# Patient Record
Sex: Male | Born: 1940 | Race: Black or African American | Hispanic: No | Marital: Married | Smoking: Former smoker
Health system: Southern US, Community
[De-identification: ages and names within clinical notes are randomized; demographics above are authoritative.]

## PROBLEM LIST (undated history)

## (undated) DIAGNOSIS — I480 Paroxysmal atrial fibrillation: Secondary | ICD-10-CM

## (undated) DIAGNOSIS — I714 Abdominal aortic aneurysm, without rupture, unspecified: Secondary | ICD-10-CM

## (undated) DIAGNOSIS — I10 Essential (primary) hypertension: Secondary | ICD-10-CM

## (undated) DIAGNOSIS — Z720 Tobacco use: Secondary | ICD-10-CM

## (undated) DIAGNOSIS — I499 Cardiac arrhythmia, unspecified: Secondary | ICD-10-CM

## (undated) DIAGNOSIS — G4733 Obstructive sleep apnea (adult) (pediatric): Secondary | ICD-10-CM

## (undated) DIAGNOSIS — I2510 Atherosclerotic heart disease of native coronary artery without angina pectoris: Secondary | ICD-10-CM

## (undated) DIAGNOSIS — E785 Hyperlipidemia, unspecified: Secondary | ICD-10-CM

## (undated) DIAGNOSIS — M1990 Unspecified osteoarthritis, unspecified site: Secondary | ICD-10-CM

## (undated) HISTORY — DX: Abdominal aortic aneurysm, without rupture: I71.4

## (undated) HISTORY — DX: Hyperlipidemia, unspecified: E78.5

## (undated) HISTORY — DX: Atherosclerotic heart disease of native coronary artery without angina pectoris: I25.10

## (undated) HISTORY — DX: Essential (primary) hypertension: I10

## (undated) HISTORY — DX: Abdominal aortic aneurysm, without rupture, unspecified: I71.40

## (undated) HISTORY — PX: COLONOSCOPY: SHX174

## (undated) HISTORY — DX: Obstructive sleep apnea (adult) (pediatric): G47.33

---

## 2000-03-17 ENCOUNTER — Encounter: Payer: Self-pay | Admitting: Emergency Medicine

## 2000-03-17 ENCOUNTER — Emergency Department (HOSPITAL_COMMUNITY): Admission: EM | Admit: 2000-03-17 | Discharge: 2000-03-17 | Payer: Self-pay | Admitting: Emergency Medicine

## 2001-09-22 ENCOUNTER — Encounter: Payer: Self-pay | Admitting: Emergency Medicine

## 2001-09-22 ENCOUNTER — Emergency Department (HOSPITAL_COMMUNITY): Admission: EM | Admit: 2001-09-22 | Discharge: 2001-09-22 | Payer: Self-pay | Admitting: Emergency Medicine

## 2001-12-12 HISTORY — PX: KNEE ARTHROSCOPY: SUR90

## 2002-11-25 ENCOUNTER — Encounter: Payer: Self-pay | Admitting: Internal Medicine

## 2002-11-25 ENCOUNTER — Encounter: Admission: RE | Admit: 2002-11-25 | Discharge: 2002-11-25 | Payer: Self-pay | Admitting: Internal Medicine

## 2003-06-18 ENCOUNTER — Ambulatory Visit (HOSPITAL_COMMUNITY): Admission: RE | Admit: 2003-06-18 | Discharge: 2003-06-18 | Payer: Self-pay | Admitting: *Deleted

## 2003-06-18 ENCOUNTER — Encounter (INDEPENDENT_AMBULATORY_CARE_PROVIDER_SITE_OTHER): Payer: Self-pay | Admitting: Specialist

## 2007-11-24 ENCOUNTER — Emergency Department (HOSPITAL_COMMUNITY): Admission: EM | Admit: 2007-11-24 | Discharge: 2007-11-24 | Payer: Self-pay | Admitting: Emergency Medicine

## 2007-11-30 ENCOUNTER — Encounter: Admission: RE | Admit: 2007-11-30 | Discharge: 2007-11-30 | Payer: Self-pay | Admitting: Neurology

## 2008-04-09 ENCOUNTER — Encounter: Admission: RE | Admit: 2008-04-09 | Discharge: 2008-04-09 | Payer: Self-pay | Admitting: Internal Medicine

## 2009-01-27 ENCOUNTER — Ambulatory Visit: Payer: Self-pay | Admitting: Vascular Surgery

## 2009-02-03 ENCOUNTER — Encounter: Payer: Self-pay | Admitting: Cardiovascular Disease

## 2009-02-09 ENCOUNTER — Inpatient Hospital Stay (HOSPITAL_BASED_OUTPATIENT_CLINIC_OR_DEPARTMENT_OTHER): Admission: RE | Admit: 2009-02-09 | Discharge: 2009-02-09 | Payer: Self-pay | Admitting: Cardiovascular Disease

## 2009-02-10 ENCOUNTER — Ambulatory Visit: Payer: Self-pay | Admitting: Vascular Surgery

## 2009-02-19 ENCOUNTER — Inpatient Hospital Stay (HOSPITAL_COMMUNITY): Admission: RE | Admit: 2009-02-19 | Discharge: 2009-02-20 | Payer: Self-pay | Admitting: Vascular Surgery

## 2009-02-19 HISTORY — PX: ABDOMINAL AORTIC ANEURYSM REPAIR: SUR1152

## 2009-02-20 ENCOUNTER — Ambulatory Visit: Payer: Self-pay | Admitting: Vascular Surgery

## 2009-02-20 ENCOUNTER — Encounter: Payer: Self-pay | Admitting: Vascular Surgery

## 2009-02-26 ENCOUNTER — Ambulatory Visit: Payer: Self-pay | Admitting: Vascular Surgery

## 2009-03-17 ENCOUNTER — Encounter: Admission: RE | Admit: 2009-03-17 | Discharge: 2009-03-17 | Payer: Self-pay | Admitting: Vascular Surgery

## 2009-03-23 ENCOUNTER — Ambulatory Visit: Payer: Self-pay | Admitting: Vascular Surgery

## 2009-05-27 ENCOUNTER — Encounter: Admission: RE | Admit: 2009-05-27 | Discharge: 2009-05-27 | Payer: Self-pay | Admitting: Internal Medicine

## 2009-09-22 ENCOUNTER — Ambulatory Visit: Payer: Self-pay | Admitting: Vascular Surgery

## 2010-04-06 ENCOUNTER — Ambulatory Visit: Payer: Self-pay | Admitting: Vascular Surgery

## 2010-04-06 ENCOUNTER — Encounter: Admission: RE | Admit: 2010-04-06 | Discharge: 2010-04-06 | Payer: Self-pay | Admitting: Vascular Surgery

## 2011-01-02 ENCOUNTER — Encounter: Payer: Self-pay | Admitting: Vascular Surgery

## 2011-03-15 ENCOUNTER — Encounter (INDEPENDENT_AMBULATORY_CARE_PROVIDER_SITE_OTHER): Payer: 59

## 2011-03-15 ENCOUNTER — Ambulatory Visit (INDEPENDENT_AMBULATORY_CARE_PROVIDER_SITE_OTHER): Payer: 59 | Admitting: Vascular Surgery

## 2011-03-15 DIAGNOSIS — I714 Abdominal aortic aneurysm, without rupture, unspecified: Secondary | ICD-10-CM

## 2011-03-15 DIAGNOSIS — Z48812 Encounter for surgical aftercare following surgery on the circulatory system: Secondary | ICD-10-CM

## 2011-03-15 NOTE — Assessment & Plan Note (Signed)
OFFICE VISIT  Rodney [name], Rodney [name] DOB:  [birth year]                                       03/15/2011 IEPPI#:[id number]  Patient returns for his annual checkup regarding his previous aortic stent graft repair from an abdominal aortic aneurysm performed by me in 2010 with a Gore endograft.  He has done very well with no complications or specific problems.  He denies any abdominal or back symptoms.  He also denies any active cardiac symptoms such as chest pain, dyspnea on exertion, PND, or orthopnea.  CHRONIC MEDICAL PROBLEMS: 1. Hypertension. 2. Hyperlipidemia. 3. Coronary artery disease with minor blockage previously followed by     Dr. Elease Hashimoto following a cardiac cath but no intervention has been     necessary. 4. Negative for diabetes, COPD, or stroke.  SOCIAL HISTORY:  Married, has 1 child.  Continues to work at [employer].  Smokes a 1/2 pack to 1 pack of cigarettes per day for 50 years.  Does not use alcohol.  REVIEW OF SYSTEMS:  Positive for occasional anorexia.  He denies chest pain, dyspnea on exertion, PND, orthopnea.  No claudication.  All other systems in the complete review of systems are negative.  PHYSICAL EXAMINATION:  Blood pressure 151/93, heart rate 60, respirations 20.  General:  He is a well-developed and well-nourished male in no apparent distress, alert and oriented x3.  HEENT:  Normal for age.  EOMs intact.  Lungs:  Clear to auscultation.  No rhonchi or wheezing.  Cardiovascular:  Regular rhythm.  No murmurs.  Carotid pulses 3+.  No audible bruits.  Abdomen:  Soft, nontender.  No pulsatile mass noted.  Lower extremity exam reveals 3+ femoral and 2+ dorsalis pedis pulses bilaterally.  Musculoskeletal exam is free of major deformities.  Today I reviewed a duplex scan of his endograft, which I ordered and interpreted.  The size of the aortic sac is unchanged from 1 year ago. There is no evidence of any endoleak or  migration.  I have reassured him regarding these findings.  Will see him back in 1 year.  He does have a questionable contrast allergy, so we will continue to follow this with duplex scanning in the office on an annual basis.    [name] Hart Rochester, M.D. Electronically Signed  JDL/MEDQ  D:  03/15/2011  T:  03/15/2011  Job:  [id number]

## 2011-03-22 NOTE — Procedures (Unsigned)
VASCULAR LAB EXAM  INDICATION:  Follow up AAA endograft, placed 02/19/2009  HISTORY: Diabetes:  No. Cardiac:  No. Hypertension:  Yes.  EXAM: 1. AAA sac size 3.97 cm AP, 4.38 cm transverse. 2. Previous sac size 09/22/2009 4.1 cm AP, 5.02 cm transverse.  IMPRESSION: 1. The aorta and endograft appear patent. 2. No significant change in size of the aneurysmal sac surrounding the     endograft. 3. No evidence of endoleak was detected.  ___________________________________________ [name]. Hart Rochester, M.D.  EM/MEDQ  D:  03/15/2011  T:  03/15/2011  Job:  [id number]

## 2011-03-24 LAB — BASIC METABOLIC PANEL
GFR calc non Af Amer: >60 mL/min (ref >60)
Potassium: 3.8 mEq/L (ref 3.5–5.1)
Sodium: 136 mEq/L (ref 135–145)

## 2011-03-24 LAB — CROSSMATCH: Antibody Screen: NEGATIVE

## 2011-03-24 LAB — COMPREHENSIVE METABOLIC PANEL
Albumin: 3.9 g/dL (ref 3.5–5.2)
Alkaline Phosphatase: 72 U/L (ref 39–117)
BUN: 13 mg/dL (ref 6–23)
CO2: 23 mEq/L (ref 19–32)
Chloride: 110 mEq/L (ref 96–112)
GFR calc non Af Amer: >60 mL/min (ref >60)
Glucose, Bld: 106 mg/dL — ABNORMAL HIGH (ref 70–99)
Potassium: 4.3 mEq/L (ref 3.5–5.1)
Total Bilirubin: 0.6 mg/dL (ref 0.3–1.2)

## 2011-03-24 LAB — CBC
HCT: 38.9 % — ABNORMAL LOW (ref 39.0–52.0)
HCT: 46.3 % (ref 39.0–52.0)
Hemoglobin: 13.9 g/dL (ref 13.0–17.0)
Hemoglobin: 16.4 g/dL (ref 13.0–17.0)
MCV: 94.9 fL (ref 78.0–100.0)
RBC: 4.88 MIL/uL (ref 4.22–5.81)
WBC: 4.5 10*3/uL (ref 4.0–10.5)
WBC: 6.9 10*3/uL (ref 4.0–10.5)

## 2011-03-24 LAB — URINALYSIS, ROUTINE W REFLEX MICROSCOPIC
Bilirubin Urine: NEGATIVE
Hgb urine dipstick: NEGATIVE
Ketones, ur: NEGATIVE mg/dL
Specific Gravity, Urine: 1.019 (ref 1.005–1.030)
Urobilinogen, UA: 1 mg/dL (ref 0.0–1.0)

## 2011-03-24 LAB — BLOOD GAS, ARTERIAL
Acid-base deficit: 1.3 mmol/L (ref 0.0–2.0)
FIO2: 0.21 %
TCO2: 23.7 mmol/L (ref 0–100)
pO2, Arterial: 81.3 mmHg (ref 80.0–100.0)

## 2011-03-24 LAB — PROTIME-INR
INR: 1 (ref 0.00–1.49)
Prothrombin Time: 13.1 seconds (ref 11.6–15.2)

## 2011-03-24 LAB — ABO/RH: ABO/RH(D): A NEG

## 2011-04-26 NOTE — Assessment & Plan Note (Signed)
OFFICE VISIT   Rodney [name], Rodney [name]  DOB:  1941/06/12                                       09/22/2009  ZOXWR#:[id number]   The patient returns for continued followup regarding his aortic stent  graft placed in infrarenal abdominal aneurysm March of 2010.  This was  placed in both common iliac arteries.  He did not have a CT scan today  because he does have itching when he obtains contrast material although  he has never had a true allergic reaction with a rash.  His last CT scan  done 1 month post insertion of the graft was also a noncontrast CT.   Today we performed a duplex scan in the office.  There was no evidence  of any endo leak on the duplex scan and the stent graft is in excellent  position with a maximum diameter being 5 cm x 4.1 cm.  This is similar  to a study done 6 months ago.   On examination today his blood pressure is 138/75, heart rate 57,  respirations 14.  Carotid pulses are 3+, no audible bruits.  Neurologic  exam is normal.  Abdomen soft, nontender with no pulsatile mass noted.  He has 3+ femoral and popliteal pulses bilaterally.  Well-perfused lower  extremities.  ABIs 0.92 on the left and 0.74 on the right.   He was reassured regarding these findings and we will see him back in 6  months with a noncontrast CT scan.  Following that we will alternate  years performing duplex scan and CT scans without contrast.   [name]. Hart Rochester, M.D.  Electronically Signed   JDL/MEDQ  D:  09/22/2009  T:  09/23/2009  Job:  [id number]

## 2011-04-26 NOTE — H&P (Signed)
NAME:  DANNER, PAULDING NO.:  [id number]   MEDICAL RECORD NO.:  [id number]            PATIENT TYPE:   LOCATION:                                 FACILITY:   PHYSICIAN:  Vesta Mixer, M.D. DATE OF BIRTH:  July 11, 1941   DATE OF ADMISSION:  DATE OF DISCHARGE:                              HISTORY & PHYSICAL   Rodney [name] is a 70 year old gentleman with a history of hypertension,  hypercholesterolemia, and abdominal aortic aneurysm.  He is now referred  for heart catheterization after having an abnormal Cardiolite study.   Rodney [name] has a history of hypertension and hyperlipidemia.  He has been  having some chest discomfort, but typically this is after he eats.  He  has also been having some pain that radiates up to the left side of his  neck.  He has intermittent episodes of chest discomfort without any  specific precipitating factors.  These pains last 3-4 minutes.  They are  not associated with shortness breath, nausea, vomiting, or diaphoresis.  He denies any syncope or presyncope.  He denies any PND or orthopnea.   He was recently seen by Dr. Hart Rochester and was found to have an abdominal  aortic aneurysm and we are asked to see him for preoperative evaluation.  He had a stress Cardiolite study which was abnormal.   His current medications are:  1. Procardia XL 30 mg a day.  2. Crestor 10 mg a day.  3. Aspirin 81 mg a day.  4. Multivitamin once a day.  5. Omega-3 fatty acids once a day.   ALLERGIES:  None.   PAST MEDICAL HISTORY:  1. Hypercholesterolemia.  2. Hypertension.  3. Abdominal aortic aneurysm.   SOCIAL HISTORY:  The patient works in [place].  He smokes 1 pack a day  for 40 years.  He plays golf and walks occasionally.  He drinks a beer  every day.   FAMILY HISTORY:  His father died at age 40 due to prostate cancer.  His  mother is 53 years old and has Alzheimer disease.   His review of systems is reviewed in the history of present illness.  All other systems were reviewed and are negative.   PHYSICAL EXAMINATION:  GENERAL:  He is a middle-aged gentleman in no  acute distress.  He is alert and oriented x3 and his mood and affect are  normal.  VITAL SIGNS:  His weight is 204.  Blood pressure is 128/74 with a heart  rate of 68.  HEENT:  2+ carotids.  He has no bruits, no JVD, no thyromegaly.  NECK:  Supple.  BACK:  Nontender.  LUNGS:  Clear.  HEART:  Regular rate.  S1 and S2.  His PMI is nondisplaced.  His chest  wall is nontender.  ABDOMEN:  Good bowel sounds.  He does have a pulsatile mass.  I did not  hear any bruits.  There is no hepatosplenomegaly.  He has no guarding or  rebound.  EXTREMITIES:  He has no clubbing, cyanosis, or edema.  His pulses are  2+.  SKIN:  Warm and dry without rashes.  NEURO:  Cranial nerves II-XII are intact, and his motor and sensory  function are intact.  His gait is normal.   His EKG reveals normal sinus rhythm.  He has voltage for left  ventricular hypertrophy.  He has nonspecific ST and T wave changes.   A stress Cardiolite study reveals inferior defect consistent with  possible inferior scar.  He also has mild to moderate left ventricular  dysfunction.   Rodney [name] presents with a history of peripheral vascular disease.  He has  moderately reduced left ventricular systolic function.  We have  discussed the heart catheterization.  We have discussed the risks,  benefits, and options of heart catheterization.  He understands and  agrees to proceed.      Vesta Mixer, M.D.  Electronically Signed     PJN/MEDQ  D:  02/05/2009  T:  02/06/2009  Job:  [id number]   cc:   Antony Madura, M.D.  [name] Hart Rochester, M.D.

## 2011-04-26 NOTE — Op Note (Signed)
NAME:  FRANCK, VINAL NO.:  [id number]   MEDICAL RECORD NO.:  [id number]          PATIENT TYPE:  INP   LOCATION:  3308                         FACILITY:  [facility]   PHYSICIAN:  Juleen China IV, MDDATE OF BIRTH:  1941-06-29   DATE OF PROCEDURE:  02/19/2009  DATE OF DISCHARGE:                               OPERATIVE REPORT   PREOPERATIVE DIAGNOSIS:  Abdominal aortic aneurysm.   POSTOPERATIVE DIAGNOSIS:  Abdominal aortic aneurysm.   PROCEDURE PERFORMED:  Endovascular aneurysm repair, right femoral artery  exposure.   ANESTHESIA:  General.   BLOOD LOSS:  Minimal.   FINDINGS:  Complete exclusion.   INDICATIONS:  This is a 70 year old gentleman with infrarenal abdominal  aortic aneurysm who comes in today for stent graft placement.  Please  see Dr. Candie Chroman note for full details of the procedure.  This will be  operative note for the right femoral artery dissection.   PROCEDURE:  The patient was identified in the holding and taken to room  9.  He was placed supine on the table.  The patient was prepped and  draped in standard sterile fashion.  General endotracheal anesthesia was  administered.  A time-out was called.  Antibiotics were given.  Again,  this is a dictation for the right femoral artery exposure.  The inguinal  ligament was identified by bony landmarks.  An oblique incision was made  over the palpable pulse of the common femoral artery below the inguinal  ligament.  Cautery was used to dissect the subcutaneous tissue.  Femoral  sheath was identified and opened sharply.  The right femoral artery was  mobilized up to the level of inguinal ligament and down to the  bifurcation.  Vessel loops were placed proximally and distally.   Please see Dr. Candie Chroman note for details of the aneurysm repair.   Once the aneurysm repair had been completed, the sheaths and wires were  removed.  A Henley clamp was placed proximally on the right common  femoral artery  and a baby Earl Lites was placed distally.  The artery was  irrigated with heparinized saline.  There was a large plaque which I  made sure that I packed out with closure of the arteriotomy which was  done with a 5-0 Prolene in running fashion.  The artery was flushed in  antegrade and retrograde fashion prior to completion of the arteriotomy  closure.  The patient's heparin was reversed with protamine.   The groin was irrigated.  Hemostasis was achieved.  The femoral sheath  was reapproximated with 2-0 Vicryl, subcutaneous tissue was closed with  2-0 and 3-0 Vicryl, and skin was closed with a 4-0 Vicryl.  The patient  tolerated the procedure well, and there were no complications.           ______________________________  V. Charlena Cross, MD  Electronically Signed     VWB/MEDQ  D:  02/19/2009  T:  02/20/2009  Job:  [id number]

## 2011-04-26 NOTE — Assessment & Plan Note (Signed)
OFFICE VISIT   MARSH, HECKLER  DOB:  08/27/1941                                       02/10/2009  EAVWU#:[id number]   The patient returns today for further discussion regarding his abdominal  aortic aneurysm.  He had a cardiac cath performed by Dr. Kristeen Miss  yesterday and I discussed this with Dr. Elease Hashimoto.  He has some occlusive  disease in his ramus intermedius branch which may eventually require  PTCA and stenting.  It is Dr. Harvie Bridge opinion that this should not  place him in jeopardy during his aortic stent graft procedure and he  feels and I agree that we should proceed with the aortic stent graft.  Dr. Elease Hashimoto will then see the patient back in 2 months for further  followup.  The CT scan has been reviewed and we feel that he is a good  candidate for aortic stent grafting and will proceed with that on  Thursday, March 11 at [facility].  Blood pressure today is 150/80,  heart rate 62.  Abdomen soft, nontender with a prominent pulsatile mass.  He has 3+ femoral, popliteal and dorsalis pedis pulses bilaterally.  His  questions were answered and he would like to proceed.  Risks were  discussed.   [name] Hart Rochester, M.D.  Electronically Signed   JDL/MEDQ  D:  02/10/2009  T:  02/11/2009  Job:  [id number]

## 2011-04-26 NOTE — Procedures (Signed)
ENDOVASCULAR STENT GRAFT EXAM   INDICATION:  Followup endo stent.   HISTORY:  Abdominal aortic aneurysm.                           DUPLEX EVALUATION   AAA Sac Size:                 4.1 CM AP             5.02 CM TRV  Previous Sac Size:            5.0 CM AP             4.7 CM TRV  Evidence of an endoleak?      No                    No   Velocity Criteria:  Proximal Aorta                60 cm/sec  Proximal Stent Graft          39 cm/sec  Main Body Stent Graft-Mid     65 cm/sec  Right Limb-Proximal           76 cm/sec  Right Limb-Distal             71 cm/sec  Left Limb-Proximal            81 cm/sec  Left Limb-Distal              90 cm/sec  Patent Renal Arteries?        Yes                   Yes   IMPRESSION:  1. No evidence of endo leak.  2. Patent endo stent with no evidence of focal stenosis.   ___________________________________________  [name] Hart Rochester, M.D.   MG/MEDQ  D:  09/23/2009  T:  09/23/2009  Job:  [id number]

## 2011-04-26 NOTE — Op Note (Signed)
NAME:  ABDULMALIK, Rodney [name] NO.:  [id number]   MEDICAL RECORD NO.:  [id number]          PATIENT TYPE:  INP   LOCATION:  3308                         FACILITY:  [facility]   PHYSICIAN:  [name]. Hart Rochester, M.D.  DATE OF BIRTH:  September 09, 1941   DATE OF PROCEDURE:  02/19/2009  DATE OF DISCHARGE:                               OPERATIVE REPORT   PREOPERATIVE DIAGNOSIS:  Infrarenal abdominal aortic aneurysm.   POSTOPERATIVE DIAGNOSIS:  Infrarenal abdominal aortic aneurysm.   OPERATION:  Insertion of aorto-bi-common iliac Gore - Excluder stent  graft for abdominal aortic aneurysm via bilateral common femoral artery  approach.  1. Left femoral artery exposure by Dr. Hart Rochester, right femoral artery      exposure by Dr. Myra Gianotti will be dictated by him with insertion of;      a.     28.5 x 14 x 16 cm main body via left.      b.     20 mm x 11.5 cm contralateral limb via the right.      c.     Abdominal aortogram and pelvic angiogram with completion       arteriogram.   SURGEONS:  [name]. Hart Rochester, MD and V. Durene Cal IV, MD   ANESTHESIA:  General endotracheal.   PROCEDURE:  The patient was taken to the operating room and placed in  supine position at which time satisfactory general endotracheal  anesthesia was administered.  Abdomen and groins were prepped with  Betadine scrub and solution and draped in routine sterile manner.  Suprainguinal oblique incisions were made bilaterally in the common  superficial and profunda femoris arteries, dissected free.  Both had  excellent pulses.  The left common femoral artery was entered initially  with a short 8-French sheath passed over a Bentson wire and a pigtail  catheter inserted proximal to the renal arteries where abdominal  aortogram and pelvic angiogram was obtained to localize the level of the  renal arteries and to determine the length necessary for the graft.  Right femoral artery had also been entered with a long 8 sheath being  inserted.  After completing the angiogram, a 28.5 x 14 mm x 16 cm graft  was selected and inserted via the left femoral site.  It was then  deployed after localizing the level of the renal arteries.  Following  deployment of this, the contralateral gate was cannulated via the right  common femoral approach using a Motarjeme catheter and a Glidewire.  When this had been completed, retrograde angiogram performed through the  right iliac artery to determine the length on the right and a 20 mm x  11.5 cm (bell bottom) graft was deployed to the distal right common  iliac artery without difficulty.  All of the junctions were then dilated  with a CODA balloon catheter and a completion angiogram was performed  through the pigtail catheter, revealing no evidence of the Endo leak and  good position of the graft.  Following this, sheaths and catheters were  all removed over guidewires.  Both femoral arteries were repaired with 6-  0 Prolene, Dr. Myra Gianotti on the right and Dr. Hart Rochester on the left,  following  adequate hemostasis and administration of protamine to reverse the  heparin.  The wounds were closed in layers with Vicryl in subcuticular  fashion.  Sterile dressing was applied.  The patient was taken to  recovery room in satisfactory condition.      [name] Hart Rochester, M.D.  Electronically Signed     JDL/MEDQ  D:  02/19/2009  T:  02/19/2009  Job:  [id number]

## 2011-04-26 NOTE — Assessment & Plan Note (Signed)
OFFICE VISIT   TYLEEK, SMICK  DOB:  1941-04-25                                       03/23/2009  [id number]#:[id number]   Mr. Rodney [name] is 1 month status post endovascular repair of an infrarenal  abdominal aortic aneurysm using a Gore excluder graft.  He has done very  well with no complications and no specific complaints.  He has not  required any prescription pain medication and is resuming his normal  activities.  He is scheduled to return to work next week.  He has had no  abdominal or back symptoms.  Appetite has returned to normal.   PHYSICAL EXAM:  Vital signs:  Today blood pressure is 130/76, heart rate  68, respirations 14.  His inguinal incisions have healed nicely.  He has excellent femoral and  popliteal pulses bilaterally with well-perfused lower extremities.  An  ABI of 0.8 on the right and 1.0 on the left.  No pulsatile masses noted in his abdomen.   CT angiogram performed today was reviewed by me and there is no evidence  of any endoleak in the graft.  It has not migrated or changed positions.  Aneurysm is approximately 5 cm in diameter and not pulsatile.   I have reassured him regarding these findings and he will continue to  take one aspirin per day and return to see me in 6 months with a follow-  up CT angiogram at that time unless he has any problems in the interim.   [name] Hart Rochester, M.D.  Electronically Signed   JDL/MEDQ  D:  03/23/2009  T:  03/24/2009  Job:  [id number]   cc:   Antony Madura, M.D.  Maretta Bees. Vonita Moss, M.D.  Vesta Mixer, M.D.

## 2011-04-26 NOTE — Assessment & Plan Note (Signed)
OFFICE VISIT   Rodney [name], Rodney [name]  DOB:  12-29-1940                                       04/06/2010  [id number]#:[id number]   The patient returns for continued followup regarding his aortic stent  graft repair which I performed in March of 2010 for an infrarenal  abdominal aortic aneurysm.  He has an aortobicommon iliac Gore excluder  graft.  He does have a questionable allergy to contrast therefore his CT  scans are done without contrast because of itching which occurs.  He has  had no abdominal or back symptoms since I last saw him 6 months ago.  He  denies any chest pain, dyspnea on exertion, PND, orthopnea, claudication  symptoms or other specific problems.   SOCIAL HISTORY:  He is married, has one child, works for [employer] and smokes half pack of cigarettes per day.   PHYSICAL EXAMINATION:  Vital signs:  Blood pressure 171/99, heart rate  63, respirations 18.  General:  He is a well-developed, well-nourished  male who is alert and oriented x3.  No apparent distress.  Chest:  Clear  to auscultation.  Abdomen:  Soft, nontender with no pulsatile mass  noted.  He has 3+ femoral and dorsalis pedis pulses bilaterally with no  evidence of ischemia.   Today I ordered a CT scan noncontrast which I reviewed and it does  reveal that the aneurysm sac continues to contract down and now measures  4 cm in maximum diameter.  No evidence of migration or endo leak or  fracture.  This is smaller than the size last noted on the duplex scan 6  months ago.  He will return in 1 year with duplex scan in our office and  we will alternate annual CT scans with duplex scans to continue to  monitor this aneurysm sac.     [name] Hart Rochester, M.D.  Electronically Signed   JDL/MEDQ  D:  04/06/2010  T:  04/07/2010  Job:  [id number]

## 2011-04-26 NOTE — Discharge Summary (Signed)
NAME:  Rodney [name], Rodney [name] NO.:  [id number]   MEDICAL RECORD NO.:  [id number]          PATIENT TYPE:  INP   LOCATION:  3308                         FACILITY:  [facility]   PHYSICIAN:  [name]. Hart Rochester, M.D.  DATE OF BIRTH:  1941/12/07   DATE OF ADMISSION:  02/19/2009  DATE OF DISCHARGE:  02/20/2009                               DISCHARGE SUMMARY   FINAL DISCHARGE DIAGNOSES:  1. Abdominal aortic aneurysm.  2. Hypertension.  3. Hyperlipidemia.  4. History of gout.   PROCEDURES PERFORMED:  Endovascular repair of a aortic abdominal  aneurysm using a Gore excluder stent graft, main body 28.5 x 14 x 16  with a contralateral leg of 20 mm x 11.5 cm.   COMPLICATIONS:  None.   DISCHARGE MEDICATIONS:  He is instructed to resume all preoperative  medications consisting of:  1. Crestor 10 mg p.o. q.a.m.  2. Multivitamin with iron p.o. daily.  3. Aspirin 81 mg p.o. daily.  4. Procardia XL 30 mg p.o. daily.  5. He is given a prescription for Percocet 5/325 one p.o. q.4 h.      p.r.n. pain, total number of 20 were given.   DISPOSITION:  He is being discharged home in stable condition with his  wounds healing well.  He is given careful instructions regarding the  care of his wounds and his activity level.  He is given an appointment  to see Dr.  Hart Rochester in 4 weeks with CTA of his abdomen and pelvis with  stent graft protocol and ABIs.  The office will arrange visits.   BRIEF IDENTIFYING STATEMENT:  For complete details, please refer to the  typed history and physical.  Briefly, this is a very pleasant 70-year-  old man who was referred to Dr. Hart Rochester with an infrarenal abdominal  aortic aneurysm measuring 5 cm in maximum diameter.  Dr. Hart Rochester  evaluated him and found him to be a suitable candidate for endovascular  repair.  He was informed of the risks and benefits of the procedure and  after careful consideration, he elected to proceed with surgery.   HOSPITAL COURSE:   Preoperative workup was completed as an outpatient.  He was brought in through same day of surgery and underwent the  aforementioned repair of his abdominal aortic aneurysm.  For complete  details, please refer the typed operative report.  Procedure was without  complications.  He was returned to the postanesthesia care unit  extubated.  Following stabilization, he was transferred to a surgical  step-down unit.  He was observed overnight.  On the following morning, he was found to be stable.  His wounds are  healing well.  At this time, his A-lines and cordis were removed.  His  Foley was removed.  Following ambulation and voiding trial, he was  discharged home in stable condition.      Wilmon Arms, PA      [name] Hart Rochester, M.D.  Electronically Signed    KEL/MEDQ  D:  02/20/2009  T:  02/20/2009  Job:  [id number]   cc:   [name]. Hart Rochester,  M.D. 

## 2011-04-26 NOTE — H&P (Signed)
HISTORY AND PHYSICAL EXAMINATION   January 27, 2009   Re:  Rodney [name], Rodney [name]                DOB:  02/07/1941   CHIEF COMPLAINT:  Infrarenal abdominal aortic aneurysm.   HISTORY OF PRESENT ILLNESS:  This healthy 70 year old active male  patient was found to have microscopic hematuria and was evaluated by Dr.  Vonita Moss with a CT of the abdomen and pelvis.  The hematuria resolved  and he was found to have an infrarenal abdominal aortic aneurysm  measuring 5 cm in maximum diameter and referred for further evaluation.  After discussion he was scheduled for repair of the aneurysm after  cardiac evaluation.   PAST MEDICAL HISTORY:  1. Hypertension.  2. Hyperlipidemia.  3. Gout.  4. Negative for diabetes, coronary artery disease, stroke.   PAST SURGICAL HISTORY:  Left knee arthroscopy.   FAMILY HISTORY:  Positive for prostate cancer in his father at an  elderly age, diabetes in his sister.  Negative for coronary artery  disease and stroke.   SOCIAL HISTORY:  He is married and continues to work at [employer].  He smokes a pack of cigarettes per day and has done so for 50+  years.  He drinks an occasional beer.   REVIEW OF SYSTEMS:  Positive for weight loss.  He has never had history  of chest pain, dyspnea on exertion, PND, orthopnea, claudication.  No GI  or GU symptoms at the present time.  Occasional headaches.   ALLERGIES:  None known.   MEDICATIONS:  1. Crestor 10 mg daily.  2. Procardia XL 30 mg daily.  3. Aspirin 81 mg daily.  4. Men's 50 Plus Advantage one daily.  5. Omega-3 1000 mg one daily.   PHYSICAL EXAMINATION:  Vital signs:  Blood pressure 146/84, heart rate  67, respirations 14.  General:  He is alert and oriented x3.  Neck:  Supple.  3+ carotid pulses palpable.  No bruits are audible.  Neurologic:  Normal.  No palpable adenopathy in the neck.  Chest:  Clear  to auscultation.  Cardiovascular:  Regular rhythm.  No murmurs.  Abdomen:   Soft, nontender with a prominent pulsatile mass.  He has 3+  femoral, popliteal and dorsalis pedis pulses bilaterally.   IMPRESSION:  Infrarenal abdominal aortic aneurysm.  The plan is to admit  the patient on March 4 for stent graft repair of this infrarenal aortic  aneurysm.  He will undergo a Cardiolite exam prior to admission for  cardiac clearance.  Risks and benefits of the procedure have been  thoroughly discussed with the patient and he would like to proceed.   [name] Hart Rochester, M.D.  Electronically Signed   JDL/MEDQ  D:  01/27/2009  T:  01/28/2009  Job:  [id number]   cc:   Maretta Bees. Vonita Moss, M.D.  Antony Madura, M.D.

## 2011-04-26 NOTE — Cardiovascular Report (Signed)
NAME:  Rodney, [name] NO.:  [id number]   MEDICAL RECORD NO.:  [id number]           PATIENT TYPE:   LOCATION:                                 FACILITY:   PHYSICIAN:  Rodney [name], M.D. DATE OF BIRTH:  [birth year]   DATE OF PROCEDURE:  DATE OF DISCHARGE:                            CARDIAC CATHETERIZATION   Rodney [name] is a 70 year old gentleman with a history of hypertension,  hypercholesterolemia, and an abdominal aortic aneurysm.  He was recently  referred to the office for stress Cardiolite study, which was found to  be abnormal.  He is referred for heart catheterization for further  evaluation of that.   The procedure was left heart catheterization with coronary angiography  and left ventriculogram.  He also had an ascending aorta angiogram  because of the dilated aortic root.   The right femoral artery was easily cannulated using a modified  Seldinger technique.   HEMODYNAMICS:  LV pressure is 142/16 with an aortic pressure of 139/76.   ANGIOGRAPHY:  Left main:  The left main is fairly normal.   The left anterior descending artery has some proximal luminal  irregularities between 20 and 30%.  The mid vessel stenosis around 30-  40%.  It gives off several small diagonal arteries.  The first diagonal  artery is moderate in size and has mild irregularities.  The second  diagonal artery is very tiny and has an 80% proximal stenosis.  This  vessel is quite small and is not a candidate for PTCA.   The remaining LAD has only minor luminal irregularities.   The left circumflex artery is a large vessel.  It gives off a very high  obtuse marginal artery.  The remaining circumflex artery continues  around the A-V groove and supplies a posterolateral branch.   The first obtuse marginal artery is subtotally occluded at 2 different  sites.  There was sluggish TIMI grade 1 flow through this vessel.  This  vessel reaches around the lateral wall and supplies a  small amount of  the inferior left ventricle.  This was quite likely where the site of  the abnormal Cardiolite.   The right coronary artery is large and dominant.  There are minor  luminal irregularities.  The posterior descending artery and the  posterolateral segment artery, they taper fairly quickly at the takeoff.  This taper improved slightly with intracoronary nitroglycerin.  There  are only minor luminal irregularities between 20 and 30%.   The left ventriculogram was performed in a 30-RAO position.  It reveals  a moderately dilated and hypocontractile left ventricle.  The ejection  fraction is probably 40%.   During this left ventriculogram, we noticed that he had a very dilated  aortic root.  We performed an aortic root angiography, which revealed a  dilated aortic root.  There was no evidence of aortic insufficiency.   COMPLICATIONS:  None.   CONCLUSIONS:  1. Single-vessel coronary artery disease, primarily involving a tight      obtuse marginal artery #1.  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  He is basically asymptomatic, and I do not think that      this will present any problems for his aortic aneurysm repair.  I      have discussed the case with Dr. Hart [name] and felt that Rodney [name] is      probably a very good candidate to have an aortic stent graft.  He      may have a slight rise in his cardiac enzymes if he does have to go      under general anesthetic, but I do not think that it will cause      significant worsening of his heart failure.  Once he has had his      aneurysm successfully treated, we can consider going in and trying      to stent this if he has any other symptoms.  We will continue with      risk factor modification.      Rodney [name], M.D.  Electronically Signed     PJN/MEDQ  D:  02/09/2009  T:  02/09/2009  Job:  [id number]   cc:   Rodney [name]. Rodney [name], M.D.  Rodney [name], M.D.

## 2011-04-29 NOTE — Consult Note (Signed)
[place]  Patient:    Rodney [name], Rodney [name] Visit Number: [id number] MRN: [id number]          Service Type: EMS Location: [place] Attending Physician:  Doug Sou Dictated by:   Aundra Dubin, M.D. Proc. Date: 09/22/01 Admit Date:  09/22/2001   CC:         Antony Madura, M.D.   Consultation Report  CHIEF COMPLAINT:  Shortness of breath.  HISTORY OF PRESENT ILLNESS:  Mr. Rodney [name] is a 70 year old black male who is a patient of Dr. Antony Madura who is a long-term smoker.  This afternoon, with slight exertion, he became short of breath.  He had some "discomfort" to his chest but he said there was no pain.  There may have been a slight degree of nausea.  This cleared in a couple of minutes.  His family members wanted him seen in the emergency room.  So, he came to the emergency room.  He generally has been doing well.  His wife says his energy level has been the same.  He has had some of these episodes of discomfort with slight burning to the chest over the last six months.  He generally smokes a pack of cigarettes a day and does not drink alcohol.  On further review of systems, there has been no edema but his feet have felt slightly tight occasionally.  Todays episodes, he had no radiation to the neck or arms.  There was no diaphoresis.  He is not diabetic.  Other review of systems finds there was no joint swelling, rashes, fever, or cough.  PAST MEDICAL/SURGICAL HISTORY:  Hypertension.  Left knee surgery.  No recent hospitalization.  MEDICATIONS: 1. Procardia, questionable mg q.d. 2. Aspirin 325 mg q.d.  FAMILY HISTORY:  Noncontributory.  SOCIAL HISTORY:  He is here with his wife.  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  He received a business degree at [employer].  He was in the [employer] for 10 years.  He began smoking at about age 12.  He does not drink alcohol.  PHYSICAL  EXAMINATION:  VITAL SIGNS:  Blood pressure 166/87, pulse 62, respirations 20.  Blood pressure later was 154/84 and 143/78.  GENERAL:  He is in no distress.  SKIN:  Dry.  HEENT:  PERRL.  EOMI.  Mouth clear.  NECK:  Negative JVD.  LUNGS:  Clear.  HEART:  Regular.  No murmur.  ABDOMEN:  Negative HSM nontender.  MUSCULOSKELETAL:  The hands, wrists, elbows, shoulders, knees, ankles, and feet and had no swelling.  There is no lower extremity edema.  NEUROLOGICAL:  Nonfocal.  LABORATORY DATA:  His CPK is 195 (24-195).  The troponin was 0.  The EKG showed sinus bradycardia and some slight nonspecific T-wave changes. Review of his chest x-ray, which was clear, and there was no change from previous x-rays.  Other labs showed a WBC 4.9, hemoglobin 15.3.  Glucose 101, creatinine 1.1, albumin 4.0, AST 29.  ASSESSMENT/PLAN:  Possible angina:  I believe he needs a cardiac workup.  I do believe that he is stable to go home.  I will have him take an aspirin tonight and give him a prescription for sublingual nitroglycerin.  I am also placing him on metoprolol 25 mg q.d. in addition to his Procardia.  He will contact Dr. Antony Madura on September 24, 2001.  He will likely need a stress test. If he  has further difficulties, he will return to the emergency room. Dictated by:   Aundra Dubin, M.D. Attending Physician:  Doug Sou DD:  09/22/01 TD:  09/23/01 Job: [id number]

## 2011-08-02 ENCOUNTER — Other Ambulatory Visit: Payer: Self-pay | Admitting: Internal Medicine

## 2011-08-02 ENCOUNTER — Ambulatory Visit
Admission: RE | Admit: 2011-08-02 | Discharge: 2011-08-02 | Disposition: A | Discharge status: Home / Self Care | Payer: 59 | Source: Ambulatory Visit | Attending: Internal Medicine | Admitting: Internal Medicine

## 2011-08-02 DIAGNOSIS — M25519 Pain in unspecified shoulder: Secondary | ICD-10-CM

## 2011-09-19 LAB — CBC
HCT: 44.3
Hemoglobin: 15.5
MCHC: 35
MCV: 94.3
RDW: 12.9

## 2011-09-19 LAB — DIFFERENTIAL
Basophils Absolute: 0
Basophils Relative: 1
Eosinophils Relative: 2
Monocytes Absolute: 0.3

## 2011-09-19 LAB — I-STAT 8, (EC8 V) (CONVERTED LAB)
BUN: 13
Bicarbonate: 27.1 — ABNORMAL HIGH
Glucose, Bld: 83
pCO2, Ven: 49.2
pH, Ven: 7.349 — ABNORMAL HIGH

## 2012-03-14 ENCOUNTER — Encounter: Payer: Self-pay | Admitting: Vascular Surgery

## 2012-03-19 ENCOUNTER — Encounter: Payer: Self-pay | Admitting: Neurosurgery

## 2012-03-20 ENCOUNTER — Ambulatory Visit: Payer: 59 | Admitting: Neurosurgery

## 2012-07-02 ENCOUNTER — Encounter: Payer: Self-pay | Admitting: Vascular Surgery

## 2012-07-03 ENCOUNTER — Ambulatory Visit (INDEPENDENT_AMBULATORY_CARE_PROVIDER_SITE_OTHER): Payer: Medicare Other | Admitting: Vascular Surgery

## 2012-07-03 ENCOUNTER — Encounter: Payer: Self-pay | Admitting: Vascular Surgery

## 2012-07-03 VITALS — BP 150/88 | HR 64 | Temp 98.1°F | Ht 74.0 in | Wt 206.0 lb

## 2012-07-03 DIAGNOSIS — I714 Abdominal aortic aneurysm, without rupture, unspecified: Secondary | ICD-10-CM

## 2012-07-03 DIAGNOSIS — Z48812 Encounter for surgical aftercare following surgery on the circulatory system: Secondary | ICD-10-CM

## 2012-07-03 DIAGNOSIS — Z8679 Personal history of other diseases of the circulatory system: Secondary | ICD-10-CM | POA: Insufficient documentation

## 2012-07-03 NOTE — Addendum Note (Signed)
Addended by: Sharee Pimple on: 07/03/2012 11:06 AM   Modules accepted: Orders

## 2012-07-03 NOTE — Progress Notes (Signed)
Subjective:     Patient ID: Rodney [name], male   DOB: 25-Jan-1941, 71 y.o.   MRN: [id number]  HPI this 71 year old male returns for continued followup regarding his aorto by common iliac stent graft performed in 2010 by me. He has done well with no abdominal or back symptoms. He continues to be quite active. He has now retired. He continues to smoke about one pack of cigarettes per day.  Past Medical History  Diagnosis Date  . Hypertension   . Hyperlipidemia   . Gout   . AAA (abdominal aortic aneurysm)     History  Substance Use Topics  . Smoking status: Current Everyday Smoker -- 1.0 packs/day for 50 years    Types: Cigarettes  . Smokeless tobacco: Never Used  . Alcohol Use: 3.6 oz/week    6 Cans of beer per week    Family History  Problem Relation Age of Onset  . Diabetes Sister   . Cancer Father     prostate    Allergies  Allergen Reactions  . Iohexol      Code: RASH, Desc: PAITENT STATED THAT HE BEGAN ITCHING TOWARDS END OF INJECTIN OF IV CONTRAST-- 13 HR PREP RECOMMENDED/MMS     Current outpatient prescriptions:aspirin 81 MG tablet, Take 81 mg by mouth daily., Disp: , Rfl: ;  chlorthalidone (HYGROTON) 25 MG tablet, Take 25 mg by mouth daily. Take 1/2 tablet daily, Disp: , Rfl: ;  lisinopril (PRINIVIL,ZESTRIL) 10 MG tablet, Take 10 mg by mouth daily., Disp: , Rfl: ;  Multiple Vitamin (MULTIVITAMIN) capsule, Take 1 capsule by mouth daily., Disp: , Rfl:  rosuvastatin (CRESTOR) 10 MG tablet, Take 40 mg by mouth daily. , Disp: , Rfl: ;  fish oil-omega-3 fatty acids 1000 MG capsule, Take 2 g by mouth daily., Disp: , Rfl: ;  NIFEdipine (PROCARDIA XL/ADALAT-CC) 30 MG 24 hr tablet, Take 30 mg by mouth daily., Disp: , Rfl:   BP 150/88  Pulse 64  Temp 98.1 F (36.7 C) (Oral)  Ht 6\' 2"  (1.88 m)  Wt 206 lb (93.441 kg)  BMI 26.45 kg/m2  SpO2 100%  Body mass index is 26.45 kg/(m^2).          Review of Systems denies chest pain, dyspnea on exertion, PND, orthopnea, does  complain of mild claudication in legs after about one to 2 blocks.     Objective:   Physical Exam blood pressure 150/88 heart rate 64 respirations 16 Gen.-alert and oriented x3 in no apparent distress HEENT normal for age Lungs no rhonchi or wheezing Cardiovascular regular rhythm no murmurs carotid pulses 3+ palpable no bruits audible Abdomen soft nontender no palpable masses Musculoskeletal free of  major deformities Skin clear -no rashes Neurologic normal Lower extremities 3+ femoral and dorsalis pedis pulses palpable bilaterally with no edema  Today I ordered a duplex scan of his abdominal aortic stent graft which I reviewed and interpreted. The aneurysm sac continues to shrink now being 3.9 x 3.4 cm in diameter. There is no evidence of endoleak.    Assessment:     Doing well 3 years post aortic stent graft for abdominal aortic aneurysm with decrease in sac size return in one year for duplex scan. Patient has questionable contrast allergy so we'll continue to follow with duplex scanning in office.    Plan:

## 2012-07-09 NOTE — Procedures (Unsigned)
VASCULAR LAB EXAM  INDICATION:  Followup AAA endograft placed 02/19/2009  HISTORY: Diabetes:  No Cardiac:  No Hypertension:  Yes Smoking:  Currently  EXAM: AAA sac size:                 3.46 CM AP        3.94 CM PRV. Previous sac size 03/15/2011:       3.97 CM AP        4.38 CM PRV.  IMPRESSION: 1. The aorta and endograft appear patent. 2. No significant change in size of the aneurysmal sac surrounding the     endograft. 3. Slight dilatation of distal right limb of EVAR (endovascular     aneurysm repair) however stent appears attached to wall and no leak     is detected, measures 2.40 cm x 2.30 cm.     ___________________________________________ [name]. Hart Rochester, M.D.  SH/MEDQ  D:  07/03/2012  T:  07/03/2012  Job:  [id number]

## 2013-01-14 ENCOUNTER — Ambulatory Visit
Admission: RE | Admit: 2013-01-14 | Discharge: 2013-01-14 | Disposition: A | Discharge status: Home / Self Care | Payer: Medicare Other | Source: Ambulatory Visit | Attending: Internal Medicine | Admitting: Internal Medicine

## 2013-01-14 ENCOUNTER — Other Ambulatory Visit: Payer: Self-pay | Admitting: Internal Medicine

## 2013-01-14 DIAGNOSIS — R05 Cough: Secondary | ICD-10-CM

## 2013-07-01 ENCOUNTER — Encounter: Payer: Self-pay | Admitting: Vascular Surgery

## 2013-07-02 ENCOUNTER — Encounter (INDEPENDENT_AMBULATORY_CARE_PROVIDER_SITE_OTHER): Payer: Medicare Other | Admitting: Vascular Surgery

## 2013-07-02 ENCOUNTER — Ambulatory Visit (INDEPENDENT_AMBULATORY_CARE_PROVIDER_SITE_OTHER): Payer: Medicare Other | Admitting: Vascular Surgery

## 2013-07-02 ENCOUNTER — Encounter: Payer: Self-pay | Admitting: Vascular Surgery

## 2013-07-02 VITALS — BP 145/65 | HR 58 | Ht 74.0 in | Wt 204.2 lb

## 2013-07-02 DIAGNOSIS — Z48812 Encounter for surgical aftercare following surgery on the circulatory system: Secondary | ICD-10-CM

## 2013-07-02 DIAGNOSIS — I714 Abdominal aortic aneurysm, without rupture: Secondary | ICD-10-CM

## 2013-07-02 NOTE — Addendum Note (Signed)
Addended by: Adria Dill L on: 07/02/2013 11:12 AM   Modules accepted: Orders

## 2013-07-02 NOTE — Progress Notes (Signed)
Subjective:     Patient ID: Rodney [name], male   DOB: 18-Aug-1941, 72 y.o.   MRN: [id number]  HPI this 72 year old male returns for continued followup regarding his aortic stent graft in abdominal aortic aneurysm which is inserted and 2010. He denies any new abdominal or back symptoms. He has had no health issues since his last visit one year ago.  Past Medical History  Diagnosis Date  . Hypertension   . Hyperlipidemia   . Gout   . AAA (abdominal aortic aneurysm)   . CAD (coronary artery disease)     History  Substance Use Topics  . Smoking status: Current Every Day Smoker -- 1.00 packs/day for 50 years    Types: Cigarettes  . Smokeless tobacco: Never Used  . Alcohol Use: 4.2 - 4.8 oz/week    6 Cans of beer, 1-2 Shots of liquor per week    Family History  Problem Relation Age of Onset  . Diabetes Sister   . Hypertension Sister   . Cancer Father     prostate  . Hypertension Mother     Allergies  Allergen Reactions  . Iohexol      Code: RASH, Desc: PAITENT STATED THAT HE BEGAN ITCHING TOWARDS END OF INJECTIN OF IV CONTRAST-- 13 HR PREP RECOMMENDED/MMS     Current outpatient prescriptions:aspirin 81 MG tablet, Take 81 mg by mouth daily., Disp: , Rfl: ;  chlorthalidone (HYGROTON) 25 MG tablet, Take 25 mg by mouth daily. Take 1/2 tablet daily, Disp: , Rfl: ;  fish oil-omega-3 fatty acids 1000 MG capsule, Take 2 g by mouth daily., Disp: , Rfl: ;  lisinopril (PRINIVIL,ZESTRIL) 10 MG tablet, Take 10 mg by mouth daily., Disp: , Rfl:  Multiple Vitamin (MULTIVITAMIN) capsule, Take 1 capsule by mouth daily., Disp: , Rfl: ;  rosuvastatin (CRESTOR) 10 MG tablet, Take 40 mg by mouth daily. , Disp: , Rfl: ;  NIFEdipine (PROCARDIA XL/ADALAT-CC) 30 MG 24 hr tablet, Take 30 mg by mouth daily., Disp: , Rfl:   BP 145/65  Pulse 58  Ht 6\' 2"  (1.88 m)  Wt 204 lb 3.2 oz (92.625 kg)  BMI 26.21 kg/m2  SpO2 100%  Body mass index is 26.21 kg/(m^2).           Review of Systems denies  chest pain, dyspnea on exertion, PND, orthopnea, hemoptysis. Does have some discomfort in legs with ambulation. Other systems negative and complete review of systems    Objective:   Physical Exam blood pressure 145/65 heart rate 58 respirations 18 Gen.-alert and oriented x3 in no apparent distress HEENT normal for age Lungs no rhonchi or wheezing Cardiovascular regular rhythm no murmurs carotid pulses 3+ palpable no bruits audible Abdomen soft nontender no palpable masses Musculoskeletal free of  major deformities Skin clear -no rashes Neurologic normal Lower extremities 3+ femoral and dorsalis pedis pulses palpable bilaterally with no edema  Data ordered a duplex scan of his abdominal aorta which are reviewed and interpreted. Next aneurysm sac continues to contract around the stent graft with no evidence of endoleak. There is some dilatation of the proximal aorta and the 3.5-4 cm range.      Assessment:     4 years post aortic endograft for aortic aneurysm with some dilatation of proximal aorta by duplex scan    Plan:     Return in 12 months with CT angiogram of abdomen and pelvis for further followup regarding his aortic endograft

## 2013-10-02 ENCOUNTER — Other Ambulatory Visit: Payer: Self-pay | Admitting: *Deleted

## 2013-11-14 ENCOUNTER — Encounter (HOSPITAL_BASED_OUTPATIENT_CLINIC_OR_DEPARTMENT_OTHER): Payer: Self-pay | Admitting: *Deleted

## 2013-11-14 NOTE — Progress Notes (Signed)
Pt saw dr Melburn Popper for stress and clearance for an AAA repair-had a cath with minimal cad-cleared-2010 Has not returned-sees dr Hart Rochester regularly post AAA graft-stent-no cp or sob To come in for bmet-called dr rogerts for ekg Will review with dr crews

## 2013-11-14 NOTE — Progress Notes (Signed)
Did review last cxr,dr lawson note and cath report 2010 with dr crews-he was ok-did not need cardiac fu .

## 2013-11-15 ENCOUNTER — Encounter (HOSPITAL_BASED_OUTPATIENT_CLINIC_OR_DEPARTMENT_OTHER)
Admission: RE | Admit: 2013-11-15 | Discharge: 2013-11-15 | Disposition: A | Discharge status: Home / Self Care | Payer: Medicare Other | Source: Ambulatory Visit | Attending: Orthopedic Surgery | Admitting: Orthopedic Surgery

## 2013-11-15 DIAGNOSIS — Z01818 Encounter for other preprocedural examination: Secondary | ICD-10-CM | POA: Insufficient documentation

## 2013-11-15 DIAGNOSIS — Z01812 Encounter for preprocedural laboratory examination: Secondary | ICD-10-CM | POA: Insufficient documentation

## 2013-11-15 LAB — BASIC METABOLIC PANEL
BUN: 16 mg/dL (ref 6–23)
Chloride: 105 mEq/L (ref 96–112)
GFR calc Af Amer: 59 mL/min — ABNORMAL LOW (ref >90)
Glucose, Bld: 106 mg/dL — ABNORMAL HIGH (ref 70–99)
Potassium: 4.5 mEq/L (ref 3.5–5.1)
Sodium: 140 mEq/L (ref 135–145)

## 2013-11-18 NOTE — H&P (Signed)
  [facility] [address]   [address] [place], [place] [address] [phone] A Division of [facility]  Loreta Ave, M.D.   Robert A. Thurston Hole, M.D.   Burnell Blanks, M.D.   Eulas Post, M.D.   Lunette Stands, M.D Jewel Baize. Eulah Pont, M.D.  Buford Dresser, M.D.  Estell Harpin, M.D.    Melina Fiddler, M.D. Mary L. Isidoro Donning, PA-C  Kirstin A. Shepperson, PA-C  Josh [place]ville, PA-C [place], [place]   RE: [place], [place]   [id number]      DOB: 03/19/1963 PROGRESS NOTE: 11-04-13 Reason for visit:  Follow-up MRI of the left shoulder, referral from Dr. Charlett Blake. History of present illness: He continues to have left shoulder pain  with activities of daily living and pain with any overhead activity. He has tried NSAID's and exercise without relief.   Please see associated documentation for this clinic visit for further past medical, family, surgical and social history, review of systems, and exam findings as this was reviewed by me.  EXAMINATION: Well appearing male in no apparent distress. The left upper extremity is neurovascularly intact. he has positive Hawkins 4/5 supraspinatus strength. He has biceps tendonitis.  IMAGING: Review of his MRI demonstrates supraspinatus tear with mild retraction of SLAP tear and subacromial impingement.   ASSESSMENT: Left shoulder rotator cuff tear subacromial impingement SLAP tear and distal clavicle arthrosis.  PLAN: 1. We will schedule arthroscopic subacromial decompression distal clavicle excision biceps tenotomy and rotator cuff repair. Marcial Pacas D.  Eulah Pont, M.D.  Electronically verified by Jewel Baize. Eulah Pont, M.D. TDM:kah D 11-11-13 T 11-11-13

## 2013-11-19 ENCOUNTER — Ambulatory Visit (HOSPITAL_BASED_OUTPATIENT_CLINIC_OR_DEPARTMENT_OTHER)
Admission: RE | Admit: 2013-11-19 | Discharge: 2013-11-19 | Disposition: A | Discharge status: Home / Self Care | Payer: Medicare Other | Source: Ambulatory Visit | Attending: Orthopedic Surgery | Admitting: Orthopedic Surgery

## 2013-11-19 ENCOUNTER — Encounter (HOSPITAL_BASED_OUTPATIENT_CLINIC_OR_DEPARTMENT_OTHER): Payer: Self-pay | Admitting: *Deleted

## 2013-11-19 ENCOUNTER — Ambulatory Visit (HOSPITAL_BASED_OUTPATIENT_CLINIC_OR_DEPARTMENT_OTHER): Payer: Medicare Other | Admitting: Anesthesiology

## 2013-11-19 ENCOUNTER — Encounter (HOSPITAL_BASED_OUTPATIENT_CLINIC_OR_DEPARTMENT_OTHER): Payer: Medicare Other | Admitting: Anesthesiology

## 2013-11-19 ENCOUNTER — Encounter (HOSPITAL_BASED_OUTPATIENT_CLINIC_OR_DEPARTMENT_OTHER)
Admission: RE | Disposition: A | Discharge status: Home / Self Care | Payer: Self-pay | Source: Ambulatory Visit | Attending: Orthopedic Surgery

## 2013-11-19 DIAGNOSIS — Z01810 Encounter for preprocedural cardiovascular examination: Secondary | ICD-10-CM | POA: Insufficient documentation

## 2013-11-19 DIAGNOSIS — S43439A Superior glenoid labrum lesion of unspecified shoulder, initial encounter: Secondary | ICD-10-CM | POA: Insufficient documentation

## 2013-11-19 DIAGNOSIS — I2510 Atherosclerotic heart disease of native coronary artery without angina pectoris: Secondary | ICD-10-CM | POA: Insufficient documentation

## 2013-11-19 DIAGNOSIS — M1990 Unspecified osteoarthritis, unspecified site: Secondary | ICD-10-CM | POA: Insufficient documentation

## 2013-11-19 DIAGNOSIS — M19019 Primary osteoarthritis, unspecified shoulder: Secondary | ICD-10-CM | POA: Insufficient documentation

## 2013-11-19 DIAGNOSIS — S43429A Sprain of unspecified rotator cuff capsule, initial encounter: Secondary | ICD-10-CM | POA: Insufficient documentation

## 2013-11-19 DIAGNOSIS — F17200 Nicotine dependence, unspecified, uncomplicated: Secondary | ICD-10-CM | POA: Insufficient documentation

## 2013-11-19 DIAGNOSIS — I10 Essential (primary) hypertension: Secondary | ICD-10-CM | POA: Insufficient documentation

## 2013-11-19 DIAGNOSIS — Z01812 Encounter for preprocedural laboratory examination: Secondary | ICD-10-CM | POA: Insufficient documentation

## 2013-11-19 DIAGNOSIS — X58XXXA Exposure to other specified factors, initial encounter: Secondary | ICD-10-CM | POA: Insufficient documentation

## 2013-11-19 DIAGNOSIS — I739 Peripheral vascular disease, unspecified: Secondary | ICD-10-CM | POA: Insufficient documentation

## 2013-11-19 HISTORY — PX: SHOULDER ARTHROSCOPY WITH ROTATOR CUFF REPAIR AND SUBACROMIAL DECOMPRESSION: SHX5686

## 2013-11-19 HISTORY — DX: Unspecified osteoarthritis, unspecified site: M19.90

## 2013-11-19 LAB — POCT HEMOGLOBIN-HEMACUE: Hemoglobin: 15.6 g/dL (ref 13.0–17.0)

## 2013-11-19 SURGERY — SHOULDER ARTHROSCOPY WITH ROTATOR CUFF REPAIR AND SUBACROMIAL DECOMPRESSION
Anesthesia: Regional | Site: Shoulder | Laterality: Left

## 2013-11-19 MED ORDER — FENTANYL CITRATE 0.05 MG/ML IJ SOLN
INTRAMUSCULAR | Status: AC
  Filled 2013-11-19: qty 2

## 2013-11-19 MED ORDER — FENTANYL CITRATE 0.05 MG/ML IJ SOLN: 50.0000 ug | INTRAMUSCULAR | Status: DC | PRN

## 2013-11-19 MED ORDER — CEFAZOLIN SODIUM 1-5 GM-% IV SOLN
INTRAVENOUS | Status: AC
  Filled 2013-11-19: qty 50

## 2013-11-19 MED ORDER — MIDAZOLAM HCL 2 MG/2ML IJ SOLN
1.0000 mg | INTRAMUSCULAR | Status: DC | PRN
  Administered 2013-11-19: 2 mg via INTRAVENOUS

## 2013-11-19 MED ORDER — OXYCODONE HCL 5 MG PO TABS: 5.0000 mg | ORAL_TABLET | Freq: Once | ORAL | Status: DC | PRN

## 2013-11-19 MED ORDER — DEXAMETHASONE SODIUM PHOSPHATE 4 MG/ML IJ SOLN
INTRAMUSCULAR | Status: DC | PRN
  Administered 2013-11-19: 8 mg via INTRAVENOUS

## 2013-11-19 MED ORDER — OXYCODONE-ACETAMINOPHEN 5-325 MG PO TABS: 2.0000 | ORAL_TABLET | ORAL | Status: DC | PRN

## 2013-11-19 MED ORDER — BUPIVACAINE-EPINEPHRINE PF 0.5-1:200000 % IJ SOLN
INTRAMUSCULAR | Status: DC | PRN
  Administered 2013-11-19: 30 mL

## 2013-11-19 MED ORDER — PROPOFOL 10 MG/ML IV BOLUS
INTRAVENOUS | Status: DC | PRN
  Administered 2013-11-19: 200 mg via INTRAVENOUS

## 2013-11-19 MED ORDER — DOCUSATE SODIUM 100 MG PO CAPS: 100.0000 mg | ORAL_CAPSULE | Freq: Two times a day (BID) | ORAL | Status: DC

## 2013-11-19 MED ORDER — CEFAZOLIN SODIUM-DEXTROSE 2-3 GM-% IV SOLR
INTRAVENOUS | Status: AC
  Filled 2013-11-19: qty 50

## 2013-11-19 MED ORDER — SUCCINYLCHOLINE CHLORIDE 20 MG/ML IJ SOLN
INTRAMUSCULAR | Status: AC
  Filled 2013-11-19: qty 1

## 2013-11-19 MED ORDER — DEXTROSE 5 % IV SOLN
3.0000 g | INTRAVENOUS | Status: AC
  Administered 2013-11-19: 3 g via INTRAVENOUS

## 2013-11-19 MED ORDER — MIDAZOLAM HCL 2 MG/2ML IJ SOLN
INTRAMUSCULAR | Status: AC
  Filled 2013-11-19: qty 4

## 2013-11-19 MED ORDER — ONDANSETRON HCL 4 MG/2ML IJ SOLN
INTRAMUSCULAR | Status: DC | PRN
  Administered 2013-11-19: 4 mg via INTRAVENOUS

## 2013-11-19 MED ORDER — BUPIVACAINE HCL (PF) 0.25 % IJ SOLN
INTRAMUSCULAR | Status: AC
  Filled 2013-11-19: qty 30

## 2013-11-19 MED ORDER — DEXTROSE-NACL 5-0.45 % IV SOLN: 100.0000 mL/h | INTRAVENOUS | Status: DC

## 2013-11-19 MED ORDER — MIDAZOLAM HCL 2 MG/2ML IJ SOLN: 1.0000 mg | INTRAMUSCULAR | Status: DC | PRN

## 2013-11-19 MED ORDER — LACTATED RINGERS IV SOLN
INTRAVENOUS | Status: DC
  Administered 2013-11-19 (×2): via INTRAVENOUS

## 2013-11-19 MED ORDER — FENTANYL CITRATE 0.05 MG/ML IJ SOLN: 25.0000 ug | INTRAMUSCULAR | Status: DC | PRN

## 2013-11-19 MED ORDER — OXYCODONE HCL 5 MG/5ML PO SOLN: 5.0000 mg | Freq: Once | ORAL | Status: DC | PRN

## 2013-11-19 MED ORDER — ONDANSETRON HCL 4 MG/2ML IJ SOLN: 4.0000 mg | Freq: Four times a day (QID) | INTRAMUSCULAR | Status: DC | PRN

## 2013-11-19 MED ORDER — SUCCINYLCHOLINE CHLORIDE 20 MG/ML IJ SOLN
INTRAMUSCULAR | Status: DC | PRN
  Administered 2013-11-19: 100 mg via INTRAVENOUS

## 2013-11-19 MED ORDER — MIDAZOLAM HCL 2 MG/2ML IJ SOLN
INTRAMUSCULAR | Status: AC
  Filled 2013-11-19: qty 2

## 2013-11-19 MED ORDER — CHLORHEXIDINE GLUCONATE 4 % EX LIQD: 60.0000 mL | Freq: Once | CUTANEOUS | Status: DC

## 2013-11-19 MED ORDER — ONDANSETRON HCL 4 MG PO TABS: 4.0000 mg | ORAL_TABLET | Freq: Three times a day (TID) | ORAL | Status: DC | PRN

## 2013-11-19 MED ORDER — EPHEDRINE SULFATE 50 MG/ML IJ SOLN
INTRAMUSCULAR | Status: DC | PRN
  Administered 2013-11-19: 15 mg via INTRAVENOUS
  Administered 2013-11-19 (×2): 10 mg via INTRAVENOUS

## 2013-11-19 MED ORDER — SODIUM CHLORIDE 0.9 % IR SOLN
Status: DC | PRN
  Administered 2013-11-19: 21000 mL

## 2013-11-19 MED ORDER — FENTANYL CITRATE 0.05 MG/ML IJ SOLN
50.0000 ug | INTRAMUSCULAR | Status: DC | PRN
  Administered 2013-11-19: 100 ug via INTRAVENOUS

## 2013-11-19 MED ORDER — ACETAMINOPHEN 500 MG PO TABS: 1000.0000 mg | ORAL_TABLET | Freq: Once | ORAL | Status: DC

## 2013-11-19 SURGICAL SUPPLY — 75 items
ANCHOR PEEK SWIVEL LOCK 5.5 (Anchor) ×2 IMPLANT
ANCHOR SUT BIO SW 4.75X19.1 (Anchor) ×4 IMPLANT
BENZOIN TINCTURE PRP APPL 2/3 (GAUZE/BANDAGES/DRESSINGS) ×2 IMPLANT
BLADE AVERAGE 25X9 (BLADE) IMPLANT
BLADE CUTTER GATOR 3.5 (BLADE) ×2 IMPLANT
BLADE CUTTER MENIS 5.5 (BLADE) IMPLANT
BLADE GREAT WHITE 4.2 (BLADE) IMPLANT
BLADE SURG 15 STRL LF DISP TIS (BLADE) IMPLANT
BLADE SURG 15 STRL SS (BLADE)
BUR OVAL 4.0 (BURR) ×2 IMPLANT
BUR OVAL 6.0 (BURR) ×2 IMPLANT
CANISTER SUCT 3000ML (MISCELLANEOUS) IMPLANT
CANISTER SUCT LVC 12 LTR MEDI- (MISCELLANEOUS) ×4 IMPLANT
CANNULA 5.75X71 LONG (CANNULA) ×2 IMPLANT
CANNULA DRY DOC 8X75 (CANNULA) ×2 IMPLANT
CANNULA TWIST IN 8.25X7CM (CANNULA) ×2 IMPLANT
DECANTER SPIKE VIAL GLASS SM (MISCELLANEOUS) IMPLANT
DRAPE INCISE IOBAN 66X45 STRL (DRAPES) ×2 IMPLANT
DRAPE STERI 35X30 U-POUCH (DRAPES) ×2 IMPLANT
DRAPE U 20/CS (DRAPES) ×2 IMPLANT
DRAPE U-SHAPE 47X51 STRL (DRAPES) ×2 IMPLANT
DRAPE U-SHAPE 76X120 STRL (DRAPES) ×4 IMPLANT
DURAPREP 26ML APPLICATOR (WOUND CARE) ×2 IMPLANT
ELECT MENISCUS 165MM 90D (ELECTRODE) ×2 IMPLANT
ELECT NEEDLE TIP 2.8 STRL (NEEDLE) IMPLANT
ELECT REM PT RETURN 9FT ADLT (ELECTROSURGICAL) ×2
ELECTRODE REM PT RTRN 9FT ADLT (ELECTROSURGICAL) ×1 IMPLANT
GAUZE XEROFORM 1X8 LF (GAUZE/BANDAGES/DRESSINGS) ×2 IMPLANT
GLOVE BIO SURGEON STRL SZ7.5 (GLOVE) ×2 IMPLANT
GLOVE BIO SURGEON STRL SZ8 (GLOVE) IMPLANT
GLOVE BIOGEL PI IND STRL 7.0 (GLOVE) ×1 IMPLANT
GLOVE BIOGEL PI IND STRL 8 (GLOVE) ×2 IMPLANT
GLOVE BIOGEL PI IND STRL 8.5 (GLOVE) IMPLANT
GLOVE BIOGEL PI INDICATOR 7.0 (GLOVE) ×1
GLOVE BIOGEL PI INDICATOR 8 (GLOVE) ×2
GLOVE BIOGEL PI INDICATOR 8.5 (GLOVE)
GLOVE ECLIPSE 6.5 STRL STRAW (GLOVE) ×2 IMPLANT
GOWN PREVENTION PLUS XLARGE (GOWN DISPOSABLE) ×6 IMPLANT
NDL SUT 6 .5 CRC .975X.05 MAYO (NEEDLE) IMPLANT
NEEDLE MAYO TAPER (NEEDLE)
NEEDLE SCORPION MULTI FIRE (NEEDLE) ×2 IMPLANT
NS IRRIG 1000ML POUR BTL (IV SOLUTION) IMPLANT
PACK ARTHROSCOPY DSU (CUSTOM PROCEDURE TRAY) ×2 IMPLANT
PACK BASIN DAY SURGERY FS (CUSTOM PROCEDURE TRAY) ×2 IMPLANT
PAD ABD 8X10 STRL (GAUZE/BANDAGES/DRESSINGS) ×2 IMPLANT
PENCIL BUTTON HOLSTER BLD 10FT (ELECTRODE) ×2 IMPLANT
SET ARTHROSCOPY TUBING (MISCELLANEOUS) ×1
SET ARTHROSCOPY TUBING LN (MISCELLANEOUS) ×1 IMPLANT
SLEEVE SCD COMPRESS KNEE MED (MISCELLANEOUS) ×2 IMPLANT
SLING ARM FOAM STRAP LRG (SOFTGOODS) IMPLANT
SLING ARM FOAM STRAP MED (SOFTGOODS) IMPLANT
SLING ARM FOAM STRAP XLG (SOFTGOODS) IMPLANT
SLING ARM IMMOBILIZER LRG (SOFTGOODS) ×2 IMPLANT
SLING ARM IMMOBILIZER MED (SOFTGOODS) IMPLANT
SPONGE GAUZE 4X4 12PLY (GAUZE/BANDAGES/DRESSINGS) ×2 IMPLANT
SPONGE LAP 4X18 X RAY DECT (DISPOSABLE) IMPLANT
STRIP CLOSURE SKIN 1/2X4 (GAUZE/BANDAGES/DRESSINGS) ×2 IMPLANT
SUCTION FRAZIER TIP 10 FR DISP (SUCTIONS) IMPLANT
SUT ETHIBOND 2 OS 4 DA (SUTURE) IMPLANT
SUT ETHILON 2 0 FS 18 (SUTURE) IMPLANT
SUT ETHILON 3 0 PS 1 (SUTURE) IMPLANT
SUT FIBERWIRE #2 38 T-5 BLUE (SUTURE)
SUT MNCRL AB 4-0 PS2 18 (SUTURE) ×2 IMPLANT
SUT TIGER TAPE 7 IN WHITE (SUTURE) ×2 IMPLANT
SUT VIC AB 0 CT1 27 (SUTURE)
SUT VIC AB 0 CT1 27XBRD ANBCTR (SUTURE) IMPLANT
SUT VIC AB 2-0 SH 27 (SUTURE)
SUT VIC AB 2-0 SH 27XBRD (SUTURE) IMPLANT
SUT VIC AB 3-0 FS2 27 (SUTURE) IMPLANT
SUTURE FIBERWR #2 38 T-5 BLUE (SUTURE) IMPLANT
TAPE FIBER 2MM 7IN #2 BLUE (SUTURE) ×2 IMPLANT
TOWEL OR 17X24 6PK STRL BLUE (TOWEL DISPOSABLE) ×2 IMPLANT
WAND STAR VAC 90 (SURGICAL WAND) ×4 IMPLANT
WATER STERILE IRR 1000ML POUR (IV SOLUTION) ×2 IMPLANT
YANKAUER SUCT BULB TIP NO VENT (SUCTIONS) IMPLANT

## 2013-11-19 NOTE — Op Note (Signed)
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    The risks benefits and alternatives were discussed with the patient preoperatively including but not limited to the risks of infection, bleeding, nerve injury, cardiopulmonary complications, the need for revision surgery, among others, and the patient was willing to proceed.  OPERATIVE IMPLANTS: pushlocks x 2  OPERATIVE FINDINGS: SS tear, SLAP tea, SAI, DC OA  BLOOD LOSS: minimal  COMPLICATIONS: none  OPERATIVE PROCEDURE:  Patient was identified in the preoperative holding area and site was marked by me He was transported to the operating theater and placed on the table in beach chair position taking care to pad all bony prominences. After a preincinduction time out anesthesia was induced. The Left upper extremity was prepped and draped in normal sterile fashion and a pre-incision timeout was performed. Rodney Artis Fincher received Ancef for preoperative antibiotics.   Initially made a posterior arthroscopic portal and inserted the arthroscope into the glenohumeral joint. tour of the joint demonstrated the above operative findings  I created an anterior portal just lateral to the coracoid under direct visualization  using a spinal needle.   I used a combination of biter and shaver to release the biceps tendon from the superior labrum and then used the shaver to debride the superior labrum to a smooth rim.  I then introduced the arthroscope into the subacromial space and brought the shaver into the anterior portal. I debrided the bursa for appropriate visualization.   I debrided the rotator cuff tear and examined its mobility. There was a roughly 7 millimeters tear and I debrided the tear here. I then debrided the footprint to good bone for placement of the tendon. Next I marked the spot for the push locks. I used the scorpion to pass two fiber tapes in a horizontal fasion. I took the anterior fibertapes and placed them into the pushlock posteriorly, I had to convert to a 5.5 pushlock and had a good bite on this as the first push lock pulled out. Next a placed the anterior push lock and was very happy with the opposition of the cuff on the footprint.   I then performed a subacromial decompression using combination of the shaver ArthroCare and burr using a cutting block technique. As happy with the final elevation of the subacromial space on multiple portal views.  Next I turned my attention to the distal clavicle and through the anterior portal using the bur and shaver I was able to perform a distal clavicle excision. I then switched portals and inserted the arthroscope into the anterior portal and was happy with an appropriate resection of the distal clavicle.  Next I removed all arthroscopic equipment expressed all fluid and closed the portals with a nylon stitch. A sterile dressing was applied the patient was taken the PACU in stable condition.  POST OPERATIVE PLAN: The  patient will be in a sling full-time and keep the dressings clean dry and intact. DVT prophylaxis will consist of early ambulation

## 2013-11-19 NOTE — Transfer of Care (Signed)
Immediate Anesthesia Transfer of Care Note  Patient: Rodney [name]  Procedure(s) Performed: Procedure(s): LEFT SHOULDER ARTHROSCOPY DEBRIDEMENT EXTENTSIVE DISTAL CLAVICULECTOMY DECOMPRESSION PARTIAL ACROMIOPLASTY WITH CORACOACROMIAL WITH ROTATOR CUFF REPAIR  (Left)  Patient Location: PACU  Anesthesia Type:GA combined with regional for post-op pain  Level of Consciousness: sedated  Airway & Oxygen Therapy: Patient Spontanous Breathing and Patient connected to face mask oxygen  Post-op Assessment: Report given to PACU RN and Post -op Vital signs reviewed and stable  Post vital signs: Reviewed and stable  Complications: No apparent anesthesia complications

## 2013-11-19 NOTE — Anesthesia Postprocedure Evaluation (Signed)
  Anesthesia Post-op Note  Patient: Rodney [name]  Procedure(s) Performed: Procedure(s): LEFT SHOULDER ARTHROSCOPY DEBRIDEMENT EXTENTSIVE DISTAL CLAVICULECTOMY DECOMPRESSION PARTIAL ACROMIOPLASTY WITH CORACOACROMIAL WITH ROTATOR CUFF REPAIR  (Left)  Patient Location: PACU  Anesthesia Type:GA combined with regional for post-op pain  Level of Consciousness: awake  Airway and Oxygen Therapy: Patient Spontanous Breathing  Post-op Pain: none  Post-op Assessment: Post-op Vital signs reviewed, Patient's Cardiovascular Status Stable, Respiratory Function Stable, Patent Airway, No signs of Nausea or vomiting and Pain level controlled  Post-op Vital Signs: Reviewed and stable  Complications: No apparent anesthesia complications

## 2013-11-19 NOTE — Progress Notes (Signed)
Assisted Dr. Hodierne with left, ultrasound guided, interscalene  block. Side rails up, monitors on throughout procedure. See vital signs in flow sheet. Tolerated Procedure well. 

## 2013-11-19 NOTE — Interval H&P Note (Signed)
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 After consideration of risks, benefits and other options for treatment, the patient has consented to  Procedure(s): LEFT SHOULDER ARTHROSCOPY DEBRIDEMENT EXTENTSIVE DISTAL CLAVICULECTOMY DECOMPRESSION PARTIAL ACROMIOPLASTY WITH CORACOACROMIAL WITH ROTATOR CUFF REPAIR  (Left) as a surgical intervention .  The patient's history has been reviewed, patient examined, no change in status, stable for surgery.  I have reviewed the patient's chart and labs.  Questions were answered to the patient's satisfaction.     Cabria Micalizzi, D

## 2013-11-19 NOTE — Anesthesia Preprocedure Evaluation (Signed)
Anesthesia Evaluation  Patient identified by MRN, date of birth, ID band Patient awake    Reviewed: Allergy & Precautions, H&P , NPO status , Patient's Chart, lab work & pertinent test results  Airway Mallampati: II  Neck ROM: full    Dental   Pulmonary Current Smoker,          Cardiovascular hypertension, + CAD and + Peripheral Vascular Disease     Neuro/Psych    GI/Hepatic   Endo/Other    Renal/GU      Musculoskeletal  (+) Arthritis -,   Abdominal   Peds  Hematology   Anesthesia Other Findings   Reproductive/Obstetrics                           Anesthesia Physical Anesthesia Plan  ASA: III  Anesthesia Plan: General and Regional   Post-op Pain Management: MAC Combined w/ Regional for Post-op pain   Induction: Intravenous  Airway Management Planned: Oral ETT  Additional Equipment:   Intra-op Plan:   Post-operative Plan: Extubation in OR  Informed Consent: I have reviewed the patients History and Physical, chart, labs and discussed the procedure including the risks, benefits and alternatives for the proposed anesthesia with the patient or authorized representative who has indicated his/her understanding and acceptance.     Plan Discussed with: CRNA, Anesthesiologist and Surgeon  Anesthesia Plan Comments:         Anesthesia Quick Evaluation

## 2013-11-19 NOTE — Anesthesia Procedure Notes (Addendum)
Anesthesia Regional Block:  Interscalene brachial plexus block  Pre-Anesthetic Checklist: ,, timeout performed, Correct Patient, Correct Site, Correct Laterality, Correct Procedure, Correct Position, site marked, Risks and benefits discussed,  Surgical consent,  Pre-op evaluation,  At surgeon's request and post-op pain management  Laterality: Left  Prep: chloraprep       Needles:  Injection technique: Single-shot  Needle Type: Echogenic Stimulator Needle     Needle Length: 5cm 5 cm Needle Gauge: 22 and 22 G    Additional Needles:  Procedures: ultrasound guided (picture in chart) and nerve stimulator Interscalene brachial plexus block  Nerve Stimulator or Paresthesia:  Response: biceps flexion, 0.45 mA,   Additional Responses:   Narrative:  Start time: 11/19/2013 9:51 AM End time: 11/19/2013 10:00 AM Injection made incrementally with aspirations every 5 mL.  Performed by: Personally  Anesthesiologist: Dr Chaney Malling  Additional Notes: Functioning IV was confirmed and monitors were applied.  A 50mm 22ga Arrow echogenic stimulator needle was used. Sterile prep and drape,hand hygiene and sterile gloves were used.  Negative aspiration and negative test dose prior to incremental administration of local anesthetic. The patient tolerated the procedure well.  Ultrasound guidance: relevent anatomy identified, needle position confirmed, local anesthetic spread visualized around nerve(s), vascular puncture avoided.  Image printed for medical record.    Procedure Name: Intubation Performed by: Lance Coon Pre-anesthesia Checklist: Patient identified, Emergency Drugs available, Suction available and Patient being monitored Patient Re-evaluated:Patient Re-evaluated prior to inductionOxygen Delivery Method: Circle System Utilized Preoxygenation: Pre-oxygenation with 100% oxygen Intubation Type: IV induction Ventilation: Mask ventilation without difficulty Laryngoscope Size: Mac and  3 Grade View: Grade I Tube type: Oral Tube size: 7.5 mm Number of attempts: 1 Airway Equipment and Method: stylet and oral airway Placement Confirmation: ETT inserted through vocal cords under direct vision,  positive ETCO2 and breath sounds checked- equal and bilateral Tube secured with: Tape Dental Injury: Teeth and Oropharynx as per pre-operative assessment

## 2013-11-20 ENCOUNTER — Encounter (HOSPITAL_BASED_OUTPATIENT_CLINIC_OR_DEPARTMENT_OTHER): Payer: Self-pay | Admitting: Orthopedic Surgery

## 2014-06-13 ENCOUNTER — Emergency Department (INDEPENDENT_AMBULATORY_CARE_PROVIDER_SITE_OTHER): Payer: Medicare Other

## 2014-06-13 ENCOUNTER — Emergency Department (HOSPITAL_COMMUNITY)
Admission: EM | Admit: 2014-06-13 | Discharge: 2014-06-13 | Disposition: A | Discharge status: Home / Self Care | Payer: Medicare Other | Source: Home / Self Care | Attending: Family Medicine | Admitting: Family Medicine

## 2014-06-13 ENCOUNTER — Encounter (HOSPITAL_COMMUNITY): Payer: Self-pay | Admitting: Emergency Medicine

## 2014-06-13 DIAGNOSIS — M19071 Primary osteoarthritis, right ankle and foot: Secondary | ICD-10-CM

## 2014-06-13 DIAGNOSIS — M19079 Primary osteoarthritis, unspecified ankle and foot: Secondary | ICD-10-CM

## 2014-06-13 MED ORDER — MELOXICAM 7.5 MG PO TABS: 7.5000 mg | ORAL_TABLET | Freq: Two times a day (BID) | ORAL | Status: DC

## 2014-06-13 NOTE — ED Provider Notes (Signed)
CSN: [id number]     Arrival date & time 06/13/14  1030 History   First MD Initiated Contact with Patient 06/13/14 1056     Chief Complaint  Patient presents with  . Foot Pain   (Consider location/radiation/quality/duration/timing/severity/associated sxs/prior Treatment) Patient is a 73 y.o. male presenting with lower extremity pain. The history is provided by the patient.  Foot Pain This is a new problem. The current episode started 6 to 12 hours ago (played golf yest, NKI, does have family hx of gout.). The problem has been gradually worsening. Pertinent negatives include no abdominal pain, no headaches and no shortness of breath. Associated symptoms comments: swelling.    Past Medical History  Diagnosis Date  . Hypertension   . Hyperlipidemia   . Gout   . CAD (coronary artery disease)     cath 2010  . AAA (abdominal aortic aneurysm)     stent graft put in 02/2009  . Arthritis    Past Surgical History  Procedure Laterality Date  . Abdominal aortic aneurysm repair  02/19/2009    performed by [facility]  . Colonoscopy    . Knee arthroscopy  2003    left  . Shoulder arthroscopy with rotator cuff repair and subacromial decompression Left 11/19/2013    Procedure: LEFT SHOULDER ARTHROSCOPY DEBRIDEMENT EXTENTSIVE DISTAL CLAVICULECTOMY DECOMPRESSION PARTIAL ACROMIOPLASTY WITH CORACOACROMIAL WITH ROTATOR CUFF REPAIR ;  Surgeon: Renette Butters, MD;  Location: [place];  Service: Orthopedics;  Laterality: Left;   Family History  Problem Relation Age of Onset  . Diabetes Sister   . Hypertension Sister   . Cancer Father     prostate  . Hypertension Mother    History  Substance Use Topics  . Smoking status: Current Every Day Smoker -- 1.00 packs/day for 50 years    Types: Cigarettes  . Smokeless tobacco: Never Used  . Alcohol Use: 4.2 - 4.8 oz/week    6 Cans of beer, 1-2 Shots of liquor per week     Comment: occ    Review of Systems  Constitutional: Negative.     Respiratory: Negative for shortness of breath.   Gastrointestinal: Negative for abdominal pain.  Musculoskeletal: Positive for gait problem and joint swelling. Negative for myalgias.  Skin: Negative.   Neurological: Negative for headaches.    Allergies  Iohexol  Home Medications   Prior to Admission medications   Medication Sig Start Date End Date Taking? Authorizing Provider  aspirin 81 MG tablet Take 81 mg by mouth daily.    Historical Provider, MD  chlorthalidone (HYGROTON) 25 MG tablet Take 25 mg by mouth daily. Take 1/2 tablet daily    Historical Provider, MD  docusate sodium (COLACE) 100 MG capsule Take 1 capsule (100 mg total) by mouth 2 (two) times daily. Continue this while taking narcotics to help with bowel movements 11/19/13   Renette Butters, MD  fish oil-omega-3 fatty acids 1000 MG capsule Take 2 g by mouth daily.    Historical Provider, MD  lisinopril (PRINIVIL,ZESTRIL) 10 MG tablet Take 10 mg by mouth daily.    Historical Provider, MD  meloxicam (MOBIC) 7.5 MG tablet Take 1 tablet (7.5 mg total) by mouth 2 (two) times daily after a meal. 06/13/14   Billy Fischer, MD  Multiple Vitamin (MULTIVITAMIN) capsule Take 1 capsule by mouth daily.    Historical Provider, MD  ondansetron (ZOFRAN) 4 MG tablet Take 1 tablet (4 mg total) by mouth every 8 (eight) hours as needed for nausea.  11/19/13   Renette Butters, MD  oxyCODONE-acetaminophen (ROXICET) 5-325 MG per tablet Take 2 tablets by mouth every 4 (four) hours as needed. 11/19/13   Renette Butters, MD  rosuvastatin (CRESTOR) 10 MG tablet Take 40 mg by mouth daily.     Historical Provider, MD   BP 143/74  Pulse 71  Temp(Src) 97.1 F (36.2 C) (Oral)  Resp 14  SpO2 99% Physical Exam  Nursing note and vitals reviewed. Constitutional: He is oriented to person, place, and time. He appears well-developed and well-nourished.  Musculoskeletal: He exhibits tenderness.       Feet:  Neurological: He is alert and oriented to person,  place, and time.  Skin: Skin is warm and dry. No erythema.    ED Course  Procedures (including critical care time) Labs Review Labs Reviewed - No data to display  Imaging Review Dg Foot Complete Right  06/13/2014   CLINICAL DATA:  Right foot pain and swelling  EXAM: RIGHT FOOT COMPLETE - 3+ VIEW  COMPARISON:  None.  FINDINGS: The right foot demonstrates no fracture or dislocation. Mild osteoarthritis of the first MTP joint. Mild osteoarthritis of the talonavicular joint. There is no soft tissue abnormality. There is no subcutaneous emphysema or radiopaque foreign bodies.  IMPRESSION: No acute osseous injury of the right foot.   Electronically Signed   By: Kathreen Devoid   On: 06/13/2014 11:32   X-rays reviewed and report per radiologist.   MDM   1. Osteoarthritis of ankle and foot, right        Billy Fischer, MD 06/13/14 1146

## 2014-06-13 NOTE — ED Notes (Signed)
PT  HAS  PAIN     R  FOOT  WITH  SOME  SWELLING  AS WELL  NOTICED  YEST       NO  KNOWN     SPECEFIC  INJURY

## 2014-06-13 NOTE — Discharge Instructions (Signed)
Use shoe, ice and medicine as needed, see orthopedist if further problems.

## 2014-07-04 ENCOUNTER — Other Ambulatory Visit: Payer: Self-pay | Admitting: Vascular Surgery

## 2014-07-04 LAB — BUN: BUN: 14 mg/dL (ref 6–23)

## 2014-07-04 LAB — CREATININE, SERUM: Creat: 1.21 mg/dL (ref 0.50–1.35)

## 2014-07-07 ENCOUNTER — Encounter: Payer: Self-pay | Admitting: Vascular Surgery

## 2014-07-08 ENCOUNTER — Other Ambulatory Visit: Payer: Self-pay | Admitting: *Deleted

## 2014-07-08 ENCOUNTER — Ambulatory Visit
Admission: RE | Admit: 2014-07-08 | Discharge: 2014-07-08 | Disposition: A | Discharge status: Home / Self Care | Payer: Medicare Other | Source: Ambulatory Visit | Attending: Vascular Surgery | Admitting: Vascular Surgery

## 2014-07-08 ENCOUNTER — Ambulatory Visit: Payer: Medicare Other | Admitting: Vascular Surgery

## 2014-07-08 DIAGNOSIS — Z48812 Encounter for surgical aftercare following surgery on the circulatory system: Secondary | ICD-10-CM

## 2014-07-08 DIAGNOSIS — I714 Abdominal aortic aneurysm, without rupture, unspecified: Secondary | ICD-10-CM

## 2014-07-21 ENCOUNTER — Encounter: Payer: Self-pay | Admitting: Vascular Surgery

## 2014-07-22 ENCOUNTER — Ambulatory Visit (INDEPENDENT_AMBULATORY_CARE_PROVIDER_SITE_OTHER): Payer: Medicare Other | Admitting: Vascular Surgery

## 2014-07-22 ENCOUNTER — Ambulatory Visit
Admission: RE | Admit: 2014-07-22 | Discharge: 2014-07-22 | Disposition: A | Discharge status: Home / Self Care | Payer: Medicare Other | Source: Ambulatory Visit | Attending: Vascular Surgery | Admitting: Vascular Surgery

## 2014-07-22 ENCOUNTER — Encounter: Payer: Self-pay | Admitting: Vascular Surgery

## 2014-07-22 VITALS — BP 184/94 | HR 60 | Resp 16 | Ht 74.0 in | Wt 212.0 lb

## 2014-07-22 DIAGNOSIS — I714 Abdominal aortic aneurysm, without rupture, unspecified: Secondary | ICD-10-CM

## 2014-07-22 DIAGNOSIS — Z48812 Encounter for surgical aftercare following surgery on the circulatory system: Secondary | ICD-10-CM

## 2014-07-22 MED ORDER — IOHEXOL 350 MG/ML SOLN
80.0000 mL | Freq: Once | INTRAVENOUS | Status: AC | PRN
  Administered 2014-07-22: 80 mL via INTRAVENOUS

## 2014-07-22 NOTE — Addendum Note (Signed)
Addended by: Mena Goes on: 07/22/2014 04:58 PM   Modules accepted: Orders

## 2014-07-22 NOTE — Progress Notes (Signed)
Subjective:     Patient ID: Rodney [name], male   DOB: December 14, 1940, 73 y.o.   MRN: [id number]  HPI this 73 year old male returns for continued followup regarding his aortic stent graft for abdominal aortic aneurysm. He denies any abdominal or back symptoms. He is ambulating well playing golf frequently. He recently had left shoulder rotator cuff surgery. He denies claudication symptoms.  Past Medical History  Diagnosis Date  . Hypertension   . Hyperlipidemia   . Gout   . CAD (coronary artery disease)     cath 2010  . AAA (abdominal aortic aneurysm)     stent graft put in 02/2009  . Arthritis     History  Substance Use Topics  . Smoking status: Current Every Day Smoker -- 1.00 packs/day for 50 years    Types: Cigarettes  . Smokeless tobacco: Never Used  . Alcohol Use: 4.2 - 4.8 oz/week    6 Cans of beer, 1-2 Shots of liquor per week     Comment: occ    Family History  Problem Relation Age of Onset  . Diabetes Sister   . Hypertension Sister   . Cancer Father     prostate  . Hypertension Mother     Allergies  Allergen Reactions  . Iohexol      Code: RASH, Desc: PAITENT STATED THAT HE BEGAN ITCHING TOWARDS END OF INJECTIN OF IV CONTRAST-- 13 HR PREP RECOMMENDED/MMS     Current outpatient prescriptions:aspirin 81 MG tablet, Take 81 mg by mouth daily., Disp: , Rfl: ;  docusate sodium (COLACE) 100 MG capsule, Take 1 capsule (100 mg total) by mouth 2 (two) times daily. Continue this while taking narcotics to help with bowel movements, Disp: 30 capsule, Rfl: 1;  fish oil-omega-3 fatty acids 1000 MG capsule, Take 2 g by mouth daily., Disp: , Rfl:  lisinopril (PRINIVIL,ZESTRIL) 10 MG tablet, Take 20 mg by mouth daily. , Disp: , Rfl: ;  Multiple Vitamin (MULTIVITAMIN) capsule, Take 1 capsule by mouth daily., Disp: , Rfl: ;  rosuvastatin (CRESTOR) 10 MG tablet, Take 40 mg by mouth daily. , Disp: , Rfl: ;  chlorthalidone (HYGROTON) 25 MG tablet, Take 25 mg by mouth daily. Take 1/2 tablet  daily, Disp: , Rfl:  meloxicam (MOBIC) 7.5 MG tablet, Take 1 tablet (7.5 mg total) by mouth 2 (two) times daily after a meal., Disp: 30 tablet, Rfl: 1;  ondansetron (ZOFRAN) 4 MG tablet, Take 1 tablet (4 mg total) by mouth every 8 (eight) hours as needed for nausea., Disp: 60 tablet, Rfl: 0;  oxyCODONE-acetaminophen (ROXICET) 5-325 MG per tablet, Take 2 tablets by mouth every 4 (four) hours as needed., Disp: 90 tablet, Rfl: 0  BP 184/94  Pulse 60  Resp 16  Ht 6\' 2"  (1.88 m)  Wt 212 lb (96.163 kg)  BMI 27.21 kg/m2  Body mass index is 27.21 kg/(m^2).          Review of Systems denies chest pain, dyspnea on exertion, PND, orthopnea, claudication. Patient did recently have left rotator cuff surgery and is doing well from that standpoint. All other systems negative and a complete review of systems    Objective:   Physical Exam BP 184/94  Pulse 60  Resp 16  Ht 6\' 2"  (1.88 m)  Wt 212 lb (96.163 kg)  BMI 27.21 kg/m2  Gen.-alert and oriented x3 in no apparent distress HEENT normal for age Lungs no rhonchi or wheezing Cardiovascular regular rhythm no murmurs carotid pulses 3+ palpable no bruits  audible Abdomen soft nontender no palpable masses Musculoskeletal free of  major deformities Skin clear -no rashes Neurologic normal Lower extremities 3+ femoral and dorsalis pedis pulses palpable bilaterally with no edema  Today I ordered a CT angiogram which are reviewed by computer. The aneurysm sac is stable at 3.6 cm maximum diameter with no endoleak. There is a small amount of mural thrombus posteriorly just proximal to the stent graf the renal arteries. There is some concentric mural thrombus in the right common iliac artery within the stent graft but there is excellent flow throughout.        Assessment:     Doing well post aortic stent graft for abdominal aortic aneurysm with mild mural thrombus posterior aspect of aorta proximal to the stent graft-no endoleak    Plan:      Return in one year with duplex scan of the abdominal aortic aneurysm stent graft be performed in our office

## 2015-01-03 ENCOUNTER — Inpatient Hospital Stay (HOSPITAL_COMMUNITY)
Admission: EM | Admit: 2015-01-03 | Discharge: 2015-01-05 | DRG: 292 | Disposition: A | Discharge status: Home / Self Care | Payer: Medicare Other | Attending: Internal Medicine | Admitting: Internal Medicine

## 2015-01-03 ENCOUNTER — Encounter (HOSPITAL_COMMUNITY): Payer: Self-pay | Admitting: Emergency Medicine

## 2015-01-03 ENCOUNTER — Emergency Department (HOSPITAL_COMMUNITY): Payer: Medicare Other

## 2015-01-03 DIAGNOSIS — Z833 Family history of diabetes mellitus: Secondary | ICD-10-CM

## 2015-01-03 DIAGNOSIS — I248 Other forms of acute ischemic heart disease: Secondary | ICD-10-CM | POA: Diagnosis present

## 2015-01-03 DIAGNOSIS — Z7982 Long term (current) use of aspirin: Secondary | ICD-10-CM

## 2015-01-03 DIAGNOSIS — I340 Nonrheumatic mitral (valve) insufficiency: Secondary | ICD-10-CM | POA: Diagnosis present

## 2015-01-03 DIAGNOSIS — F17210 Nicotine dependence, cigarettes, uncomplicated: Secondary | ICD-10-CM | POA: Diagnosis present

## 2015-01-03 DIAGNOSIS — R778 Other specified abnormalities of plasma proteins: Secondary | ICD-10-CM | POA: Insufficient documentation

## 2015-01-03 DIAGNOSIS — R0602 Shortness of breath: Secondary | ICD-10-CM | POA: Diagnosis present

## 2015-01-03 DIAGNOSIS — M1990 Unspecified osteoarthritis, unspecified site: Secondary | ICD-10-CM | POA: Diagnosis present

## 2015-01-03 DIAGNOSIS — Z91041 Radiographic dye allergy status: Secondary | ICD-10-CM

## 2015-01-03 DIAGNOSIS — Z8679 Personal history of other diseases of the circulatory system: Secondary | ICD-10-CM | POA: Diagnosis not present

## 2015-01-03 DIAGNOSIS — Z8249 Family history of ischemic heart disease and other diseases of the circulatory system: Secondary | ICD-10-CM

## 2015-01-03 DIAGNOSIS — E785 Hyperlipidemia, unspecified: Secondary | ICD-10-CM | POA: Diagnosis present

## 2015-01-03 DIAGNOSIS — I10 Essential (primary) hypertension: Secondary | ICD-10-CM | POA: Diagnosis present

## 2015-01-03 DIAGNOSIS — I2489 Other forms of acute ischemic heart disease: Secondary | ICD-10-CM | POA: Diagnosis present

## 2015-01-03 DIAGNOSIS — Z8042 Family history of malignant neoplasm of prostate: Secondary | ICD-10-CM

## 2015-01-03 DIAGNOSIS — I2510 Atherosclerotic heart disease of native coronary artery without angina pectoris: Secondary | ICD-10-CM | POA: Diagnosis present

## 2015-01-03 DIAGNOSIS — I471 Supraventricular tachycardia: Secondary | ICD-10-CM | POA: Diagnosis not present

## 2015-01-03 DIAGNOSIS — I5031 Acute diastolic (congestive) heart failure: Secondary | ICD-10-CM | POA: Diagnosis present

## 2015-01-03 DIAGNOSIS — I509 Heart failure, unspecified: Secondary | ICD-10-CM

## 2015-01-03 DIAGNOSIS — Z9889 Other specified postprocedural states: Secondary | ICD-10-CM

## 2015-01-03 DIAGNOSIS — R0600 Dyspnea, unspecified: Secondary | ICD-10-CM

## 2015-01-03 DIAGNOSIS — D696 Thrombocytopenia, unspecified: Secondary | ICD-10-CM | POA: Insufficient documentation

## 2015-01-03 DIAGNOSIS — Z79891 Long term (current) use of opiate analgesic: Secondary | ICD-10-CM

## 2015-01-03 DIAGNOSIS — I5041 Acute combined systolic (congestive) and diastolic (congestive) heart failure: Principal | ICD-10-CM | POA: Diagnosis present

## 2015-01-03 DIAGNOSIS — Z79899 Other long term (current) drug therapy: Secondary | ICD-10-CM

## 2015-01-03 DIAGNOSIS — R7989 Other specified abnormal findings of blood chemistry: Secondary | ICD-10-CM

## 2015-01-03 LAB — CBC WITH DIFFERENTIAL/PLATELET
Basophils Absolute: 0 10*3/uL (ref 0.0–0.1)
Basophils Relative: 1 % (ref 0–1)
EOS ABS: 0.1 10*3/uL (ref 0.0–0.7)
Eosinophils Relative: 2 % (ref 0–5)
HEMATOCRIT: 46.9 % (ref 39.0–52.0)
HEMOGLOBIN: 16 g/dL (ref 13.0–17.0)
Lymphocytes Relative: 32 % (ref 12–46)
Lymphs Abs: 1.5 10*3/uL (ref 0.7–4.0)
MCH: 31.6 pg (ref 26.0–34.0)
MCHC: 34.1 g/dL (ref 30.0–36.0)
MCV: 92.7 fL (ref 78.0–100.0)
Monocytes Absolute: 0.3 10*3/uL (ref 0.1–1.0)
Monocytes Relative: 7 % (ref 3–12)
NEUTROS ABS: 2.7 10*3/uL (ref 1.7–7.7)
Neutrophils Relative %: 58 % (ref 43–77)
Platelets: 105 10*3/uL — ABNORMAL LOW (ref 150–400)
RBC: 5.06 MIL/uL (ref 4.22–5.81)
RDW: 13.6 % (ref 11.5–15.5)
WBC: 4.7 10*3/uL (ref 4.0–10.5)

## 2015-01-03 LAB — BASIC METABOLIC PANEL
Anion gap: 11 (ref 5–15)
BUN: 13 mg/dL (ref 6–23)
CALCIUM: 8.8 mg/dL (ref 8.4–10.5)
CO2: 19 mmol/L (ref 19–32)
Chloride: 108 mmol/L (ref 96–112)
Creatinine, Ser: 1.22 mg/dL (ref 0.50–1.35)
GFR calc non Af Amer: 57 mL/min — ABNORMAL LOW (ref >90)
GFR, EST AFRICAN AMERICAN: 67 mL/min — AB (ref >90)
GLUCOSE: 106 mg/dL — AB (ref 70–99)
Potassium: 4.2 mmol/L (ref 3.5–5.1)
Sodium: 138 mmol/L (ref 135–145)

## 2015-01-03 LAB — BRAIN NATRIURETIC PEPTIDE
B Natriuretic Peptide: 331.1 pg/mL — ABNORMAL HIGH (ref 0.0–100.0)
B Natriuretic Peptide: 539.5 pg/mL — ABNORMAL HIGH (ref 0.0–100.0)

## 2015-01-03 LAB — TROPONIN I
TROPONIN I: 0.07 ng/mL — AB (ref <0.031)
Troponin I: 0.05 ng/mL — ABNORMAL HIGH (ref <0.031)
Troponin I: 0.07 ng/mL — ABNORMAL HIGH (ref <0.031)
Troponin I: 0.08 ng/mL — ABNORMAL HIGH (ref <0.031)

## 2015-01-03 MED ORDER — ACETAMINOPHEN 325 MG PO TABS: 650.0000 mg | ORAL_TABLET | Freq: Four times a day (QID) | ORAL | Status: DC | PRN

## 2015-01-03 MED ORDER — ADULT MULTIVITAMIN W/MINERALS CH
1.0000 | ORAL_TABLET | Freq: Every day | ORAL | Status: DC
Start: 2015-01-03 — End: 2015-01-05
  Administered 2015-01-03 – 2015-01-05 (×3): 1 via ORAL
  Filled 2015-01-03 (×3): qty 1

## 2015-01-03 MED ORDER — ONDANSETRON HCL 4 MG/2ML IJ SOLN: 4.0000 mg | Freq: Four times a day (QID) | INTRAMUSCULAR | Status: DC | PRN

## 2015-01-03 MED ORDER — ONDANSETRON HCL 4 MG PO TABS: 4.0000 mg | ORAL_TABLET | Freq: Four times a day (QID) | ORAL | Status: DC | PRN

## 2015-01-03 MED ORDER — ENALAPRILAT 1.25 MG/ML IV SOLN
1.2500 mg | Freq: Once | INTRAVENOUS | Status: AC
  Administered 2015-01-03: 1.25 mg via INTRAVENOUS
  Filled 2015-01-03: qty 1

## 2015-01-03 MED ORDER — OMEGA-3-ACID ETHYL ESTERS 1 G PO CAPS
1.0000 g | ORAL_CAPSULE | Freq: Every day | ORAL | Status: DC
  Administered 2015-01-03 – 2015-01-05 (×3): 1 g via ORAL
  Filled 2015-01-03 (×3): qty 1

## 2015-01-03 MED ORDER — LISINOPRIL 20 MG PO TABS
20.0000 mg | ORAL_TABLET | Freq: Every day | ORAL | Status: DC
  Administered 2015-01-03 – 2015-01-05 (×3): 20 mg via ORAL
  Filled 2015-01-03 (×3): qty 1

## 2015-01-03 MED ORDER — ACETAMINOPHEN 650 MG RE SUPP: 650.0000 mg | Freq: Four times a day (QID) | RECTAL | Status: DC | PRN

## 2015-01-03 MED ORDER — SODIUM CHLORIDE 0.9 % IV SOLN: INTRAVENOUS | Status: DC

## 2015-01-03 MED ORDER — CHLORTHALIDONE 25 MG PO TABS
25.0000 mg | ORAL_TABLET | Freq: Every day | ORAL | Status: DC
  Filled 2015-01-03: qty 1

## 2015-01-03 MED ORDER — ROSUVASTATIN CALCIUM 40 MG PO TABS
40.0000 mg | ORAL_TABLET | Freq: Every day | ORAL | Status: DC
  Administered 2015-01-03 – 2015-01-05 (×3): 40 mg via ORAL
  Filled 2015-01-03 (×3): qty 1

## 2015-01-03 MED ORDER — MULTIVITAMINS PO CAPS: 1.0000 | ORAL_CAPSULE | Freq: Every day | ORAL | Status: DC

## 2015-01-03 MED ORDER — ASPIRIN EC 81 MG PO TBEC
81.0000 mg | DELAYED_RELEASE_TABLET | Freq: Every day | ORAL | Status: DC
  Administered 2015-01-03 – 2015-01-05 (×3): 81 mg via ORAL
  Filled 2015-01-03 (×3): qty 1

## 2015-01-03 MED ORDER — SODIUM CHLORIDE 0.9 % IJ SOLN
3.0000 mL | Freq: Two times a day (BID) | INTRAMUSCULAR | Status: DC
  Administered 2015-01-03 – 2015-01-05 (×4): 3 mL via INTRAVENOUS

## 2015-01-03 MED ORDER — FUROSEMIDE 10 MG/ML IJ SOLN
20.0000 mg | Freq: Once | INTRAMUSCULAR | Status: AC
  Administered 2015-01-03: 20 mg via INTRAVENOUS
  Filled 2015-01-03: qty 4

## 2015-01-03 MED ORDER — MORPHINE SULFATE 2 MG/ML IJ SOLN: 1.0000 mg | INTRAMUSCULAR | Status: DC | PRN

## 2015-01-03 MED ORDER — OMEGA-3 FATTY ACIDS 1000 MG PO CAPS: 2.0000 g | ORAL_CAPSULE | Freq: Every day | ORAL | Status: DC

## 2015-01-03 MED ORDER — FUROSEMIDE 10 MG/ML IJ SOLN
20.0000 mg | Freq: Every day | INTRAMUSCULAR | Status: DC
  Administered 2015-01-04 – 2015-01-05 (×2): 20 mg via INTRAVENOUS
  Filled 2015-01-03 (×2): qty 2

## 2015-01-03 NOTE — H&P (Signed)
[facility] History and Physical  Rodney [name] RCV:[id number] DOB: [birth year] DOA: 01/03/2015  Referring physician: ER physician PCP: Rodney Jacobson, MD   Chief Complaint: shortness of breath   HPI:  74 y.o. male with past medical history of hypertension, CAD, smoker who presented to [facility] ED with complaints of worsening shortness of breath since the morning prior to the admission associated with some wheezing, cough productive of clear sputum, congestion, nasal discharge but no fevers or chills. No chest pain or palpitations. Pt reported using Vicks nasal rub with no significant symptomatic relief. Pt cant recall similar symptoms in past. No aggravating factors or alleviating factors. No reports of abdominal pain, nausea or vomiting. No blood in stool or urine. No falls. No lightheadedness or loss of consciousness. In ED, BP was 183/100, HR 74, RR 18, T max 97.8 F and oxygen saturation 94% on room air. Blood work showed platelet count of 105, troponin level 0.05, no other lab abnormalities. The 12 lead EKG showed sinus rhythm. CXR showed bibasilar airspace disease representing pulmonary edema versus bibasilar pneumonia. Cardiology has seen the pt in consultation. Pt received 1 dose of lasix 20 mg IV and vasotec 1.25 mg once.   Assessment & Plan    Principal Problem:   Accelerated hypertension - pt is compliant with home BP meds; all home BP meds were resumed on admission  - will follow up on cardiology recommendations - monitor on telemetry unit  Active Problems:  Shortness of breath / Acute diastolic CHF (congestive heart failure), NYHA class 2 - shortness of breath likely form CHF rather than pneumonia. Pt with no fever, no elevated WBC so CHF likely contributing to shortness of breath. - BNP on this admission 539 - will follow up on 2 D ECHO results - since he has gotten lasix 20 mg IV once in ED< will resume this dose daily - appreciate cardio input - daily weight and  replete electrolytes as needed    Troponin elevation / Demand ischemia / CAD (coronary artery disease) - pt has history of subtotal OM occlusion with mild to moderate CAD of remaining vessels and EF around 40% in 2012 by cardiac cath.  - troponin elevation likely due to combination of accelerated hypertension, CHF and CAD - given aspirin in ED; will follow up with cardio if pt needs to be heparinized  - cardio consulted - morphine given for pain control - oxygen ordered if needed to keep O2 sats above 90% - no complaints of chest pain     Mild thrombocytopenia - unclear etiology - using SCD's for DVT prophylaxis   DVT prophylaxis:  - SCD's bilaterally due to mild thrombocytopenia   Radiological Exams on Admission: Dg Chest 2 View 01/03/2015   Bibasilar airspace disease representing pulmonary edema versus bibasilar pneumonia.   Electronically Signed   By: Suzy Bouchard M.D.   On: 01/03/2015 07:36    EKG: sinus rhythm  Code Status: Full Family Communication: Plan of care discussed with the patient  Disposition Plan: Admit for further evaluation  Rodney Lenz, MD  [facility] Pager [phone]  Review of Systems:  Constitutional: Negative for fever, chills and malaise/fatigue. Negative for diaphoresis.  HENT: Negative for hearing loss, ear pain, nosebleeds.   Eyes: Negative for blurred vision, double vision, photophobia, pain, discharge and redness.  Respiratory: per HPI.   Cardiovascular: Negative for chest pain, palpitations, orthopnea, claudication and leg swelling.  Gastrointestinal: Negative for nausea, vomiting and abdominal pain. Negative for heartburn, constipation, blood  in stool and melena.  Genitourinary: Negative for dysuria, urgency, frequency, hematuria and flank pain.  Musculoskeletal: Negative for myalgias, back pain, joint pain and falls.  Skin: Negative for itching and rash.  Neurological: Negative for dizziness and weakness. Negative for tingling,  tremors, sensory change, speech change, focal weakness, loss of consciousness and headaches.  Endo/Heme/Allergies: Negative for environmental allergies and polydipsia. Does not bruise/bleed easily.  Psychiatric/Behavioral: Negative for suicidal ideas. The patient is not nervous/anxious.      Past Medical History  Diagnosis Date  . Hypertension   . Hyperlipidemia   . Gout   . CAD (coronary artery disease)     cath 2010  . AAA (abdominal aortic aneurysm)     stent graft put in 02/2009  . Arthritis    Past Surgical History  Procedure Laterality Date  . Abdominal aortic aneurysm repair  02/19/2009    performed by [facility]  . Colonoscopy    . Knee arthroscopy  2003    left  . Shoulder arthroscopy with rotator cuff repair and subacromial decompression Left 11/19/2013    Procedure: LEFT SHOULDER ARTHROSCOPY DEBRIDEMENT EXTENTSIVE DISTAL CLAVICULECTOMY DECOMPRESSION PARTIAL ACROMIOPLASTY WITH CORACOACROMIAL WITH ROTATOR CUFF REPAIR ;  Surgeon: Renette Butters, MD;  Location: [place];  Service: Orthopedics;  Laterality: Left;   Social History:  reports that he has been smoking Cigarettes.  He has a 50 pack-year smoking history. He has never used smokeless tobacco. He reports that he drinks about 4.2 - 4.8 oz of alcohol per week. He reports that he does not use illicit drugs.  Allergies  Allergen Reactions  . Iohexol      Code: RASH, Desc: PAITENT STATED THAT HE BEGAN ITCHING TOWARDS END OF INJECTIN OF IV CONTRAST-- 13 HR PREP RECOMMENDED/MMS     Family History:  Family History  Problem Relation Age of Onset  . Diabetes Sister   . Hypertension Sister   . Cancer Father     prostate  . Hypertension Mother      Prior to Admission medications   Medication Sig Start Date End Date Taking? Authorizing Provider  aspirin 81 MG tablet Take 81 mg by mouth daily.   Yes Historical Provider, MD  chlorthalidone (HYGROTON) 25 MG tablet Take 12.5 mg by mouth daily.    Yes Historical  Provider, MD  fish oil-omega-3 fatty acids 1000 MG capsule Take 2 g by mouth daily.   Yes Historical Provider, MD  lisinopril (PRINIVIL,ZESTRIL) 20 MG tablet Take 20 mg by mouth daily.   Yes Historical Provider, MD  Multiple Vitamin (MULTIVITAMIN) capsule Take 1 capsule by mouth daily.   Yes Historical Provider, MD  rosuvastatin (CRESTOR) 10 MG tablet Take 40 mg by mouth daily.    Yes Historical Provider, MD  docusate sodium (COLACE) 100 MG capsule Take 1 capsule (100 mg total) by mouth 2 (two) times daily. Continue this while taking narcotics to help with bowel movements Patient not taking: Reported on 01/03/2015 11/19/13   Renette Butters, MD  lisinopril (PRINIVIL,ZESTRIL) 10 MG tablet Take 10 mg by mouth daily.     Historical Provider, MD  meloxicam (MOBIC) 7.5 MG tablet Take 1 tablet (7.5 mg total) by mouth 2 (two) times daily after a meal. Patient not taking: Reported on 01/03/2015 06/13/14   Billy Fischer, MD  ondansetron (ZOFRAN) 4 MG tablet Take 1 tablet (4 mg total) by mouth every 8 (eight) hours as needed for nausea. Patient not taking: Reported on 01/03/2015 11/19/13  Renette Butters, MD  oxyCODONE-acetaminophen (ROXICET) 5-325 MG per tablet Take 2 tablets by mouth every 4 (four) hours as needed. Patient not taking: Reported on 01/03/2015 11/19/13   Renette Butters, MD   Physical Exam: Filed Vitals:   01/03/15 0930 01/03/15 1000 01/03/15 1030 01/03/15 1050  BP: 158/93 184/92 174/116   Pulse: 58 65    Temp:    97.8 F (36.6 C)  TempSrc:    Oral  Resp: 13 20 16    Height:      Weight:      SpO2: 100% 100%      Physical Exam  Constitutional: Appears well-developed and well-nourished. No distress.  HENT: Normocephalic. No tonsillar erythema or exudates Eyes: Conjunctivae and EOM are normal. PERRLA, no scleral icterus.  Neck: Normal ROM. Neck supple. No JVD. No tracheal deviation. No thyromegaly.  CVS: RRR, S1/S2 +, no murmurs, no gallops, no carotid bruit.  Pulmonary: Effort and  breath sounds normal, no stridor, rhonchi, wheezes, rales.  Abdominal: Soft. BS +,  no distension, tenderness, rebound or guarding.  Musculoskeletal: Normal range of motion. No edema and no tenderness.  Lymphadenopathy: No lymphadenopathy noted, cervical, inguinal. Neuro: Alert. Normal reflexes, muscle tone coordination. No focal neurologic deficits. Skin: Skin is warm and dry. No rash noted.  No erythema. No pallor.  Psychiatric: Normal mood and affect. Behavior, judgment, thought content normal.   Labs on Admission:  Basic Metabolic Panel:  Recent Labs Lab 01/03/15 0714  NA 138  K 4.2  CL 108  CO2 19  GLUCOSE 106*  BUN 13  CREATININE 1.22  CALCIUM 8.8   Liver Function Tests: No results for input(s): AST, ALT, ALKPHOS, BILITOT, PROT, ALBUMIN in the last 168 hours. No results for input(s): LIPASE, AMYLASE in the last 168 hours. No results for input(s): AMMONIA in the last 168 hours. CBC:  Recent Labs Lab 01/03/15 0714  WBC 4.7  NEUTROABS 2.7  HGB 16.0  HCT 46.9  MCV 92.7  PLT 105*   Cardiac Enzymes:  Recent Labs Lab 01/03/15 0714 01/03/15 1011  TROPONINI 0.05* 0.07*   BNP: Invalid input(s): POCBNP CBG: No results for input(s): GLUCAP in the last 168 hours.  If 7PM-7AM, please contact night-coverage [email or web address] Password [facility] 01/03/2015, 11:22 AM

## 2015-01-03 NOTE — ED Notes (Signed)
Attending at bedside for update.

## 2015-01-03 NOTE — Consult Note (Addendum)
CONSULTATION NOTE  Reason for Consult: Malignant hypertension, elevated troponin  Requesting Physician: Dr. Charlies Silvers  Cardiologist: Dr. Acie Fredrickson  CC: Short of breath  HPI: This is a 74 y.o. male with a past medical history significant for CAD, hypertension, dyslipidemia, and AAA s/p stent-grafting in 2012 by Dr. Kellie Simmering. Preoperatively, he underwent a stress test which was abnormal, showing an inferior defect consistent with possible scar and mild to moderate LV dysfunction. He had a LHC, which revealed the following:  ANGIOGRAPHY: Left main: The left main is fairly normal.  The left anterior descending artery has some proximal luminal irregularities between 20 and 30%. The mid vessel stenosis around 30- 40%. It gives off several small diagonal arteries. The first diagonal artery is moderate in size and has mild irregularities. The second diagonal artery is very tiny and has an 80% proximal stenosis. This vessel is quite small and is not a candidate for PTCA.  The remaining LAD has only minor luminal irregularities.  The left circumflex artery is a large vessel. It gives off a very high obtuse marginal artery. The remaining circumflex artery continues around the A-V groove and supplies a posterolateral branch.  The first obtuse marginal artery is subtotally occluded at 2 different sites. There was sluggish TIMI grade 1 flow through this vessel. This vessel reaches around the lateral wall and supplies a small amount of the inferior left ventricle. This was quite likely where the site of the abnormal Cardiolite.  The right coronary artery is large and dominant. There are minor luminal irregularities. The posterior descending artery and the posterolateral segment artery, they taper fairly quickly at the takeoff. This taper improved slightly with intracoronary nitroglycerin. There are only minor luminal irregularities between 20 and  30%.  The left ventriculogram was performed in a 30-RAO position. It reveals a moderately dilated and hypocontractile left ventricle. The ejection fraction is probably 40%.  During this left ventriculogram, we noticed that he had a very dilated aortic root. We performed an aortic root angiography, which revealed a dilated aortic root. There was no evidence of aortic insufficiency.  He now presents with accelerated hypertension and dyspnea, but no chest pain  -troponin is borderline elevated at 0.05 and 0.07, BNP is elevated at 331. CXR demonstrates pulmonary edema. EKG shows sinus rhythm with LVH and lateral repolarization changes.  Cardiology is asked to consult regarding management.  PMHx:  Past Medical History  Diagnosis Date  . Hypertension   . Hyperlipidemia   . Gout   . CAD (coronary artery disease)     cath 2010  . AAA (abdominal aortic aneurysm)     stent graft put in 02/2009  . Arthritis    Past Surgical History  Procedure Laterality Date  . Abdominal aortic aneurysm repair  02/19/2009    performed by [facility]  . Colonoscopy    . Knee arthroscopy  2003    left  . Shoulder arthroscopy with rotator cuff repair and subacromial decompression Left 11/19/2013    Procedure: LEFT SHOULDER ARTHROSCOPY DEBRIDEMENT EXTENTSIVE DISTAL CLAVICULECTOMY DECOMPRESSION PARTIAL ACROMIOPLASTY WITH CORACOACROMIAL WITH ROTATOR CUFF REPAIR ;  Surgeon: Renette Butters, MD;  Location: [facility];  Service: Orthopedics;  Laterality: Left;    FAMHx: Family History  Problem Relation Age of Onset  . Diabetes Sister   . Hypertension Sister   . Cancer Father     prostate  . Hypertension Mother     SOCHx:  reports that he has been smoking Cigarettes.  He  has a 50 pack-year smoking history. He has never used smokeless tobacco. He reports that he drinks about 4.2 - 4.8 oz of alcohol per week. He reports that he does not use illicit drugs.  ALLERGIES: Allergies  Allergen  Reactions  . Iohexol      Code: RASH, Desc: PAITENT STATED THAT HE BEGAN ITCHING TOWARDS END OF INJECTIN OF IV CONTRAST-- 13 HR PREP RECOMMENDED/MMS     ROS: A comprehensive review of systems was negative except for: Cardiovascular: positive for dyspnea  HOME MEDICATIONS:   Medication List    ASK your doctor about these medications        aspirin 81 MG tablet  Take 81 mg by mouth daily.     chlorthalidone 25 MG tablet  Commonly known as:  HYGROTON  Take 12.5 mg by mouth daily.     docusate sodium 100 MG capsule  Commonly known as:  COLACE  Take 1 capsule (100 mg total) by mouth 2 (two) times daily. Continue this while taking narcotics to help with bowel movements     fish oil-omega-3 fatty acids 1000 MG capsule  Take 2 g by mouth daily.     lisinopril 10 MG tablet  Commonly known as:  PRINIVIL,ZESTRIL  Take 10 mg by mouth daily.     lisinopril 20 MG tablet  Commonly known as:  PRINIVIL,ZESTRIL  Take 20 mg by mouth daily.     meloxicam 7.5 MG tablet  Commonly known as:  MOBIC  Take 1 tablet (7.5 mg total) by mouth 2 (two) times daily after a meal.     multivitamin capsule  Take 1 capsule by mouth daily.     ondansetron 4 MG tablet  Commonly known as:  ZOFRAN  Take 1 tablet (4 mg total) by mouth every 8 (eight) hours as needed for nausea.     oxyCODONE-acetaminophen 5-325 MG per tablet  Commonly known as:  ROXICET  Take 2 tablets by mouth every 4 (four) hours as needed.     rosuvastatin 10 MG tablet  Commonly known as:  CRESTOR  Take 40 mg by mouth daily.       HOSPITAL MEDICATIONS: I have reviewed the patient's current medications.  VITALS: Blood pressure 163/99, pulse 69, temperature 97.8 F (36.6 C), temperature source Oral, resp. rate 18, height '6\' 2"'  (1.88 m), weight 212 lb (96.163 kg), SpO2 98 %.  PHYSICAL EXAM: General appearance: alert, no distress and lying flat Neck: JVD - 3 cm above sternal notch and no carotid bruit Lungs: rales  bibasilar Heart: regular rate and rhythm Abdomen: soft, non-tender; bowel sounds normal; no masses,  no organomegaly Extremities: extremities normal, atraumatic, no cyanosis or edema Pulses: 2+ and symmetric Skin: Skin color, texture, turgor normal. No rashes or lesions Neurologic: Grossly normal Psych: Pleasant  LABS: Results for orders placed or performed during the hospital encounter of 01/03/15 (from the past 48 hour(s))  Basic metabolic panel     Status: Abnormal   Collection Time: 01/03/15  7:14 AM  Result Value Ref Range   Sodium 138 135 - 145 mmol/L   Potassium 4.2 3.5 - 5.1 mmol/L   Chloride 108 96 - 112 mmol/L   CO2 19 19 - 32 mmol/L   Glucose, Bld 106 (H) 70 - 99 mg/dL   BUN 13 6 - 23 mg/dL   Creatinine, Ser 1.22 0.50 - 1.35 mg/dL   Calcium 8.8 8.4 - 10.5 mg/dL   GFR calc non Af Amer 57 (L) >90 mL/min  GFR calc Af Amer 67 (L) >90 mL/min    Comment: (NOTE) The eGFR has been calculated using the CKD EPI equation. This calculation has not been validated in all clinical situations. eGFR's persistently <90 mL/min signify possible Chronic Kidney Disease.    Anion gap 11 5 - 15  CBC with Differential     Status: Abnormal   Collection Time: 01/03/15  7:14 AM  Result Value Ref Range   WBC 4.7 4.0 - 10.5 K/uL   RBC 5.06 4.22 - 5.81 MIL/uL   Hemoglobin 16.0 13.0 - 17.0 g/dL   HCT 46.9 39.0 - 52.0 %   MCV 92.7 78.0 - 100.0 fL   MCH 31.6 26.0 - 34.0 pg   MCHC 34.1 30.0 - 36.0 g/dL   RDW 13.6 11.5 - 15.5 %   Platelets 105 (L) 150 - 400 K/uL    Comment: SPECIMEN CHECKED FOR CLOTS REPEATED TO VERIFY PLATELET COUNT CONFIRMED BY SMEAR    Neutrophils Relative % 58 43 - 77 %   Neutro Abs 2.7 1.7 - 7.7 K/uL   Lymphocytes Relative 32 12 - 46 %   Lymphs Abs 1.5 0.7 - 4.0 K/uL   Monocytes Relative 7 3 - 12 %   Monocytes Absolute 0.3 0.1 - 1.0 K/uL   Eosinophils Relative 2 0 - 5 %   Eosinophils Absolute 0.1 0.0 - 0.7 K/uL   Basophils Relative 1 0 - 1 %   Basophils Absolute  0.0 0.0 - 0.1 K/uL  Troponin I     Status: Abnormal   Collection Time: 01/03/15  7:14 AM  Result Value Ref Range   Troponin I 0.05 (H) <0.031 ng/mL    Comment:        PERSISTENTLY INCREASED TROPONIN VALUES IN THE RANGE OF 0.04-0.49 ng/mL CAN BE SEEN IN:       -UNSTABLE ANGINA       -CONGESTIVE HEART FAILURE       -MYOCARDITIS       -CHEST TRAUMA       -ARRYHTHMIAS       -LATE PRESENTING MYOCARDIAL INFARCTION       -COPD   CLINICAL FOLLOW-UP RECOMMENDED.   Brain natriuretic peptide     Status: Abnormal   Collection Time: 01/03/15  7:14 AM  Result Value Ref Range   B Natriuretic Peptide 331.1 (H) 0.0 - 100.0 pg/mL  Troponin I     Status: Abnormal   Collection Time: 01/03/15 10:11 AM  Result Value Ref Range   Troponin I 0.07 (H) <0.031 ng/mL    Comment:        PERSISTENTLY INCREASED TROPONIN VALUES IN THE RANGE OF 0.04-0.49 ng/mL CAN BE SEEN IN:       -UNSTABLE ANGINA       -CONGESTIVE HEART FAILURE       -MYOCARDITIS       -CHEST TRAUMA       -ARRYHTHMIAS       -LATE PRESENTING MYOCARDIAL INFARCTION       -COPD   CLINICAL FOLLOW-UP RECOMMENDED.     IMAGING: Dg Chest 2 View  01/03/2015   CLINICAL DATA:  Short of breath. History of abdominal aortic aneurysm repair  EXAM: CHEST  2 VIEW  COMPARISON:  Radiograph 02/03/2013 CT abdomen and 11,015  FINDINGS: Mild enlarged cardiac silhouette compared to prior. Bibasilar airspace disease which has a fine diffuse pattern. Trace pleural effusions noted on lateral projection. No pneumothorax. Degenerate spurring of the spine.  IMPRESSION: Bibasilar airspace disease representing  pulmonary edema versus bibasilar pneumonia.   Electronically Signed   By: Suzy Bouchard M.D.   On: 01/03/2015 07:36    HOSPITAL DIAGNOSES: Principal Problem:   Accelerated hypertension Active Problems:   S/P abdominal aortic aneurysm repair   Acute diastolic CHF (congestive heart failure), NYHA class 2   Demand  ischemia   IMPRESSION/RECOMMENDATION: 1. Accelerated hypertension - acute onset without precipitating factors- no chest pain, did not miss meds, had a small piece of ham the other day.  Was on lisinopril 20 mg only - previously took nifedipine. Will review echo - EKG shows LVH with repolarization changes, therefore has diastolic dysfunction - would benefit from B-blocker as well as diuretic. 2. Demand ischemia - known subtotal OM occlusion with mild to moderate CAD of remaining vessels and EF around 40% in 2012 by cath. Suspect troponin elevation due to a combination of accelerated hypertension, known CAD and demand ishcemia + diastolic CHF. No chest pain - reasonable to consider lexiscan stress test while he is an inpatient. 3. Acute diastolic CHF- almost certainly the cause of his dyspnea - BNP elevated, CXR consistent with CHF - likely from "flash" pulmonary edema in the setting of diastolic dysfunction. Brief review of the echo at bedside shows EF around 40%, which is consistent with prior studies. Agree with diuretics - he was receiving lasix in the ER when I was examining him.  Thanks for consulting [place]. Cardiology will follow along with you.  Time Spent Directly with Patient: 30 minutes  [name], MD, [facility] Attending Cardiologist [facility]  Song Myre C 01/03/2015, 12:43 PM

## 2015-01-03 NOTE — ED Provider Notes (Signed)
CSN: [id number]     Arrival date & time 01/03/15  0631 History   First MD Initiated Contact with Patient 01/03/15 [phone]     Chief Complaint  Patient presents with  . Shortness of Breath   Rodney [name] is a 74 y.o. male with a history of HTN, AAA, CAD and a smoker who presents to the ED complaining of shortness of breath since 3 am this morning. Patient also reports a cough for 1.5 years that has been worse in last week associated with nasal congestion, post nasal drip, and some wheezing. He reports productive cough with yellow and clear sputum. He reports increased shortness of breath when walking to the bathroom earlier. He reports using vicks nasal rub today with no relief. He denies fevers, chills, chest pain, palpitations, sore throat, leg swelling, ear pain, eye pain. He denies personal or family history of DVTs or PEs. He denies personal or family history of blood clotting disorders. He denies recent surgeries or long travel.   (Consider location/radiation/quality/duration/timing/severity/associated sxs/prior Treatment) HPI  Past Medical History  Diagnosis Date  . Hypertension   . Hyperlipidemia   . Gout   . CAD (coronary artery disease)     cath 2010  . AAA (abdominal aortic aneurysm)     stent graft put in 02/2009  . Arthritis    Past Surgical History  Procedure Laterality Date  . Abdominal aortic aneurysm repair  02/19/2009    performed by [facility]  . Colonoscopy    . Knee arthroscopy  2003    left  . Shoulder arthroscopy with rotator cuff repair and subacromial decompression Left 11/19/2013    Procedure: LEFT SHOULDER ARTHROSCOPY DEBRIDEMENT EXTENTSIVE DISTAL CLAVICULECTOMY DECOMPRESSION PARTIAL ACROMIOPLASTY WITH CORACOACROMIAL WITH ROTATOR CUFF REPAIR ;  Surgeon: Renette Butters, MD;  Location: [facility];  Service: Orthopedics;  Laterality: Left;   Family History  Problem Relation Age of Onset  . Diabetes Sister   . Hypertension Sister   . Cancer Father    prostate  . Hypertension Mother    History  Substance Use Topics  . Smoking status: Current Every Day Smoker -- 1.00 packs/day for 50 years    Types: Cigarettes  . Smokeless tobacco: Never Used  . Alcohol Use: 4.2 - 4.8 oz/week    6 Cans of beer, 1-2 Shots of liquor per week     Comment: occ    Review of Systems  Constitutional: Negative for fever and chills.  HENT: Positive for congestion, postnasal drip and rhinorrhea. Negative for ear pain, sore throat and trouble swallowing.   Eyes: Negative for pain and visual disturbance.  Respiratory: Positive for cough, shortness of breath and wheezing. Negative for chest tightness.   Cardiovascular: Negative for chest pain, palpitations and leg swelling.  Gastrointestinal: Negative for nausea, vomiting, abdominal pain and diarrhea.  Genitourinary: Negative for dysuria.  Musculoskeletal: Negative for back pain and neck pain.  Skin: Negative for rash.  Neurological: Negative for light-headedness and headaches.      Allergies  Iohexol  Home Medications   Prior to Admission medications   Medication Sig Start Date End Date Taking? Authorizing Provider  aspirin 81 MG tablet Take 81 mg by mouth daily.   Yes Historical Provider, MD  chlorthalidone (HYGROTON) 25 MG tablet Take 12.5 mg by mouth daily.    Yes Historical Provider, MD  fish oil-omega-3 fatty acids 1000 MG capsule Take 2 g by mouth daily.   Yes Historical Provider, MD  lisinopril (PRINIVIL,ZESTRIL) 20  MG tablet Take 20 mg by mouth daily.   Yes Historical Provider, MD  Multiple Vitamin (MULTIVITAMIN) capsule Take 1 capsule by mouth daily.   Yes Historical Provider, MD  rosuvastatin (CRESTOR) 10 MG tablet Take 40 mg by mouth daily.    Yes Historical Provider, MD  docusate sodium (COLACE) 100 MG capsule Take 1 capsule (100 mg total) by mouth 2 (two) times daily. Continue this while taking narcotics to help with bowel movements Patient not taking: Reported on 01/03/2015 11/19/13    Renette Butters, MD  lisinopril (PRINIVIL,ZESTRIL) 10 MG tablet Take 10 mg by mouth daily.     Historical Provider, MD  meloxicam (MOBIC) 7.5 MG tablet Take 1 tablet (7.5 mg total) by mouth 2 (two) times daily after a meal. Patient not taking: Reported on 01/03/2015 06/13/14   Billy Fischer, MD  ondansetron (ZOFRAN) 4 MG tablet Take 1 tablet (4 mg total) by mouth every 8 (eight) hours as needed for nausea. Patient not taking: Reported on 01/03/2015 11/19/13   Renette Butters, MD  oxyCODONE-acetaminophen (ROXICET) 5-325 MG per tablet Take 2 tablets by mouth every 4 (four) hours as needed. Patient not taking: Reported on 01/03/2015 11/19/13   Renette Butters, MD   BP 174/116 mmHg  Pulse 65  Temp(Src) 97.8 F (36.6 C) (Oral)  Resp 16  Ht 6\' 2"  (1.88 m)  Wt 212 lb (96.163 kg)  BMI 27.21 kg/m2  SpO2 100% Physical Exam  Constitutional: He is oriented to person, place, and time. He appears well-developed and well-nourished. No distress.  Non-toxic appearing.   HENT:  Head: Normocephalic and atraumatic.  Right Ear: External ear normal.  Left Ear: External ear normal.  Mouth/Throat: Oropharynx is clear and moist. No oropharyngeal exudate.  Nasal turbinates are boggy and swollen. Bilateral tympanic membranes are pearly-gray without erythema or loss of landmarks.  Eyes: Conjunctivae are normal. Pupils are equal, round, and reactive to light. Right eye exhibits no discharge. Left eye exhibits no discharge.  Neck: Neck supple. No JVD present.  Cardiovascular: Normal rate, regular rhythm, normal heart sounds and intact distal pulses.  Exam reveals no gallop and no friction rub.   No murmur heard. Pulmonary/Chest: Effort normal and breath sounds normal. No respiratory distress. He has no wheezes. He has no rales.  Oxygen saturation is 98%.   Abdominal: Soft. He exhibits no distension. There is no tenderness.  Musculoskeletal: He exhibits no edema.  No lower extremity edema. No tenderness in bilateral  calves.   Lymphadenopathy:    He has no cervical adenopathy.  Neurological: He is alert and oriented to person, place, and time. Coordination normal.  Skin: Skin is warm and dry. No rash noted. He is not diaphoretic. No erythema. No pallor.  Psychiatric: He has a normal mood and affect. His behavior is normal.  Nursing note and vitals reviewed.   ED Course  Procedures (including critical care time) Labs Review Labs Reviewed  BASIC METABOLIC PANEL - Abnormal; Notable for the following:    Glucose, Bld 106 (*)    GFR calc non Af Amer 57 (*)    GFR calc Af Amer 67 (*)    All other components within normal limits  CBC WITH DIFFERENTIAL/PLATELET - Abnormal; Notable for the following:    Platelets 105 (*)    All other components within normal limits  TROPONIN I - Abnormal; Notable for the following:    Troponin I 0.05 (*)    All other components within normal limits  BRAIN NATRIURETIC PEPTIDE - Abnormal; Notable for the following:    B Natriuretic Peptide 331.1 (*)    All other components within normal limits  TROPONIN I - Abnormal; Notable for the following:    Troponin I 0.07 (*)    All other components within normal limits    Imaging Review Dg Chest 2 View  01/03/2015   CLINICAL DATA:  Short of breath. History of abdominal aortic aneurysm repair  EXAM: CHEST  2 VIEW  COMPARISON:  Radiograph 02/03/2013 CT abdomen and 11,015  FINDINGS: Mild enlarged cardiac silhouette compared to prior. Bibasilar airspace disease which has a fine diffuse pattern. Trace pleural effusions noted on lateral projection. No pneumothorax. Degenerate spurring of the spine.  IMPRESSION: Bibasilar airspace disease representing pulmonary edema versus bibasilar pneumonia.   Electronically Signed   By: Suzy Bouchard M.D.   On: 01/03/2015 07:36     EKG Interpretation   Date/Time:  Saturday January 03 2015 07:11:23 EST Ventricular Rate:  67 PR Interval:  183 QRS Duration: 97 QT Interval:  457 QTC  Calculation: 482 R Axis:   32 Text Interpretation:  Sinus rhythm Probable left atrial enlargement LVH  with secondary repolarization abnormality Borderline prolonged QT interval  Confirmed by Jeneen Rinks  MD, [place] ([id number]) on 01/03/2015 7:25:56 AM      Filed Vitals:   01/03/15 0930 01/03/15 1000 01/03/15 1030 01/03/15 1050  BP: 158/93 184/92 174/116   Pulse: 58 65    Temp:    97.8 F (36.6 C)  TempSrc:    Oral  Resp: 13 20 16    Height:      Weight:      SpO2: 100% 100%       MDM   Meds given in ED:  Medications  furosemide (LASIX) injection 20 mg (not administered)  enalaprilat (VASOTEC) injection 1.25 mg (1.25 mg Intravenous Given 01/03/15 1111)    New Prescriptions   No medications on file    Final diagnoses:  Shortness of breath  Congestive heart failure, unspecified congestive heart failure chronicity, unspecified congestive heart failure type   This is a 74 year-old with a history of HTN, AAA, and CAD who presents to the ED complaining of shortness of breath since 3 am this morning. Patient also reports a cough for 1.5 years that has been worse in last week associated with nasal congestion, post nasal drip, and some wheezing. Patient is afebrile and non-toxic appearing. Lungs are clear to ascultation bilaterally. No apparent respiratory distress. The patient is not tachypneic, tachycardic or hypoxic. No lower extremity edema. Chest x-ray shows bibasilar airspace disease representing pulmonary edema vs bibasilar pneumonia. EKG is normal sinus rhythm with probably left atrial enlargement with secondary repolarization abnormality. Borderline prolonged QT interval. No good prior EKG to compare. Initial troponin is 0.05. BNP is elevated slightly at 331. CBC and BMP are unremarkable. Patient's second troponin is 0.07. Would like to admit this patient to rule out ACS. Patient is accepted for admission by Dr. Charlies Silvers. She would like me to speak to cardiology. Spoke with cardiologist Dr.  Johnsie Cancel who would like an IV diuretic and said he would be by [place] for consult as soon as he was able.   This patient was discussed with and evaluated by Dr. Jeneen Rinks who agrees with assessment and plan.      Hanley Hays, PA-C 01/03/15 [place], MD 01/04/15 [phone]

## 2015-01-03 NOTE — ED Notes (Signed)
Cardiologist at the bedside reviewing 2D echo with technician.

## 2015-01-03 NOTE — ED Notes (Addendum)
Patient will not be able to be upstairs on the unit within 20 minutes of bed assignment due to procedure in progress at the bedside. Charge nurse Sonia Baller, RN notified of delay. 2 D echo in progress.

## 2015-01-03 NOTE — ED Notes (Signed)
Patient woke up and was not able to breath. Patient stated he put Rolly ointment up his nose to help him breath. Patient is not complaining of pain.

## 2015-01-03 NOTE — Progress Notes (Signed)
  Echocardiogram 2D Echocardiogram has been performed.  Lysle Rubens 01/03/2015, 12:40 PM

## 2015-01-04 DIAGNOSIS — I340 Nonrheumatic mitral (valve) insufficiency: Secondary | ICD-10-CM

## 2015-01-04 DIAGNOSIS — I248 Other forms of acute ischemic heart disease: Secondary | ICD-10-CM

## 2015-01-04 DIAGNOSIS — I5031 Acute diastolic (congestive) heart failure: Secondary | ICD-10-CM

## 2015-01-04 LAB — COMPREHENSIVE METABOLIC PANEL
ALT: 26 U/L (ref 0–53)
AST: 22 U/L (ref 0–37)
Albumin: 3.9 g/dL (ref 3.5–5.2)
Alkaline Phosphatase: 63 U/L (ref 39–117)
Anion gap: 7 (ref 5–15)
BILIRUBIN TOTAL: 0.6 mg/dL (ref 0.3–1.2)
BUN: 15 mg/dL (ref 6–23)
CHLORIDE: 108 mmol/L (ref 96–112)
CO2: 25 mmol/L (ref 19–32)
CREATININE: 1.27 mg/dL (ref 0.50–1.35)
Calcium: 8.8 mg/dL (ref 8.4–10.5)
GFR calc Af Amer: 63 mL/min — ABNORMAL LOW (ref >90)
GFR calc non Af Amer: 55 mL/min — ABNORMAL LOW (ref >90)
Glucose, Bld: 115 mg/dL — ABNORMAL HIGH (ref 70–99)
POTASSIUM: 3.5 mmol/L (ref 3.5–5.1)
Sodium: 140 mmol/L (ref 135–145)
TOTAL PROTEIN: 6.4 g/dL (ref 6.0–8.3)

## 2015-01-04 LAB — TROPONIN I: TROPONIN I: 0.07 ng/mL — AB (ref <0.031)

## 2015-01-04 LAB — GLUCOSE, CAPILLARY: GLUCOSE-CAPILLARY: 125 mg/dL — AB (ref 70–99)

## 2015-01-04 LAB — CBC
HCT: 42.3 % (ref 39.0–52.0)
Hemoglobin: 14.7 g/dL (ref 13.0–17.0)
MCH: 31.7 pg (ref 26.0–34.0)
MCHC: 34.8 g/dL (ref 30.0–36.0)
MCV: 91.2 fL (ref 78.0–100.0)
PLATELETS: 107 10*3/uL — AB (ref 150–400)
RBC: 4.64 MIL/uL (ref 4.22–5.81)
RDW: 13.5 % (ref 11.5–15.5)
WBC: 4.3 10*3/uL (ref 4.0–10.5)

## 2015-01-04 MED ORDER — ISOSORBIDE MONONITRATE ER 30 MG PO TB24
30.0000 mg | ORAL_TABLET | Freq: Every day | ORAL | Status: DC
  Administered 2015-01-04 – 2015-01-05 (×2): 30 mg via ORAL
  Filled 2015-01-04 (×2): qty 1

## 2015-01-04 NOTE — Progress Notes (Signed)
Patient ID: Rodney [name], male   DOB: 08-23-1941, 74 y.o.   MRN: [id number] [facility] PROGRESS NOTE  Rodney [name] IRS:[id number] DOB: [birth year] DOA: 01/03/2015 PCP: [name], MD  Brief narrative:    74 y.o. male with past medical history of hypertension, CAD, smoker who presented to [facility] ED with complaints of worsening shortness of breath since the morning prior to the admission associated with wheezing, cough, congestion, nasal discharge but no fevers or chills. No chest pain or palpitations. In ED, BP was 183/100. Blood work showed platelet count of 105, troponin level 0.05, no other lab abnormalities. The 12 lead EKG showed sinus rhythm. CXR showed bibasilar airspace disease representing pulmonary edema versus bibasilar pneumonia. Cardiology has seen the pt in consultation. Pt received 1 dose of lasix 20 mg IV and vasotec 1.25 mg once.  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 Pt with no fever, no elevated WBC so CHF likely contributing to shortness of breath. - BNP on this admission 539 - on this admission 2 D ECHO results - EF 40-45%  continue lasix 20 mg IV daily; pt had about 2.3 L negative fluid balance - daily weight and replete electrolytes as needed - cardiology following   Troponin elevation / Demand ischemia / CAD (coronary artery disease) - pt has history of subtotal OM occlusion with mild to moderate CAD of remaining vessels and EF around 40% in 2012 by cardiac cath.  - troponin elevation likely due to combination of accelerated hypertension, CHF and CAD - troponin level reached plateau to 0.08 - normal  sinus rhythm on 12 lead EKG - continue daily aspirin - cant have BB due to borderline low HR - continue Crestor  - appreciate cardio following   Mild thrombocytopenia - unclear etiology - no evidence of bleeding - platelets stable at 107 - using SCD's for DVT prophylaxis   Code Status: Full.  Family Communication:  plan of care discussed with the patient Disposition Plan: Home when stable.   IV access:  Peripheral IV  Procedures and diagnostic studies:    Dg Chest 2 View 01/03/2015    Bibasilar airspace disease representing pulmonary edema versus bibasilar pneumonia.      Medical Consultants:  Cardiology   Other Consultants:  None   IAnti-Infectives:   None    [name], MD  [facility] Pager [phone]  If 7PM-7AM, please contact night-coverage [email or web address] Password [email or web address] 01/04/2015, 12:02 PM   LOS: 1 day    HPI/Subjective: No acute overnight events.  Objective: Filed Vitals:   01/03/15 1640 01/03/15 1935 01/04/15 0504 01/04/15 1009  BP: 143/76 155/79 161/95 146/89  Pulse:  65 63   Temp:  97.4 F (36.3 C) 97.9 F (36.6 C)   TempSrc:  Axillary Oral   Resp:  18 18   Height:   6\' 2"  (1.88 m)   Weight:   93.8 kg (206 lb 12.7 oz)   SpO2:  97% 99%     Intake/Output Summary (Last 24 hours) at 01/04/15 1202 Last data filed at 01/04/15 0700  Gross per 24 hour  Intake   1323 ml  Output  3300 ml  Net  -1977 ml    Exam:   General:  Pt is alert, follows commands appropriately, not in acute distress  Cardiovascular: Regular rate and rhythm, S1/S2, no murmurs  Respiratory: Clear to auscultation bilaterally, no wheezing, no crackles, no rhonchi  Abdomen: Soft, non tender, non distended, bowel sounds present  Extremities: No edema, pulses DP and PT palpable bilaterally  Neuro: Grossly nonfocal  Data Reviewed: Basic Metabolic Panel:  Recent Labs Lab 01/03/15 0714 01/04/15 0112  NA 138 140  K 4.2 3.5  CL 108 108  CO2 19 25   GLUCOSE 106* 115*  BUN 13 15  CREATININE 1.22 1.27  CALCIUM 8.8 8.8   Liver Function Tests:  Recent Labs Lab 01/04/15 0112  AST 22  ALT 26  ALKPHOS 63  BILITOT 0.6  PROT 6.4  ALBUMIN 3.9   No results for input(s): LIPASE, AMYLASE in the last 168 hours. No results for input(s): AMMONIA in the last 168 hours. CBC:  Recent Labs Lab 01/03/15 0714 01/04/15 0112  WBC 4.7 4.3  NEUTROABS 2.7  --   HGB 16.0 14.7  HCT 46.9 42.3  MCV 92.7 91.2  PLT 105* 107*   Cardiac Enzymes:  Recent Labs Lab 01/03/15 0714 01/03/15 1011 01/03/15 1354 01/03/15 1912 01/04/15 0112  TROPONINI 0.05* 0.07* 0.07* 0.08* 0.07*   BNP: Invalid input(s): POCBNP CBG:  Recent Labs Lab 01/04/15 0741  GLUCAP 125*    No results found for this or any previous visit (from the past 240 hour(s)).   Scheduled Meds: . aspirin EC  81 mg Oral Daily  . furosemide  20 mg Intravenous Daily  . isosorbide mononitrate  30 mg Oral Daily  . lisinopril  20 mg Oral Daily  . multivitamin   1 tablet Oral Daily  . omega-3 acid ethyl   1 g Oral Daily  . rosuvastatin  40 mg Oral Daily   Continuous Infusions: . sodium chloride

## 2015-01-04 NOTE — Progress Notes (Signed)
Primary cardiologist: Dr. Mertie Moores  Seen for followup: Accelerated hypertension, demand ischemia  Subjective:    No chest pain or shortness of breath at rest. No palpitations. Feels better.  Objective:   Temp:  [97.4 F (36.3 C)-97.9 F (36.6 C)] 97.9 F (36.6 C) (01/24 0504) Pulse Rate:  [57-69] 63 (01/24 0504) Resp:  [0-22] 18 (01/24 0504) BP: (143-184)/(76-116) 161/95 mmHg (01/24 0504) SpO2:  [97 %-100 %] 99 % (01/24 0504) Weight:  [206 lb 12.7 oz (93.8 kg)] 206 lb 12.7 oz (93.8 kg) (01/24 0504) Last BM Date: 01/03/15  Filed Weights   01/03/15 0639 01/04/15 0504  Weight: 212 lb (96.163 kg) 206 lb 12.7 oz (93.8 kg)    Intake/Output Summary (Last 24 hours) at 01/04/15 0748 Last data filed at 01/04/15 0700  Gross per 24 hour  Intake   1323 ml  Output   3300 ml  Net  -1977 ml    Telemetry: Normal sinus rhythm.  Exam:  General: Appears comfortable at rest.  Lungs: Clear, nonlabored.  Cardiac: RRR, soft systolic murmur, no gallop.  Abdomen: NABS.  Extremities: No pitting edema.   Lab Results:  Basic Metabolic Panel:  Recent Labs Lab 01/03/15 0714 01/04/15 0112  NA 138 140  K 4.2 3.5  CL 108 108  CO2 19 25  GLUCOSE 106* 115*  BUN 13 15  CREATININE 1.22 1.27  CALCIUM 8.8 8.8    Liver Function Tests:  Recent Labs Lab 01/04/15 0112  AST 22  ALT 26  ALKPHOS 63  BILITOT 0.6  PROT 6.4  ALBUMIN 3.9    CBC:  Recent Labs Lab 01/03/15 0714 01/04/15 0112  WBC 4.7 4.3  HGB 16.0 14.7  HCT 46.9 42.3  MCV 92.7 91.2  PLT 105* 107*    Cardiac Enzymes:  Recent Labs Lab 01/03/15 1354 01/03/15 1912 01/04/15 0112  TROPONINI 0.07* 0.08* 0.07*    Echocardiogram 01/03/2015: Study Conclusions  - Left ventricle: The cavity size was normal. Wall thickness was increased in a pattern of moderate LVH. Systolic function was mildly to moderately reduced. The estimated ejection fraction was in the range of 40% to 45%. There is  inferior and lateral hypokinesis to akinesis. Doppler parameters are consistent with abnormal left ventricular relaxation (grade 1 diastolic dysfunction). The E/e&' ratio is between 8-15, suggesting indeterminate (but likely elevated) LV filling pressure. - Mitral valve: Thickening and sclerosis of the mitral leaflets. There appears to be a flail subvalvular cord noted in the LV, which is associated with the anterior leaflet - it does not prolapse. There is mild to moderate anteriorly directed mitral regurgitation. - Left atrium: The atrium was at the upper limits of normal in size. - Tricuspid valve: There was mild regurgitation. - Pulmonary arteries: PA peak pressure: 45 mm Hg (S). - Inferior vena cava: The vessel was normal in size. The respirophasic diameter changes were in the normal range (= 50%), consistent with normal central venous pressure.  Impressions:  - LVEF 40-45%, inferior and lateral hypokinesis to akinesis, thickened and sclerotic mitral leaflets with a mobile anterior subvalvular structure which is likely a ruptured cord - there is associated mild to moderate anteriorly directed mitral regurgitation, mild TR, RVSP 45 mmHg, diastolic dysfunction and elevated LV filling pressure. If the partially flail mitral valve apparatus is new, this could explain his acute pulmonary edema. Consider TEE for better visualization of the mitral valve structure.   Medications:   Scheduled Medications: . aspirin EC  81 mg Oral Daily  .  furosemide  20 mg Intravenous Daily  . lisinopril  20 mg Oral Daily  . multivitamin with minerals  1 tablet Oral Daily  . omega-3 acid ethyl esters  1 g Oral Daily  . rosuvastatin  40 mg Oral Daily  . sodium chloride  3 mL Intravenous Q12H     Infusions: . sodium chloride       PRN Medications:  acetaminophen **OR** acetaminophen, morphine injection, ondansetron **OR** ondansetron (ZOFRAN)  IV   Assessment:   1. Minor increase in troponin I, relatively flat pattern, suggestive of demand ischemia rather than ACS. He denies any chest pain. ECG shows LVH with repolarization abnormalities.  2. Presentation with relatively acute shortness of breath, accelerated hypertension, and pulmonary edema. Much improved with IV Lasix. Echocardiogram shows stable LV dysfunction with LVEF 40-45%. There is a flail cord within the subvalvular mitral apparatus associated with mild to moderate mitral regurgitation by report, PASP 45 mmHg. Seems unlikely that this degree of mitral regurgitation would be specifically contributing to the symptoms. This will need to be followed to make sure that it is not changing quickly however.  3. Exploratory hypertension. Patient reports compliance with his medications.  4. History of CAD status post cardiac catheterization in 2012 demonstrating relatively mild multivessel CAD with the exception of a tiny second diagonal branch with 80% stenosis, and subtotally occluded first obtuse marginal branch. He has been managed medically.  5. Ongoing tobacco abuse. Smoking cessation discussed.   Plan/Discussion:    Continue aspirin, lisinopril, and Crestor. Agree with IV Lasix, it is relatively low dose, but he has had a very good diuresis so far and is feeling better. Not clear that we need to pursue a Myoview at this time. Would add Imdur, follow blood pressure trend, may need higher dose lisinopril as well. Not on beta blocker with relatively low resting heart rate. Have him ambulate to see how he does from a symptom perspective.  Satira Sark, M.D., F.A.C.C.

## 2015-01-04 NOTE — Progress Notes (Signed)
Patient had run of SVT this afternoon with heart rate in the 150s. BP 137/67, HR quickly returned to the 60s in NSR. Patient experienced no discomfort. Said he was up to the bathroom at the time. Dr. Charlies Silvers made aware. Will continue to monitor patient.

## 2015-01-05 DIAGNOSIS — I2510 Atherosclerotic heart disease of native coronary artery without angina pectoris: Secondary | ICD-10-CM

## 2015-01-05 LAB — GLUCOSE, CAPILLARY: Glucose-Capillary: 119 mg/dL — ABNORMAL HIGH (ref 70–99)

## 2015-01-05 MED ORDER — LISINOPRIL 20 MG PO TABS: 20.0000 mg | ORAL_TABLET | Freq: Every day | ORAL | Status: DC

## 2015-01-05 MED ORDER — FUROSEMIDE 20 MG PO TABS: 20.0000 mg | ORAL_TABLET | Freq: Every day | ORAL | Status: DC

## 2015-01-05 MED ORDER — OMEGA-3-ACID ETHYL ESTERS 1 G PO CAPS: 1.0000 g | ORAL_CAPSULE | Freq: Every day | ORAL | Status: DC

## 2015-01-05 MED ORDER — CARVEDILOL 3.125 MG PO TABS: 3.1250 mg | ORAL_TABLET | Freq: Two times a day (BID) | ORAL | Status: DC

## 2015-01-05 MED ORDER — POTASSIUM CHLORIDE ER 10 MEQ PO TBCR
10.0000 meq | EXTENDED_RELEASE_TABLET | Freq: Every day | ORAL | Status: DC
Start: 2015-01-05 — End: 2015-01-05

## 2015-01-05 MED ORDER — POTASSIUM CHLORIDE ER 10 MEQ PO TBCR: 10.0000 meq | EXTENDED_RELEASE_TABLET | Freq: Every day | ORAL | Status: DC

## 2015-01-05 MED ORDER — ISOSORBIDE MONONITRATE ER 30 MG PO TB24: 30.0000 mg | ORAL_TABLET | Freq: Every day | ORAL | Status: DC

## 2015-01-05 NOTE — Progress Notes (Signed)
Patient verbalized understanding of discharge instructions. Patient is stable at discharge. 

## 2015-01-05 NOTE — Progress Notes (Addendum)
Primary cardiologist: Dr. Mertie Moores  Seen for followup: Accelerated hypertension, demand ischemia  Subjective:    No chest pain or shortness of breath at rest. No palpitations. Feels better.  Objective:   Temp:  [97.5 F (36.4 C)-98 F (36.7 C)] 97.5 F (36.4 C) (01/25 0620) Pulse Rate:  [61-67] 61 (01/25 0620) Resp:  [16-18] 18 (01/25 0620) BP: (133-158)/(67-92) 158/92 mmHg (01/25 0620) SpO2:  [97 %-99 %] 98 % (01/25 0620) Weight:  [204 lb 12.9 oz (92.9 kg)] 204 lb 12.9 oz (92.9 kg) (01/25 0620) Last BM Date: 01/03/15  Filed Weights   01/03/15 0639 01/04/15 0504 01/05/15 0620  Weight: 212 lb (96.163 kg) 206 lb 12.7 oz (93.8 kg) 204 lb 12.9 oz (92.9 kg)    Intake/Output Summary (Last 24 hours) at 01/05/15 0911 Last data filed at 01/05/15 0500  Gross per 24 hour  Intake    243 ml  Output    250 ml  Net     -7 ml    Telemetry: Normal sinus rhythm. T wave inversion noted.   Exam:  General: Appears comfortable at rest.  Lungs: Clear, nonlabored.  Cardiac: RRR, soft systolic murmur, no gallop.  Abdomen: NABS.  Extremities: No pitting edema.   Lab Results:  Basic Metabolic Panel:  Recent Labs Lab 01/03/15 0714 01/04/15 0112  NA 138 140  K 4.2 3.5  CL 108 108  CO2 19 25  GLUCOSE 106* 115*  BUN 13 15  CREATININE 1.22 1.27  CALCIUM 8.8 8.8    Liver Function Tests:  Recent Labs Lab 01/04/15 0112  AST 22  ALT 26  ALKPHOS 63  BILITOT 0.6  PROT 6.4  ALBUMIN 3.9    CBC:  Recent Labs Lab 01/03/15 0714 01/04/15 0112  WBC 4.7 4.3  HGB 16.0 14.7  HCT 46.9 42.3  MCV 92.7 91.2  PLT 105* 107*    Cardiac Enzymes:  Recent Labs Lab 01/03/15 1354 01/03/15 1912 01/04/15 0112  TROPONINI 0.07* 0.08* 0.07*    Echocardiogram 01/03/2015: Study Conclusions  - Left ventricle: The cavity size was normal. Wall thickness was increased in a pattern of moderate LVH. Systolic function was mildly to moderately reduced. The estimated  ejection fraction was in the range of 40% to 45%. There is inferior and lateral hypokinesis to akinesis. Doppler parameters are consistent with abnormal left ventricular relaxation (grade 1 diastolic dysfunction). The E/e&' ratio is between 8-15, suggesting indeterminate (but likely elevated) LV filling pressure. - Mitral valve: Thickening and sclerosis of the mitral leaflets. There appears to be a flail subvalvular cord noted in the LV, which is associated with the anterior leaflet - it does not prolapse. There is mild to moderate anteriorly directed mitral regurgitation. - Left atrium: The atrium was at the upper limits of normal in size. - Tricuspid valve: There was mild regurgitation. - Pulmonary arteries: PA peak pressure: 45 mm Hg (S). - Inferior vena cava: The vessel was normal in size. The respirophasic diameter changes were in the normal range (= 50%), consistent with normal central venous pressure.  Impressions:  - LVEF 40-45%, inferior and lateral hypokinesis to akinesis, thickened and sclerotic mitral leaflets with a mobile anterior subvalvular structure which is likely a ruptured cord - there is associated mild to moderate anteriorly directed mitral regurgitation, mild TR, RVSP 45 mmHg, diastolic dysfunction and elevated LV filling pressure. If the partially flail mitral valve apparatus is new, this could explain his acute pulmonary edema. Consider TEE for better visualization of the  mitral valve structure.   Medications:   Scheduled Medications: . aspirin EC  81 mg Oral Daily  . furosemide  20 mg Intravenous Daily  . isosorbide mononitrate  30 mg Oral Daily  . lisinopril  20 mg Oral Daily  . multivitamin with minerals  1 tablet Oral Daily  . omega-3 acid ethyl esters  1 g Oral Daily  . rosuvastatin  40 mg Oral Daily  . sodium chloride  3 mL Intravenous Q12H    Infusions: . sodium chloride      PRN  Medications: acetaminophen **OR** acetaminophen, morphine injection, ondansetron **OR** ondansetron (ZOFRAN) IV   Assessment:   1. Minor increase in troponin I, relatively flat pattern, suggestive of demand ischemia rather than ACS. He denies any chest pain. ECG shows LVH with repolarization abnormalities.  2. Presentation with relatively acute shortness of breath, accelerated hypertension, and pulmonary edema. Much improved with IV Lasix. Echocardiogram shows stable LV dysfunction with LVEF 40-45%. There is a flail cord within the subvalvular mitral apparatus associated with mild to moderate mitral regurgitation by report, PASP 45 mmHg. Seems unlikely that this degree of mitral regurgitation would be specifically contributing to the symptoms. This will need to be followed to make sure that it is not changing quickly however.  3. Essential hypertension. Patient reports compliance with his medications.  4. History of CAD status post cardiac catheterization in 2012 demonstrating relatively mild multivessel CAD with the exception of a tiny second diagonal branch with 80% stenosis, and subtotally occluded first obtuse marginal branch. He has been managed medically.  5. Ongoing tobacco abuse. Smoking cessation discussed.  6. SVT - brief on tele. Adding low dose coreg.    Plan/Discussion:    Continue aspirin, lisinopril, and Crestor. Agree with IV Lasix, it is relatively low dose, but he has had a very good diuresis so far and is feeling better. Change to PO tomorrow. BP under better control.  Since recent cath as above, would not pursue a Myoview at this time unless angina develops. Added Imdur, follow blood pressure trend, may need higher dose lisinopril as well. Will give him low dose beta blocker coreg 3.125 BID but watch with relatively low resting heart rate. SVT noted on tele. Have him ambulate to see how he does from a symptom perspective.  Potential DC tomorrow.   Will sign off. Please  call if questions.   Candee Furbish, M.D., F.A.C.C.

## 2015-01-05 NOTE — Discharge Summary (Signed)
Physician Discharge Summary  Rodney [name] TIR:[id number] DOB: 1941/03/13 DOA: 01/03/2015  PCP: [name], MD  Admit date: 01/03/2015 Discharge date: 01/05/2015  Recommendations for Outpatient Follow-up:  1. Patient will continue blood pressure medications as prescribed. Patient will continue to follow with cardiology per scheduled appointment.  Discharge Diagnoses:  Principal Problem:   Accelerated hypertension Active Problems:   S/P abdominal aortic aneurysm repair   Acute diastolic CHF (congestive heart failure), NYHA class 2   Demand ischemia   CAD (coronary artery disease)    Discharge Condition: stable   Diet recommendation: as tolerated   History of present illness:  74 y.o. male with past medical history of hypertension, CAD, smoker who presented to [facility] ED with complaints of worsening shortness of breath since the morning prior to the admission associated with wheezing, cough, congestion, nasal discharge but no fevers or chills. No chest pain or palpitations. In ED, BP was 183/100. Blood work showed platelet count of 105, troponin level 0.05, no other lab abnormalities. The 12 lead EKG showed sinus rhythm. CXR showed bibasilar airspace disease representing pulmonary edema versus bibasilar pneumonia. Cardiology has seen the pt in consultation. Pt received 1 dose of lasix 20 mg IV and vasotec 1.25 mg once.  Assessment/Plan:    Principal Problem: Accelerated hypertension - Appreciate cardiology assistance and input on blood pressure medications. - Patient has received coreg 3.125 mg by mouth twice a day, imdur 30 mg daily, lisinopril 20 mg daily and low dose lasix IV - Blood pressure seems to be controlled. - Patient will continue the above medications and discharge with the exception Lasix is that of IV it will be by mouth regimen  Active Problems: Acute systolic and diastolic CHF (congestive heart failure), NYHA class 2 - shortness of breath likely form CHF  rather than pneumonia. Pt with no fever, no elevated WBC so CHF likely contributing to shortness of breath. - BNP on this admission 539. 2-D echo showed ejection fraction 40-45% - Patient has received Lasix 20 mg IV since the admission. He had good diuresis. We'll converted to 20 mg daily on discharge - daily weight and replete electrolytes as needed - cardiology following   Troponin elevation / Demand ischemia / CAD (coronary artery disease) - pt has history of subtotal OM occlusion with mild to moderate CAD of remaining vessels and EF around 40% in 2012 by cardiac cath.  - troponin elevation likely due to combination of accelerated hypertension, CHF and CAD. The 12-lead EKG on the admission showed sinus rhythm. - troponin level reached plateau to 0.08 - No complaints of chest pain this morning - continue daily aspirin - Continue Coreg 3.125 mg by mouth twice a day - continue Crestor   Mild thrombocytopenia - Unclear ideology off thrombocytopenia. No evidence of bleeding. Used SCDs for DVT prophylaxis throughout her hospital stay.   Code Status: Full.  Family Communication: plan of care discussed with the patient   IV access:  Peripheral IV  Procedures and diagnostic studies:   Dg Chest 2 View 01/03/2015 Bibasilar airspace disease representing pulmonary edema versus bibasilar pneumonia.    Medical Consultants:  Cardiology   Other Consultants:  None   IAnti-Infectives:   None    Signed:  [name], MD  [facility] 01/05/2015, 11:02 AM  Pager #: [phone]   Discharge Exam: Filed Vitals:   01/05/15 0620  BP: 158/92  Pulse: 61  Temp: 97.5 F (36.4 C)  Resp: 18   Filed Vitals:   01/04/15  1259 01/04/15 1650 01/04/15 2024 01/05/15 0620  BP: 133/83 137/67 155/76 158/92  Pulse: 67 62 66 61  Temp: 98 F (36.7 C)  98 F (36.7 C) 97.5 F (36.4 C)  TempSrc: Oral  Oral Oral  Resp: 16  18 18   Height:      Weight:    92.9 kg  (204 lb 12.9 oz)  SpO2: 99%  97% 98%    General: Pt is not in acute distress Cardiovascular: Regular rate and rhythm, S1/S2 appreciated Respiratory: Bilateral air entry, no crackles, no rhonchi, no wheezing Abdominal: Nontender, nondistended abdomen, appreciate bowel sounds Extremities: no edema, no cyanosis, pulses palpable bilaterally DP and PT Neuro: No focal deficits  Discharge Instructions  Discharge Instructions    Call MD for:  difficulty breathing, headache or visual disturbances    Complete by:  As directed      Call MD for:  persistant dizziness or light-headedness    Complete by:  As directed      Call MD for:  persistant nausea and vomiting    Complete by:  As directed      Call MD for:  severe uncontrolled pain    Complete by:  As directed      Diet - low sodium heart healthy    Complete by:  As directed      Discharge instructions    Complete by:  As directed   1. Please take blood pressure medications as prescribed. Follow-up with cardiology per scheduled appointment.     Increase activity slowly    Complete by:  As directed             Medication List    STOP taking these medications        chlorthalidone 25 MG tablet  Commonly known as:  HYGROTON     docusate sodium 100 MG capsule  Commonly known as:  COLACE     meloxicam 7.5 MG tablet  Commonly known as:  MOBIC     ondansetron 4 MG tablet  Commonly known as:  ZOFRAN     oxyCODONE-acetaminophen 5-325 MG per tablet  Commonly known as:  ROXICET      TAKE these medications        aspirin 81 MG tablet  Take 81 mg by mouth daily.     carvedilol 3.125 MG tablet  Commonly known as:  COREG  Take 1 tablet (3.125 mg total) by mouth 2 (two) times daily with a meal.     fish oil-omega-3 fatty acids 1000 MG capsule  Take 2 g by mouth daily.     furosemide 20 MG tablet  Commonly known as:  LASIX  Take 1 tablet (20 mg total) by mouth daily.     isosorbide mononitrate 30 MG 24 hr tablet  Commonly  known as:  IMDUR  Take 1 tablet (30 mg total) by mouth daily.     lisinopril 20 MG tablet  Commonly known as:  PRINIVIL,ZESTRIL  Take 1 tablet (20 mg total) by mouth daily.     multivitamin capsule  Take 1 capsule by mouth daily.     omega-3 acid ethyl esters 1 G capsule  Commonly known as:  LOVAZA  Take 1 capsule (1 g total) by mouth daily.     potassium chloride 10 MEQ tablet  Commonly known as:  K-DUR  Take 1 tablet (10 mEq total) by mouth daily.     rosuvastatin 10 MG tablet  Commonly known as:  CRESTOR  Take  40 mg by mouth daily.           Follow-up Information    Follow up with ROBERTS, Sharol Given, MD. Schedule an appointment as soon as possible for a visit in 1 month.   Specialty:  Internal Medicine   Why:  Follow up appt after recent hospitalization   Contact information:   [place], [place] [id number] [phone]        The results of significant diagnostics from this hospitalization (including imaging, microbiology, ancillary and laboratory) are listed below for reference.    Significant Diagnostic Studies: Dg Chest 2 View  01/03/2015   CLINICAL DATA:  Short of breath. History of abdominal aortic aneurysm repair  EXAM: CHEST  2 VIEW  COMPARISON:  Radiograph 02/03/2013 CT abdomen and 11,015  FINDINGS: Mild enlarged cardiac silhouette compared to prior. Bibasilar airspace disease which has a fine diffuse pattern. Trace pleural effusions noted on lateral projection. No pneumothorax. Degenerate spurring of the spine.  IMPRESSION: Bibasilar airspace disease representing pulmonary edema versus bibasilar pneumonia.   Electronically Signed   By: Suzy Bouchard M.D.   On: 01/03/2015 07:36    Microbiology: No results found for this or any previous visit (from the past 240 hour(s)).   Labs: Basic Metabolic Panel:  Recent Labs Lab 01/03/15 0714 01/04/15 0112  NA 138 140  K 4.2 3.5  CL 108 108  CO2 19 25  GLUCOSE 106* 115*  BUN 13 15   CREATININE 1.22 1.27  CALCIUM 8.8 8.8   Liver Function Tests:  Recent Labs Lab 01/04/15 0112  AST 22  ALT 26  ALKPHOS 63  BILITOT 0.6  PROT 6.4  ALBUMIN 3.9   No results for input(s): LIPASE, AMYLASE in the last 168 hours. No results for input(s): AMMONIA in the last 168 hours. CBC:  Recent Labs Lab 01/03/15 0714 01/04/15 0112  WBC 4.7 4.3  NEUTROABS 2.7  --   HGB 16.0 14.7  HCT 46.9 42.3  MCV 92.7 91.2  PLT 105* 107*   Cardiac Enzymes:  Recent Labs Lab 01/03/15 0714 01/03/15 1011 01/03/15 1354 01/03/15 1912 01/04/15 0112  TROPONINI 0.05* 0.07* 0.07* 0.08* 0.07*   BNP: BNP (last 3 results) No results for input(s): PROBNP in the last 8760 hours. CBG:  Recent Labs Lab 01/04/15 0741 01/05/15 0727  GLUCAP 125* 119*    Time coordinating discharge: Over 30 minutes

## 2015-01-05 NOTE — Discharge Instructions (Signed)

## 2015-01-14 ENCOUNTER — Telehealth: Payer: Self-pay | Admitting: Internal Medicine

## 2015-01-14 NOTE — Telephone Encounter (Signed)
Received records from Dr [name] for appointment with Dr Debara Pickett on 01/29/15.  Records given to [facility] (medical records) for Dr Lysbeth Penner schedule on 01/29/15. lp

## 2015-01-27 ENCOUNTER — Telehealth: Payer: Self-pay | Admitting: Internal Medicine

## 2015-01-27 NOTE — Telephone Encounter (Signed)
Close encounter 

## 2015-01-29 ENCOUNTER — Ambulatory Visit (INDEPENDENT_AMBULATORY_CARE_PROVIDER_SITE_OTHER): Payer: Medicare Other | Admitting: Internal Medicine

## 2015-01-29 ENCOUNTER — Encounter: Payer: Self-pay | Admitting: Internal Medicine

## 2015-01-29 VITALS — BP 160/92 | HR 63 | Ht 74.0 in | Wt 210.0 lb

## 2015-01-29 DIAGNOSIS — I2489 Other forms of acute ischemic heart disease: Secondary | ICD-10-CM

## 2015-01-29 DIAGNOSIS — Z8679 Personal history of other diseases of the circulatory system: Secondary | ICD-10-CM

## 2015-01-29 DIAGNOSIS — R778 Other specified abnormalities of plasma proteins: Secondary | ICD-10-CM

## 2015-01-29 DIAGNOSIS — I248 Other forms of acute ischemic heart disease: Secondary | ICD-10-CM

## 2015-01-29 DIAGNOSIS — Z9889 Other specified postprocedural states: Secondary | ICD-10-CM

## 2015-01-29 DIAGNOSIS — I480 Paroxysmal atrial fibrillation: Secondary | ICD-10-CM

## 2015-01-29 DIAGNOSIS — R7989 Other specified abnormal findings of blood chemistry: Secondary | ICD-10-CM

## 2015-01-29 DIAGNOSIS — I10 Essential (primary) hypertension: Secondary | ICD-10-CM

## 2015-01-29 DIAGNOSIS — I4891 Unspecified atrial fibrillation: Secondary | ICD-10-CM

## 2015-01-29 DIAGNOSIS — I2510 Atherosclerotic heart disease of native coronary artery without angina pectoris: Secondary | ICD-10-CM

## 2015-01-29 DIAGNOSIS — I5031 Acute diastolic (congestive) heart failure: Secondary | ICD-10-CM

## 2015-01-29 DIAGNOSIS — I2583 Coronary atherosclerosis due to lipid rich plaque: Secondary | ICD-10-CM

## 2015-01-29 MED ORDER — APIXABAN 5 MG PO TABS: 5.0000 mg | ORAL_TABLET | Freq: Two times a day (BID) | ORAL | Status: DC

## 2015-01-29 NOTE — Progress Notes (Signed)
OFFICE NOTE  Chief Complaint:  New onset atrial fibrillation  Primary Care Physician: [name], MD  HPI:  Rodney [name] is a 74 y.o. male with a past medical history significant for CAD, hypertension, dyslipidemia, and AAA s/p stent-grafting in 2012 by Dr. Kellie Simmering. Preoperatively, he underwent a stress test which was abnormal, showing an inferior defect consistent with possible scar and mild to moderate LV dysfunction. He had a LHC, which revealed the following:  ANGIOGRAPHY: Left main: The left main is fairly normal.  The left anterior descending artery has some proximal luminal irregularities between 20 and 30%. The mid vessel stenosis around 30- 40%. It gives off several small diagonal arteries. The first diagonal artery is moderate in size and has mild irregularities. The second diagonal artery is very tiny and has an 80% proximal stenosis. This vessel is quite small and is not a candidate for PTCA.  The remaining LAD has only minor luminal irregularities.  The left circumflex artery is a large vessel. It gives off a very high obtuse marginal artery. The remaining circumflex artery continues around the A-V groove and supplies a posterolateral branch.  The first obtuse marginal artery is subtotally occluded at 2 different sites. There was sluggish TIMI grade 1 flow through this vessel. This vessel reaches around the lateral wall and supplies a small amount of the inferior left ventricle. This was quite likely where the site of the abnormal Cardiolite.  The right coronary artery is large and dominant. There are minor luminal irregularities. The posterior descending artery and the posterolateral segment artery, they taper fairly quickly at the takeoff. This taper improved slightly with intracoronary nitroglycerin. There are only minor luminal irregularities between 20 and 30%.  The left ventriculogram was performed in  a 30-RAO position. It reveals a moderately dilated and hypocontractile left ventricle. The ejection fraction is probably 40%.  During this left ventriculogram, we noticed that he had a very dilated aortic root. We performed an aortic root angiography, which revealed a dilated aortic root. There was no evidence of aortic insufficiency.  He now presents with accelerated hypertension and dyspnea, but no chest pain -troponin is borderline elevated at 0.05 and 0.07, BNP is elevated at 331. CXR demonstrates pulmonary edema. EKG shows sinus rhythm with LVH and lateral repolarization changes. Cardiology is asked to consult regarding management.  [name] was recently hospitalized and treated for heart failure. He started on diuretic and we discontinued his nifedipine and switched him to carvedilol. He recently followed up with his primary care provider who noted he was in new onset atrial fibrillation. It's feasible that atrial fibrillation was the precipitating factor for his heart failure. He seems to be unaware of his atrial fibrillation. A repeat EKG in our office today shows that he is converted back to sinus rhythm but he does have deep anterolateral and inferior T-wave inversions. These could be due to repolarization secondary to LVH however given his history of known coronary artery disease, could represent ischemia.  PMHx:  Past Medical History  Diagnosis Date  . Hypertension   . Hyperlipidemia   . Gout   . CAD (coronary artery disease)     cath 2010  . AAA (abdominal aortic aneurysm)     stent graft put in 02/2009  . Arthritis     Past Surgical History  Procedure Laterality Date  . Abdominal aortic aneurysm repair  02/19/2009    performed by [facility]  . Colonoscopy    . Knee arthroscopy  2003    left  . Shoulder arthroscopy with rotator cuff repair and subacromial decompression Left 11/19/2013    Procedure: LEFT SHOULDER ARTHROSCOPY DEBRIDEMENT EXTENTSIVE DISTAL CLAVICULECTOMY  DECOMPRESSION PARTIAL ACROMIOPLASTY WITH CORACOACROMIAL WITH ROTATOR CUFF REPAIR ;  Surgeon: Renette Butters, MD;  Location: [facility];  Service: Orthopedics;  Laterality: Left;    FAMHx:  Family History  Problem Relation Age of Onset  . Diabetes Sister   . Hypertension Sister   . Cancer Father     prostate  . Hypertension Mother     SOCHx:   reports that he has been smoking Cigarettes.  He has a 12.5 pack-year smoking history. He has never used smokeless tobacco. He reports that he drinks about 4.2 - 4.8 oz of alcohol per week. He reports that he does not use illicit drugs.  ALLERGIES:  Allergies  Allergen Reactions  . Iohexol      Code: RASH, Desc: PAITENT STATED THAT HE BEGAN ITCHING TOWARDS END OF INJECTIN OF IV CONTRAST-- 13 HR PREP RECOMMENDED/MMS     ROS: A comprehensive review of systems was negative except for: Respiratory: positive for dyspnea on exertion  HOME MEDS: Current Outpatient Prescriptions  Medication Sig Dispense Refill  . aspirin 81 MG tablet Take 81 mg by mouth daily.    . carvedilol (COREG) 6.25 MG tablet Take 1 tablet by mouth 2 (two) times daily.    . CRESTOR 40 MG tablet Take 40 mg by mouth daily.  4  . fish oil-omega-3 fatty acids 1000 MG capsule Take 2 g by mouth daily.    . furosemide (LASIX) 20 MG tablet Take 1 tablet (20 mg total) by mouth daily. 30 tablet 0  . isosorbide mononitrate (IMDUR) 30 MG 24 hr tablet Take 1 tablet (30 mg total) by mouth daily. 30 tablet 0  . lisinopril (PRINIVIL,ZESTRIL) 20 MG tablet Take 1 tablet (20 mg total) by mouth daily. 30 tablet 0  . Multiple Vitamin (MULTIVITAMIN) capsule Take 1 capsule by mouth daily.    . potassium chloride (K-DUR) 10 MEQ tablet Take 1 tablet (10 mEq total) by mouth daily. 30 tablet 0  . apixaban (ELIQUIS) 5 MG TABS tablet Take 1 tablet (5 mg total) by mouth 2 (two) times daily. 60 tablet 6   No current facility-administered medications for this visit.     LABS/IMAGING: No results found for this or any previous visit (from the past 48 hour(s)). No results found.  VITALS: BP 160/92 mmHg  Pulse 63  Ht 6\' 2"  (1.88 m)  Wt 210 lb (95.255 kg)  BMI 26.95 kg/m2  EXAM: General appearance: alert and no distress Neck: no carotid bruit and no JVD Lungs: clear to auscultation bilaterally Heart: regular rate and rhythm, S1, S2 normal, no murmur, click, rub or gallop Abdomen: soft, non-tender; bowel sounds normal; no masses,  no organomegaly Extremities: extremities normal, atraumatic, no cyanosis or edema Pulses: 2+ and symmetric Skin: Skin color, texture, turgor normal. No rashes or lesions Neurologic: Grossly normal Psych: NOrma  EKG: Normal sinus rhythm at 89  ASSESSMENT: 1. Mild to moderate artery disease by cath in 2012 2. 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 Recent accelerated hypertension 4. History of abdominal aortic aneurysm repair 5. Recent elevated troponin, secondary to demand ischemia 6. Paroxysmal atrial fibrillation - CHADSVASC 4  PLAN: 1.   Mr. Longsworth has a history of coronary artery disease and recently had a small troponin elevation due to heart failure, thought  secondary to demand ischemia. He was also hypertensive at the time. Blood pressure is now improved however as an outpatient and was found to have new onset atrial fibrillation by his primary care provider. This could be ischemic in nature, and his EKG now shows spontaneous conversion back to sinus rhythm with marked anterolateral and inferior T-wave inversions. I suspect these are due to LVH however cannot rule out new ischemia, especially in the setting of an elevated troponin. I would recommend an exercise nuclear stress test. We discussed the risk of stroke which is elevated. His CHADSVASC score is 4.  Based on this his risk of stroke is about 5-6% per year. I'm recommending he start on Eliquis 5 mg twice daily and continue  on aspirin due to his history of coronary disease. This is expected to reduce his risk of stroke down to about 1.7%. We'll also schedule him with a short appointment to see Erasmo Downer our anticoagulation pharmacist to further discuss the use of Eliquis and the safety profile. He could have his blood pressure rechecked again at that time.  Plan to see him back to discuss results of his stress test in a few weeks.  [name], MD, [facility] Attending Cardiologist [facility]  Voyd Groft C 01/29/2015, 9:48 AM

## 2015-01-29 NOTE — Patient Instructions (Addendum)
Your physician has requested that you have an exercise stress myoview. For further information please visit [email or web address]. Please follow instruction sheet, as given.  Your physician recommends that you schedule a follow-up appointment after your stress test.   Your physician has recommended you make the following change in your medication: START Eliquis 5mg  twice daily (blood thinner) Drug name: Eliquis 5mg  Qty: 70  LOT: [id number]  Exp.Date: 09/2017    Please schedule an appointment with Krisitn (pharmacist) to talk about Eliquis & atrial fibrillation & to check BP (anticoagulation)

## 2015-01-30 ENCOUNTER — Telehealth: Payer: Self-pay

## 2015-01-30 NOTE — Telephone Encounter (Signed)
Prior authorization for Eliquis 5 mg faxed to patient's insurance company via [email or web address] Awaiting approval. 

## 2015-02-05 ENCOUNTER — Telehealth (HOSPITAL_COMMUNITY): Payer: Self-pay

## 2015-02-05 NOTE — Telephone Encounter (Signed)
Called patient's pharmacy. Eliquis approved for $45 per pharmacy staff

## 2015-02-05 NOTE — Telephone Encounter (Signed)
Encounter complete. 

## 2015-02-06 ENCOUNTER — Telehealth (HOSPITAL_COMMUNITY): Payer: Self-pay

## 2015-02-06 NOTE — Telephone Encounter (Signed)
Encounter complete. 

## 2015-02-10 ENCOUNTER — Ambulatory Visit (HOSPITAL_COMMUNITY)
Admission: RE | Admit: 2015-02-10 | Discharge: 2015-02-10 | Disposition: A | Discharge status: Home / Self Care | Payer: Medicare Other | Source: Ambulatory Visit | Attending: Cardiology | Admitting: Cardiology

## 2015-02-10 DIAGNOSIS — I4891 Unspecified atrial fibrillation: Secondary | ICD-10-CM | POA: Diagnosis present

## 2015-02-10 MED ORDER — TECHNETIUM TC 99M SESTAMIBI GENERIC - CARDIOLITE
10.5000 | Freq: Once | INTRAVENOUS | Status: AC | PRN
  Administered 2015-02-10: 11 via INTRAVENOUS

## 2015-02-10 MED ORDER — TECHNETIUM TC 99M SESTAMIBI GENERIC - CARDIOLITE
30.2000 | Freq: Once | INTRAVENOUS | Status: AC | PRN
  Administered 2015-02-10: 30.2 via INTRAVENOUS

## 2015-02-10 NOTE — Procedures (Addendum)
[facility] [address] [place] [address] [phone]  Cardiology Nuclear Med Study  Rodney [name] is a 74 y.o. male     MRN : [id number]     DOB: 1941-11-18  Procedure Date: 02/10/2015  Nuclear Med Background Indication for Stress Test:  Evaluation for Ischemia, [facility], Abnormal EKG and Follow up CAD History:  CAD;Last NUC MPI on 02/03/2009-normal;EF=44%;AFIB;Elevated troponins-01/03/2015;CHF 12/2014;PVD-AAA w/stent/graft Cardiac Risk Factors: Hypertension, Overweight and Smoker  Symptoms:  DOE, Fatigue, Light-Headedness and Palpitations   Nuclear Pre-Procedure Caffeine/Decaff Intake:  7:00pm NPO After: 3:00am   IV Site: R Forearm  IV 0.9% NS with Angio Cath:  22g  Chest Size (in):  48" IV Started by: Rolene Course, RN  Height: 6\' 2"  (1.88 m)  Cup Size: n/a  BMI:  Body mass index is 26.95 kg/(m^2). Weight:  210 lb (95.255 kg)   Tech Comments:  n/a    Nuclear Med Study 1 or 2 day study: 1 day  Stress Test Type:  Stress  Order Authorizing Provider:  Lyman Bishop, MD   Resting Radionuclide: Technetium 8m Sestamibi  Resting Radionuclide Dose: 10.5 mCi   Stress Radionuclide:  Technetium 16m Sestamibi  Stress Radionuclide Dose: 30.2 mCi           Stress Protocol Rest HR: 55 Stress HR: 129  Rest BP: 187/100 Stress BP: 193/89  Exercise Time (min): 6:30 METS: 7.   Predicted Max HR: 148 bpm % Max HR: 87.16 bpm Rate Pressure Product: 26574  Dose of Adenosine (mg):  n/a Dose of Lexiscan: n/a mg  Dose of Atropine (mg): n/a Dose of Dobutamine: n/a mcg/kg/min (at max HR)  Stress Test Technologist: Leane Para, CCT Nuclear Technologist: Imagene Riches, CNMT   Rest Procedure:  Myocardial perfusion imaging was performed at rest 45 minutes following the intravenous administration of Technetium 9m Sestamibi. Stress Procedure:  The patient performed treadmill exercise using a Bruce  Protocol for 6:30 minutes.  The patient stopped due to Marked SOB and denied any chest pain.  There were significant ST-T wave changes.  Technetium 7m Sestamibi was injected at peak exercise and myocardial perfusion imaging was performed after a brief delay.  Transient Ischemic Dilatation (Normal <1.22):  1.10  QGS EDV:  194 ml QGS ESV:  139 ml LV Ejection Fraction: 28%      Rest ECG: NSR-LVH with prominent repolarization abnormalities  Stress ECG: there is additional 2-3 mm ST depression from baseline  QPS Raw Data Images:  Cardiomegaly is seen. Stress Images:  There is decreased uptake in the inferior wall. The defect is moderate in extent and moderate in severity Rest Images:  Comparison with the stress images reveals no significant change. Subtraction (SDS):  There is a fixed defect that is most consistent with a previous infarction. LV Wall Motion:  severely decreased overall LV systolic function, EF 25%, global hypokinesis and inferior hypokinesis  Impression Exercise Capacity:  Fair exercise capacity. BP Response:  Hypertensive blood pressure response. Clinical Symptoms:  The exercise was limited by dyspnea. ECG Impression:  Baseline:  LVH.  EKG uninterpretable due to LVH related changes at rest and stress. Comparison with Prior Nuclear Study: Compared to 2010, there is no change in the perfusion pattern, but there is substantial further reduction in LVEF (was 44%).   Overall Impression:  High risk stress nuclear study due to severely decreased LV function. There is a moderate inferior wall scar. No reversible ischemia is seen..  Consider multivessel  CAD versus adverse post-infarction remodeling as cause of worsening cardiomyopathy.  [name], MD  02/10/2015 1:26 PM

## 2015-02-11 ENCOUNTER — Ambulatory Visit (INDEPENDENT_AMBULATORY_CARE_PROVIDER_SITE_OTHER): Payer: Medicare Other | Admitting: Pharmacist Clinician (PhC)/ Clinical Pharmacy Specialist

## 2015-02-11 VITALS — BP 128/80 | Ht 74.0 in | Wt 214.8 lb

## 2015-02-11 DIAGNOSIS — I10 Essential (primary) hypertension: Secondary | ICD-10-CM

## 2015-02-11 DIAGNOSIS — I4891 Unspecified atrial fibrillation: Secondary | ICD-10-CM

## 2015-02-11 MED ORDER — VALSARTAN 160 MG PO TABS: 160.0000 mg | ORAL_TABLET | Freq: Every day | ORAL | Status: DC

## 2015-02-11 NOTE — Patient Instructions (Signed)
Continue with the Eliquis 5 mg twice daily  Your blood pressure today is 128/80  Check your blood pressure at home several days each week and keep record of the readings.  Take your BP meds as follows - stop lisinopril.  Start valsartan 160 mg once daily.  Cough should go away in 2-4 weeks  Exercise as you're able, try to walk approximately 30 minutes per day.  Keep salt intake to a minimum, especially watch canned and prepared boxed foods.  Eat more fresh fruits and vegetables and fewer canned items.  Avoid eating in fast food restaurants.    HOW TO TAKE YOUR BLOOD PRESSURE: . Rest 5 minutes before taking your blood pressure. .  Don't smoke or drink caffeinated beverages for at least 30 minutes before. . Take your blood pressure before (not after) you eat. . Sit comfortably with your back supported and both feet on the floor (don't cross your legs). . Elevate your arm to heart level on a table or a desk. . Use the proper sized cuff. It should fit smoothly and snugly around your bare upper arm. There should be enough room to slip a fingertip under the cuff. The bottom edge of the cuff should be 1 inch above the crease of the elbow. . Ideally, take 3 measurements at one sitting and record the average.

## 2015-02-12 ENCOUNTER — Encounter: Payer: Self-pay | Admitting: Pharmacist Clinician (PhC)/ Clinical Pharmacy Specialist

## 2015-02-12 NOTE — Progress Notes (Signed)
Pt was started on Eliquis for atrial fibrillation on January 29, 2015.    Reviewed patients medication list.  Pt is not currently on any combined P-gp and strong CYP3A4 inhibitors/inducers (ketoconazole, traconazole, ritonavir, carbamazepine, phenytoin, rifampin, St. John's wort).  Reviewed labs.  SCr 1.27, Weight 214.8 lb, CrCl- 72.  Dose appropriate based on CrCl.   Hgb and HCT Within Normal Limits  A full discussion of the nature of anticoagulants has been carried out.  A benefit/risk analysis has been presented to the patient, so that they understand the justification for choosing anticoagulation with Eliquis at this time.  The need for compliance is stressed.  Pt is aware to take the medication twice daily.  Side effects of potential bleeding are discussed, including unusual colored urine or stools, coughing up blood or coffee ground emesis, nose bleeds or serious fall or head trauma.  Discussed signs and symptoms of stroke. The patient should avoid any OTC items containing aspirin or ibuprofen.  Avoid alcohol consumption.   Call if any signs of abnormal bleeding.  Discussed financial obligations and resolved any difficulty in obtaining medication.  Next lab test test in 6 months.   Pt also needing check of BP due to increase of lisinopril from 10 to 20 mg daily.  States has noticed his cough worsening in past weeks, since dose increased.   BP today stable at 128/80.  Will switch patient from lisinopril to valsartan 160 mg, explained differences in medications.  Also told patient that cough can take a couple of weeks to go away.  Has taken lisinopril already today, will stop and start the valsartan tomorrow.  Has a home BP cuff and asked that he monitor daily for the next week, until his appointment with Dr. Debara Pickett on Tuesday.  Pt voiced understanding

## 2015-02-17 ENCOUNTER — Ambulatory Visit (INDEPENDENT_AMBULATORY_CARE_PROVIDER_SITE_OTHER): Payer: Medicare Other | Admitting: Internal Medicine

## 2015-02-17 ENCOUNTER — Other Ambulatory Visit: Payer: Self-pay | Admitting: *Deleted

## 2015-02-17 ENCOUNTER — Encounter: Payer: Self-pay | Admitting: Internal Medicine

## 2015-02-17 VITALS — BP 120/80 | HR 60 | Ht 74.0 in | Wt 215.4 lb

## 2015-02-17 DIAGNOSIS — I2510 Atherosclerotic heart disease of native coronary artery without angina pectoris: Secondary | ICD-10-CM

## 2015-02-17 DIAGNOSIS — I480 Paroxysmal atrial fibrillation: Secondary | ICD-10-CM

## 2015-02-17 DIAGNOSIS — I2583 Coronary atherosclerosis due to lipid rich plaque: Secondary | ICD-10-CM

## 2015-02-17 DIAGNOSIS — I255 Ischemic cardiomyopathy: Secondary | ICD-10-CM

## 2015-02-17 DIAGNOSIS — D689 Coagulation defect, unspecified: Secondary | ICD-10-CM

## 2015-02-17 DIAGNOSIS — IMO0002 Reserved for concepts with insufficient information to code with codable children: Secondary | ICD-10-CM

## 2015-02-17 DIAGNOSIS — Z01818 Encounter for other preprocedural examination: Secondary | ICD-10-CM

## 2015-02-17 DIAGNOSIS — R7989 Other specified abnormal findings of blood chemistry: Secondary | ICD-10-CM

## 2015-02-17 DIAGNOSIS — R778 Other specified abnormalities of plasma proteins: Secondary | ICD-10-CM

## 2015-02-17 DIAGNOSIS — I5031 Acute diastolic (congestive) heart failure: Secondary | ICD-10-CM

## 2015-02-17 DIAGNOSIS — R5383 Other fatigue: Secondary | ICD-10-CM

## 2015-02-17 DIAGNOSIS — Z79899 Other long term (current) drug therapy: Secondary | ICD-10-CM

## 2015-02-17 NOTE — Progress Notes (Signed)
OFFICE NOTE  Chief Complaint:  Follow-up stress test  Primary Care Physician: [name], MD  HPI:  Rodney [name] is a 74 y.o. male with a past medical history significant for CAD, hypertension, dyslipidemia, and AAA s/p stent-grafting in 2012 by Dr. Kellie Simmering. Preoperatively, he underwent a stress test which was abnormal, showing an inferior defect consistent with possible scar and mild to moderate LV dysfunction. He had a LHC, which revealed the following:  ANGIOGRAPHY: Left main: The left main is fairly normal.  The left anterior descending artery has some proximal luminal irregularities between 20 and 30%. The mid vessel stenosis around 30- 40%. It gives off several small diagonal arteries. The first diagonal artery is moderate in size and has mild irregularities. The second diagonal artery is very tiny and has an 80% proximal stenosis. This vessel is quite small and is not a candidate for PTCA.  The remaining LAD has only minor luminal irregularities.  The left circumflex artery is a large vessel. It gives off a very high obtuse marginal artery. The remaining circumflex artery continues around the A-V groove and supplies a posterolateral branch.  The first obtuse marginal artery is subtotally occluded at 2 different sites. There was sluggish TIMI grade 1 flow through this vessel. This vessel reaches around the lateral wall and supplies a small amount of the inferior left ventricle. This was quite likely where the site of the abnormal Cardiolite.  The right coronary artery is large and dominant. There are minor luminal irregularities. The posterior descending artery and the posterolateral segment artery, they taper fairly quickly at the takeoff. This taper improved slightly with intracoronary nitroglycerin. There are only minor luminal irregularities between 20 and 30%.  The left ventriculogram was performed in a  30-RAO position. It reveals a moderately dilated and hypocontractile left ventricle. The ejection fraction is probably 40%.  During this left ventriculogram, we noticed that he had a very dilated aortic root. We performed an aortic root angiography, which revealed a dilated aortic root. There was no evidence of aortic insufficiency.  He now presents with accelerated hypertension and dyspnea, but no chest pain -troponin is borderline elevated at 0.05 and 0.07, BNP is elevated at 331. CXR demonstrates pulmonary edema. EKG shows sinus rhythm with LVH and lateral repolarization changes. Cardiology is asked to consult regarding management.  Rodney [name] was recently hospitalized and treated for heart failure. He started on diuretic and we discontinued his nifedipine and switched him to carvedilol. He recently followed up with his primary care provider who noted he was in new onset atrial fibrillation. It's feasible that atrial fibrillation was the precipitating factor for his heart failure. He seems to be unaware of his atrial fibrillation. A repeat EKG in our office today shows that he is converted back to sinus rhythm but he does have deep anterolateral and inferior T-wave inversions. These could be due to repolarization secondary to LVH however given his history of known coronary artery disease, could represent ischemia.  I saw Rodney [name] back in the office today. He underwent a nuclear stress test which was interpreted as follows:  Overall Impression: High risk stress nuclear study due to  severely decreased LV function (EF 28% - down from 445). There is a moderate inferior wall scar. No reversible ischemia is seen.. Consider multivessel CAD  versus adverse post-infarction remodeling as cause of worsening  Cardiomyopathy.  Based on these findings, I discussed further workup including right and left heart catheterization. We discussed the  risk and benefits of that procedure the office today  and he did provide informed consent for it. We'll work on trying to schedule within the next week or 2.  PMHx:  Past Medical History  Diagnosis Date  . Hypertension   . Hyperlipidemia   . Gout   . CAD (coronary artery disease)     cath 2010  . AAA (abdominal aortic aneurysm)     stent graft put in 02/2009  . Arthritis     Past Surgical History  Procedure Laterality Date  . Abdominal aortic aneurysm repair  [name]    performed by Rodney [name]  . Colonoscopy    . Knee arthroscopy  2003    left  . Shoulder arthroscopy with rotator cuff repair and subacromial decompression Left 11/19/2013    Procedure: LEFT SHOULDER ARTHROSCOPY DEBRIDEMENT EXTENTSIVE DISTAL CLAVICULECTOMY DECOMPRESSION PARTIAL ACROMIOPLASTY WITH CORACOACROMIAL WITH ROTATOR CUFF REPAIR ;  Surgeon: Renette Butters, MD;  Location: [facility];  Service: Orthopedics;  Laterality: Left;    FAMHx:  Family History  Problem Relation Age of Onset  . Diabetes Sister   . Hypertension Sister   . Cancer Father     prostate  . Hypertension Mother     SOCHx:   reports that he has been smoking Cigarettes.  He has a 12.5 pack-year smoking history. He has never used smokeless tobacco. He reports that he drinks about 4.2 - 4.8 oz of alcohol per week. He reports that he does not use illicit drugs.  ALLERGIES:  Allergies  Allergen Reactions  . Iohexol      Code: RASH, Desc: PAITENT STATED THAT HE BEGAN ITCHING TOWARDS END OF INJECTIN OF IV CONTRAST-- 13 HR PREP RECOMMENDED/MMS   . Lisinopril Cough    ROS: A comprehensive review of systems was negative except for: Respiratory: positive for dyspnea on exertion  HOME MEDS: Current Outpatient Prescriptions  Medication Sig Dispense Refill  . apixaban (ELIQUIS) 5 MG TABS tablet Take 1 tablet (5 mg total) by mouth 2 (two) times daily. 60 tablet 6  . aspirin 81 MG tablet Take 81 mg by mouth daily.    . carvedilol (COREG) 6.25 MG tablet Take 1 tablet by mouth 2 (two)  times daily.    . CRESTOR 40 MG tablet Take 40 mg by mouth daily.  4  . fish oil-omega-3 fatty acids 1000 MG capsule Take 2 g by mouth daily.    . furosemide (LASIX) 20 MG tablet Take 1 tablet (20 mg total) by mouth daily. 30 tablet 0  . isosorbide mononitrate (IMDUR) 30 MG 24 hr tablet Take 1 tablet (30 mg total) by mouth daily. 30 tablet 0  . Multiple Vitamin (MULTIVITAMIN) capsule Take 1 capsule by mouth daily.    . potassium chloride (K-DUR) 10 MEQ tablet Take 1 tablet (10 mEq total) by mouth daily. 30 tablet 0  . valsartan (DIOVAN) 160 MG tablet Take 1 tablet (160 mg total) by mouth daily. 30 tablet 5   No current facility-administered medications for this visit.    LABS/IMAGING: No results found for this or any previous visit (from the past 48 hour(s)). No results found.  VITALS: BP 120/80 mmHg  Pulse 60  Ht 6\' 2"  (1.88 m)  Wt 215 lb 6.4 oz (97.705 kg)  BMI 27.64 kg/m2  EXAM: Deferred  EKG: Deferred  ASSESSMENT: 1. Mild to moderate artery disease by cath in 2012 - high risk nuclear stress test with a low EF of 28% and no significant  reversible ischemia 2. 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 Recent accelerated hypertension 4. History of abdominal aortic aneurysm repair 5. Recent elevated troponin, secondary to demand ischemia 6. Paroxysmal atrial fibrillation - CHADSVASC 4  PLAN: 1.   Rodney [name] has a history of coronary artery disease and recently had a small troponin elevation due to heart failure, thought secondary to demand ischemia. As previously mentioned he had at least moderate coronary artery disease several years ago prior to his abdominal aortic aneurysm repair. He recently had heart failure and new onset atrial fibrillation. His stress test is now reading as severe LV dysfunction with a fixed inferior defect and no significant reversible ischemia. This represents a significant drop in LV function compared to his prior  study. Given these findings I'm concerned about progression of his coronary disease and possible multivessel coronary artery disease.  I'm recommending left and right heart catheterization. Again I discussed the risk and benefits of this procedure with him today and he is willing to proceed.  We'll try to schedule that within the next few weeks. He will have to hold his Eliquis for at least 3 days prior to the procedure.  Thanks again for allowing me to participate in his care.  [name], MD, [facility] Attending Cardiologist [facility]  Delorice Bannister C 02/17/2015, 6:36 PM

## 2015-02-17 NOTE — Patient Instructions (Addendum)
Your physician has requested that you have a RIGHT & LEFT cardiac catheterization. Cardiac catheterization is used to diagnose and/or treat various heart conditions. Doctors may recommend this procedure for a number of different reasons. The most common reason is to evaluate chest pain. Chest pain can be a symptom of coronary artery disease (CAD), and cardiac catheterization can show whether plaque is narrowing or blocking your heart's arteries. This procedure is also used to evaluate the valves, as well as measure the blood flow and oxygen levels in different parts of your heart. For further information please visit [email or web address]. Please follow instruction sheet, as given.  You will need to have blood work & a chest x-ray 3-5 days prior to this procedure.  Please go to [phone] [place] do not need an appointment  YOU WILL NEED TO HOLD ELIQUIS 3 DAYS PRIOR TO YOUR PROCEDURE.

## 2015-02-19 ENCOUNTER — Ambulatory Visit
Admission: RE | Admit: 2015-02-19 | Discharge: 2015-02-19 | Disposition: A | Discharge status: Home / Self Care | Payer: Medicare Other | Source: Ambulatory Visit | Attending: Internal Medicine | Admitting: Internal Medicine

## 2015-02-19 DIAGNOSIS — Z01818 Encounter for other preprocedural examination: Secondary | ICD-10-CM

## 2015-02-19 LAB — PROTIME-INR
INR: 1.16 (ref <1.50)
PROTHROMBIN TIME: 14.8 s (ref 11.6–15.2)

## 2015-02-19 LAB — CBC
HCT: 43.3 % (ref 39.0–52.0)
HEMOGLOBIN: 15.2 g/dL (ref 13.0–17.0)
MCH: 32.2 pg (ref 26.0–34.0)
MCHC: 35.1 g/dL (ref 30.0–36.0)
MCV: 91.7 fL (ref 78.0–100.0)
MPV: 9.9 fL (ref 8.6–12.4)
PLATELETS: 129 10*3/uL — AB (ref 150–400)
RBC: 4.72 MIL/uL (ref 4.22–5.81)
RDW: 13.7 % (ref 11.5–15.5)
WBC: 3.6 10*3/uL — AB (ref 4.0–10.5)

## 2015-02-19 LAB — BASIC METABOLIC PANEL
BUN: 14 mg/dL (ref 6–23)
CO2: 24 meq/L (ref 19–32)
Calcium: 9 mg/dL (ref 8.4–10.5)
Chloride: 108 mEq/L (ref 96–112)
Creat: 1.33 mg/dL (ref 0.50–1.35)
Glucose, Bld: 97 mg/dL (ref 70–99)
Potassium: 4.1 mEq/L (ref 3.5–5.3)
Sodium: 139 mEq/L (ref 135–145)

## 2015-02-19 LAB — TSH: TSH: 0.63 u[IU]/mL (ref 0.350–4.500)

## 2015-02-19 LAB — APTT: APTT: 29 s (ref 24–37)

## 2015-02-20 ENCOUNTER — Emergency Department (HOSPITAL_COMMUNITY): Payer: Medicare Other

## 2015-02-20 ENCOUNTER — Observation Stay (HOSPITAL_COMMUNITY)
Admission: EM | Admit: 2015-02-20 | Discharge: 2015-02-21 | Disposition: A | Discharge status: Home / Self Care | Payer: Medicare Other | Attending: Internal Medicine | Admitting: Internal Medicine

## 2015-02-20 ENCOUNTER — Encounter (HOSPITAL_COMMUNITY): Payer: Self-pay | Admitting: Emergency Medicine

## 2015-02-20 ENCOUNTER — Observation Stay (HOSPITAL_COMMUNITY): Payer: Medicare Other

## 2015-02-20 DIAGNOSIS — I2510 Atherosclerotic heart disease of native coronary artery without angina pectoris: Secondary | ICD-10-CM | POA: Diagnosis present

## 2015-02-20 DIAGNOSIS — D696 Thrombocytopenia, unspecified: Secondary | ICD-10-CM | POA: Diagnosis present

## 2015-02-20 DIAGNOSIS — Z888 Allergy status to other drugs, medicaments and biological substances status: Secondary | ICD-10-CM | POA: Insufficient documentation

## 2015-02-20 DIAGNOSIS — E785 Hyperlipidemia, unspecified: Secondary | ICD-10-CM | POA: Insufficient documentation

## 2015-02-20 DIAGNOSIS — F17210 Nicotine dependence, cigarettes, uncomplicated: Secondary | ICD-10-CM | POA: Diagnosis not present

## 2015-02-20 DIAGNOSIS — Z720 Tobacco use: Secondary | ICD-10-CM | POA: Insufficient documentation

## 2015-02-20 DIAGNOSIS — G459 Transient cerebral ischemic attack, unspecified: Principal | ICD-10-CM | POA: Diagnosis present

## 2015-02-20 DIAGNOSIS — Z79899 Other long term (current) drug therapy: Secondary | ICD-10-CM | POA: Insufficient documentation

## 2015-02-20 DIAGNOSIS — I480 Paroxysmal atrial fibrillation: Secondary | ICD-10-CM | POA: Diagnosis not present

## 2015-02-20 DIAGNOSIS — I25110 Atherosclerotic heart disease of native coronary artery with unstable angina pectoris: Secondary | ICD-10-CM

## 2015-02-20 DIAGNOSIS — D72819 Decreased white blood cell count, unspecified: Secondary | ICD-10-CM

## 2015-02-20 DIAGNOSIS — G451 Carotid artery syndrome (hemispheric): Secondary | ICD-10-CM

## 2015-02-20 DIAGNOSIS — Z7982 Long term (current) use of aspirin: Secondary | ICD-10-CM | POA: Insufficient documentation

## 2015-02-20 DIAGNOSIS — M109 Gout, unspecified: Secondary | ICD-10-CM | POA: Insufficient documentation

## 2015-02-20 DIAGNOSIS — I10 Essential (primary) hypertension: Secondary | ICD-10-CM | POA: Diagnosis present

## 2015-02-20 DIAGNOSIS — I5042 Chronic combined systolic (congestive) and diastolic (congestive) heart failure: Secondary | ICD-10-CM

## 2015-02-20 DIAGNOSIS — I129 Hypertensive chronic kidney disease with stage 1 through stage 4 chronic kidney disease, or unspecified chronic kidney disease: Secondary | ICD-10-CM | POA: Insufficient documentation

## 2015-02-20 DIAGNOSIS — N183 Chronic kidney disease, stage 3 (moderate): Secondary | ICD-10-CM | POA: Insufficient documentation

## 2015-02-20 HISTORY — DX: Tobacco use: Z72.0

## 2015-02-20 HISTORY — DX: Paroxysmal atrial fibrillation: I48.0

## 2015-02-20 LAB — CBC
HCT: 45 % (ref 39.0–52.0)
HEMOGLOBIN: 15.3 g/dL (ref 13.0–17.0)
MCH: 31.5 pg (ref 26.0–34.0)
MCHC: 34 g/dL (ref 30.0–36.0)
MCV: 92.6 fL (ref 78.0–100.0)
PLATELETS: 132 10*3/uL — AB (ref 150–400)
RBC: 4.86 MIL/uL (ref 4.22–5.81)
RDW: 13.3 % (ref 11.5–15.5)
WBC: 3.2 10*3/uL — AB (ref 4.0–10.5)

## 2015-02-20 LAB — COMPREHENSIVE METABOLIC PANEL
ALK PHOS: 63 U/L (ref 39–117)
ALT: 46 U/L (ref 0–53)
AST: 41 U/L — ABNORMAL HIGH (ref 0–37)
Albumin: 4 g/dL (ref 3.5–5.2)
Anion gap: 7 (ref 5–15)
BUN: 18 mg/dL (ref 6–23)
CO2: 21 mmol/L (ref 19–32)
Calcium: 8.7 mg/dL (ref 8.4–10.5)
Chloride: 111 mmol/L (ref 96–112)
Creatinine, Ser: 1.31 mg/dL (ref 0.50–1.35)
GFR, EST AFRICAN AMERICAN: 61 mL/min — AB (ref >90)
GFR, EST NON AFRICAN AMERICAN: 53 mL/min — AB (ref >90)
GLUCOSE: 118 mg/dL — AB (ref 70–99)
POTASSIUM: 3.8 mmol/L (ref 3.5–5.1)
Sodium: 139 mmol/L (ref 135–145)
TOTAL PROTEIN: 7.1 g/dL (ref 6.0–8.3)
Total Bilirubin: 0.6 mg/dL (ref 0.3–1.2)

## 2015-02-20 LAB — I-STAT TROPONIN, ED: Troponin i, poc: 0.01 ng/mL (ref 0.00–0.08)

## 2015-02-20 LAB — DIFFERENTIAL
BASOS ABS: 0 10*3/uL (ref 0.0–0.1)
Basophils Relative: 1 % (ref 0–1)
EOS PCT: 2 % (ref 0–5)
Eosinophils Absolute: 0.1 10*3/uL (ref 0.0–0.7)
LYMPHS PCT: 51 % — AB (ref 12–46)
Lymphs Abs: 1.6 10*3/uL (ref 0.7–4.0)
Monocytes Absolute: 0.2 10*3/uL (ref 0.1–1.0)
Monocytes Relative: 8 % (ref 3–12)
NEUTROS ABS: 1.2 10*3/uL — AB (ref 1.7–7.7)
Neutrophils Relative %: 38 % — ABNORMAL LOW (ref 43–77)

## 2015-02-20 LAB — URINALYSIS, ROUTINE W REFLEX MICROSCOPIC
BILIRUBIN URINE: NEGATIVE
Glucose, UA: NEGATIVE mg/dL
Ketones, ur: NEGATIVE mg/dL
Leukocytes, UA: NEGATIVE
NITRITE: NEGATIVE
Protein, ur: 30 mg/dL — AB
SPECIFIC GRAVITY, URINE: 1.013 (ref 1.005–1.030)
Urobilinogen, UA: 1 mg/dL (ref 0.0–1.0)
pH: 5.5 (ref 5.0–8.0)

## 2015-02-20 LAB — URINE MICROSCOPIC-ADD ON

## 2015-02-20 LAB — CBG MONITORING, ED: Glucose-Capillary: 111 mg/dL — ABNORMAL HIGH (ref 70–99)

## 2015-02-20 LAB — PROTIME-INR
INR: 1.16 (ref 0.00–1.49)
PROTHROMBIN TIME: 14.9 s (ref 11.6–15.2)

## 2015-02-20 LAB — TROPONIN I: Troponin I: <0.03 ng/mL (ref <0.031)

## 2015-02-20 LAB — APTT: APTT: 29 s (ref 24–37)

## 2015-02-20 MED ORDER — MULTIVITAMINS PO CAPS: 1.0000 | ORAL_CAPSULE | Freq: Every day | ORAL | Status: DC

## 2015-02-20 MED ORDER — NICOTINE 14 MG/24HR TD PT24
14.0000 mg | MEDICATED_PATCH | Freq: Every day | TRANSDERMAL | Status: DC
  Administered 2015-02-21: 14 mg via TRANSDERMAL
  Filled 2015-02-20: qty 1

## 2015-02-20 MED ORDER — NICOTINE 14 MG/24HR TD PT24: 14.0000 mg | MEDICATED_PATCH | Freq: Every day | TRANSDERMAL | Status: DC

## 2015-02-20 MED ORDER — IRBESARTAN 150 MG PO TABS
150.0000 mg | ORAL_TABLET | Freq: Every day | ORAL | Status: DC
  Administered 2015-02-20 – 2015-02-21 (×2): 150 mg via ORAL
  Filled 2015-02-20 (×2): qty 1

## 2015-02-20 MED ORDER — OMEGA-3 FATTY ACIDS 1000 MG PO CAPS: 2.0000 g | ORAL_CAPSULE | Freq: Every day | ORAL | Status: DC

## 2015-02-20 MED ORDER — POTASSIUM CHLORIDE ER 10 MEQ PO TBCR
10.0000 meq | EXTENDED_RELEASE_TABLET | Freq: Every day | ORAL | Status: DC
  Administered 2015-02-20 – 2015-02-21 (×2): 10 meq via ORAL
  Filled 2015-02-20 (×4): qty 1

## 2015-02-20 MED ORDER — APIXABAN 5 MG PO TABS
5.0000 mg | ORAL_TABLET | Freq: Two times a day (BID) | ORAL | Status: DC
  Administered 2015-02-20 – 2015-02-21 (×3): 5 mg via ORAL
  Filled 2015-02-20 (×3): qty 1

## 2015-02-20 MED ORDER — ASPIRIN EC 81 MG PO TBEC
81.0000 mg | DELAYED_RELEASE_TABLET | Freq: Every day | ORAL | Status: DC
  Administered 2015-02-21: 81 mg via ORAL
  Filled 2015-02-20 (×2): qty 1

## 2015-02-20 MED ORDER — SENNOSIDES-DOCUSATE SODIUM 8.6-50 MG PO TABS: 1.0000 | ORAL_TABLET | Freq: Every evening | ORAL | Status: DC | PRN

## 2015-02-20 MED ORDER — ADULT MULTIVITAMIN W/MINERALS CH
1.0000 | ORAL_TABLET | Freq: Every day | ORAL | Status: DC
  Administered 2015-02-21: 1 via ORAL
  Filled 2015-02-20 (×2): qty 1

## 2015-02-20 MED ORDER — ISOSORBIDE MONONITRATE ER 30 MG PO TB24
30.0000 mg | ORAL_TABLET | Freq: Every day | ORAL | Status: DC
  Administered 2015-02-20 – 2015-02-21 (×2): 30 mg via ORAL
  Filled 2015-02-20 (×2): qty 1

## 2015-02-20 MED ORDER — STROKE: EARLY STAGES OF RECOVERY BOOK
Freq: Once | Status: AC
  Administered 2015-02-20: 12:00:00

## 2015-02-20 MED ORDER — FUROSEMIDE 20 MG PO TABS
20.0000 mg | ORAL_TABLET | Freq: Every day | ORAL | Status: DC
  Administered 2015-02-20 – 2015-02-21 (×2): 20 mg via ORAL
  Filled 2015-02-20 (×2): qty 1

## 2015-02-20 MED ORDER — ROSUVASTATIN CALCIUM 40 MG PO TABS
40.0000 mg | ORAL_TABLET | Freq: Every day | ORAL | Status: DC
  Administered 2015-02-20: 40 mg via ORAL
  Filled 2015-02-20 (×2): qty 1

## 2015-02-20 MED ORDER — CARVEDILOL 6.25 MG PO TABS
6.2500 mg | ORAL_TABLET | Freq: Two times a day (BID) | ORAL | Status: DC
Start: 2015-02-20 — End: 2015-02-21
  Administered 2015-02-20 – 2015-02-21 (×3): 6.25 mg via ORAL
  Filled 2015-02-20 (×3): qty 1

## 2015-02-20 MED ORDER — OMEGA-3-ACID ETHYL ESTERS 1 G PO CAPS
2.0000 g | ORAL_CAPSULE | Freq: Every day | ORAL | Status: DC
  Administered 2015-02-20 – 2015-02-21 (×2): 2 g via ORAL
  Filled 2015-02-20 (×2): qty 2

## 2015-02-20 MED ORDER — NITROGLYCERIN 0.4 MG SL SUBL: 0.4000 mg | SUBLINGUAL_TABLET | SUBLINGUAL | Status: DC | PRN

## 2015-02-20 NOTE — Progress Notes (Signed)
Pt is admitted to room 4N25 from [facility] ED. Amission vital sign is stable

## 2015-02-20 NOTE — ED Notes (Signed)
Pt alert and oriented x4. Respirations even and unlabored, bilateral symmetrical rise and fall of chest. Skin warm and dry. In no acute distress. Denies needs.   

## 2015-02-20 NOTE — ED Notes (Signed)
Pt transported to CT ?

## 2015-02-20 NOTE — ED Notes (Signed)
md short made aware of BP, plan to given home BP meds when pt transfers to [facility].

## 2015-02-20 NOTE — H&P (Signed)
[facility] History and Physical  Rodney [name]:[id number] DOB: [birth year] DOA: 02/20/2015  Referring physician:  Pryor Curia PCP:  [name], MD   Chief Complaint:  Left sided heaviness  HPI:  The patient is a 74 y.o. year-old male with history of hypertension, hyperlipidemia, CAD s/p cath in 2010 who is scheduled for right and left heart cath next week, chronic systolic and diastolic heart failure, AAA s/p stent graft in 2010, gout who presents with left sided heaviness.  The patient has been followed closely by Cardiology secondary to CAD with abnormal stress test recently.  He was supposed to undergo right and left heart catheterization next week.  He takes eliquis and aspirin and has been compliant with his medications.  For the last few days, he has felt like his normal self.  He lives at home and is ambulatory/drives.  This morning, he awoke around 5am with some heaviness in his left arm and leg.  He denies changes to his face, slurred speech, blurry vision, confusion.  He had some tingling in his arm and leg but no weakness.  He was able to quickly get dressed and wake his wife to come to the hospital.  Wife states she did not notice any facial droop, confusion, slurred speech, or weakness.  Symptoms lasted about 30 minutes.  On the way to the hospital, he had a 30-second episode of SOB, but denies chest heaviness or tightness.      In ER, BP mildly elevated 163/94, WBC 3.2 and platelets 132, CXR and head CT unremarkable.  NIHSS 0 in ER, and unknown time of symptom onset, therefore not a candidate for tpa.  Troponin negative, ECG with a-fib (hx of PAF) and T-wave inversions in inferolateral (V5-6) leads similar to ECG from 01/29/15.    Review of Systems:  General:  Denies fevers, chills, weight loss or gain HEENT:  Denies changes to hearing and vision, rhinorrhea, sinus congestion, sore throat CV:  Denies chest pain and palpitations, lower extremity edema.  PULM:   Denies SOB, wheezing, cough.   GI:  Denies nausea, vomiting, constipation, diarrhea.   GU:  Denies dysuria, frequency, urgency ENDO:  Denies polyuria, polydipsia.   HEME:  Denies hematemesis, blood in stools, melena, abnormal bruising or bleeding.  LYMPH:  Denies lymphadenopathy.   MSK:  Denies arthralgias, myalgias.   DERM:  Denies skin rash or ulcer.   NEURO:  Per HPI PSYCH:  Denies anxiety and depression.    Past Medical History  Diagnosis Date  . Hypertension   . Hyperlipidemia   . Gout   . CAD (coronary artery disease)     cath 2010  . AAA (abdominal aortic aneurysm)     stent graft put in 02/2009  . Arthritis   . Paroxysmal atrial fibrillation   . Tobacco abuse    Past Surgical History  Procedure Laterality Date  . Abdominal aortic aneurysm repair  02/19/2009    performed by [facility]  . Colonoscopy    . Knee arthroscopy  2003    left  . Shoulder arthroscopy with rotator cuff repair and subacromial decompression Left 11/19/2013    Procedure: LEFT SHOULDER ARTHROSCOPY DEBRIDEMENT EXTENTSIVE DISTAL CLAVICULECTOMY DECOMPRESSION PARTIAL ACROMIOPLASTY WITH CORACOACROMIAL WITH ROTATOR CUFF REPAIR ;  Surgeon: Renette Butters, MD;  Location: [facility];  Service: Orthopedics;  Laterality: Left;   Social History:  reports that he has been smoking Cigarettes.  He has a 25 pack-year smoking history. He has never used  smokeless tobacco. He reports that he drinks about 4.2 - 4.8 oz of alcohol per week. He reports that he does not use illicit drugs. Lives with wife, does not use assist, drives.  No home health services.  Allergies  Allergen Reactions  . Iohexol      Code: RASH, Desc: PAITENT STATED THAT HE BEGAN ITCHING TOWARDS END OF INJECTIN OF IV CONTRAST-- 13 HR PREP RECOMMENDED/MMS   . Lisinopril Cough    Family History  Problem Relation Age of Onset  . Diabetes Sister   . Hypertension Sister   . Cancer Father     prostate  . Hypertension Mother   . Stroke Neg  Hx   . Heart attack Neg Hx      Prior to Admission medications   Medication Sig Start Date End Date Taking? Authorizing Provider  apixaban (ELIQUIS) 5 MG TABS tablet Take 1 tablet (5 mg total) by mouth 2 (two) times daily. 01/29/15  Yes [name], MD  aspirin 81 MG tablet Take 81 mg by mouth daily.   Yes Historical Provider, MD  carvedilol (COREG) 6.25 MG tablet Take 1 tablet by mouth 2 (two) times daily. 01/25/15  Yes Historical Provider, MD  CRESTOR 40 MG tablet Take 40 mg by mouth daily. 11/05/14  Yes Historical Provider, MD  fish oil-omega-3 fatty acids 1000 MG capsule Take 2 g by mouth daily.   Yes Historical Provider, MD  furosemide (LASIX) 20 MG tablet Take 1 tablet (20 mg total) by mouth daily. 01/05/15  Yes Robbie Lis, MD  isosorbide mononitrate (IMDUR) 30 MG 24 hr tablet Take 1 tablet (30 mg total) by mouth daily. 01/05/15  Yes Robbie Lis, MD  Multiple Vitamin (MULTIVITAMIN) capsule Take 1 capsule by mouth daily.   Yes Historical Provider, MD  potassium chloride (K-DUR) 10 MEQ tablet Take 1 tablet (10 mEq total) by mouth daily. 01/05/15  Yes Robbie Lis, MD  valsartan (DIOVAN) 160 MG tablet Take 1 tablet (160 mg total) by mouth daily. 02/11/15  Yes [name], MD   Physical Exam: Filed Vitals:   02/20/15 3762 02/20/15 0703 02/20/15 0725 02/20/15 0730  BP: 143/102   163/94  Pulse: 84  88 92  Temp:  98 F (36.7 C)    TempSrc:      Resp: 22  17 16   Height:      Weight:      SpO2: 95%   97%     General:  Adult male, NAD  Eyes:  PERRL, anicteric, non-injected.  ENT:  Nares clear.  OP clear, non-erythematous without plaques or exudates.  MMM.  Neck:  Supple without TM or JVD.    Lymph:  No cervical, supraclavicular, or submandibular LAD.  Cardiovascular:  IRRR, normal S1, S2, without m/r/g.  2+ pulses, warm extremities  Respiratory:  Faint rales that clear from left base, no rhonchi, wheeze, without increased WOB.  Abdomen:  NABS.  Soft, ND/NT.    Skin:   No rashes or focal lesions.  Musculoskeletal:  Normal bulk and tone.  No LE edema.  Psychiatric:  A & O x 4.  Appropriate affect.  Neurologic:  CN 3-12 intact.  5/5 strength.  Sensation intact.  2+ patellar reflexes.    Labs on Admission:  Basic Metabolic Panel:  Recent Labs Lab 02/19/15 0927 02/20/15 0618  NA 139 139  K 4.1 3.8  CL 108 111  CO2 24 21  GLUCOSE 97 118*  BUN 14 18  CREATININE  1.33 1.31  CALCIUM 9.0 8.7   Liver Function Tests:  Recent Labs Lab 02/20/15 0618  AST 41*  ALT 46  ALKPHOS 63  BILITOT 0.6  PROT 7.1  ALBUMIN 4.0   No results for input(s): LIPASE, AMYLASE in the last 168 hours. No results for input(s): AMMONIA in the last 168 hours. CBC:  Recent Labs Lab 02/19/15 0927 02/20/15 0618  WBC 3.6* 3.2*  NEUTROABS  --  1.2*  HGB 15.2 15.3  HCT 43.3 45.0  MCV 91.7 92.6  PLT 129* 132*   Cardiac Enzymes: No results for input(s): CKTOTAL, CKMB, CKMBINDEX, TROPONINI in the last 168 hours.  BNP (last 3 results)  Recent Labs  01/03/15 0714 01/03/15 1354  BNP 331.1* 539.5*    ProBNP (last 3 results) No results for input(s): PROBNP in the last 8760 hours.  CBG:  Recent Labs Lab 02/20/15 0623  GLUCAP 111*    Radiological Exams on Admission: Dg Chest 2 View  02/19/2015   CLINICAL DATA:  Preoperative evaluation for cardiac catheterization, known shortness of Breath  EXAM: CHEST  2 VIEW  COMPARISON:  01/03/2015  FINDINGS: Cardiac shadow is within normal limits. The lungs are clear bilaterally. The previously seen bibasilar infiltrates have resolved. No acute bony abnormality is seen. Degenerative changes of the thoracic spine are again noted.  IMPRESSION: No active cardiopulmonary disease.   Electronically Signed   By: Inez Catalina M.D.   On: 02/19/2015 09:21   Ct Head Wo Contrast  02/20/2015   CLINICAL DATA:  Left-sided numbness upon waking.  EXAM: CT HEAD WITHOUT CONTRAST  TECHNIQUE: Contiguous axial images were obtained from the  base of the skull through the vertex without intravenous contrast.  COMPARISON:  Head CT 11/24/2007, brain MRI 11/30/2007  FINDINGS: No intracranial hemorrhage, mass effect, or midline shift. No hydrocephalus. The basilar cisterns are patent. No evidence of territorial infarct. No intracranial fluid collection. Calvarium is intact. Included paranasal sinuses and mastoid air cells are well aerated.  IMPRESSION: No acute intracranial abnormality.   Electronically Signed   By: Jeb Levering M.D.   On: 02/20/2015 06:55   Dg Chest Port 1 View  02/20/2015   CLINICAL DATA:  Awoke this morning with numbness in left arm and left leg.  EXAM: PORTABLE CHEST - 1 VIEW  COMPARISON:  Frontal and lateral views 02/19/2015  FINDINGS: The cardiomediastinal contours are unchanged allowing for differences in technique. Pulmonary vasculature is normal. No consolidation, pleural effusion, or pneumothorax. No acute osseous abnormalities are seen. There is degenerative change throughout the spine.  IMPRESSION: No acute pulmonary process.   Electronically Signed   By: Jeb Levering M.D.   On: 02/20/2015 06:50    EKG: Independently reviewed. A-fib with stable inverted T-waves in inferolateral leads  Assessment/Plan Principal Problem:   TIA (transient ischemic attack) Active Problems:   Thrombocytopenia   Essential hypertension   CAD (coronary artery disease)   PAF (paroxysmal atrial fibrillation)   Leukopenia   Chronic combined systolic and diastolic heart failure, NYHA class 1  ---  TIA -  Neurology to follow -  MRI/MRA brain -  Carotid duplex -  ECHO done 12/2014 -  EF was 40-45% with inferior and lateral hypokinesis to akinesis, MV has possible flail subvalvular cord with moderate MR.  No need to repeat per neurology -  Telemetry for arrhythmia, however, already know he has PAF -  Continue eliquis and aspirin and high dose statin -  PT/OT, speech eval -  A1c, lipid panel  Left arm heaviness and SOB in  setting of known CAD -  Scheduled for right and left heart catheterization next week - will notify Dr. Debara Pickett of admission -  Telemetry -  Cycle troponins -  Already on ASA, high dose statin, BB, imdur -  Add prn nitroglycerin  HTN/HLD, blood pressures mildly elevated -  Continue ARB, BB  PAF -  CHADS2vasc 4 -  Rate controlled a-fib currently -  Continue BB  Chronic systolic and diastolic heart failure, appears euolemic -  Continue ARB, BB, lasix -  Consider addition of spironolactone  AAA s/p stent graft 2010, stable.  Gout/arthritism, stable.  Leukopenia, mild neutropenia  -  Check HIV -  Trend WBC  Chronic mild thrombocytopenia, near baseline -  Trend platelets  CKD stage III, creatinine near baseline  -  Minimize nephrotoxins, renally dose medications  Diet:  Healthy heart Access:  PIV IVF:  off Proph:  eliquis  Code Status: full Family Communication: patient and wife Disposition Plan: Admit to telemetry  Time spent: 60 min Janece Canterbury [facility] Pager [phone]  If 7PM-7AM, please contact night-coverage [email or web address] Password [facility] 02/20/2015, 9:17 AM

## 2015-02-20 NOTE — ED Notes (Signed)
Pt is scheduled to have a heart catheterization on Thursday of next week

## 2015-02-20 NOTE — ED Notes (Signed)
Pt complains of waking up and his left side feeling funny, was normal when he went to bed. Pt also states that he's scheduled for a cardiac cath next week and is nervous about that.

## 2015-02-20 NOTE — Consult Note (Signed)
Referring Physician: Short    Chief Complaint: Left arm and leg decreased sensation  HPI:                                                                                                                                         Rodney [name] is an 74 y.o. male with a past medical history significant for HTN, hyperlipidemia, CAD, PAF, s/p AAA repair, who went to bed at 2300 hours last night.  She awoke this am 0500 and noted he had tingling in his left arm and leg. This lasted for about 15-20 minutes and then resolved.  He had no weakness or facial involvement. He has Afib and is on Eliquis and ASA. He admits to missing one dose two days ago but took the second dose that day. Currently he is back to baseline.  He does smoke 10 cigarettes per day.  CT brain was personally reviewed and showed no acute abnormality.  Date last known well: Date: 02/19/2015 Time last known well: Time: 23:00 tPA Given: No: out of window and symptoms resolved Modified Rankin: Rankin Score=0    Past Medical History  Diagnosis Date  . Hypertension   . Hyperlipidemia   . Gout   . CAD (coronary artery disease)     cath 2010  . AAA (abdominal aortic aneurysm)     stent graft put in 02/2009  . Arthritis   . Paroxysmal atrial fibrillation   . Tobacco abuse     Past Surgical History  Procedure Laterality Date  . Abdominal aortic aneurysm repair  02/19/2009    performed by [facility]  . Colonoscopy    . Knee arthroscopy  2003    left  . Shoulder arthroscopy with rotator cuff repair and subacromial decompression Left 11/19/2013    Procedure: LEFT SHOULDER ARTHROSCOPY DEBRIDEMENT EXTENTSIVE DISTAL CLAVICULECTOMY DECOMPRESSION PARTIAL ACROMIOPLASTY WITH CORACOACROMIAL WITH ROTATOR CUFF REPAIR ;  Surgeon: Renette Butters, MD;  Location: [place];  Service: Orthopedics;  Laterality: Left;    Family History  Problem Relation Age of Onset  . Diabetes Sister   . Hypertension Sister   . Cancer Father      prostate  . Hypertension Mother   . Stroke Neg Hx   . Heart attack Neg Hx    Social History:  reports that he has been smoking Cigarettes.  He has a 25 pack-year smoking history. He has never used smokeless tobacco. He reports that he drinks about 4.2 - 4.8 oz of alcohol per week. He reports that he does not use illicit drugs.  Allergies:  Allergies  Allergen Reactions  . Iohexol      Code: RASH, Desc: PAITENT STATED THAT HE BEGAN ITCHING TOWARDS END OF INJECTIN OF IV CONTRAST-- 13 HR PREP RECOMMENDED/MMS   . Lisinopril Cough    Medications:  No current facility-administered medications for this encounter.   Current Outpatient Prescriptions  Medication Sig Dispense Refill  . apixaban (ELIQUIS) 5 MG TABS tablet Take 1 tablet (5 mg total) by mouth 2 (two) times daily. 60 tablet 6  . aspirin 81 MG tablet Take 81 mg by mouth daily.    . carvedilol (COREG) 6.25 MG tablet Take 1 tablet by mouth 2 (two) times daily.    . CRESTOR 40 MG tablet Take 40 mg by mouth daily.  4  . fish oil-omega-3 fatty acids 1000 MG capsule Take 2 g by mouth daily.    . furosemide (LASIX) 20 MG tablet Take 1 tablet (20 mg total) by mouth daily. 30 tablet 0  . isosorbide mononitrate (IMDUR) 30 MG 24 hr tablet Take 1 tablet (30 mg total) by mouth daily. 30 tablet 0  . Multiple Vitamin (MULTIVITAMIN) capsule Take 1 capsule by mouth daily.    . potassium chloride (K-DUR) 10 MEQ tablet Take 1 tablet (10 mEq total) by mouth daily. 30 tablet 0  . valsartan (DIOVAN) 160 MG tablet Take 1 tablet (160 mg total) by mouth daily. 30 tablet 5     ROS:                                                                                                                                       History obtained from the patient  General ROS: negative for - chills, fatigue, fever, night sweats, weight gain or weight  loss Psychological ROS: negative for - behavioral disorder, hallucinations, memory difficulties, mood swings or suicidal ideation Ophthalmic ROS: negative for - blurry vision, double vision, eye pain or loss of vision ENT ROS: negative for - epistaxis, nasal discharge, oral lesions, sore throat, tinnitus or vertigo Allergy and Immunology ROS: negative for - hives or itchy/watery eyes Hematological and Lymphatic ROS: negative for - bleeding problems, bruising or swollen lymph nodes Endocrine ROS: negative for - galactorrhea, hair pattern changes, polydipsia/polyuria or temperature intolerance Respiratory ROS: negative for - cough, hemoptysis, shortness of breath or wheezing Cardiovascular ROS: negative for - chest pain, dyspnea on exertion, edema or irregular heartbeat Gastrointestinal ROS: negative for - abdominal pain, diarrhea, hematemesis, nausea/vomiting or stool incontinence Genito-Urinary ROS: negative for - dysuria, hematuria, incontinence or urinary frequency/urgency Musculoskeletal ROS: negative for - joint swelling or muscular weakness Neurological ROS: as noted in HPI Dermatological ROS: negative for rash and skin lesion changes  Physical Examination:  Blood pressure 163/94, pulse 92, temperature 98 F (36.7 C), temperature source Oral, resp. rate 16, height 6\' 2"  (1.88 m), weight 142.883 kg (315 lb), SpO2 97 %.  HEENT-  Normocephalic, no lesions, without obvious abnormality.  Normal external eye and conjunctiva.  Normal TM's bilaterally.  Normal auditory canals and external ears. Normal external nose, mucus membranes and septum.  Normal pharynx. Cardiovascular- irregularly irregular rhythm, pulses palpable throughout   Lungs- chest clear, no wheezing, rales, normal symmetric air entry Abdomen- normal findings: bowel sounds normal Extremities- no edema Lymph-no adenopathy  palpable Musculoskeletal-no joint tenderness, deformity or swelling Skin-warm and dry, no hyperpigmentation, vitiligo, or suspicious lesions  Neurological Examination Mental Status: Alert, oriented, thought content appropriate.  Speech fluent without evidence of aphasia.  Able to follow 3 step commands without difficulty. Cranial Nerves: II: Discs flat bilaterally; Visual fields grossly normal, pupils equal, round, reactive to light and accommodation III,IV, VI: ptosis not present, extra-ocular motions intact bilaterally V,VII: smile symmetric, facial light touch sensation normal bilaterally VIII: hearing normal bilaterally IX,X: uvula rises symmetrically XI: bilateral shoulder shrug XII: midline tongue extension Motor: Right : Upper extremity   5/5    Left:     Upper extremity   5/5  Lower extremity   5/5     Lower extremity   5/5 Tone and bulk:normal tone throughout; no atrophy noted Sensory: Pinprick and light touch intact throughout, bilaterally Deep Tendon Reflexes: 2+ and symmetric throughout Plantars: Right: downgoing   Left: downgoing Cerebellar: normal finger-to-nose, normal rapid alternating movements and normal heel-to-shin test Gait: normal gait and station       Lab Results: Basic Metabolic Panel:  Recent Labs Lab 02/19/15 0927 02/20/15 0618  NA 139 139  K 4.1 3.8  CL 108 111  CO2 24 21  GLUCOSE 97 118*  BUN 14 18  CREATININE 1.33 1.31  CALCIUM 9.0 8.7    Liver Function Tests:  Recent Labs Lab 02/20/15 0618  AST 41*  ALT 46  ALKPHOS 63  BILITOT 0.6  PROT 7.1  ALBUMIN 4.0   No results for input(s): LIPASE, AMYLASE in the last 168 hours. No results for input(s): AMMONIA in the last 168 hours.  CBC:  Recent Labs Lab 02/19/15 0927 02/20/15 0618  WBC 3.6* 3.2*  NEUTROABS  --  1.2*  HGB 15.2 15.3  HCT 43.3 45.0  MCV 91.7 92.6  PLT 129* 132*    Cardiac Enzymes: No results for input(s): CKTOTAL, CKMB, CKMBINDEX, TROPONINI in the last  168 hours.  Lipid Panel: No results for input(s): CHOL, TRIG, HDL, CHOLHDL, VLDL, LDLCALC in the last 168 hours.  CBG:  Recent Labs Lab 02/20/15 0623  GLUCAP 111*    Microbiology: No results found for this or any previous visit.  Coagulation Studies:  Recent Labs  02/19/15 0927 02/20/15 0618  LABPROT 14.8 14.9  INR 1.16 1.16    Imaging: Dg Chest 2 View  02/19/2015   CLINICAL DATA:  Preoperative evaluation for cardiac catheterization, known shortness of Breath  EXAM: CHEST  2 VIEW  COMPARISON:  01/03/2015  FINDINGS: Cardiac shadow is within normal limits. The lungs are clear bilaterally. The previously seen bibasilar infiltrates have resolved. No acute bony abnormality is seen. Degenerative changes of the thoracic spine are again noted.  IMPRESSION: No active cardiopulmonary disease.   Electronically Signed   By: Inez Catalina M.D.   On: 02/19/2015 09:21   Ct Head Wo Contrast  02/20/2015   CLINICAL DATA:  Left-sided numbness upon waking.  EXAM: CT HEAD WITHOUT CONTRAST  TECHNIQUE: Contiguous axial images were obtained from the base of the skull through the vertex without intravenous contrast.  COMPARISON:  Head CT 11/24/2007, brain MRI 11/30/2007  FINDINGS: No intracranial hemorrhage, mass effect, or midline shift. No hydrocephalus. The basilar cisterns are patent. No evidence of territorial infarct. No intracranial fluid collection. Calvarium is intact. Included paranasal sinuses and mastoid air cells are well aerated.  IMPRESSION: No acute intracranial abnormality.   Electronically Signed   By: Jeb Levering M.D.   On: 02/20/2015 06:55   Dg Chest Port 1 View  02/20/2015   CLINICAL DATA:  Awoke this morning with numbness in left arm and left leg.  EXAM: PORTABLE CHEST - 1 VIEW  COMPARISON:  Frontal and lateral views 02/19/2015  FINDINGS: The cardiomediastinal contours are unchanged allowing for differences in technique. Pulmonary vasculature is normal. No consolidation, pleural  effusion, or pneumothorax. No acute osseous abnormalities are seen. There is degenerative change throughout the spine.  IMPRESSION: No acute pulmonary process.   Electronically Signed   By: Jeb Levering M.D.   On: 02/20/2015 06:50       Assessment and plan discussed with with attending physician and they are in agreement.    Etta Quill PA-C [facility] [phone]  02/20/2015, 9:15 AM   Assessment: 74 y.o. male with transient left arm and leg decreased sensation which has fully resolved. Given risk factors of hyperlipidemia, HTN and Afib cannot rule out CVA versus TIA.   Stroke Risk Factors - hyperlipidemia and hypertension  1. HgbA1c, fasting lipid panel 2. MRI, MRA  of the brain without contrast 3. PT consult, OT consult, Speech consult 4. Carotid dopplers 5. Prophylactic therapy-current home dose of Eliquis and ASA 6. Risk factor modification 7. Telemetry monitoring 8. Frequent neuro checks 9 NPO until passes stroke swallow screen  Patient seen and examined together with physician assistant and I concur with the assessment and plan.  Dorian Pod, MD

## 2015-02-20 NOTE — ED Notes (Signed)
Pt states he woke up this morning and his left side felt funny  Pt states he has numbness in his arm and leg on the left side  Pt states it feels better now than it did earlier but still does not feel normal  No neuro deficits noted upon exam

## 2015-02-20 NOTE — ED Notes (Signed)
[facility] on way

## 2015-02-20 NOTE — ED Provider Notes (Addendum)
TIME SEEN: 6:00 AM  CHIEF COMPLAINT: Left-sided numbness and weakness  HPI: Pt is a 74 y.o. male with history of hypertension, hyperlipidemia, CAD, AAA status post graft in 2010 who presents to the emergency department with left-sided numbness and weakness that was present when he woke up at 5 AM today. Reports he went to bed normally around 11 PM. States he refills his symptoms are improving. Denies facial droop, changes in his speech or vision. No headache. Patient is on Eliquis. Denies prior history of CVA or TIA. States he did have some very mild chest pain and shortness of breath with EMS that is now resolved. Is scheduled for cardiac catheterization next week.  ROS: See HPI Constitutional: no fever  Eyes: no drainage  ENT: no runny nose   Cardiovascular:   chest pain  Resp:  SOB  GI: no vomiting GU: no dysuria Integumentary: no rash  Allergy: no hives  Musculoskeletal: no leg swelling  Neurological: no slurred speech ROS otherwise negative  PAST MEDICAL HISTORY/PAST SURGICAL HISTORY:  Past Medical History  Diagnosis Date  . Hypertension   . Hyperlipidemia   . Gout   . CAD (coronary artery disease)     cath 2010  . AAA (abdominal aortic aneurysm)     stent graft put in 02/2009  . Arthritis     MEDICATIONS:  Prior to Admission medications   Medication Sig Start Date End Date Taking? Authorizing Provider  apixaban (ELIQUIS) 5 MG TABS tablet Take 1 tablet (5 mg total) by mouth 2 (two) times daily. 01/29/15  Yes [name], MD  aspirin 81 MG tablet Take 81 mg by mouth daily.   Yes Historical Provider, MD  carvedilol (COREG) 6.25 MG tablet Take 1 tablet by mouth 2 (two) times daily. 01/25/15  Yes Historical Provider, MD  CRESTOR 40 MG tablet Take 40 mg by mouth daily. 11/05/14  Yes Historical Provider, MD  fish oil-omega-3 fatty acids 1000 MG capsule Take 2 g by mouth daily.   Yes Historical Provider, MD  furosemide (LASIX) 20 MG tablet Take 1 tablet (20 mg total) by mouth  daily. 01/05/15  Yes Robbie Lis, MD  isosorbide mononitrate (IMDUR) 30 MG 24 hr tablet Take 1 tablet (30 mg total) by mouth daily. 01/05/15  Yes Robbie Lis, MD  Multiple Vitamin (MULTIVITAMIN) capsule Take 1 capsule by mouth daily.   Yes Historical Provider, MD  potassium chloride (K-DUR) 10 MEQ tablet Take 1 tablet (10 mEq total) by mouth daily. 01/05/15  Yes Robbie Lis, MD  valsartan (DIOVAN) 160 MG tablet Take 1 tablet (160 mg total) by mouth daily. 02/11/15  Yes [name], MD    ALLERGIES:  Allergies  Allergen Reactions  . Iohexol      Code: RASH, Desc: PAITENT STATED THAT HE BEGAN ITCHING TOWARDS END OF INJECTIN OF IV CONTRAST-- 13 HR PREP RECOMMENDED/MMS   . Lisinopril Cough    SOCIAL HISTORY:  History  Substance Use Topics  . Smoking status: Current Every Day Smoker -- 0.25 packs/day for 50 years    Types: Cigarettes  . Smokeless tobacco: Never Used  . Alcohol Use: 4.2 - 4.8 oz/week    6 Cans of beer, 1-2 Shots of liquor per week     Comment: occ    FAMILY HISTORY: Family History  Problem Relation Age of Onset  . Diabetes Sister   . Hypertension Sister   . Cancer Father     prostate  . Hypertension Mother  EXAM: BP 164/97 mmHg  Pulse 103  Temp(Src) 98.5 F (36.9 C) (Oral)  Resp 18  Ht 6\' 2"  (1.88 m)  Wt 315 lb (142.883 kg)  BMI 40.43 kg/m2  SpO2 100% CONSTITUTIONAL: Alert and oriented and responds appropriately to questions. Well-appearing; well-nourished HEAD: Normocephalic EYES: Conjunctivae clear, PERRL ENT: normal nose; no rhinorrhea; moist mucous membranes; pharynx without lesions noted NECK: Supple, no meningismus, no LAD  CARD: RRR; S1 and S2 appreciated; no murmurs, no clicks, no rubs, no gallops RESP: Normal chest excursion without splinting or tachypnea; breath sounds clear and equal bilaterally; no wheezes, no rhonchi, no rales,  ABD/GI: Normal bowel sounds; non-distended; soft, non-tender, no rebound, no guarding BACK:  The back  appears normal and is non-tender to palpation, there is no CVA tenderness EXT: Normal ROM in all joints; non-tender to palpation; no edema; normal capillary refill; no cyanosis    SKIN: Normal color for age and race; warm NEURO: Moves all extremities equally, strength 5/5 in all 4 extremity, sensation to light touch intact diffusely, cranial nerves II through XII intact, no dysmetria to finger-nose testing bilaterally, NIH stroke scale is 0 PSYCH: The patient's mood and manner are appropriate. Grooming and personal hygiene are appropriate.  MEDICAL DECISION MAKING: Patient here with possible TIA. Workup with left-sided numbness and weakness in his arm and leg have since resolved. Stroke scale is now 0. He is neurologically intact. Did have some chest pain shortness of breath that he attributes to anxiety with EMS but this is also gone. EKG appears just in atrial fibrillation which is a new rhythm for the patient. He is on Eliquis chronically. Will obtain labs, CT head, chest x-ray. Anticipate admission for TIA workup.  ED PROGRESS: Patient's labs are unremarkable. Head CT shows no acute abnormality. Discussed with Dr. Armida Sans with neurology who agrees to see the patient consult. Discussed with Dr. Berneice Heinrich with hospitalist service who agrees to telemetry, observation admission.     EKG Interpretation  Date/Time:  Friday February 20 2015 05:54:34 EST Ventricular Rate:  111 PR Interval:    QRS Duration: 102 QT Interval:  341 QTC Calculation: 463 R Axis:   10 Text Interpretation:  Atrial fibrillation Ventricular premature complex Repol abnrm suggests ischemia, diffuse leads; unchanged Atrial fibrillation in new rhythm Confirmed by Kamsiyochukwu Spickler,  DO, [place] ([id number]) on 02/20/2015 6:18:40 AM        [place], DO 02/20/15 [place], DO 02/20/15 0093

## 2015-02-20 NOTE — ED Notes (Signed)
Report given to [facility], Therapist, sports at [facility]

## 2015-02-20 NOTE — ED Notes (Signed)
md at bedside

## 2015-02-20 NOTE — ED Notes (Signed)
[facility] at bedside 

## 2015-02-21 DIAGNOSIS — E785 Hyperlipidemia, unspecified: Secondary | ICD-10-CM

## 2015-02-21 DIAGNOSIS — G459 Transient cerebral ischemic attack, unspecified: Secondary | ICD-10-CM

## 2015-02-21 DIAGNOSIS — I480 Paroxysmal atrial fibrillation: Secondary | ICD-10-CM

## 2015-02-21 DIAGNOSIS — D696 Thrombocytopenia, unspecified: Secondary | ICD-10-CM

## 2015-02-21 DIAGNOSIS — Z720 Tobacco use: Secondary | ICD-10-CM

## 2015-02-21 LAB — CBC WITH DIFFERENTIAL/PLATELET
BASOS PCT: 0 % (ref 0–1)
Basophils Absolute: 0 10*3/uL (ref 0.0–0.1)
Eosinophils Absolute: 0.1 10*3/uL (ref 0.0–0.7)
Eosinophils Relative: 2 % (ref 0–5)
HCT: 43.3 % (ref 39.0–52.0)
HEMOGLOBIN: 15 g/dL (ref 13.0–17.0)
Lymphocytes Relative: 44 % (ref 12–46)
Lymphs Abs: 1.8 10*3/uL (ref 0.7–4.0)
MCH: 31.6 pg (ref 26.0–34.0)
MCHC: 34.6 g/dL (ref 30.0–36.0)
MCV: 91.4 fL (ref 78.0–100.0)
MONO ABS: 0.3 10*3/uL (ref 0.1–1.0)
Monocytes Relative: 8 % (ref 3–12)
Neutro Abs: 1.8 10*3/uL (ref 1.7–7.7)
Neutrophils Relative %: 46 % (ref 43–77)
Platelets: 122 10*3/uL — ABNORMAL LOW (ref 150–400)
RBC: 4.74 MIL/uL (ref 4.22–5.81)
RDW: 13.4 % (ref 11.5–15.5)
WBC: 4 10*3/uL (ref 4.0–10.5)

## 2015-02-21 LAB — LIPID PANEL
CHOLESTEROL: 125 mg/dL (ref 0–200)
HDL: 43 mg/dL (ref >39)
LDL Cholesterol: 47 mg/dL (ref 0–99)
TRIGLYCERIDES: 173 mg/dL — AB (ref <150)
Total CHOL/HDL Ratio: 2.9 RATIO
VLDL: 35 mg/dL (ref 0–40)

## 2015-02-21 LAB — TROPONIN I: Troponin I: <0.03 ng/mL (ref <0.031)

## 2015-02-21 MED ORDER — NICOTINE 14 MG/24HR TD PT24: 14.0000 mg | MEDICATED_PATCH | Freq: Every day | TRANSDERMAL | Status: DC

## 2015-02-21 NOTE — Progress Notes (Signed)
OT Cancellation Note  Patient Details Name: Rodney [name] MRN: [id number] DOB: 1940/12/30   Cancelled Treatment:    Reason Eval/Treat Not Completed: OT screened, no needs identified, will sign off  Benito Mccreedy OTR/L [phone] 02/21/2015, 4:41 PM

## 2015-02-21 NOTE — Progress Notes (Signed)
Pt d/c to home by car with family. Assessment stable. Prescriptions given. Pt verbalizes understanding of d/c instructions.

## 2015-02-21 NOTE — Progress Notes (Signed)
STROKE TEAM PROGRESS NOTE   HISTORY Rodney [name] is a 74 y.o. male with a past medical history significant for HTN, hyperlipidemia, CAD, PAF, s/p AAA repair, who went to bed at 2300 hours last night. He awoke this am 0500 and noted he had tingling in his left arm and leg. This lasted for about 15-20 minutes and then resolved. He had no weakness or facial involvement. He has Afib and is on Eliquis and ASA. He admits to missing one dose two days ago but took the second dose that day. Currently he is back to baseline.  He does smoke 10 cigarettes per day.  CT brain was personally reviewed and showed no acute abnormality.  Date last known well: Date: 02/19/2015 Time last known well: Time: 23:00 tPA Given: No: out of window and symptoms resolved Modified Rankin: Rankin Score=0     SUBJECTIVE (INTERVAL HISTORY) The patient's wife is at the bedside. Again, his symptoms lasted for about 15 minutes and then completely resolved. The patient has been compliant with his anticoagulation and aspirin. The patient probably has had some shortness of breath and chest pain over a month ago. He currently is due to have cardiac catheterization because of persistent shortness of breath and wanted to know about having this done.   OBJECTIVE Temp:  [97.7 F (36.5 C)-98.8 F (37.1 C)] 98.1 F (36.7 C) (03/12 1315) Pulse Rate:  [52-113] 62 (03/12 1315) Cardiac Rhythm:  [-] Atrial fibrillation (03/11 2152) Resp:  [16-18] 18 (03/12 1315) BP: (90-162)/(55-98) 140/71 mmHg (03/12 1315) SpO2:  [95 %-100 %] 98 % (03/12 1315) Weight:  [97.523 kg (215 lb)] 97.523 kg (215 lb) (03/11 1800)   Recent Labs Lab 02/20/15 0623  GLUCAP 111*    Recent Labs Lab 02/19/15 0927 02/20/15 0618  NA 139 139  K 4.1 3.8  CL 108 111  CO2 24 21  GLUCOSE 97 118*  BUN 14 18  CREATININE 1.33 1.31  CALCIUM 9.0 8.7    Recent Labs Lab 02/20/15 0618  AST 41*  ALT 46  ALKPHOS 63  BILITOT 0.6  PROT 7.1  ALBUMIN 4.0     Recent Labs Lab 02/19/15 0927 02/20/15 0618 02/21/15 0555  WBC 3.6* 3.2* 4.0  NEUTROABS  --  1.2* 1.8  HGB 15.2 15.3 15.0  HCT 43.3 45.0 43.3  MCV 91.7 92.6 91.4  PLT 129* 132* 122*    Recent Labs Lab 02/20/15 1307 02/20/15 1821 02/20/15 2306  TROPONINI <0.03 <0.03 <0.03    Recent Labs  02/19/15 0927 02/20/15 0618  LABPROT 14.8 14.9  INR 1.16 1.16    Recent Labs  02/20/15 0653  COLORURINE YELLOW  LABSPEC 1.013  PHURINE 5.5  GLUCOSEU NEGATIVE  HGBUR MODERATE*  BILIRUBINUR NEGATIVE  KETONESUR NEGATIVE  PROTEINUR 30*  UROBILINOGEN 1.0  NITRITE NEGATIVE  LEUKOCYTESUR NEGATIVE       Component Value Date/Time   CHOL 125 02/21/2015 0555   TRIG 173* 02/21/2015 0555   HDL 43 02/21/2015 0555   CHOLHDL 2.9 02/21/2015 0555   VLDL 35 02/21/2015 0555   LDLCALC 47 02/21/2015 0555   No results found for: HGBA1C No results found for: LABOPIA, COCAINSCRNUR, LABBENZ, AMPHETMU, THCU, LABBARB  No results for input(s): ETH in the last 168 hours.  Dg Chest 2 View 02/19/2015    No active cardiopulmonary disease.    Ct Head Wo Contrast 02/20/2015    No acute intracranial abnormality.    MRI / MRA Brain Wo Contrast 02/20/2015    1.  No acute intracranial abnormality.  2. Mild periventricular white matter changes are slightly advanced for age. This likely reflects the sequela of chronic microvascular ischemia.  3. Moderate distal small vessel attenuation in both the anterior and posterior circulation likely reflects atherosclerotic and hypertensive.  4. No significant proximal stenosis, aneurysm, or branch vessel occlusion.      Dg Chest Port 1 View 02/20/2015    No acute pulmonary process.       PHYSICAL EXAM  GENERAL: Very pleasant man in no acute distress.  HEENT: unremarkable.  ABDOMEN: soft  EXTREMITIES: No edema   BACK:  Unremarkable.  SKIN: Normal by inspection.    MENTAL STATUS: Alert and oriented. Speech, language and cognition are generally  intact. Judgment and insight normal.   CRANIAL NERVES: Pupils are equal, round and reactive to light and accomodation; extra ocular movements are full, there is no significant nystagmus; visual fields are full; upper and lower facial muscles are normal in strength and symmetric, there is no flattening of the nasolabial folds; tongue is midline; uvula is midline; shoulder elevation is normal.  MOTOR: Normal tone, bulk and strength; no pronator drift.  COORDINATION: Left finger to nose is normal, right finger to nose is normal, No rest tremor; no intention tremor; no postural tremor; no bradykinesia.      ASSESSMENT/PLAN Rodney [name] is a 74 y.o. male with history of hypertension, hyperlipidemia, coronary artery disease, paroxysmal atrial fibrillation, previous AAA repair, with ongoing tobacco use presenting with transient tingling of his left arm and leg that resolved after 15-20 minutes. He did not receive IV t-PA due to resolution of deficits.   TIA:  Non-dominant secondary to atrial fibrillation with recently missed Eliquis dose.  Resultant resolution of deficits  MRI  no acute intracranial abnormality  MRA  small vessel disease  Carotid Doppler  Pending - preliminary Preliminary report: 1-39% ICA stenosis. Vertebral artery flow is antegrade.  2D Echo  pending  LDL 47  HgbA1c pending  Eliquis for VTE prophylaxis    Heart healthy diet with thin liquids  aspirin 81 mg orally every day and eliquis (apixaban) prior to admission, now on aspirin 81 mg orally every day and eliquis (apixaban)  Patient counseled to be compliant with his antithrombotic medications  Ongoing aggressive stroke risk factor management  Therapy recommendations: Pending  Disposition: Pending The patient was to have a cardiac catheterization done on Thursday. Given his acute ischemic event, consider delaying for 2 weeks since this appears to be an elective procedure.  Follow up in stroke clinic  2 months.  Hypertension  Home meds: Coreg 6.25 mg twice daily and Diovan 160 mg daily  Stable  Hyperlipidemia  Home meds:  Crestor and fish oil resumed in hospital  LDL 47, goal < 70  Continue statin at discharge    Other Stroke Risk Factors  Advanced age  Cigarette smoker, advised to stop smoking  ETOH use  Coronary artery disease  Atrial fibrillation  Other Active Problems      Other Pertinent History    Hospital day #   Mikey Bussing PA-C [facility] Pager [phone] 02/21/2015, 1:18 PM   The patient was seen and examined by me; notes, chart and tests reviewed and discussed with midlevel provider, other providers, patient, and family.     To contact Stroke Continuity provider, please refer to [email or web address] After hours, contact General Neurology

## 2015-02-21 NOTE — Discharge Instructions (Signed)
Transient Ischemic Attack  A transient ischemic attack (TIA) is a "warning stroke" that causes stroke-like symptoms. Unlike a stroke, a TIA does not cause permanent damage to the brain. The symptoms of a TIA can happen very fast and do not last long. It is important to know the symptoms of a TIA and what to do. This can help prevent a major stroke or death.  CAUSES   · A TIA is caused by a temporary blockage in an artery in the brain or neck (carotid artery). The blockage does not allow the brain to get the blood supply it needs and can cause different symptoms. The blockage can be caused by either:  ¨ A blood clot.  ¨ Fatty buildup (plaque) in a neck or brain artery.  RISK FACTORS  · High blood pressure (hypertension).  · High cholesterol.  · Diabetes mellitus.  · Heart disease.  · The build up of plaque in the blood vessels (peripheral artery disease or atherosclerosis).  · The build up of plaque in the blood vessels providing blood and oxygen to the brain (carotid artery stenosis).  · An abnormal heart rhythm (atrial fibrillation).  · Obesity.  · Smoking.  · Taking oral contraceptives (especially in combination with smoking).  · Physical inactivity.  · A diet high in fats, salt (sodium), and calories.  · Alcohol use.  · Use of illegal drugs (especially cocaine and methamphetamine).  · Being male.  · Being African American.  · Being over the age of 55.  · Family history of stroke.  · Previous history of blood clots, stroke, TIA, or heart attack.  · Sickle cell disease.  SYMPTOMS   TIA symptoms are the same as a stroke but are temporary. These symptoms usually develop suddenly, or may be newly present upon awakening from sleep:  · Sudden weakness or numbness of the face, arm, or leg, especially on one side of the body.  · Sudden trouble walking or difficulty moving arms or legs.  · Sudden confusion.  · Sudden personality changes.  · Trouble speaking (aphasia) or understanding.  · Difficulty swallowing.  · Sudden  trouble seeing in one or both eyes.  · Double vision.  · Dizziness.  · Loss of balance or coordination.  · Sudden severe headache with no known cause.  · Trouble reading or writing.  · Loss of bowel or bladder control.  · Loss of consciousness.  DIAGNOSIS   Your caregiver may be able to determine the presence or absence of a TIA based on your symptoms, history, and physical exam. Computed tomography (CT scan) of the brain is usually performed to help identify a TIA. Other tests may be done to diagnose a TIA. These tests may include:  · Electrocardiography.  · Continuous heart monitoring.  · Echocardiography.  · Carotid ultrasonography.  · Magnetic resonance imaging (MRI).  · A scan of the brain circulation.  · Blood tests.  PREVENTION   The risk of a TIA can be decreased by appropriately treating high blood pressure, high cholesterol, diabetes, heart disease, and obesity and by quitting smoking, limiting alcohol, and staying physically active.  TREATMENT   Time is of the essence. Since the symptoms of TIA are the same as a stroke, it is important to seek treatment as soon as possible because you may need a medicine to dissolve the clot (thrombolytic) that cannot be given if too much time has passed. Treatment options vary. Treatment options may include rest, oxygen, intravenous (IV) fluids,   and medicines to thin the blood (anticoagulants). Medicines and diet may be used to address diabetes, high blood pressure, and other risk factors. Measures will be taken to prevent short-term and long-term complications, including infection from breathing foreign material into the lungs (aspiration pneumonia), blood clots in the legs, and falls. Treatment options include procedures to either remove plaque in the carotid arteries or dilate carotid arteries that have narrowed due to plaque. Those procedures are:  · Carotid endarterectomy.  · Carotid angioplasty and stenting.  HOME CARE INSTRUCTIONS   · Take all medicines prescribed  by your caregiver. Follow the directions carefully. Medicines may be used to control risk factors for a stroke. Be sure you understand all your medicine instructions.  · You may be told to take aspirin or the anticoagulant warfarin. Warfarin needs to be taken exactly as instructed.  ¨ Taking too much or too little warfarin is dangerous. Too much warfarin increases the risk of bleeding. Too little warfarin continues to allow the risk for blood clots. While taking warfarin, you will need to have regular blood tests to measure your blood clotting time. A PT blood test measures how long it takes for blood to clot. Your PT is used to calculate another value called an INR. Your PT and INR help your caregiver to adjust your dose of warfarin. The dose can change for many reasons. It is critically important that you take warfarin exactly as prescribed.  ¨ Many foods, especially foods high in vitamin K can interfere with warfarin and affect the PT and INR. Foods high in vitamin K include spinach, kale, broccoli, cabbage, collard and turnip greens, brussels sprouts, peas, cauliflower, seaweed, and parsley as well as beef and pork liver, green tea, and soybean oil. You should eat a consistent amount of foods high in vitamin K. Avoid major changes in your diet, or notify your caregiver before changing your diet. Arrange a visit with a dietitian to answer your questions.  ¨ Many medicines can interfere with warfarin and affect the PT and INR. You must tell your caregiver about any and all medicines you take, this includes all vitamins and supplements. Be especially cautious with aspirin and anti-inflammatory medicines. Do not take or discontinue any prescribed or over-the-counter medicine except on the advice of your caregiver or pharmacist.  ¨ Warfarin can have side effects, such as excessive bruising or bleeding. You will need to hold pressure over cuts for longer than usual. Your caregiver or pharmacist will discuss other  potential side effects.  ¨ Avoid sports or activities that may cause injury or bleeding.  ¨ Be mindful when shaving, flossing your teeth, or handling sharp objects.  ¨ Alcohol can change the body's ability to handle warfarin. It is best to avoid alcoholic drinks or consume only very small amounts while taking warfarin. Notify your caregiver if you change your alcohol intake.  ¨ Notify your dentist or other caregivers before procedures.  · Eat a diet that includes 5 or more servings of fruits and vegetables each day. This may reduce the risk of stroke. Certain diets may be prescribed to address high blood pressure, high cholesterol, diabetes, or obesity.  ¨ A low-sodium, low-saturated fat, low-trans fat, low-cholesterol diet is recommended to manage high blood pressure.  ¨ A low-saturated fat, low-trans fat, low-cholesterol, and high-fiber diet may control cholesterol levels.  ¨ A controlled-carbohydrate, controlled-sugar diet is recommended to manage diabetes.  ¨ A reduced-calorie, low-sodium, low-saturated fat, low-trans fat, low-cholesterol diet is recommended to manage obesity.  ·   Maintain a healthy weight.  · Stay physically active. It is recommended that you get at least 30 minutes of activity on most or all days.  · Do not smoke.  · Limit alcohol use even if you are not taking warfarin. Moderate alcohol use is considered to be:  ¨ No more than 2 drinks each day for men.  ¨ No more than 1 drink each day for nonpregnant women.  · Stop drug abuse.  · Home safety. A safe home environment is important to reduce the risk of falls. Your caregiver may arrange for specialists to evaluate your home. Having grab bars in the bedroom and bathroom is often important. Your caregiver may arrange for equipment to be used at home, such as raised toilets and a seat for the shower.  · Follow all instructions for follow-up with your caregiver. This is very important. This includes any referrals and lab tests. Proper follow up can  prevent a stroke or another TIA from occurring.  SEEK MEDICAL CARE IF:  · You have personality changes.  · You have difficulty swallowing.  · You are seeing double.  · You have dizziness.  · You have a fever.  · You have skin breakdown.  SEEK IMMEDIATE MEDICAL CARE IF:   Any of these symptoms may represent a serious problem that is an emergency. Do not wait to see if the symptoms will go away. Get medical help right away. Call your local emergency services (911 in [place] Do not drive yourself to the hospital.  · You have sudden weakness or numbness of the face, arm, or leg, especially on one side of the body.  · You have sudden trouble walking or difficulty moving arms or legs.  · You have sudden confusion.  · You have trouble speaking (aphasia) or understanding.  · You have sudden trouble seeing in one or both eyes.  · You have a loss of balance or coordination.  · You have a sudden, severe headache with no known cause.  · You have new chest pain or an irregular heartbeat.  · You have a partial or total loss of consciousness.  MAKE SURE YOU:   · Understand these instructions.  · Will watch your condition.  · Will get help right away if you are not doing well or get worse.  Document Released: 09/07/2005 Document Revised: 12/03/2013 Document Reviewed: 03/05/2014  [employer]® Patient Information ©2015 [employer]. This information is not intended to replace advice given to you by your health care provider. Make sure you discuss any questions you have with your health care provider.

## 2015-02-21 NOTE — Evaluation (Signed)
Speech Language Pathology Evaluation Patient Details Name: Rodney [name] MRN: [id number] DOB: 1941-08-06 Today's Date: 02/21/2015 Time: 5732-2025 SLP Time Calculation (min) (ACUTE ONLY): 15 min  Problem List:  Patient Active Problem List   Diagnosis Date Noted  . TIA (transient ischemic attack) 02/20/2015  . 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 Chronic combined systolic and diastolic heart failure, NYHA class 1 02/20/2015  . PAF (paroxysmal atrial fibrillation) 01/29/2015  . Accelerated hypertension 01/03/2015  . Acute diastolic CHF (congestive heart failure), NYHA class 2 01/03/2015  . Demand ischemia 01/03/2015  . CAD (coronary artery disease) 01/03/2015  . Troponin level elevated   . Thrombocytopenia   . Essential hypertension   . S/P abdominal aortic aneurysm repair 07/03/2012   Past Medical History:  Past Medical History  Diagnosis Date  . Hypertension   . Hyperlipidemia   . Gout   . CAD (coronary artery disease)     cath 2010  . AAA (abdominal aortic aneurysm)     stent graft put in 02/2009  . Arthritis   . Paroxysmal atrial fibrillation   . Tobacco abuse    Past Surgical History:  Past Surgical History  Procedure Laterality Date  . Abdominal aortic aneurysm repair  02/19/2009    performed by [facility]  . Colonoscopy    . Knee arthroscopy  2003    left  . Shoulder arthroscopy with rotator cuff repair and subacromial decompression Left 11/19/2013    Procedure: LEFT SHOULDER ARTHROSCOPY DEBRIDEMENT EXTENTSIVE DISTAL CLAVICULECTOMY DECOMPRESSION PARTIAL ACROMIOPLASTY WITH CORACOACROMIAL WITH ROTATOR CUFF REPAIR ;  Surgeon: Renette Butters, MD;  Location: [facility];  Service: Orthopedics;  Laterality: Left;   HPI:  Pt is a 73 y.o. male with PMH of hypertension, hyperlipidemia, CAD s/p cath in 2010 who is scheduled for right and left heart cath next week, chronic systolic and diastolic heart failure, AAA s/p stent graft in 2010, gout who presents with left sided  heaviness.Recently had abnormal stress test. Has had some tingling in arm and leg but no weakness. No speech difficulties were noted. MRI revealed no acute abnormality. SLP eval ordered as part of stroke workup (pt passed stroke swallow screen).    Assessment / Plan / Recommendation Clinical Impression  Pt demonstrated cognition, motor speech, and language skills within normal limits for tasks assessed (orientation, memory, problem solving, direction following, naming, conversation, safety awareness). Pt expresses no concerns in these areas. Given these findings, no SLP needs noted- speech will sign off. Please re-consult if needs arise.    SLP Assessment  Patient does not need any further Speech Lanaguage Pathology Services    Follow Up Recommendations  None    Frequency and Duration        Pertinent Vitals/Pain Pain Assessment: No/denies pain   SLP Goals     SLP Evaluation Prior Functioning  Cognitive/Linguistic Baseline: Within functional limits Type of Home: House  Lives With: Spouse Available Help at Discharge: Family Vocation: Retired   Associate Professor  Overall Cognitive Status: Within Functional Limits for tasks assessed Arousal/Alertness: Awake/alert Orientation Level: Oriented X4 Attention: Alternating Alternating Attention: Appears intact Memory: Appears intact Awareness: Appears intact Problem Solving: Appears intact Safety/Judgment: Appears intact    Comprehension  Auditory Comprehension Overall Auditory Comprehension: Appears within functional limits for tasks assessed Yes/No Questions: Within Functional Limits Commands: Within Functional Limits Conversation: Complex Reading Comprehension Reading Status: Within funtional limits    Expression Expression Primary Mode of Expression: Verbal Verbal Expression Overall Verbal Expression: Appears within functional limits for tasks  assessed Initiation: No impairment Level of Generative/Spontaneous Verbalization:  Conversation Naming: No impairment Pragmatics: No impairment Non-Verbal Means of Communication: Not applicable Written Expression Dominant Hand: Right Written Expression: Within Functional Limits   Oral / Motor Oral Motor/Sensory Function Overall Oral Motor/Sensory Function: Appears within functional limits for tasks assessed Motor Speech Overall Motor Speech: Appears within functional limits for tasks assessed   GO Functional Assessment Tool Used: clinical judgment Functional Limitations: Other Speech Language Pathology Other Speech-Language Pathology Functional Limitation [phone]): 0 percent impaired, limited or restricted   Kern Reap, MA, CCC-SLP 02/21/2015, 12:43 PM

## 2015-02-21 NOTE — Progress Notes (Signed)
UR completed 

## 2015-02-21 NOTE — Evaluation (Signed)
Physical Therapy Evaluation Patient Details Name: Rodney [name] MRN: [id number] DOB: 12-03-1941 Today's Date: 02/21/2015   History of Present Illness  Patient is a 74 yo male admitted 02/20/15 with Lt sided weakness/heaviness.  PMH:  HTN, HLD, CAD, CHF, AAA repair, arthritis, gout, PAF  Clinical Impression  Patient is independent with all mobility and gait.  Patient scored 24/24 on DGI balance assessment.  No acute PT needs identified - PT will sign off.    Follow Up Recommendations No PT follow up    Equipment Recommendations  None recommended by PT    Recommendations for Other Services       Precautions / Restrictions Precautions Precautions: None Restrictions Weight Bearing Restrictions: No      Mobility  Bed Mobility Overal bed mobility: Independent                Transfers Overall transfer level: Independent Equipment used: None                Ambulation/Gait Ambulation/Gait assistance: Independent Ambulation Distance (Feet): 250 Feet Assistive device: None Gait Pattern/deviations: WFL(Within Functional Limits)   Gait velocity interpretation: at or above normal speed for age/gender General Gait Details: Patient demonstrates good gait pattern, balance, and speed.  Stairs Stairs: Yes Stairs assistance: Independent Stair Management: No rails;Alternating pattern;Forwards Number of Stairs: 3 General stair comments: No assist required.   Wheelchair Mobility    Modified Rankin (Stroke Patients Only) Modified Rankin (Stroke Patients Only) Pre-Morbid Rankin Score: No symptoms Modified Rankin: No symptoms     Balance                                 Standardized Balance Assessment Standardized Balance Assessment : Dynamic Gait Index   Dynamic Gait Index Level Surface: Normal Change in Gait Speed: Normal Gait with Horizontal Head Turns: Normal Gait with Vertical Head Turns: Normal Gait and Pivot Turn: Normal Step Over  Obstacle: Normal Step Around Obstacles: Normal Steps: Normal Total Score: 24       Pertinent Vitals/Pain Pain Assessment: No/denies pain    Home Living Family/patient expects to be discharged to:: Private residence Living Arrangements: Spouse/significant other Available Help at Discharge: Family;Available 24 hours/day Type of Home: House Home Access: Stairs to enter   [employer] of Steps: 2 Home Layout: One level Home Equipment: None      Prior Function Level of Independence: Independent               Hand Dominance   Dominant Hand: Right    Extremity/Trunk Assessment   Upper Extremity Assessment: Overall WFL for tasks assessed           Lower Extremity Assessment: Overall WFL for tasks assessed      Cervical / Trunk Assessment: Normal  Communication   Communication: No difficulties  Cognition Arousal/Alertness: Awake/alert Behavior During Therapy: WFL for tasks assessed/performed Overall Cognitive Status: Within Functional Limits for tasks assessed                      General Comments      Exercises        Assessment/Plan    PT Assessment Patent does not need any further PT services  PT Diagnosis Abnormality of gait   PT Problem List    PT Treatment Interventions     PT Goals (Current goals can be found in the Care Plan section) Acute Rehab PT Goals  PT Goal Formulation: All assessment and education complete, DC therapy    Frequency     Barriers to discharge        Co-evaluation               End of Session Equipment Utilized During Treatment: Gait belt Activity Tolerance: Patient tolerated treatment well Patient left: in bed;with call bell/phone within reach Nurse Communication: Mobility status    Functional Assessment Tool Used: Clinical judgement Functional Limitation: Mobility: Walking and moving around Mobility: Walking and Moving Around Current Status (P5093): 0 percent impaired, limited or  restricted Mobility: Walking and Moving Around Goal Status [phone]): 0 percent impaired, limited or restricted Mobility: Walking and Moving Around Discharge Status [phone]): 0 percent impaired, limited or restricted    Time: 1127-1140 PT Time Calculation (min) (ACUTE ONLY): 13 min   Charges:   PT Evaluation $Initial PT Evaluation Tier I: 1 Procedure     PT G Codes:   PT G-Codes **NOT FOR INPATIENT CLASS** Functional Assessment Tool Used: Clinical judgement Functional Limitation: Mobility: Walking and moving around Mobility: Walking and Moving Around Current Status (X8338): 0 percent impaired, limited or restricted Mobility: Walking and Moving Around Goal Status (S5053): 0 percent impaired, limited or restricted Mobility: Walking and Moving Around Discharge Status (Z7673): 0 percent impaired, limited or restricted    Despina Pole 02/21/2015, 3:16 PM Carita Pian. Sanjuana Kava, [place] Pager [phone]

## 2015-02-21 NOTE — Discharge Summary (Signed)
Physician Discharge Summary  Rodney [name] NFA:[id number] DOB: February 25, 1941 DOA: 02/20/2015  PCP: [name], MD  Admit date: 02/20/2015 Discharge date: 02/21/2015  Time spent: 30 minutes  Recommendations for Outpatient Follow-up:  1. Follow BMET to assess electrolytes and renal function 2. Repeat CBC to follow WBC's and platelets trend 3. Dr. Debara Pickett Please delay heart cath for 2 weeks given TIA presentation 4. Reassess BP and adjust medications as needed  Discharge Diagnoses:  Principal Problem:   TIA (transient ischemic attack) Active Problems:   Thrombocytopenia   Essential hypertension   CAD (coronary artery disease)   PAF (paroxysmal atrial fibrillation)   Leukopenia   Chronic combined systolic and diastolic heart failure, NYHA class 1   Tobacco abuse   HLD (hyperlipidemia)   Discharge Condition: stable and improved. No CP, SOB or neurologic deficit at discharge. Patient to follow with PCP in 10 days.  Diet recommendation: heart healthy diet   Filed Weights   02/20/15 0556 02/20/15 1800  Weight: 142.883 kg (315 lb) 97.523 kg (215 lb)    History of present illness:  74 y.o. year-old male with history of hypertension, hyperlipidemia, CAD s/p cath in 2010 who is scheduled for right and left heart cath next week, chronic systolic and diastolic heart failure, AAA s/p stent graft in 2010, gout who presents with left sided heaviness. The patient has been followed closely by Cardiology secondary to CAD with abnormal stress test recently. He was supposed to undergo right and left heart catheterization next week. He takes eliquis and aspirin and has been compliant with his medications. For the last few days, he has felt like his normal self. He lives at home and is ambulatory/drives. This morning, he awoke around 5am with some heaviness in his left arm and leg. He denies changes to his face, slurred speech, blurry vision, confusion. He had some tingling in his arm and  leg but no weakness. He was able to quickly get dressed and wake his wife to come to the hospital. Wife states she did not notice any facial droop, confusion, slurred speech, or weakness. Symptoms lasted about 30 minutes. On the way to the hospital, he had a 30-second episode of SOB, but denies chest heaviness or tightness.   Hospital Course:  TIA - Neurology to follow in 2 months - MRI/MRA brain: neg for acute infarcts  - Carotid duplex: with just mild ICA stenosis and bilateral antegrade vertebral flow - ECHO done 12/2014 - EF was 40-45% with inferior and lateral hypokinesis to akinesis, MV has possible flail subvalvular cord with moderate MR. No need to repeat per neurology -plan is i to continue eliquis and ASA for secondary prevention -continue statin -patient advised to quit smoking  Left arm heaviness and SOB in setting of known CAD - Scheduled for right and left heart catheterization next week - will notify Dr. Debara Pickett; given TIA to delay cath for 2 weeks as recommended by neurology - Telemetry and EKG w/o acute ischemic changes - neg troponin - Continue ASA, high dose statin, BB and imdur  HTN/HLD, blood pressures fairly well controlled - Continue ARB, BB -advise to follow heart healthy/low sodium diet  PAF - CHADS2vasc = 4 - Rate controlled a-fib currently - Continue BB and Eliquis  Chronic systolic and diastolic heart failure, appears euvolemic and compensated - Continue ARB, BB, lasix - advise to follow heart healthy diet and to check weight on daily basis  AAA s/p stent graft 2010, stable. -no abdominal pain -no murmurs  appreciated on exam  Gout/arthritism, stable. -no acute flare  Leukopenia, mild neutropenia  - HIV neg - repeat CBC with normal range WBC  Chronic mild thrombocytopenia, near baseline - Trend platelets with CBC in follow up visit  CKD stage III, creatinine near baseline  - Minimize nephrotoxins -maintain adequate  hydration   Procedures:  Carotid duplex: 1-39% ICA stenosis. Vertebral artery flow is antegrade.  MRI/MRA: with small vessel disease, but no acute intracranial abnormalities appreciated  Consultations:  Neurology   Discharge Exam: Filed Vitals:   02/21/15 1315  BP: 140/71  Pulse: 62  Temp: 98.1 F (36.7 C)  Resp: 18    General: afebrile, no CP, no SOB and currently w/o any focal neurologic or motor deficit Cardiovascular: rate controlled, no rubs or gallops Respiratory: CTA bilaterally Abd: soft, NT, ND, positive BS Extremities: no edema or cyanosis  Discharge Instructions   Discharge Instructions    Diet - low sodium heart healthy    Complete by:  As directed      Discharge instructions    Complete by:  As directed   Take medications as prescribed Follow a low sodium/heart healthy diet Maintain good hydration Stop smoking Follow with PCP in 10 days Follow up with neurology service in 2 months          Current Discharge Medication List    START taking these medications   Details  nicotine (NICODERM CQ - DOSED IN MG/24 HOURS) 14 mg/24hr patch Place 1 patch (14 mg total) onto the skin daily. Qty: 28 patch, Refills: 0      CONTINUE these medications which have NOT CHANGED   Details  apixaban (ELIQUIS) 5 MG TABS tablet Take 1 tablet (5 mg total) by mouth 2 (two) times daily. Qty: 60 tablet, Refills: 6    aspirin 81 MG tablet Take 81 mg by mouth daily.    carvedilol (COREG) 6.25 MG tablet Take 1 tablet by mouth 2 (two) times daily.    CRESTOR 40 MG tablet Take 40 mg by mouth daily. Refills: 4    fish oil-omega-3 fatty acids 1000 MG capsule Take 2 g by mouth daily.    furosemide (LASIX) 20 MG tablet Take 1 tablet (20 mg total) by mouth daily. Qty: 30 tablet, Refills: 0    isosorbide mononitrate (IMDUR) 30 MG 24 hr tablet Take 1 tablet (30 mg total) by mouth daily. Qty: 30 tablet, Refills: 0    Multiple Vitamin (MULTIVITAMIN) capsule Take 1 capsule  by mouth daily.    potassium chloride (K-DUR) 10 MEQ tablet Take 1 tablet (10 mEq total) by mouth daily. Qty: 30 tablet, Refills: 0    valsartan (DIOVAN) 160 MG tablet Take 1 tablet (160 mg total) by mouth daily. Qty: 30 tablet, Refills: 5       Allergies  Allergen Reactions  . Iohexol      Code: RASH, Desc: PAITENT STATED THAT HE BEGAN ITCHING TOWARDS END OF INJECTIN OF IV CONTRAST-- 13 HR PREP RECOMMENDED/MMS   . Lisinopril Cough   Follow-up Information    Follow up with ROBERTS, Sharol Given, MD. Schedule an appointment as soon as possible for a visit in 10 days.   Specialty:  Internal Medicine   Why:  for hospital follow up   Contact information:   [place], [place] [address] [phone]       Follow up with Antony Contras, MD. Call in 2 months.   Specialties:  Neurology, Radiology   Why:  contact office for  appointment details around that time   Contact information:   [address] [place] [address] [phone]       The results of significant diagnostics from this hospitalization (including imaging, microbiology, ancillary and laboratory) are listed below for reference.    Significant Diagnostic Studies: Dg Chest 2 View  02/19/2015   CLINICAL DATA:  Preoperative evaluation for cardiac catheterization, known shortness of Breath  EXAM: CHEST  2 VIEW  COMPARISON:  01/03/2015  FINDINGS: Cardiac shadow is within normal limits. The lungs are clear bilaterally. The previously seen bibasilar infiltrates have resolved. No acute bony abnormality is seen. Degenerative changes of the thoracic spine are again noted.  IMPRESSION: No active cardiopulmonary disease.   Electronically Signed   By: Inez Catalina M.D.   On: 02/19/2015 09:21   Ct Head Wo Contrast  02/20/2015   CLINICAL DATA:  Left-sided numbness upon waking.  EXAM: CT HEAD WITHOUT CONTRAST  TECHNIQUE: Contiguous axial images were obtained from the base of the skull through the vertex without  intravenous contrast.  COMPARISON:  Head CT 11/24/2007, brain MRI 11/30/2007  FINDINGS: No intracranial hemorrhage, mass effect, or midline shift. No hydrocephalus. The basilar cisterns are patent. No evidence of territorial infarct. No intracranial fluid collection. Calvarium is intact. Included paranasal sinuses and mastoid air cells are well aerated.  IMPRESSION: No acute intracranial abnormality.   Electronically Signed   By: Jeb Levering M.D.   On: 02/20/2015 06:55   Mr Brain Wo Contrast  02/20/2015   CLINICAL DATA:  Heaviness and tingling in the left upper and lower extremity. Symptoms lasted approximately 30 minutes and then resolved. Patient is scheduled for cardiac catheterization next week.  EXAM: MRI HEAD WITHOUT CONTRAST  MRA HEAD WITHOUT CONTRAST  TECHNIQUE: Multiplanar, multiecho pulse sequences of the brain and surrounding structures were obtained without intravenous contrast. Angiographic images of the head were obtained using MRA technique without contrast.  COMPARISON:  CT head without contrast from the same day. MRI of the brain 11/30/2007.  FINDINGS: MRI HEAD FINDINGS  The diffusion-weighted images demonstrate no evidence for acute or subacute infarct.  No acute hemorrhage or mass lesion is present. The ventricles are of normal size. Mild periventricular white matter changes are noted bilaterally. No significant extraaxial fluid collection is present.  Flow is present in the major intracranial arteries. The globes and orbits are within normal limits. Polyps and mucosal thickening are noted along the floor of the maxillary sinuses bilaterally. There is mild mucosal thickening in the anterior ethmoid air cells and inferior frontal sinuses. The sphenoid sinuses are clear. Minimal fluid is present in the left mastoid air cells. No obstructing nasopharyngeal lesion is evident.  MRA HEAD FINDINGS  Internal carotid arteries are within normal limits from the high cervical segments through the ICA  termini bilaterally. The A1 and M1 segments are normal. Anterior communicating artery is not visualized. The MCA bifurcations are intact. There is moderate attenuation of distal MCA branch vessels without proximal stenosis or occlusion.  The vertebral arteries are codominant. The right PICA is visualized and normal. The left AICA is dominant. The basilar artery is within normal limits.  The right posterior cerebral artery originates from the basilar tip. The left posterior cerebral artery is fed by P1 segment and posterior communicating artery. There is moderate attenuation of distal PCA branch vessels without significant proximal stenosis or occlusion.  IMPRESSION: 1. No acute intracranial abnormality. 2. Mild periventricular white matter changes are slightly advanced for age. This likely  reflects the sequela of chronic microvascular ischemia. 3. Moderate distal small vessel attenuation in both the anterior and posterior circulation likely reflects atherosclerotic and hypertensive. 4. No significant proximal stenosis, aneurysm, or branch vessel occlusion.   Electronically Signed   By: San Morelle M.D.   On: 02/20/2015 18:37   Dg Chest Port 1 View  02/20/2015   CLINICAL DATA:  Awoke this morning with numbness in left arm and left leg.  EXAM: PORTABLE CHEST - 1 VIEW  COMPARISON:  Frontal and lateral views 02/19/2015  FINDINGS: The cardiomediastinal contours are unchanged allowing for differences in technique. Pulmonary vasculature is normal. No consolidation, pleural effusion, or pneumothorax. No acute osseous abnormalities are seen. There is degenerative change throughout the spine.  IMPRESSION: No acute pulmonary process.   Electronically Signed   By: Jeb Levering M.D.   On: 02/20/2015 06:50   Mr Jodene Nam Head/brain Wo Cm  02/20/2015   CLINICAL DATA:  Heaviness and tingling in the left upper and lower extremity. Symptoms lasted approximately 30 minutes and then resolved. Patient is scheduled for  cardiac catheterization next week.  EXAM: MRI HEAD WITHOUT CONTRAST  MRA HEAD WITHOUT CONTRAST  TECHNIQUE: Multiplanar, multiecho pulse sequences of the brain and surrounding structures were obtained without intravenous contrast. Angiographic images of the head were obtained using MRA technique without contrast.  COMPARISON:  CT head without contrast from the same day. MRI of the brain 11/30/2007.  FINDINGS: MRI HEAD FINDINGS  The diffusion-weighted images demonstrate no evidence for acute or subacute infarct.  No acute hemorrhage or mass lesion is present. The ventricles are of normal size. Mild periventricular white matter changes are noted bilaterally. No significant extraaxial fluid collection is present.  Flow is present in the major intracranial arteries. The globes and orbits are within normal limits. Polyps and mucosal thickening are noted along the floor of the maxillary sinuses bilaterally. There is mild mucosal thickening in the anterior ethmoid air cells and inferior frontal sinuses. The sphenoid sinuses are clear. Minimal fluid is present in the left mastoid air cells. No obstructing nasopharyngeal lesion is evident.  MRA HEAD FINDINGS  Internal carotid arteries are within normal limits from the high cervical segments through the ICA termini bilaterally. The A1 and M1 segments are normal. Anterior communicating artery is not visualized. The MCA bifurcations are intact. There is moderate attenuation of distal MCA branch vessels without proximal stenosis or occlusion.  The vertebral arteries are codominant. The right PICA is visualized and normal. The left AICA is dominant. The basilar artery is within normal limits.  The right posterior cerebral artery originates from the basilar tip. The left posterior cerebral artery is fed by P1 segment and posterior communicating artery. There is moderate attenuation of distal PCA branch vessels without significant proximal stenosis or occlusion.  IMPRESSION: 1. No  acute intracranial abnormality. 2. Mild periventricular white matter changes are slightly advanced for age. This likely reflects the sequela of chronic microvascular ischemia. 3. Moderate distal small vessel attenuation in both the anterior and posterior circulation likely reflects atherosclerotic and hypertensive. 4. No significant proximal stenosis, aneurysm, or branch vessel occlusion.   Electronically Signed   By: San Morelle M.D.   On: 02/20/2015 18:37   Labs: Basic Metabolic Panel:  Recent Labs Lab 02/19/15 0927 02/20/15 0618  NA 139 139  K 4.1 3.8  CL 108 111  CO2 24 21  GLUCOSE 97 118*  BUN 14 18  CREATININE 1.33 1.31  CALCIUM 9.0 8.7   Liver Function  Tests:  Recent Labs Lab 02/20/15 0618  AST 41*  ALT 46  ALKPHOS 63  BILITOT 0.6  PROT 7.1  ALBUMIN 4.0   CBC:  Recent Labs Lab 02/19/15 0927 02/20/15 0618 02/21/15 0555  WBC 3.6* 3.2* 4.0  NEUTROABS  --  1.2* 1.8  HGB 15.2 15.3 15.0  HCT 43.3 45.0 43.3  MCV 91.7 92.6 91.4  PLT 129* 132* 122*   Cardiac Enzymes:  Recent Labs Lab 02/20/15 1307 02/20/15 1821 02/20/15 2306  TROPONINI <0.03 <0.03 <0.03   BNP: BNP (last 3 results)  Recent Labs  01/03/15 0714 01/03/15 1354  BNP 331.1* 539.5*    CBG:  Recent Labs Lab 02/20/15 0623  GLUCAP 111*    Signed:  Barton Dubois  [facility] 02/21/2015, 4:23 PM

## 2015-02-21 NOTE — Progress Notes (Signed)
VASCULAR LAB PRELIMINARY  PRELIMINARY  PRELIMINARY  PRELIMINARY  Carotid Dopplers completed.    Preliminary report:  1-39% ICA stenosis.  Vertebral artery flow is antegrade.  Tima Curet, RVT 02/21/2015, 1:14 PM

## 2015-02-22 LAB — HIV ANTIBODY (ROUTINE TESTING W REFLEX): HIV SCREEN 4TH GENERATION: NONREACTIVE

## 2015-02-23 LAB — HEMOGLOBIN A1C
Hgb A1c MFr Bld: 5.5 % (ref 4.8–5.6)
Mean Plasma Glucose: 111 mg/dL

## 2015-02-24 ENCOUNTER — Telehealth: Payer: Self-pay | Admitting: Internal Medicine

## 2015-02-24 NOTE — Telephone Encounter (Signed)
Left message for patient to call regarding right and left heart cath that needs to be scheduled in 2 weeks.

## 2015-02-25 ENCOUNTER — Other Ambulatory Visit: Payer: Self-pay | Admitting: *Deleted

## 2015-02-25 ENCOUNTER — Encounter: Payer: Self-pay | Admitting: Internal Medicine

## 2015-02-25 ENCOUNTER — Telehealth: Payer: Self-pay | Admitting: Internal Medicine

## 2015-02-25 DIAGNOSIS — R5383 Other fatigue: Secondary | ICD-10-CM

## 2015-02-25 DIAGNOSIS — D689 Coagulation defect, unspecified: Secondary | ICD-10-CM

## 2015-02-25 DIAGNOSIS — Z01812 Encounter for preprocedural laboratory examination: Secondary | ICD-10-CM

## 2015-02-25 DIAGNOSIS — Z79899 Other long term (current) drug therapy: Secondary | ICD-10-CM

## 2015-02-25 NOTE — Telephone Encounter (Signed)
Spoke with patient regarding right and left heart cath.  This is rescheduled for Monday 03/09/15 @ 7:30 am---arrive at Short Stay @ 6:00 am.  NPO after midnight Have blood work done Tuesday 03/03/15.  Patient voiced his understanding---I told him I will mail all of his instructions to him.

## 2015-02-26 ENCOUNTER — Other Ambulatory Visit: Payer: Self-pay

## 2015-02-26 ENCOUNTER — Ambulatory Visit (HOSPITAL_COMMUNITY): Admission: RE | Admit: 2015-02-26 | Payer: Medicare Other | Source: Ambulatory Visit | Admitting: Internal Medicine

## 2015-02-26 ENCOUNTER — Encounter (HOSPITAL_COMMUNITY): Admission: RE | Payer: Self-pay | Source: Ambulatory Visit

## 2015-02-26 DIAGNOSIS — I255 Ischemic cardiomyopathy: Secondary | ICD-10-CM

## 2015-02-26 SURGERY — LEFT AND RIGHT HEART CATHETERIZATION WITH CORONARY ANGIOGRAM
Anesthesia: LOCAL

## 2015-03-02 ENCOUNTER — Emergency Department (HOSPITAL_COMMUNITY): Payer: Medicare Other

## 2015-03-02 ENCOUNTER — Inpatient Hospital Stay (HOSPITAL_COMMUNITY)
Admission: EM | Admit: 2015-03-02 | Discharge: 2015-03-06 | DRG: 249 | Disposition: A | Discharge status: Home / Self Care | Payer: Medicare Other | Attending: Internal Medicine | Admitting: Internal Medicine

## 2015-03-02 ENCOUNTER — Encounter (HOSPITAL_COMMUNITY): Payer: Self-pay | Admitting: Emergency Medicine

## 2015-03-02 DIAGNOSIS — F17210 Nicotine dependence, cigarettes, uncomplicated: Secondary | ICD-10-CM | POA: Diagnosis present

## 2015-03-02 DIAGNOSIS — R079 Chest pain, unspecified: Secondary | ICD-10-CM

## 2015-03-02 DIAGNOSIS — Z8679 Personal history of other diseases of the circulatory system: Secondary | ICD-10-CM

## 2015-03-02 DIAGNOSIS — E785 Hyperlipidemia, unspecified: Secondary | ICD-10-CM | POA: Diagnosis present

## 2015-03-02 DIAGNOSIS — D696 Thrombocytopenia, unspecified: Secondary | ICD-10-CM | POA: Diagnosis present

## 2015-03-02 DIAGNOSIS — Z955 Presence of coronary angioplasty implant and graft: Secondary | ICD-10-CM

## 2015-03-02 DIAGNOSIS — Z720 Tobacco use: Secondary | ICD-10-CM | POA: Diagnosis present

## 2015-03-02 DIAGNOSIS — I2584 Coronary atherosclerosis due to calcified coronary lesion: Secondary | ICD-10-CM | POA: Diagnosis present

## 2015-03-02 DIAGNOSIS — R0600 Dyspnea, unspecified: Secondary | ICD-10-CM | POA: Diagnosis not present

## 2015-03-02 DIAGNOSIS — I2510 Atherosclerotic heart disease of native coronary artery without angina pectoris: Secondary | ICD-10-CM | POA: Diagnosis present

## 2015-03-02 DIAGNOSIS — I200 Unstable angina: Secondary | ICD-10-CM | POA: Diagnosis present

## 2015-03-02 DIAGNOSIS — I10 Essential (primary) hypertension: Secondary | ICD-10-CM | POA: Diagnosis not present

## 2015-03-02 DIAGNOSIS — I719 Aortic aneurysm of unspecified site, without rupture: Secondary | ICD-10-CM | POA: Diagnosis present

## 2015-03-02 DIAGNOSIS — I480 Paroxysmal atrial fibrillation: Secondary | ICD-10-CM | POA: Diagnosis present

## 2015-03-02 DIAGNOSIS — I5042 Chronic combined systolic (congestive) and diastolic (congestive) heart failure: Secondary | ICD-10-CM | POA: Diagnosis present

## 2015-03-02 DIAGNOSIS — R0789 Other chest pain: Secondary | ICD-10-CM | POA: Diagnosis not present

## 2015-03-02 DIAGNOSIS — Z8249 Family history of ischemic heart disease and other diseases of the circulatory system: Secondary | ICD-10-CM

## 2015-03-02 DIAGNOSIS — Z7982 Long term (current) use of aspirin: Secondary | ICD-10-CM

## 2015-03-02 DIAGNOSIS — Z888 Allergy status to other drugs, medicaments and biological substances status: Secondary | ICD-10-CM

## 2015-03-02 DIAGNOSIS — I340 Nonrheumatic mitral (valve) insufficiency: Secondary | ICD-10-CM | POA: Diagnosis not present

## 2015-03-02 DIAGNOSIS — K30 Functional dyspepsia: Secondary | ICD-10-CM | POA: Diagnosis not present

## 2015-03-02 DIAGNOSIS — Z91041 Radiographic dye allergy status: Secondary | ICD-10-CM

## 2015-03-02 DIAGNOSIS — I2583 Coronary atherosclerosis due to lipid rich plaque: Secondary | ICD-10-CM | POA: Diagnosis present

## 2015-03-02 DIAGNOSIS — I25118 Atherosclerotic heart disease of native coronary artery with other forms of angina pectoris: Secondary | ICD-10-CM | POA: Diagnosis not present

## 2015-03-02 DIAGNOSIS — I420 Dilated cardiomyopathy: Secondary | ICD-10-CM | POA: Diagnosis present

## 2015-03-02 DIAGNOSIS — Z95828 Presence of other vascular implants and grafts: Secondary | ICD-10-CM

## 2015-03-02 DIAGNOSIS — Z79899 Other long term (current) drug therapy: Secondary | ICD-10-CM

## 2015-03-02 DIAGNOSIS — Z8673 Personal history of transient ischemic attack (TIA), and cerebral infarction without residual deficits: Secondary | ICD-10-CM

## 2015-03-02 DIAGNOSIS — Z9229 Personal history of other drug therapy: Secondary | ICD-10-CM

## 2015-03-02 DIAGNOSIS — Z7901 Long term (current) use of anticoagulants: Secondary | ICD-10-CM

## 2015-03-02 LAB — BASIC METABOLIC PANEL WITH GFR
Anion gap: 8 (ref 5–15)
BUN: 17 mg/dL (ref 6–23)
CO2: 19 mmol/L (ref 19–32)
Calcium: 8.5 mg/dL (ref 8.4–10.5)
Chloride: 106 mmol/L (ref 96–112)
Creatinine, Ser: 1.3 mg/dL (ref 0.50–1.35)
GFR calc Af Amer: 62 mL/min — ABNORMAL LOW
GFR calc non Af Amer: 53 mL/min — ABNORMAL LOW
Glucose, Bld: 126 mg/dL — ABNORMAL HIGH (ref 70–99)
Potassium: 3.6 mmol/L (ref 3.5–5.1)
Sodium: 133 mmol/L — ABNORMAL LOW (ref 135–145)

## 2015-03-02 LAB — TROPONIN I

## 2015-03-02 LAB — CBC
HCT: 44.9 % (ref 39.0–52.0)
Hemoglobin: 15.7 g/dL (ref 13.0–17.0)
MCH: 31.8 pg (ref 26.0–34.0)
MCHC: 35 g/dL (ref 30.0–36.0)
MCV: 91.1 fL (ref 78.0–100.0)
Platelets: 136 10*3/uL — ABNORMAL LOW (ref 150–400)
RBC: 4.93 MIL/uL (ref 4.22–5.81)
RDW: 13.1 % (ref 11.5–15.5)
WBC: 3.5 10*3/uL — ABNORMAL LOW (ref 4.0–10.5)

## 2015-03-02 LAB — I-STAT TROPONIN, ED: Troponin i, poc: 0.01 ng/mL (ref 0.00–0.08)

## 2015-03-02 LAB — BRAIN NATRIURETIC PEPTIDE: B Natriuretic Peptide: 86.3 pg/mL (ref 0.0–100.0)

## 2015-03-02 MED ORDER — NITROGLYCERIN 0.4 MG SL SUBL
0.4000 mg | SUBLINGUAL_TABLET | SUBLINGUAL | Status: AC | PRN
  Administered 2015-03-05 (×3): 0.4 mg via SUBLINGUAL
  Filled 2015-03-02: qty 1

## 2015-03-02 MED ORDER — SODIUM CHLORIDE 0.9 % IV SOLN
INTRAVENOUS | Status: DC
  Administered 2015-03-03: 06:00:00 via INTRAVENOUS

## 2015-03-02 MED ORDER — ONDANSETRON HCL 4 MG/2ML IJ SOLN: 4.0000 mg | Freq: Four times a day (QID) | INTRAMUSCULAR | Status: DC | PRN

## 2015-03-02 MED ORDER — GI COCKTAIL ~~LOC~~
30.0000 mL | Freq: Four times a day (QID) | ORAL | Status: DC | PRN
  Administered 2015-03-05: 30 mL via ORAL
  Filled 2015-03-02 (×3): qty 30

## 2015-03-02 MED ORDER — SODIUM CHLORIDE 0.9 % IV SOLN: 250.0000 mL | INTRAVENOUS | Status: DC | PRN

## 2015-03-02 MED ORDER — IRBESARTAN 150 MG PO TABS
150.0000 mg | ORAL_TABLET | Freq: Every day | ORAL | Status: DC
  Administered 2015-03-02 – 2015-03-03 (×2): 150 mg via ORAL
  Filled 2015-03-02 (×3): qty 1

## 2015-03-02 MED ORDER — NICOTINE 14 MG/24HR TD PT24
14.0000 mg | MEDICATED_PATCH | Freq: Every day | TRANSDERMAL | Status: DC
  Administered 2015-03-03 – 2015-03-06 (×4): 14 mg via TRANSDERMAL
  Filled 2015-03-02 (×4): qty 1

## 2015-03-02 MED ORDER — SODIUM CHLORIDE 0.9 % IJ SOLN: 3.0000 mL | INTRAMUSCULAR | Status: DC | PRN

## 2015-03-02 MED ORDER — OMEGA-3-ACID ETHYL ESTERS 1 G PO CAPS
1.0000 g | ORAL_CAPSULE | Freq: Every day | ORAL | Status: DC
  Administered 2015-03-02 – 2015-03-06 (×5): 1 g via ORAL
  Filled 2015-03-02 (×6): qty 1

## 2015-03-02 MED ORDER — SODIUM CHLORIDE 0.9 % IJ SOLN: 3.0000 mL | Freq: Two times a day (BID) | INTRAMUSCULAR | Status: DC

## 2015-03-02 MED ORDER — ROSUVASTATIN CALCIUM 40 MG PO TABS
40.0000 mg | ORAL_TABLET | Freq: Every day | ORAL | Status: DC
  Administered 2015-03-02 – 2015-03-06 (×5): 40 mg via ORAL
  Filled 2015-03-02: qty 2
  Filled 2015-03-02: qty 1
  Filled 2015-03-02: qty 2
  Filled 2015-03-02 (×2): qty 1

## 2015-03-02 MED ORDER — ASPIRIN 81 MG PO CHEW
81.0000 mg | CHEWABLE_TABLET | ORAL | Status: AC
  Administered 2015-03-03: 81 mg via ORAL
  Filled 2015-03-02: qty 1

## 2015-03-02 MED ORDER — POTASSIUM CHLORIDE ER 10 MEQ PO TBCR
10.0000 meq | EXTENDED_RELEASE_TABLET | Freq: Every day | ORAL | Status: DC
  Administered 2015-03-02 – 2015-03-06 (×5): 10 meq via ORAL
  Filled 2015-03-02 (×7): qty 1

## 2015-03-02 MED ORDER — ISOSORBIDE MONONITRATE ER 30 MG PO TB24
30.0000 mg | ORAL_TABLET | Freq: Every day | ORAL | Status: DC
  Administered 2015-03-02 – 2015-03-05 (×4): 30 mg via ORAL
  Filled 2015-03-02 (×5): qty 1

## 2015-03-02 MED ORDER — CARVEDILOL 3.125 MG PO TABS
6.2500 mg | ORAL_TABLET | Freq: Two times a day (BID) | ORAL | Status: DC
  Administered 2015-03-02 – 2015-03-06 (×9): 6.25 mg via ORAL
  Filled 2015-03-02: qty 1
  Filled 2015-03-02: qty 2
  Filled 2015-03-02 (×2): qty 1
  Filled 2015-03-02 (×6): qty 2

## 2015-03-02 MED ORDER — ASPIRIN 325 MG PO TABS
325.0000 mg | ORAL_TABLET | Freq: Once | ORAL | Status: AC
  Administered 2015-03-02: 325 mg via ORAL
  Filled 2015-03-02: qty 1

## 2015-03-02 MED ORDER — LABETALOL HCL 5 MG/ML IV SOLN
10.0000 mg | INTRAVENOUS | Status: DC | PRN
  Administered 2015-03-05: 12:00:00 10 mg via INTRAVENOUS
  Filled 2015-03-02: qty 4

## 2015-03-02 MED ORDER — FUROSEMIDE 20 MG PO TABS
20.0000 mg | ORAL_TABLET | Freq: Every day | ORAL | Status: DC
  Administered 2015-03-02 – 2015-03-06 (×5): 20 mg via ORAL
  Filled 2015-03-02 (×5): qty 1

## 2015-03-02 MED ORDER — ASPIRIN EC 81 MG PO TBEC
81.0000 mg | DELAYED_RELEASE_TABLET | Freq: Every day | ORAL | Status: DC
  Filled 2015-03-02: qty 1

## 2015-03-02 MED ORDER — HEPARIN (PORCINE) IN NACL 100-0.45 UNIT/ML-% IJ SOLN
1350.0000 [IU]/h | INTRAMUSCULAR | Status: DC
  Administered 2015-03-02 – 2015-03-03 (×2): 1350 [IU]/h via INTRAVENOUS
  Filled 2015-03-02 (×2): qty 250

## 2015-03-02 MED ORDER — APIXABAN 2.5 MG PO TABS: 5.0000 mg | ORAL_TABLET | Freq: Two times a day (BID) | ORAL | Status: DC

## 2015-03-02 MED ORDER — ACETAMINOPHEN 325 MG PO TABS: 650.0000 mg | ORAL_TABLET | ORAL | Status: DC | PRN

## 2015-03-02 MED ORDER — MORPHINE SULFATE 2 MG/ML IJ SOLN
2.0000 mg | INTRAMUSCULAR | Status: DC | PRN
  Administered 2015-03-05: 15:00:00 2 mg via INTRAVENOUS
  Filled 2015-03-02 (×4): qty 1

## 2015-03-02 MED ORDER — ASPIRIN EC 81 MG PO TBEC: 81.0000 mg | DELAYED_RELEASE_TABLET | Freq: Every day | ORAL | Status: DC

## 2015-03-02 NOTE — ED Provider Notes (Signed)
CSN: [id number]     Arrival date & time 03/02/15  4174 History   First MD Initiated Contact with Patient 03/02/15 [phone]     Chief Complaint  Patient presents with  . Shortness of Breath     (Consider location/radiation/quality/duration/timing/severity/associated sxs/prior Treatment) Patient is a 74 y.o. male presenting with shortness of breath. The history is provided by the patient. No language interpreter was used.  Shortness of Breath Severity:  Moderate Onset quality:  Sudden Duration:  1 hour Timing:  Intermittent Progression:  Waxing and waning Chronicity:  New Relieved by:  Nothing Worsened by:  Nothing tried Ineffective treatments:  None tried Associated symptoms: chest pain (tightness), diaphoresis (1 episode 3 days ago) and fever   Associated symptoms: no abdominal pain, no cough, no headaches, no rash, no sore throat, no sputum production and no vomiting   Associated symptoms comment:  Chest tightness Risk factors: tobacco use   Risk factors: no family hx of DVT, no hx of cancer, no hx of PE/DVT, no obesity and no prolonged immobilization     Past Medical History  Diagnosis Date  . Hypertension   . Hyperlipidemia   . Gout   . CAD (coronary artery disease)     cath 2010  . AAA (abdominal aortic aneurysm)     stent graft put in 02/2009  . Arthritis   . Paroxysmal atrial fibrillation   . Tobacco abuse    Past Surgical History  Procedure Laterality Date  . Abdominal aortic aneurysm repair  02/19/2009    performed by [facility]  . Colonoscopy    . Knee arthroscopy  2003    left  . Shoulder arthroscopy with rotator cuff repair and subacromial decompression Left 11/19/2013    Procedure: LEFT SHOULDER ARTHROSCOPY DEBRIDEMENT EXTENTSIVE DISTAL CLAVICULECTOMY DECOMPRESSION PARTIAL ACROMIOPLASTY WITH CORACOACROMIAL WITH ROTATOR CUFF REPAIR ;  Surgeon: Renette Butters, MD;  Location: [place] [facility];  Service: Orthopedics;  Laterality: Left;   Family History   Problem Relation Age of Onset  . Diabetes Sister   . Hypertension Sister   . Cancer Father     prostate  . Hypertension Mother   . Stroke Neg Hx   . Heart attack Neg Hx    History  Substance Use Topics  . Smoking status: Current Every Day Smoker -- 0.50 packs/day for 50 years    Types: Cigarettes  . Smokeless tobacco: Never Used  . Alcohol Use: 4.2 - 4.8 oz/week    6 Cans of beer, 1-2 Shots of liquor per week     Comment: couple shots of royal per day    Review of Systems  Constitutional: Positive for fever and diaphoresis (1 episode 3 days ago). Negative for activity change, appetite change and fatigue.  HENT: Negative for congestion, facial swelling, rhinorrhea, sore throat and trouble swallowing.   Eyes: Negative for photophobia and pain.  Respiratory: Positive for shortness of breath. Negative for cough, sputum production and chest tightness.   Cardiovascular: Positive for chest pain (tightness). Negative for leg swelling.  Gastrointestinal: Negative for nausea, vomiting, abdominal pain, diarrhea and constipation.  Endocrine: Negative for polydipsia and polyuria.  Genitourinary: Negative for dysuria, urgency, decreased urine volume and difficulty urinating.  Musculoskeletal: Negative for back pain and gait problem.  Skin: Negative for color change, rash and wound.  Allergic/Immunologic: Negative for immunocompromised state.  Neurological: Negative for dizziness, facial asymmetry, speech difficulty, weakness, numbness and headaches.  Psychiatric/Behavioral: Negative for confusion, decreased concentration and agitation.  Allergies  Iohexol and Lisinopril  Home Medications   Prior to Admission medications   Medication Sig Start Date End Date Taking? Authorizing Provider  apixaban (ELIQUIS) 5 MG TABS tablet Take 1 tablet (5 mg total) by mouth 2 (two) times daily. 01/29/15  Yes [name], MD  aspirin 81 MG tablet Take 81 mg by mouth daily.   Yes Historical  Provider, MD  carvedilol (COREG) 6.25 MG tablet Take 1 tablet by mouth 2 (two) times daily. 01/25/15  Yes Historical Provider, MD  CRESTOR 40 MG tablet Take 40 mg by mouth daily. 11/05/14  Yes Historical Provider, MD  Dextromethorphan-Guaifenesin 10-200 MG CAPS Take 1 capsule by mouth every 6 (six) hours as needed (for cold).    Yes Historical Provider, MD  fish oil-omega-3 fatty acids 1000 MG capsule Take 1 g by mouth daily.    Yes Historical Provider, MD  furosemide (LASIX) 20 MG tablet Take 1 tablet (20 mg total) by mouth daily. 01/05/15  Yes Robbie Lis, MD  isosorbide mononitrate (IMDUR) 30 MG 24 hr tablet Take 1 tablet (30 mg total) by mouth daily. 01/05/15  Yes Robbie Lis, MD  Multiple Vitamin (MULTIVITAMIN) capsule Take 1 capsule by mouth daily.   Yes Historical Provider, MD  nicotine (NICODERM CQ - DOSED IN MG/24 HOURS) 14 mg/24hr patch Place 1 patch (14 mg total) onto the skin daily. 02/21/15  Yes Barton Dubois, MD  potassium chloride (K-DUR) 10 MEQ tablet Take 1 tablet (10 mEq total) by mouth daily. 01/05/15  Yes Robbie Lis, MD  valsartan (DIOVAN) 160 MG tablet Take 1 tablet (160 mg total) by mouth daily. 02/11/15  Yes [name], MD   BP 150/91 mmHg  Pulse 62  Temp(Src) 97.4 F (36.3 C) (Oral)  Resp 16  Ht 6\' 2"  (1.88 m)  Wt 215 lb (97.523 kg)  BMI 27.59 kg/m2  SpO2 98% Physical Exam  Constitutional: He is oriented to person, place, and time. He appears well-developed and well-nourished. No distress.  HENT:  Head: Normocephalic and atraumatic.  Mouth/Throat: No oropharyngeal exudate.  Eyes: Pupils are equal, round, and reactive to light.  Neck: Normal range of motion. Neck supple.  Cardiovascular: Normal rate, regular rhythm and normal heart sounds.  Exam reveals no gallop and no friction rub.   No murmur heard. Pulmonary/Chest: Effort normal and breath sounds normal. No respiratory distress. He has no wheezes. He has no rales.  Abdominal: Soft. Bowel sounds are  normal. He exhibits no distension and no mass. There is no tenderness. There is no rebound and no guarding.  Musculoskeletal: Normal range of motion. He exhibits no edema or tenderness.  Neurological: He is alert and oriented to person, place, and time.  Skin: Skin is warm and dry.  Psychiatric: He has a normal mood and affect.    ED Course  Procedures (including critical care time) Labs Review Labs Reviewed  CBC - Abnormal; Notable for the following:    WBC 3.5 (*)    Platelets 136 (*)    All other components within normal limits  BASIC METABOLIC PANEL - Abnormal; Notable for the following:    Sodium 133 (*)    Glucose, Bld 126 (*)    GFR calc non Af Amer 53 (*)    GFR calc Af Amer 62 (*)    All other components within normal limits  BRAIN NATRIURETIC PEPTIDE  I-STAT TROPOININ, ED    Imaging Review Dg Chest 2 View  03/02/2015  CLINICAL DATA:  Chest tightness and difficulty breathing  EXAM: CHEST  2 VIEW  COMPARISON:  February 20, 2015  FINDINGS: There is no edema or consolidation. The heart size and pulmonary vascularity are normal. No adenopathy. There is degenerative change in the thoracic spine.  IMPRESSION: No edema or consolidation.   Electronically Signed   By: Lowella Grip III M.D.   On: 03/02/2015 08:07     EKG Interpretation   Date/Time:  Monday March 02 2015 07:10:56 EDT Ventricular Rate:  95 PR Interval:    QRS Duration: 101 QT Interval:  353 QTC Calculation: 444 R Axis:   22 Text Interpretation:  Atrial fibrillation Abnormal R-wave progression,  early transition Repol abnrm suggests ischemia, anterolateral No  significant change since last tracing on 3/1//16 Confirmed by DOCHERTY   MD, MEGAN [phone]) on 03/02/2015 7:31:22 AM      MDM   Final diagnoses:  None    Pt is a 74 y.o. male with Pmhx as above who presents with 1 wk of intermittent SOB, usually in the mornings with associated chest tightness, shortness of breath lasting about 1 hour chest  tightness lasting approximately 10 minutes.  Symptoms are not aggravated or brought on by exertion.  He has not had associated nausea or vomiting.  He had one episode of diaphoresis 3 days ago.  He also states he has had some left arm discomfort but relates this to the nicotine patch that he started wearing since he recently quit smoking.  He has had a recent abnormal stress test and was scheduled to have a heart catheterization this week, which was delayed.  After he was admitted on 3/11-03/12, for TIA symptoms.  He has no pain or shortness of breath currently.  He has had no leg pain or swelling.  On physical exam vitals are stable and he is in no acute distress.  He is in rate controlled A. fib which she has a history of.  Cardiopulmonary exam is benign.   Dr. Ron Parker with cardiology has seen the patient is recommending medical admission, [facility] will admit.     Ernestina Patches, MD 03/02/15 1124

## 2015-03-02 NOTE — H&P (Signed)
[facility] History and Physical  ESPN ZEMAN CZY:[id number] DOB: 08-08-1941 DOA: 03/02/2015  Referring physician:  PCP: [name], MD   Chief Complaint: Chest pain  HPI: Rodney [name] is a 74 y.o. male patient is a pleasant 74 year old gentleman with a past medical history of hypertension, dyslipidemia, A. fib, chronic anticoagulation, tobacco abuse, presenting to the emergency department with complaints of chest pain. He was recently admitted to our service on 02/20/2015 and discharged on 02/21/2015 undergoing TIA workup. During that hospitalization MRI/MRA were negative. Prior to this hospitalization he had an abnormal stress test that was done in the outpatient setting as heart cath has been scheduled for next week. He presented to the emergency department today complaining of chest pain located in the retrosternal area characterized as tight and pressure-like, nonradiating, associated with shortness of breath that started at 5:00 this morning. He reports symptoms lasted for 10-20 minutes. In the emergency room he was evaluated by cardiology, were plans have been made to move up his cath for tomorrow.                                                  Review of Systems:  Constitutional:  No weight loss, night sweats, Fevers, chills, fatigue.  HEENT:  No headaches, Difficulty swallowing,Tooth/dental problems,Sore throat,  No sneezing, itching, ear ache, nasal congestion, post nasal drip,  Cardio-vascular:  Positive for chest pain, Orthopnea, PND, swelling in lower extremities, anasarca, dizziness, palpitations  GI:  No heartburn, indigestion, abdominal pain, nausea, vomiting, diarrhea, change in bowel habits, loss of appetite  Resp:  Positive for shortness of breath with exertion or at rest. No excess mucus, no productive cough, No non-productive cough, No coughing up of blood.No change in color of mucus.No wheezing.No chest wall deformity  Skin:  no rash or lesions.    GU:  no dysuria, change in color of urine, no urgency or frequency. No flank pain.  Musculoskeletal:  No joint pain or swelling. No decreased range of motion. No back pain.  Psych:  No change in mood or affect. No depression or anxiety. No memory loss.   Past Medical History  Diagnosis Date  . Hypertension   . Hyperlipidemia   . Gout   . CAD (coronary artery disease)     cath 2010  . AAA (abdominal aortic aneurysm)     stent graft put in 02/2009  . Arthritis   . Paroxysmal atrial fibrillation   . Tobacco abuse    Past Surgical History  Procedure Laterality Date  . Abdominal aortic aneurysm repair  02/19/2009    performed by [facility]  . Colonoscopy    . Knee arthroscopy  2003    left  . Shoulder arthroscopy with rotator cuff repair and subacromial decompression Left 11/19/2013    Procedure: LEFT SHOULDER ARTHROSCOPY DEBRIDEMENT EXTENTSIVE DISTAL CLAVICULECTOMY DECOMPRESSION PARTIAL ACROMIOPLASTY WITH CORACOACROMIAL WITH ROTATOR CUFF REPAIR ;  Surgeon: Renette Butters, MD;  Location: [place];  Service: Orthopedics;  Laterality: Left;   Social History:  reports that he has been smoking Cigarettes.  He has a 25 pack-year smoking history. He has never used smokeless tobacco. He reports that he drinks about 4.2 - 4.8 oz of alcohol per week. He reports that he does not use illicit drugs.  Allergies  Allergen Reactions  . Iohexol  Code: RASH, Desc: PAITENT STATED THAT HE BEGAN ITCHING TOWARDS END OF INJECTIN OF IV CONTRAST-- 13 HR PREP RECOMMENDED/MMS   . Lisinopril Cough    Family History  Problem Relation Age of Onset  . Diabetes Sister   . Hypertension Sister   . Cancer Father     prostate  . Hypertension Mother   . Stroke Neg Hx   . Heart attack Neg Hx     Prior to Admission medications   Medication Sig Start Date End Date Taking? Authorizing Provider  apixaban (ELIQUIS) 5 MG TABS tablet Take 1 tablet (5 mg total) by mouth 2 (two) times daily. 01/29/15   Yes [name], MD  aspirin 81 MG tablet Take 81 mg by mouth daily.   Yes Historical Provider, MD  carvedilol (COREG) 6.25 MG tablet Take 1 tablet by mouth 2 (two) times daily. 01/25/15  Yes Historical Provider, MD  CRESTOR 40 MG tablet Take 40 mg by mouth daily. 11/05/14  Yes Historical Provider, MD  Dextromethorphan-Guaifenesin 10-200 MG CAPS Take 1 capsule by mouth every 6 (six) hours as needed (for cold).    Yes Historical Provider, MD  fish oil-omega-3 fatty acids 1000 MG capsule Take 1 g by mouth daily.    Yes Historical Provider, MD  furosemide (LASIX) 20 MG tablet Take 1 tablet (20 mg total) by mouth daily. 01/05/15  Yes Robbie Lis, MD  isosorbide mononitrate (IMDUR) 30 MG 24 hr tablet Take 1 tablet (30 mg total) by mouth daily. 01/05/15  Yes Robbie Lis, MD  Multiple Vitamin (MULTIVITAMIN) capsule Take 1 capsule by mouth daily.   Yes Historical Provider, MD  nicotine (NICODERM CQ - DOSED IN MG/24 HOURS) 14 mg/24hr patch Place 1 patch (14 mg total) onto the skin daily. 02/21/15  Yes Barton Dubois, MD  potassium chloride (K-DUR) 10 MEQ tablet Take 1 tablet (10 mEq total) by mouth daily. 01/05/15  Yes Robbie Lis, MD  valsartan (DIOVAN) 160 MG tablet Take 1 tablet (160 mg total) by mouth daily. 02/11/15  Yes [name], MD   Physical Exam: Filed Vitals:   03/02/15 0711 03/02/15 0923 03/02/15 1100  BP: 156/90 150/91 163/97  Pulse: 98 62 64  Temp: 97.4 F (36.3 C)    TempSrc: Oral    Resp: 14 16 18   Height: 6\' 2"  (1.88 m)    Weight: 97.523 kg (215 lb)    SpO2: 97% 98% 98%    Wt Readings from Last 3 Encounters:  03/02/15 97.523 kg (215 lb)  02/20/15 97.523 kg (215 lb)  02/17/15 97.705 kg (215 lb 6.4 oz)    General:  Patient is currently chest pain-free, appears comfortable, no acute distress Eyes: PERRL, normal lids, irises & conjunctiva ENT: grossly normal hearing, lips & tongue Neck: no LAD, masses or thyromegaly Cardiovascular: RRR, no m/r/g. No LE  edema. Telemetry: SR, no arrhythmias  Respiratory: CTA bilaterally, no w/r/r. Normal respiratory effort. Abdomen: soft, ntnd Skin: no rash or induration seen on limited exam Musculoskeletal: grossly normal tone BUE/BLE Psychiatric: grossly normal mood and affect, speech fluent and appropriate Neurologic: grossly non-focal.          Labs on Admission:  Basic Metabolic Panel:  Recent Labs Lab 03/02/15 0725  NA 133*  K 3.6  CL 106  CO2 19  GLUCOSE 126*  BUN 17  CREATININE 1.30  CALCIUM 8.5   Liver Function Tests: No results for input(s): AST, ALT, ALKPHOS, BILITOT, PROT, ALBUMIN in the last 168 hours.  No results for input(s): LIPASE, AMYLASE in the last 168 hours. No results for input(s): AMMONIA in the last 168 hours. CBC:  Recent Labs Lab 03/02/15 0725  WBC 3.5*  HGB 15.7  HCT 44.9  MCV 91.1  PLT 136*   Cardiac Enzymes: No results for input(s): CKTOTAL, CKMB, CKMBINDEX, TROPONINI in the last 168 hours.  BNP (last 3 results)  Recent Labs  01/03/15 0714 01/03/15 1354 03/02/15 0725  BNP 331.1* 539.5* 86.3    ProBNP (last 3 results) No results for input(s): PROBNP in the last 8760 hours.  CBG: No results for input(s): GLUCAP in the last 168 hours.  Radiological Exams on Admission: Dg Chest 2 View  03/02/2015   CLINICAL DATA:  Chest tightness and difficulty breathing  EXAM: CHEST  2 VIEW  COMPARISON:  February 20, 2015  FINDINGS: There is no edema or consolidation. The heart size and pulmonary vascularity are normal. No adenopathy. There is degenerative change in the thoracic spine.  IMPRESSION: No edema or consolidation.   Electronically Signed   By: Lowella Grip III M.D.   On: 03/02/2015 08:07    EKG: Independently reviewed. A. Fib, no ST segment changes noted  Assessment/Plan Principal Problem:   Chest pain Active Problems:   Dyspnea   Essential hypertension   PAF (paroxysmal atrial fibrillation)   Chronic combined systolic and diastolic heart  failure, NYHA class 1   Tobacco abuse   1. Chest pain. Patient is a 74 year old gentleman with multiple cardiac vascular risk factors including hypertension, dyslipidemia, tobacco abuse, having an abnormal outpatient stress test, presenting to the emergency room with chest pain having typical and atypical features. Initial workup included a troponin which was negative, BNP at 86.3, an EKG that did not show acute ischemic changes. Cardiology was consulted by ED provider, patient to undergo heart cath tomorrow morning. Continue antiplatelet therapy, ARB, beta blocker and statin.  2. Hypertension. Patient have a blood pressure 150/91 in the emergency room, will continue Coreg 6.25 mg by mouth twice a day, Imdur 30 mg by mouth daily, Avapro 150 mg by mouth daily and Lasix 20 mg by mouth daily. Will monitor his blood pressures have when necessary labetalol for systolic blood pressures greater than 165.  3. Paroxysmal atrial fibrillation. EKG performed in the emergency room showing atrial fibrillation having ventricular rates in the 60s. Continue beta blocker therapy with Coreg and anticoagulation.  4. Chronic combined diastolic and systolic congestive heart failure. Last transthoracic echocardiogram performed on 01/03/2015 showing LVEF of 40-45%. Continue Lasix 20 mg by mouth daily. He does not appear volume overloaded on physical exam 5. Tobacco abuse. Continue nicotine patch 6. DVT prophylaxis. Patient fully anticoagulated with Eliquis    Code Status: Full code Family Communication: Spoke to his wife was present at bedside Disposition Plan: Patient to undergo heart cath in a.m., do not anticipate him requiring greater than 2 nights hospitalization will place him in Obs  Time spent: 60 minutes  Kelvin Cellar [facility] Pager [phone]

## 2015-03-02 NOTE — ED Notes (Signed)
Pt states he has been having problems since about the 11th of this month Pt states he was scheduled for a heart cath last Thursday but postponed it til the 28th  Pt states about 0530 he has been feeling short of breath  Pt has no edema noted to his extremities  Pt denies pain

## 2015-03-02 NOTE — Consult Note (Addendum)
CARDIOLOGY CONSULT NOTE   Patient ID: Rodney [name] MRN: [id number] DOB/AGE: 73-Oct-1942 73 y.o.  Admit Date: 03/02/2015  Primary Physician: [name], MD Primary Cardiologist    [place]  Clinical Summary Rodney [name] is a 74 y.o.male. He has known coronary disease. Recently he has had hospital admissions with episodes of CHF. Historically there is coronary disease. Recent echo revealed an ejection fraction of 40-45%. However there is mention of a ruptured cord. There is significant mitral regurgitation. There was no definite flail seen. He also has atrial fibrillation. He then had a nuclear stress study. The ejection fraction was in the range of 28%. There was no definite ischemia. However it was felt that it was appropriate to proceed cardiac catheterization. Before this was completed he was admitted again with a very limited episode of left-sided weakness. He  was hospitalized and  his neurologic studies were negative including an MRI. It was thought that his episode might of been a TIA related to his atrial fib and a missed dose of his anticoagulant. Because of this event, the scheduling of his heart catheterization was moved until March 28. The patient now returns today with another episode of shortness of breath. Because he has had pulmonary edema before, he is admitted for further observation. In addition, I feel it is prudent to proceed with his cardiac catheterization tomorrow. His last dose of Eliquis was yesterday. He will not receive any today. He will be placed on IV heparin. His catheterization will be done in the later part of the schedule tomorrow.   Allergies  Allergen Reactions  . Iohexol      Code: RASH, Desc: PAITENT STATED THAT HE BEGAN ITCHING TOWARDS END OF INJECTIN OF IV CONTRAST-- 13 HR PREP RECOMMENDED/MMS   . Lisinopril Cough    Medications Scheduled Medications: . [START ON 03/03/2015] aspirin EC  81 mg Oral Daily  . carvedilol  6.25 mg Oral BID  .  furosemide  20 mg Oral Daily  . irbesartan  150 mg Oral Daily  . isosorbide mononitrate  30 mg Oral Daily  . nicotine  14 mg Transdermal Daily  . omega-3 acid ethyl esters  1 g Oral Daily  . potassium chloride  10 mEq Oral Daily  . rosuvastatin  40 mg Oral q1800     Infusions:     PRN Medications:  acetaminophen, gi cocktail, labetalol, morphine injection, nitroGLYCERIN, ondansetron (ZOFRAN) IV   Past Medical History  Diagnosis Date  . Hypertension   . Hyperlipidemia   . Gout   . CAD (coronary artery disease)     cath 2010  . AAA (abdominal aortic aneurysm)     stent graft put in 02/2009  . Arthritis   . Paroxysmal atrial fibrillation   . Tobacco abuse     Past Surgical History  Procedure Laterality Date  . Abdominal aortic aneurysm repair  02/19/2009    performed by [facility]  . Colonoscopy    . Knee arthroscopy  2003    left  . Shoulder arthroscopy with rotator cuff repair and subacromial decompression Left 11/19/2013    Procedure: LEFT SHOULDER ARTHROSCOPY DEBRIDEMENT EXTENTSIVE DISTAL CLAVICULECTOMY DECOMPRESSION PARTIAL ACROMIOPLASTY WITH CORACOACROMIAL WITH ROTATOR CUFF REPAIR ;  Surgeon: Renette Butters, MD;  Location: [facility];  Service: Orthopedics;  Laterality: Left;    Family History  Problem Relation Age of Onset  . Diabetes Sister   . Hypertension Sister   . Cancer Father     prostate  .  Hypertension Mother   . Stroke Neg Hx   . Heart attack Neg Hx     Social History Rodney [name] reports that he has been smoking Cigarettes.  He has a 25 pack-year smoking history. He has never used smokeless tobacco. Rodney [name] reports that he drinks about 4.2 - 4.8 oz of alcohol per week.  Review of Systems Patient denies fever, chills, headache, sweats, rash, change in vision, change in hearing, chest pain, nausea or vomiting, urinary symptoms. All other systems are reviewed and are negative.  Physical Examination Blood pressure 168/86, pulse 61,  temperature 97.6 F (36.4 C), temperature source Oral, resp. rate 18, height 6\' 2"  (1.88 m), weight 209 lb 11.2 oz (95.119 kg), SpO2 98 %. No intake or output data in the 24 hours ending 03/02/15 1310  The patient is oriented to person time and place. Affect is normal. His wife is in the room. Head is atraumatic. Sclera and conjunctiva are normal. There is no jugular venous distention. Lungs reveal a few scattered rhonchi. Cardiac exam reveals an S1 and S2. Abdomen is soft. There is no peripheral edema. There are no musculoskeletal deformities. There are no skin rashes. Neurologic is grossly intact.  Prior Cardiac Testing/Procedures  Lab Results  Basic Metabolic Panel:  Recent Labs Lab 03/02/15 0725  NA 133*  K 3.6  CL 106  CO2 19  GLUCOSE 126*  BUN 17  CREATININE 1.30  CALCIUM 8.5    Liver Function Tests: No results for input(s): AST, ALT, ALKPHOS, BILITOT, PROT, ALBUMIN in the last 168 hours.  CBC:  Recent Labs Lab 03/02/15 0725  WBC 3.5*  HGB 15.7  HCT 44.9  MCV 91.1  PLT 136*    Cardiac Enzymes: No results for input(s): CKTOTAL, CKMB, CKMBINDEX, TROPONINI in the last 168 hours.  BNP: Invalid input(s): POCBNP   Radiology: Dg Chest 2 View  03/02/2015   CLINICAL DATA:  Chest tightness and difficulty breathing  EXAM: CHEST  2 VIEW  COMPARISON:  February 20, 2015  FINDINGS: There is no edema or consolidation. The heart size and pulmonary vascularity are normal. No adenopathy. There is degenerative change in the thoracic spine.  IMPRESSION: No edema or consolidation.   Electronically Signed   By: Lowella Grip III M.D.   On: 03/02/2015 08:07     ECG: EKG is done today and reviewed by me. I also looked at old EKGs. There is no significant change in the QRS. He has atrial fib with a controlled rate.   Impression and Recommendations     Chest pain    This morning he had some mild chest discomfort and some shortness of breath. He has recent history of having  pulmonary edema. It is most appropriate that he be admitted and stabilized and proceed with cardiac catheterization. He is not volume overloaded as of this time. His atrial fibrillation rate is controlled.    Essential hypertension    PAF (paroxysmal atrial fibrillation)     At this time he has atrial fib with a controlled rate.    Chronic combined systolic and diastolic heart failure, NYHA class 1    He had some shortness of breath this morning but his volume status is stable at this time.     Tobacco abuse   Dyspnea    History of TIA (transient ischemic attack)     He had a recent very brief episode of decreased motion of his left side. MRI was normal. It was felt that he may  have had a slight TIA related to his atrial fib and missing a dose of his anticoagulant. Because of this event his catheterization was briefly postponed. It had been scheduled electively for next week. With his current presentation, I feel that we should proceed with cardiac catheterization now.    Mitral regurgitation     There is description of mitral regurgitation on his last echo. There was question of a ruptured chordae. There was no clear-cut flail, but this issue needs further evaluation. He will have a right and left heart cath to fully assess his coronaries and LV function and severity of MR and pulmonary pressures.    History of anticoagulation      He was on eliquis with his last dose yesterday. He has not received any today. I have ordered IV heparin for him at this time. After his complete evaluation, his oral anticoagulant should be restarted.  I've carefully outline the events in the first paragraph of this note. Plans were being made for right and left heart catheterization recently. There was then question of a slight TIA. The catheterization date was moved forward. He presents now with an episode of shortness of breath, with a history of having had pulmonary edema recently. It is most appropriate to  proceed with his cardiac catheterization this admission.  Cardiac catheterization orders have been entered. The cath lab is aware that this study should be done in the afternoon tomorrow.    Daryel November, MD 03/02/2015, 1:10 PM

## 2015-03-02 NOTE — ED Notes (Signed)
MD at bedside. Cards 

## 2015-03-02 NOTE — Progress Notes (Signed)
ANTICOAGULATION CONSULT NOTE - Initial Consult  Pharmacy Consult for Heparin Indication: atrial fibrillation  Allergies  Allergen Reactions  . Iohexol      Code: RASH, Desc: PAITENT STATED THAT HE BEGAN ITCHING TOWARDS END OF INJECTIN OF IV CONTRAST-- 13 HR PREP RECOMMENDED/MMS   . Lisinopril Cough    Patient Measurements: Height: 6\' 2"  (188 cm) Weight: 209 lb 11.2 oz (95.119 kg) IBW/kg (Calculated) : 82.2 Heparin Dosing Weight: actual weight  Vital Signs: Temp: 97.6 F (36.4 C) (03/21 1228) Temp Source: Oral (03/21 1228) BP: 168/86 mmHg (03/21 1228) Pulse Rate: 61 (03/21 1228)  Labs:  Recent Labs  03/02/15 0725  HGB 15.7  HCT 44.9  PLT 136*  CREATININE 1.30    Estimated Creatinine Clearance: 59.7 mL/min (by C-G formula based on Cr of 1.3).   Medical History: Past Medical History  Diagnosis Date  . Hypertension   . Hyperlipidemia   . Gout   . CAD (coronary artery disease)     cath 2010  . AAA (abdominal aortic aneurysm)     stent graft put in 02/2009  . Arthritis   . Paroxysmal atrial fibrillation   . Tobacco abuse     Medications:  Prescriptions prior to admission  Medication Sig Dispense Refill Last Dose  . apixaban (ELIQUIS) 5 MG TABS tablet Take 1 tablet (5 mg total) by mouth 2 (two) times daily. 60 tablet 6 03/01/2015 at 2100  . aspirin 81 MG tablet Take 81 mg by mouth daily.   03/02/2015 at Unknown time  . carvedilol (COREG) 6.25 MG tablet Take 1 tablet by mouth 2 (two) times daily.   03/01/2015 at 2100  . CRESTOR 40 MG tablet Take 40 mg by mouth daily.  4 03/01/2015 at 1800  . Dextromethorphan-Guaifenesin 10-200 MG CAPS Take 1 capsule by mouth every 6 (six) hours as needed (for cold).    03/01/2015 at am  . fish oil-omega-3 fatty acids 1000 MG capsule Take 1 g by mouth daily.    03/01/2015 at Unknown time  . furosemide (LASIX) 20 MG tablet Take 1 tablet (20 mg total) by mouth daily. 30 tablet 0 03/01/2015 at Unknown time  . isosorbide mononitrate  (IMDUR) 30 MG 24 hr tablet Take 1 tablet (30 mg total) by mouth daily. 30 tablet 0 03/01/2015 at Unknown time  . Multiple Vitamin (MULTIVITAMIN) capsule Take 1 capsule by mouth daily.   03/01/2015 at Unknown time  . nicotine (NICODERM CQ - DOSED IN MG/24 HOURS) 14 mg/24hr patch Place 1 patch (14 mg total) onto the skin daily. 28 patch 0 03/02/2015 at Unknown time  . potassium chloride (K-DUR) 10 MEQ tablet Take 1 tablet (10 mEq total) by mouth daily. 30 tablet 0 03/01/2015 at Unknown time  . valsartan (DIOVAN) 160 MG tablet Take 1 tablet (160 mg total) by mouth daily. 30 tablet 5 03/01/2015 at Unknown time   Scheduled:  . [START ON 03/03/2015] aspirin EC  81 mg Oral Daily  . carvedilol  6.25 mg Oral BID  . furosemide  20 mg Oral Daily  . irbesartan  150 mg Oral Daily  . isosorbide mononitrate  30 mg Oral Daily  . nicotine  14 mg Transdermal Daily  . omega-3 acid ethyl esters  1 g Oral Daily  . potassium chloride  10 mEq Oral Daily  . rosuvastatin  40 mg Oral q1800   Infusions:   PRN: acetaminophen, gi cocktail, labetalol, morphine injection, nitroGLYCERIN, ondansetron (ZOFRAN) IV  Assessment: 64 yoM admitted 3/21 with  SOB.  He has a recent admission 3/11-3/12 with TIA symptoms and was scheduled to have cardiac cath on 3/17, but was postponed until 3/28.  PMH also includes Afib on chronic apixaban anticoagulation (last dose 3/20 at 2100).  He now has plans for cardiac cath tomorrow, 3/22.  Pharmacy has been consulted to dose Heparin IV while apixaban is held.  Today, 03/02/2015:   APTT, INR : pending collection  CBC: Hgb 15.7, Plt 136  Troponin: 0.01  SCr 1.3 with CrCl ~ 60 ml/min  Note that due to recent apixaban use, Heparin levels will be unreliable and APTT should be used to monitor heparin.   Goal of Therapy:  Heparin level 0.3-0.7 units/ml aPTT 66-102 seconds Monitor platelets by anticoagulation protocol: Yes   Plan:   Baseline PTT, PT/INR  No heparin bolus due to recent  apixaban use  Start heparin IV infusion at 1350 units/hr (13.5 ml/hr)  APTT and Heparin level 8 hours after starting  Daily heparin level and CBC  Follow up plans for cardiac cath and resuming long-term oral anticoagulant.  Gretta Arab PharmD, BCPS Pager [phone] 03/02/2015 1:38 PM

## 2015-03-03 ENCOUNTER — Other Ambulatory Visit: Payer: Self-pay

## 2015-03-03 ENCOUNTER — Encounter (HOSPITAL_COMMUNITY): Payer: Self-pay | Admitting: Cardiovascular Disease

## 2015-03-03 ENCOUNTER — Encounter (HOSPITAL_COMMUNITY)
Admission: EM | Disposition: A | Discharge status: Home / Self Care | Payer: Self-pay | Source: Home / Self Care | Attending: Internal Medicine

## 2015-03-03 DIAGNOSIS — I5042 Chronic combined systolic (congestive) and diastolic (congestive) heart failure: Secondary | ICD-10-CM | POA: Diagnosis not present

## 2015-03-03 DIAGNOSIS — I2510 Atherosclerotic heart disease of native coronary artery without angina pectoris: Secondary | ICD-10-CM | POA: Diagnosis not present

## 2015-03-03 DIAGNOSIS — I4891 Unspecified atrial fibrillation: Secondary | ICD-10-CM | POA: Diagnosis not present

## 2015-03-03 DIAGNOSIS — I209 Angina pectoris, unspecified: Secondary | ICD-10-CM

## 2015-03-03 DIAGNOSIS — I719 Aortic aneurysm of unspecified site, without rupture: Secondary | ICD-10-CM | POA: Diagnosis not present

## 2015-03-03 DIAGNOSIS — R0600 Dyspnea, unspecified: Secondary | ICD-10-CM | POA: Diagnosis not present

## 2015-03-03 DIAGNOSIS — R0789 Other chest pain: Secondary | ICD-10-CM | POA: Diagnosis not present

## 2015-03-03 DIAGNOSIS — I509 Heart failure, unspecified: Secondary | ICD-10-CM | POA: Diagnosis not present

## 2015-03-03 HISTORY — PX: LEFT AND RIGHT HEART CATHETERIZATION WITH CORONARY ANGIOGRAM: SHX5449

## 2015-03-03 LAB — CBC
HEMATOCRIT: 44 % (ref 39.0–52.0)
Hemoglobin: 15 g/dL (ref 13.0–17.0)
MCH: 31.7 pg (ref 26.0–34.0)
MCHC: 34.1 g/dL (ref 30.0–36.0)
MCV: 93 fL (ref 78.0–100.0)
PLATELETS: 130 10*3/uL — AB (ref 150–400)
RBC: 4.73 MIL/uL (ref 4.22–5.81)
RDW: 13.5 % (ref 11.5–15.5)
WBC: 4 10*3/uL (ref 4.0–10.5)

## 2015-03-03 LAB — BASIC METABOLIC PANEL
Anion gap: 7 (ref 5–15)
BUN: 15 mg/dL (ref 6–23)
CALCIUM: 9 mg/dL (ref 8.4–10.5)
CHLORIDE: 108 mmol/L (ref 96–112)
CO2: 24 mmol/L (ref 19–32)
CREATININE: 1.28 mg/dL (ref 0.50–1.35)
GFR calc Af Amer: 63 mL/min — ABNORMAL LOW (ref >90)
GFR calc non Af Amer: 54 mL/min — ABNORMAL LOW (ref >90)
GLUCOSE: 121 mg/dL — AB (ref 70–99)
Potassium: 4 mmol/L (ref 3.5–5.1)
Sodium: 139 mmol/L (ref 135–145)

## 2015-03-03 LAB — HEPARIN LEVEL (UNFRACTIONATED)
HEPARIN UNFRACTIONATED: 0.68 [IU]/mL (ref 0.30–0.70)
Heparin Unfractionated: 0.67 IU/mL (ref 0.30–0.70)

## 2015-03-03 LAB — PROTIME-INR
INR: 1.04 (ref 0.00–1.49)
PROTHROMBIN TIME: 13.7 s (ref 11.6–15.2)

## 2015-03-03 LAB — APTT
APTT: 63 s — AB (ref 24–37)
APTT: 76 s — AB (ref 24–37)

## 2015-03-03 SURGERY — LEFT AND RIGHT HEART CATHETERIZATION WITH CORONARY ANGIOGRAM
Anesthesia: LOCAL

## 2015-03-03 MED ORDER — HEPARIN (PORCINE) IN NACL 2-0.9 UNIT/ML-% IJ SOLN
INTRAMUSCULAR | Status: AC
Start: 2015-03-03 — End: 2015-03-03
  Filled 2015-03-03: qty 1000

## 2015-03-03 MED ORDER — HEPARIN (PORCINE) IN NACL 100-0.45 UNIT/ML-% IJ SOLN
1350.0000 [IU]/h | INTRAMUSCULAR | Status: DC
  Administered 2015-03-04: 04:00:00 1350 [IU]/h via INTRAVENOUS
  Filled 2015-03-03 (×2): qty 250

## 2015-03-03 MED ORDER — NITROGLYCERIN IN D5W 200-5 MCG/ML-% IV SOLN
INTRAVENOUS | Status: AC
  Filled 2015-03-03: qty 250

## 2015-03-03 MED ORDER — DIPHENHYDRAMINE HCL 50 MG PO CAPS
50.0000 mg | ORAL_CAPSULE | Freq: Once | ORAL | Status: AC
  Administered 2015-03-03: 50 mg via ORAL
  Filled 2015-03-03: qty 1

## 2015-03-03 MED ORDER — MIDAZOLAM HCL 2 MG/2ML IJ SOLN
INTRAMUSCULAR | Status: AC
  Filled 2015-03-03: qty 2

## 2015-03-03 MED ORDER — NITROGLYCERIN IN D5W 200-5 MCG/ML-% IV SOLN
0.0000 ug/min | INTRAVENOUS | Status: DC
  Administered 2015-03-03: 10 ug/min via INTRAVENOUS

## 2015-03-03 MED ORDER — FAMOTIDINE IN NACL 20-0.9 MG/50ML-% IV SOLN
INTRAVENOUS | Status: AC
  Filled 2015-03-03: qty 50

## 2015-03-03 MED ORDER — LIDOCAINE HCL (PF) 1 % IJ SOLN
INTRAMUSCULAR | Status: AC
  Filled 2015-03-03: qty 30

## 2015-03-03 MED ORDER — SODIUM CHLORIDE 0.9 % IV SOLN
INTRAVENOUS | Status: DC
Start: 2015-03-03 — End: 2015-03-05
  Administered 2015-03-03: 23:00:00 via INTRAVENOUS

## 2015-03-03 MED ORDER — ONDANSETRON HCL 4 MG/2ML IJ SOLN: 4.0000 mg | Freq: Four times a day (QID) | INTRAMUSCULAR | Status: DC | PRN

## 2015-03-03 MED ORDER — ASPIRIN EC 81 MG PO TBEC
81.0000 mg | DELAYED_RELEASE_TABLET | Freq: Every day | ORAL | Status: DC
  Administered 2015-03-03: 81 mg via ORAL

## 2015-03-03 MED ORDER — NITROGLYCERIN 1 MG/10 ML FOR IR/CATH LAB
INTRA_ARTERIAL | Status: AC
  Filled 2015-03-03: qty 10

## 2015-03-03 MED ORDER — PREDNISONE 50 MG PO TABS
50.0000 mg | ORAL_TABLET | Freq: Four times a day (QID) | ORAL | Status: AC
  Administered 2015-03-03 (×3): 50 mg via ORAL
  Filled 2015-03-03 (×3): qty 1

## 2015-03-03 MED ORDER — METHYLPREDNISOLONE SODIUM SUCC 125 MG IJ SOLR
INTRAMUSCULAR | Status: AC
  Filled 2015-03-03: qty 2

## 2015-03-03 MED ORDER — FENTANYL CITRATE 0.05 MG/ML IJ SOLN
INTRAMUSCULAR | Status: AC
  Filled 2015-03-03: qty 2

## 2015-03-03 MED ORDER — ACETAMINOPHEN 325 MG PO TABS
650.0000 mg | ORAL_TABLET | ORAL | Status: DC | PRN
Start: 2015-03-03 — End: 2015-03-05

## 2015-03-03 NOTE — Progress Notes (Signed)
[facility] for Heparin Indication: atrial fibrillation  Allergies  Allergen Reactions  . Iohexol      Code: RASH, Desc: PAITENT STATED THAT HE BEGAN ITCHING TOWARDS END OF INJECTIN OF IV CONTRAST-- 13 HR PREP RECOMMENDED/MMS   . Lisinopril Cough    Patient Measurements: Height: 6\' 2"  (188 cm) Weight: 209 lb 1.6 oz (94.847 kg) IBW/kg (Calculated) : 82.2 Heparin Dosing Weight: actual weight  Vital Signs: Temp: 98.3 F (36.8 C) (03/22 0540) Temp Source: Oral (03/22 0540) BP: 114/73 mmHg (03/22 0540) Pulse Rate: 61 (03/22 0540)  Labs:  Recent Labs  03/02/15 0725 03/02/15 1246 03/02/15 1520 03/02/15 1818 03/02/15 2225  HGB 15.7  --   --   --   --   HCT 44.9  --   --   --   --   PLT 136*  --   --   --   --   APTT  --   --   --   --  63*  HEPARINUNFRC  --   --   --   --  0.67  CREATININE 1.30  --   --   --   --   TROPONINI  --  <0.03 <0.03 <0.03  --     Estimated Creatinine Clearance: 59.7 mL/min (by C-G formula based on Cr of 1.3).  Infusions:  . sodium chloride 50 mL/hr at 03/03/15 0530  . heparin 1,350 Units/hr (03/03/15 0531)    Assessment: 7 yoM admitted 3/21 with SOB.  He has a recent admission 3/11-3/12 with TIA symptoms and was scheduled to have cardiac cath on 3/17, but was postponed until 3/28.  PMH also includes Afib on chronic apixaban anticoagulation (last dose 3/20 at 2100).  He now has plans for cardiac cath tomorrow, 3/22.  Pharmacy has been consulted to dose Heparin IV while apixaban is held.  Today, 03/03/2015:   Labs scheduled for 0800, not drawn until almost 1000, resulted ~ 1130.  APTT 76, remains therapeutic  HL 0.68, remains therapeutic  CBC: Hgb 15 is stable, Plt 130 is slightly decreased.  Troponin: 0.01, <0.03, <0.03, <0.03  SCr 1.28 with CrCl ~ 60 ml/min   Goal of Therapy:  Heparin level 0.3-0.7 units/ml aPTT 66-102 seconds Monitor platelets by anticoagulation protocol: Yes   Plan:    Continue heparin IV infusion at 1350 units/hr   Daily heparin level and CBC  Follow up plans for cardiac cath (3/22 at 1330) and long-term oral anticoagulation.  Gretta Arab PharmD, BCPS Pager [phone] 03/03/2015 8:53 AM

## 2015-03-03 NOTE — H&P (View-Only) (Signed)
Patient ID: Rodney [name], male   DOB: May 04, 1941, 74 y.o.   MRN: [id number]    Subjective:  Denies SSCP, palpitations or Dyspnea Discussed cath with wife and patient They don't have an understanding of his MR  Objective:  Filed Vitals:   03/02/15 1843 03/02/15 2107 03/03/15 0150 03/03/15 0540  BP: 132/79 156/78 120/68 114/73  Pulse: 65 61 60 61  Temp: 98.2 F (36.8 C) 98 F (36.7 C) 98.2 F (36.8 C) 98.3 F (36.8 C)  TempSrc: Oral Oral Oral Oral  Resp: 16 16 16 18   Height:      Weight:    209 lb 1.6 oz (94.847 kg)  SpO2: 99% 98% 97% 97%    Intake/Output from previous day:  Intake/Output Summary (Last 24 hours) at 03/03/15 0750 Last data filed at 03/02/15 1700  Gross per 24 hour  Intake    480 ml  Output      0 ml  Net    480 ml    Physical Exam: Affect appropriate Healthy:  appears stated age HEENT: normal Neck supple with no adenopathy JVP normal no bruits no thyromegaly Lungs clear with no wheezing and good diaphragmatic motion Heart:  S1/S2 MR  murmur, no rub, gallop or click PMI normal Abdomen: benighn, BS positve, no tenderness, no AAA no bruit.  No HSM or HJR Distal pulses intact with no bruits No edema Neuro non-focal Skin warm and dry No muscular weakness   Lab Results: Basic Metabolic Panel:  Recent Labs  03/02/15 0725  NA 133*  K 3.6  CL 106  CO2 19  GLUCOSE 126*  BUN 17  CREATININE 1.30  CALCIUM 8.5   CBC:  Recent Labs  03/02/15 0725  WBC 3.5*  HGB 15.7  HCT 44.9  MCV 91.1  PLT 136*   Cardiac Enzymes:  Recent Labs  03/02/15 1246 03/02/15 1520 03/02/15 1818  TROPONINI <0.03 <0.03 <0.03    Imaging: Dg Chest 2 View  03/02/2015   CLINICAL DATA:  Chest tightness and difficulty breathing  EXAM: CHEST  2 VIEW  COMPARISON:  February 20, 2015  FINDINGS: There is no edema or consolidation. The heart size and pulmonary vascularity are normal. No adenopathy. There is degenerative change in the thoracic spine.  IMPRESSION: No edema  or consolidation.   Electronically Signed   By: Lowella Grip III M.D.   On: 03/02/2015 08:07    Cardiac Studies:  ECG:   afib nonspecific ST T wave changes    Telemetry:  afib rate control adequate   Echo:   Medications:   . aspirin  81 mg Oral Pre-Cath  . [START ON 03/04/2015] aspirin EC  81 mg Oral Daily  . carvedilol  6.25 mg Oral BID  . furosemide  20 mg Oral Daily  . irbesartan  150 mg Oral Daily  . isosorbide mononitrate  30 mg Oral Daily  . nicotine  14 mg Transdermal Daily  . omega-3 acid ethyl esters  1 g Oral Daily  . potassium chloride  10 mEq Oral Daily  . rosuvastatin  40 mg Oral q1800  . sodium chloride  3 mL Intravenous Q12H     . sodium chloride 50 mL/hr at 03/03/15 0530  . heparin 1,350 Units/hr (03/03/15 0531)    Assessment/Plan:  CHF:  EF 40-45% by echo with mild to moderate MR and anterior prolapse ? 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   Afib:  On heparin rate control ok consider BMS if intervention needed due to need for anticoagulation and DAT  Jenkins Rouge 03/03/2015, 7:50 AM

## 2015-03-03 NOTE — Progress Notes (Signed)
Patient ID: Rodney [name], male   DOB: Nov 14, 1941, 74 y.o.   MRN: [id number]    Subjective:  Denies SSCP, palpitations or Dyspnea Discussed cath with wife and patient They don't have an understanding of his MR  Objective:  Filed Vitals:   03/02/15 1843 03/02/15 2107 03/03/15 0150 03/03/15 0540  BP: 132/79 156/78 120/68 114/73  Pulse: 65 61 60 61  Temp: 98.2 F (36.8 C) 98 F (36.7 C) 98.2 F (36.8 C) 98.3 F (36.8 C)  TempSrc: Oral Oral Oral Oral  Resp: 16 16 16 18   Height:      Weight:    209 lb 1.6 oz (94.847 kg)  SpO2: 99% 98% 97% 97%    Intake/Output from previous day:  Intake/Output Summary (Last 24 hours) at 03/03/15 0750 Last data filed at 03/02/15 1700  Gross per 24 hour  Intake    480 ml  Output      0 ml  Net    480 ml    Physical Exam: Affect appropriate Healthy:  appears stated age HEENT: normal Neck supple with no adenopathy JVP normal no bruits no thyromegaly Lungs clear with no wheezing and good diaphragmatic motion Heart:  S1/S2 MR  murmur, no rub, gallop or click PMI normal Abdomen: benighn, BS positve, no tenderness, no AAA no bruit.  No HSM or HJR Distal pulses intact with no bruits No edema Neuro non-focal Skin warm and dry No muscular weakness   Lab Results: Basic Metabolic Panel:  Recent Labs  03/02/15 0725  NA 133*  K 3.6  CL 106  CO2 19  GLUCOSE 126*  BUN 17  CREATININE 1.30  CALCIUM 8.5   CBC:  Recent Labs  03/02/15 0725  WBC 3.5*  HGB 15.7  HCT 44.9  MCV 91.1  PLT 136*   Cardiac Enzymes:  Recent Labs  03/02/15 1246 03/02/15 1520 03/02/15 1818  TROPONINI <0.03 <0.03 <0.03    Imaging: Dg Chest 2 View  03/02/2015   CLINICAL DATA:  Chest tightness and difficulty breathing  EXAM: CHEST  2 VIEW  COMPARISON:  February 20, 2015  FINDINGS: There is no edema or consolidation. The heart size and pulmonary vascularity are normal. No adenopathy. There is degenerative change in the thoracic spine.  IMPRESSION: No edema  or consolidation.   Electronically Signed   By: Lowella Grip III M.D.   On: 03/02/2015 08:07    Cardiac Studies:  ECG:   afib nonspecific ST T wave changes    Telemetry:  afib rate control adequate   Echo:   Medications:   . aspirin  81 mg Oral Pre-Cath  . [START ON 03/04/2015] aspirin EC  81 mg Oral Daily  . carvedilol  6.25 mg Oral BID  . furosemide  20 mg Oral Daily  . irbesartan  150 mg Oral Daily  . isosorbide mononitrate  30 mg Oral Daily  . nicotine  14 mg Transdermal Daily  . omega-3 acid ethyl esters  1 g Oral Daily  . potassium chloride  10 mEq Oral Daily  . rosuvastatin  40 mg Oral q1800  . sodium chloride  3 mL Intravenous Q12H     . sodium chloride 50 mL/hr at 03/03/15 0530  . heparin 1,350 Units/hr (03/03/15 0531)    Assessment/Plan:  CHF:  EF 40-45% by echo with mild to moderate MR and anterior prolapse ? 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   Afib:  On heparin rate control ok consider BMS if intervention needed due to need for anticoagulation and DAT  Jenkins Rouge 03/03/2015, 7:50 AM

## 2015-03-03 NOTE — Progress Notes (Signed)
[facility]                                                                              Patient Demographics  Rodney [name], is a 74 y.o. male, DOB - 1941/07/21, ENI:[id number]  Admit date - 03/02/2015   Admitting Physician Kelvin Cellar, MD  Outpatient Primary MD for the patient is ROBERTS, Sharol Given, MD  LOS - 1   Chief Complaint  Patient presents with  . Shortness of Breath       Brief HPI   Briefly, Rodney [name] is a 74 year old male patient with hypertension, this lipidemia, atrial fibrillation, chronic antiemetic medication, nicotine abuse presented to ED on 3/21 with chest pain.He was recently admitted to our service on 02/20/2015 and discharged on 02/21/2015 undergoing TIA workup. During that hospitalization MRI/MRA were negative. Prior to this hospitalization he had an abnormal stress test that was done in the outpatient setting as heart cath has been scheduled for next week. He presented to the emergency department today complaining of chest pain located in the retrosternal area characterized as tight and pressure-like, nonradiating, associated with shortness of breath that started at 5:00 this morning. He reports symptoms lasted for 10-20 minutes. In the emergency room he was evaluated by cardiology, were plans have been made to move up his cath for tomorrow.    Assessment & Plan    Principal Problem:   Chest pain:  Multiple risk factors including hypertension, dyslipidemia, tobacco abuse, outpatient abnormal stress test - BNP 86.3, EKG with no acute ischemic changes, serial cardiac enzymes negative - Cardiology consulted, discussed with Dr. Johnsie Cancel this morning, recommended cardiac cath today. Patient to stay in [facility] per cardiology recommendations after the cardiac cath. Sign-out given to Dr Hartford Poli ([facility]).  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 - Last 2-D echo on 1/16 with EF of 40-45%, with mild to moderate MR and anterior prolapse - continue Lasix, plan for right and left heart cath today    Tobacco abuse - Patient counseled on nicotine cessation, continue nicotine patch  History of TIA - Patient was on eliquis, continue heparin drip for cardiac cath - Continue statin, no new focal neurological deficits   Code Status: Full code   Family Communication: Discussed with patient's wife at bedside  Disposition Plan: Transfer to [facility]  Time Spent in minutes   25 minutes  Procedures  Cardiac cath today  Consults   Cardiology, Dr. Ron Parker, Dr. Johnsie Cancel  DVT Prophylaxis  Heparin drip  Medications  Scheduled Meds: . aspirin  81 mg Oral Pre-Cath  . [START ON 03/04/2015] aspirin EC  81 mg Oral Daily  . carvedilol  6.25 mg Oral BID  . furosemide  20 mg Oral Daily  . irbesartan  150 mg Oral Daily  . isosorbide mononitrate  30 mg Oral Daily  . nicotine  14 mg Transdermal Daily  .  omega-3 acid ethyl esters  1 g Oral Daily  . potassium chloride  10 mEq Oral Daily  . rosuvastatin  40 mg Oral q1800  . sodium chloride  3 mL Intravenous Q12H   Continuous Infusions: . sodium chloride 50 mL/hr at 03/03/15 0530  . heparin 1,350 Units/hr (03/03/15 0531)   PRN Meds:.sodium chloride, acetaminophen, gi cocktail, labetalol, morphine injection, nitroGLYCERIN, ondansetron (ZOFRAN) IV, sodium chloride   Antibiotics    Anti-infectives    None        Subjective:   Rodney [name] was seen and examined today.  Patient denies dizziness, chest pain, shortness of breath, abdominal pain, N/V/D/C, new weakness, numbess, tingling. No acute events overnight    Objective:   Filed Vitals:   03/02/15 1843 03/02/15 2107 03/03/15 0150 03/03/15 0540  BP: 132/79 156/78 120/68 114/73  Pulse: 65 61 60 61  Temp: 98.2 F (36.8 C) 98 F (36.7 C) 98.2 F (36.8 C) 98.3 F (36.8 C)  TempSrc: Oral Oral Oral Oral  Resp: 16 16 16 18   Height:      Weight:    94.847 kg (209 lb 1.6 oz)  SpO2: 99% 98% 97% 97%    Wt Readings from Last 3 Encounters:  03/03/15 94.847 kg (209 lb 1.6 oz)  02/20/15 97.523 kg (215 lb)  02/17/15 97.705 kg (215 lb 6.4 oz)     Intake/Output Summary (Last 24 hours) at 03/03/15 0809 Last data filed at 03/02/15 1700  Gross per 24 hour  Intake    480 ml  Output      0 ml  Net    480 ml    Exam  General: Alert and oriented x 3, NAD  HEENT:  PERRLA, EOMI, Anicteic Sclera, mucous membranes moist.   Neck: Supple, no JVD, no masses  CVS: S1 S2 auscultated, no rubs, murmurs or gallops. Regular rate and rhythm.  Respiratory: Clear to auscultation bilaterally, no wheezing, rales or rhonchi  Abdomen: Soft, nontender, nondistended, + bowel sounds  Ext: no cyanosis clubbing or edema  Neuro: AAOx3, Cr N's II- XII. Strength 5/5 upper and lower extremities bilaterally  Skin: No rashes  Psych: Normal affect and demeanor, alert and oriented x3    Data Review   Micro Results No results found for this or any previous visit (from the past 240 hour(s)).  Radiology Reports Dg Chest 2 View  03/02/2015   CLINICAL DATA:  Chest tightness and difficulty breathing  EXAM: CHEST  2 VIEW  COMPARISON:  February 20, 2015  FINDINGS: There is no edema or consolidation. The heart size and pulmonary vascularity are normal. No adenopathy. There is degenerative change in the thoracic spine.  IMPRESSION: No edema or consolidation.   Electronically Signed   By: Lowella Grip III M.D.   On: 03/02/2015 08:07   Dg Chest 2 View  02/19/2015   CLINICAL DATA:  Preoperative evaluation for cardiac catheterization, known shortness of Breath  EXAM: CHEST   2 VIEW  COMPARISON:  01/03/2015  FINDINGS: Cardiac shadow is within normal limits. The lungs are clear bilaterally. The previously seen bibasilar infiltrates have resolved. No acute bony abnormality is seen. Degenerative changes of the thoracic spine are again noted.  IMPRESSION: No active cardiopulmonary disease.   Electronically Signed   By: Inez Catalina M.D.   On: 02/19/2015 09:21   Ct Head Wo Contrast  02/20/2015   CLINICAL DATA:  Left-sided numbness upon waking.  EXAM: CT HEAD WITHOUT CONTRAST  TECHNIQUE: Contiguous axial images were obtained  from the base of the skull through the vertex without intravenous contrast.  COMPARISON:  Head CT 11/24/2007, brain MRI 11/30/2007  FINDINGS: No intracranial hemorrhage, mass effect, or midline shift. No hydrocephalus. The basilar cisterns are patent. No evidence of territorial infarct. No intracranial fluid collection. Calvarium is intact. Included paranasal sinuses and mastoid air cells are well aerated.  IMPRESSION: No acute intracranial abnormality.   Electronically Signed   By: Jeb Levering M.D.   On: 02/20/2015 06:55   Mr Brain Wo Contrast  02/20/2015   CLINICAL DATA:  Heaviness and tingling in the left upper and lower extremity. Symptoms lasted approximately 30 minutes and then resolved. Patient is scheduled for cardiac catheterization next week.  EXAM: MRI HEAD WITHOUT CONTRAST  MRA HEAD WITHOUT CONTRAST  TECHNIQUE: Multiplanar, multiecho pulse sequences of the brain and surrounding structures were obtained without intravenous contrast. Angiographic images of the head were obtained using MRA technique without contrast.  COMPARISON:  CT head without contrast from the same day. MRI of the brain 11/30/2007.  FINDINGS: MRI HEAD FINDINGS  The diffusion-weighted images demonstrate no evidence for acute or subacute infarct.  No acute hemorrhage or mass lesion is present. The ventricles are of normal size. Mild periventricular white matter changes are noted  bilaterally. No significant extraaxial fluid collection is present.  Flow is present in the major intracranial arteries. The globes and orbits are within normal limits. Polyps and mucosal thickening are noted along the floor of the maxillary sinuses bilaterally. There is mild mucosal thickening in the anterior ethmoid air cells and inferior frontal sinuses. The sphenoid sinuses are clear. Minimal fluid is present in the left mastoid air cells. No obstructing nasopharyngeal lesion is evident.  MRA HEAD FINDINGS  Internal carotid arteries are within normal limits from the high cervical segments through the ICA termini bilaterally. The A1 and M1 segments are normal. Anterior communicating artery is not visualized. The MCA bifurcations are intact. There is moderate attenuation of distal MCA branch vessels without proximal stenosis or occlusion.  The vertebral arteries are codominant. The right PICA is visualized and normal. The left AICA is dominant. The basilar artery is within normal limits.  The right posterior cerebral artery originates from the basilar tip. The left posterior cerebral artery is fed by P1 segment and posterior communicating artery. There is moderate attenuation of distal PCA branch vessels without significant proximal stenosis or occlusion.  IMPRESSION: 1. No acute intracranial abnormality. 2. Mild periventricular white matter changes are slightly advanced for age. This likely reflects the sequela of chronic microvascular ischemia. 3. Moderate distal small vessel attenuation in both the anterior and posterior circulation likely reflects atherosclerotic and hypertensive. 4. No significant proximal stenosis, aneurysm, or branch vessel occlusion.   Electronically Signed   By: San Morelle M.D.   On: 02/20/2015 18:37   Dg Chest Port 1 View  02/20/2015   CLINICAL DATA:  Awoke this morning with numbness in left arm and left leg.  EXAM: PORTABLE CHEST - 1 VIEW  COMPARISON:  Frontal and lateral  views 02/19/2015  FINDINGS: The cardiomediastinal contours are unchanged allowing for differences in technique. Pulmonary vasculature is normal. No consolidation, pleural effusion, or pneumothorax. No acute osseous abnormalities are seen. There is degenerative change throughout the spine.  IMPRESSION: No acute pulmonary process.   Electronically Signed   By: Jeb Levering M.D.   On: 02/20/2015 06:50   Mr Jodene Nam Head/brain Wo Cm  02/20/2015   CLINICAL DATA:  Heaviness and tingling in the left  upper and lower extremity. Symptoms lasted approximately 30 minutes and then resolved. Patient is scheduled for cardiac catheterization next week.  EXAM: MRI HEAD WITHOUT CONTRAST  MRA HEAD WITHOUT CONTRAST  TECHNIQUE: Multiplanar, multiecho pulse sequences of the brain and surrounding structures were obtained without intravenous contrast. Angiographic images of the head were obtained using MRA technique without contrast.  COMPARISON:  CT head without contrast from the same day. MRI of the brain 11/30/2007.  FINDINGS: MRI HEAD FINDINGS  The diffusion-weighted images demonstrate no evidence for acute or subacute infarct.  No acute hemorrhage or mass lesion is present. The ventricles are of normal size. Mild periventricular white matter changes are noted bilaterally. No significant extraaxial fluid collection is present.  Flow is present in the major intracranial arteries. The globes and orbits are within normal limits. Polyps and mucosal thickening are noted along the floor of the maxillary sinuses bilaterally. There is mild mucosal thickening in the anterior ethmoid air cells and inferior frontal sinuses. The sphenoid sinuses are clear. Minimal fluid is present in the left mastoid air cells. No obstructing nasopharyngeal lesion is evident.  MRA HEAD FINDINGS  Internal carotid arteries are within normal limits from the high cervical segments through the ICA termini bilaterally. The A1 and M1 segments are normal. Anterior  communicating artery is not visualized. The MCA bifurcations are intact. There is moderate attenuation of distal MCA branch vessels without proximal stenosis or occlusion.  The vertebral arteries are codominant. The right PICA is visualized and normal. The left AICA is dominant. The basilar artery is within normal limits.  The right posterior cerebral artery originates from the basilar tip. The left posterior cerebral artery is fed by P1 segment and posterior communicating artery. There is moderate attenuation of distal PCA branch vessels without significant proximal stenosis or occlusion.  IMPRESSION: 1. No acute intracranial abnormality. 2. Mild periventricular white matter changes are slightly advanced for age. This likely reflects the sequela of chronic microvascular ischemia. 3. Moderate distal small vessel attenuation in both the anterior and posterior circulation likely reflects atherosclerotic and hypertensive. 4. No significant proximal stenosis, aneurysm, or branch vessel occlusion.   Electronically Signed   By: San Morelle M.D.   On: 02/20/2015 18:37    CBC  Recent Labs Lab 03/02/15 0725  WBC 3.5*  HGB 15.7  HCT 44.9  PLT 136*  MCV 91.1  MCH 31.8  MCHC 35.0  RDW 13.1    Chemistries   Recent Labs Lab 03/02/15 0725  NA 133*  K 3.6  CL 106  CO2 19  GLUCOSE 126*  BUN 17  CREATININE 1.30  CALCIUM 8.5   ------------------------------------------------------------------------------------------------------------------ estimated creatinine clearance is 59.7 mL/min (by C-G formula based on Cr of 1.3). ------------------------------------------------------------------------------------------------------------------ No results for input(s): HGBA1C in the last 72 hours. ------------------------------------------------------------------------------------------------------------------ No results for input(s): CHOL, HDL, LDLCALC, TRIG, CHOLHDL, LDLDIRECT in the last 72  hours. ------------------------------------------------------------------------------------------------------------------ No results for input(s): TSH, T4TOTAL, T3FREE, THYROIDAB in the last 72 hours.  Invalid input(s): FREET3 ------------------------------------------------------------------------------------------------------------------ No results for input(s): VITAMINB12, FOLATE, FERRITIN, TIBC, IRON, RETICCTPCT in the last 72 hours.  Coagulation profile No results for input(s): INR, PROTIME in the last 168 hours.  No results for input(s): DDIMER in the last 72 hours.  Cardiac Enzymes  Recent Labs Lab 03/02/15 1246 03/02/15 1520 03/02/15 1818  TROPONINI <0.03 <0.03 <0.03   ------------------------------------------------------------------------------------------------------------------ Invalid input(s): POCBNP  No results for input(s): GLUCAP in the last 72 hours.  Jayvan Mcshan MD.  03/03/2015 at 8:09 AM  [facility]  Between 7am to 7pm - Pager - [phone]  After 7pm go to [email or web address] - password [email or web address]  Call night coverage person covering after 7pm

## 2015-03-03 NOTE — Interval H&P Note (Signed)
Cath Lab Visit (complete for each Cath Lab visit)  Clinical Evaluation Leading to the Procedure:   ACS: No.  Non-ACS:    Anginal Classification: CCS IV  Anti-ischemic medical therapy: Maximal Therapy (2 or more classes of medications)  Non-Invasive Test Results: No non-invasive testing performed  Prior CABG: No previous CABG      History and Physical Interval Note:  03/03/2015 6:40 PM  Rodney [name]  has presented today for surgery, with the diagnosis of shortness of breath  The various methods of treatment have been discussed with the patient and family. After consideration of risks, benefits and other options for treatment, the patient has consented to  Procedure(s): LEFT AND RIGHT HEART CATHETERIZATION WITH CORONARY ANGIOGRAM (N/A) as a surgical intervention .  The patient's history has been reviewed, patient examined, no change in status, stable for surgery.  I have reviewed the patient's chart and labs.  Questions were answered to the patient's satisfaction.     Monte Zinni A

## 2015-03-03 NOTE — CV Procedure (Addendum)
Rodney [name] is a 74 y.o. male   [id number]  [id number] LOCATION:  FACILITY: [facility]  PHYSICIAN: Troy Sine, MD, [facility] [birth year]   DATE OF PROCEDURE:  03/03/2015      RIGHT AND LEFT HEART CARDIAC CATHETERIZATION   HISTORY:   Rodney [name] is a 74 y.o. male who has a history of hypertension, hyperlipidemia, prior coronary artery disease by catheterization in 2010, abdominal aortic aneurysm status post abdominal stent graft in March 2010, a history of tobacco use.  He developed recent paroxysmal atrial fibrillation and has been on anticoagulation therapy for approximately one month. There is a history of possible TIA.  He was admitted with increasing episodes of shortness of breath.  He is referred for right and left heart cardiac catheterization.   PROCEDURE:   The patient was brought to the second floor [facility] cath lab in the postabsorptive state. Versed 2 mg and fentanyl 50 mcg were administered for conscious sedation. The right groin was prepped and draped in sterile fashion and a 5 [place] arterial sheath and 7 French venous sheath were inserted without difficulty. A Swan-Ganz catheter was advanced into the venous sheath and pressures were obtained in the right atrium, right ventricle, pulmonary artery, and pulmonary capillary wedge position. Cardiac outputs were obtained by the thermodilution and assumed Fick methods. Oxygen saturation was obtained in the pulmonary artery and aorta. A pigtail catheter was inserted and simultaneous AO/PA pressures were recorded. The pigtail catheter was advanced into the left ventricle and simultaneous left ventricular and PCW pressures were recorded. Left ventriculography was performed in the RAO projection.  A left ventricle to aorta pullback was performed. The pigtail catheter was then removed and diagnostic catheterization to delineate the coronary anatomy was performed utilizing 5 French Judkins 4 left and right diagnostic catheters.  All catheters were removed and the patient. Hemostasis was obtained by direct manual pressure. The patient tolerated the procedure well and returned to his room in satisfactory condition.   HEMODYNAMICS:   RA: 6 RV: 30/5 PA: 30/11 PC: 10  AO: 170/80 PA: 30/11  LV: 177/15 PC: 12  LV: 180/17 AO: 180/82   Oxygen saturation in the aorta 97% and the pulmonary artery 76%  Cardiac output: 6.6 l/min (Thermo); 6.9 (Fick)  Cardiac index: 3.0 l/m/m2                3.1  ANGIOGRAPHY:   Left main: Large caliber vessel that had mild irregularity with 10% narrowing distally prior to trifurcating into LAD, ramus intermediate, like vessel, and the left circumflex coronary artery.  LAD: Large caliber vessel with mild luminal irregularities proximally with narrowing up to 30% to 40%.  There was mild proximal calcification.  Ramus intermediate/High OM: Moderate size vessel proximally that had 95% stenosis in the proximal third.  There was another region of mid 90% stenosis.   Left circumflex: Moderate size dominant vessel that had 30% proximal stenosis after the ramus high OM vessel.  Distally, the vessel supplied the PDA and PLA like vessel.  There was 80% eccentric stenosis in the distal branch.  The smaller distal superior branch had 70% stenosis branch   Right coronary artery: Moderate size vessel that had luminal irregularities in its midsegment.  There was 20% narrowing proximal to the acute margin and 40% narrowing in the region of the small PDA takeoff.  Left ventriculography  revealed mild RV dilation.  There was global hypocontractility with an ejection fraction of 35-40%.  There did not appear  to be significant angiographic mitral regurgitation.  Distal aortography was performed which revealed a widely patent aortoiliac stent graft which commenced just below the renal arteries and extended to the iliacs bilaterally.  There was no evidence for graft stenosis or apparent  endoleak.  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  Multivessel coronary obstructive disease with luminal irregularity and proximal LAD narrowings of 30-40%; ramus intermediate/high OM, proximal and mid stenoses of 95 and 90%; 30%  narrowing in the proximal AV groove circumflex with distal 80% stenosis in the larger distal branch with 70% narrowing in the smaller branch; and luminal irregularity of the RCA with 20-40% mid and distal stenoses.  Widely patent abdominal aortic iliac stent graft.   RECOMMENDATION:  The patient will be hydrated overnight.  He was started on IV nitroglycerin in light of his elevated blood pressure.  His ARB therapy will be held.  Plan will be for PCI to the ramus intermediate/high OM vessel and possible distal circumflex tomorrow.  The patient has a history of remote TIA and recent PAF and had been on anticoagulation therapy.  Because of this bare-metal stents and clopidogrel will be utilized for the procedure.      Troy Sine, MD, [facility] 03/03/2015 7:53 PM

## 2015-03-03 NOTE — Progress Notes (Signed)
ANTICOAGULATION CONSULT NOTE - Follow Up Consult  Pharmacy Consult for Heparin Indication: atrial fibrillation  Allergies  Allergen Reactions  . Iohexol      Code: RASH, Desc: PAITENT STATED THAT HE BEGAN ITCHING TOWARDS END OF INJECTIN OF IV CONTRAST-- 13 HR PREP RECOMMENDED/MMS   . Lisinopril Cough    Patient Measurements: Height: 6\' 2"  (188 cm) Weight: 209 lb 11.2 oz (95.119 kg) IBW/kg (Calculated) : 82.2 Heparin Dosing Weight:   Vital Signs: Temp: 98.2 F (36.8 C) (03/22 0150) Temp Source: Oral (03/22 0150) BP: 120/68 mmHg (03/22 0150) Pulse Rate: 60 (03/22 0150)  Labs:  Recent Labs  03/02/15 0725 03/02/15 1246 03/02/15 1520 03/02/15 1818 03/02/15 2225  HGB 15.7  --   --   --   --   HCT 44.9  --   --   --   --   PLT 136*  --   --   --   --   APTT  --   --   --   --  63*  HEPARINUNFRC  --   --   --   --  0.67  CREATININE 1.30  --   --   --   --   TROPONINI  --  <0.03 <0.03 <0.03  --     Estimated Creatinine Clearance: 59.7 mL/min (by C-G formula based on Cr of 1.3).   Medications:  Infusions:  . sodium chloride    . heparin 1,350 Units/hr (03/02/15 1433)    Assessment: Patient with heparin level at goal.  No heparin issues noted.  Goal of Therapy:  Heparin level 0.3-0.7 units/ml Monitor platelets by anticoagulation protocol: Yes   Plan:  Continue heparin drip at current rate Recheck level at 0800  Tyler Deis, Shea Stakes Crowford 03/03/2015,1:58 AM

## 2015-03-03 NOTE — Progress Notes (Signed)
PHARMACY NOTE  Pharmacy Consult :  74 y.o. male is to be restarted on Heparin infusion 8 hours after sheath pulled in preparation for PCI Wednesday, 03/04/15.   Dosing Wt :  95 kg  Hematology :  Recent Labs  03/02/15 0725 03/02/15 2225 03/03/15 0940  HGB 15.7  --  15.0  HCT 44.9  --  44.0  PLT 136*  --  130*  APTT  --  63* 76*  LABPROT  --   --  13.7  INR  --   --  1.04  HEPARINUNFRC  --  0.67 0.68  CREATININE 1.30  --  1.28    Current Medication[s] Include: Scheduled:  Scheduled:  . [START ON 03/04/2015] aspirin EC  81 mg Oral Daily  . aspirin EC  81 mg Oral Daily  . carvedilol  6.25 mg Oral BID  . furosemide  20 mg Oral Daily  . isosorbide mononitrate  30 mg Oral Daily  . nicotine  14 mg Transdermal Daily  . omega-3 acid ethyl esters  1 g Oral Daily  . potassium chloride  10 mEq Oral Daily  . predniSONE  50 mg Oral Q6H  . rosuvastatin  40 mg Oral q1800   Infusion[s]: Infusions:  . sodium chloride 50 mL/hr at 03/03/15 0530  . sodium chloride    . nitroGLYCERIN     Assessment :  S/P Right and Left Heart cath in which the patient noted to have multivessel CAD and dilated cardiomyopathy with an EF of 35 - 40 %.  A PCI is scheduled for Wednesday.  Heparin infusion is the be restarted 8 hours after sheath pulled following the cath today.  Previous Heparin levels within the therapeutic window at a rate of 1350 units/hr.  Goal :  Heparin Level  0.3 - 0.7 units/ml  Plan : 1. Heparin will be restarted, no bolus, at 1350 units/hr 8 hours after sheath pulled.   The next Heparin Level will be drawn in 8 hours unless PCI performed before then. 2. Follow-up after PCI.  Marthenia Rolling, Pharm.D. 03/03/2015  8:51 PM

## 2015-03-03 NOTE — Progress Notes (Signed)
UR completed 

## 2015-03-03 NOTE — Progress Notes (Signed)
  Echocardiogram 2D Echocardiogram has been performed.  Bobbye Charleston 03/03/2015, 9:12 AM

## 2015-03-04 ENCOUNTER — Encounter (HOSPITAL_COMMUNITY)
Admission: EM | Disposition: A | Discharge status: Home / Self Care | Payer: Medicare Other | Source: Home / Self Care | Attending: Internal Medicine

## 2015-03-04 ENCOUNTER — Encounter (HOSPITAL_COMMUNITY): Payer: Self-pay | Admitting: Cardiovascular Disease

## 2015-03-04 DIAGNOSIS — I2510 Atherosclerotic heart disease of native coronary artery without angina pectoris: Secondary | ICD-10-CM | POA: Diagnosis present

## 2015-03-04 DIAGNOSIS — I2583 Coronary atherosclerosis due to lipid rich plaque: Secondary | ICD-10-CM | POA: Diagnosis present

## 2015-03-04 DIAGNOSIS — Z8679 Personal history of other diseases of the circulatory system: Secondary | ICD-10-CM | POA: Diagnosis not present

## 2015-03-04 DIAGNOSIS — D696 Thrombocytopenia, unspecified: Secondary | ICD-10-CM | POA: Diagnosis present

## 2015-03-04 DIAGNOSIS — F17210 Nicotine dependence, cigarettes, uncomplicated: Secondary | ICD-10-CM | POA: Diagnosis present

## 2015-03-04 DIAGNOSIS — I10 Essential (primary) hypertension: Secondary | ICD-10-CM | POA: Diagnosis not present

## 2015-03-04 DIAGNOSIS — I480 Paroxysmal atrial fibrillation: Secondary | ICD-10-CM | POA: Diagnosis not present

## 2015-03-04 DIAGNOSIS — I340 Nonrheumatic mitral (valve) insufficiency: Secondary | ICD-10-CM | POA: Diagnosis present

## 2015-03-04 DIAGNOSIS — K30 Functional dyspepsia: Secondary | ICD-10-CM | POA: Diagnosis not present

## 2015-03-04 DIAGNOSIS — I5042 Chronic combined systolic (congestive) and diastolic (congestive) heart failure: Secondary | ICD-10-CM | POA: Diagnosis present

## 2015-03-04 DIAGNOSIS — I2584 Coronary atherosclerosis due to calcified coronary lesion: Secondary | ICD-10-CM | POA: Diagnosis present

## 2015-03-04 DIAGNOSIS — I420 Dilated cardiomyopathy: Secondary | ICD-10-CM | POA: Diagnosis present

## 2015-03-04 DIAGNOSIS — R0789 Other chest pain: Secondary | ICD-10-CM | POA: Diagnosis present

## 2015-03-04 DIAGNOSIS — Z79899 Other long term (current) drug therapy: Secondary | ICD-10-CM | POA: Diagnosis not present

## 2015-03-04 DIAGNOSIS — Z7901 Long term (current) use of anticoagulants: Secondary | ICD-10-CM | POA: Diagnosis not present

## 2015-03-04 DIAGNOSIS — Z8673 Personal history of transient ischemic attack (TIA), and cerebral infarction without residual deficits: Secondary | ICD-10-CM | POA: Diagnosis not present

## 2015-03-04 DIAGNOSIS — Z95828 Presence of other vascular implants and grafts: Secondary | ICD-10-CM | POA: Diagnosis not present

## 2015-03-04 DIAGNOSIS — I200 Unstable angina: Secondary | ICD-10-CM | POA: Diagnosis not present

## 2015-03-04 DIAGNOSIS — Z888 Allergy status to other drugs, medicaments and biological substances status: Secondary | ICD-10-CM | POA: Diagnosis not present

## 2015-03-04 DIAGNOSIS — Z7982 Long term (current) use of aspirin: Secondary | ICD-10-CM | POA: Diagnosis not present

## 2015-03-04 DIAGNOSIS — Z8249 Family history of ischemic heart disease and other diseases of the circulatory system: Secondary | ICD-10-CM | POA: Diagnosis not present

## 2015-03-04 DIAGNOSIS — I25118 Atherosclerotic heart disease of native coronary artery with other forms of angina pectoris: Secondary | ICD-10-CM | POA: Diagnosis present

## 2015-03-04 DIAGNOSIS — I719 Aortic aneurysm of unspecified site, without rupture: Secondary | ICD-10-CM | POA: Diagnosis not present

## 2015-03-04 DIAGNOSIS — Z91041 Radiographic dye allergy status: Secondary | ICD-10-CM | POA: Diagnosis not present

## 2015-03-04 DIAGNOSIS — E785 Hyperlipidemia, unspecified: Secondary | ICD-10-CM | POA: Diagnosis present

## 2015-03-04 DIAGNOSIS — I25110 Atherosclerotic heart disease of native coronary artery with unstable angina pectoris: Secondary | ICD-10-CM | POA: Diagnosis not present

## 2015-03-04 HISTORY — PX: PERCUTANEOUS CORONARY STENT INTERVENTION (PCI-S): SHX5485

## 2015-03-04 LAB — POCT I-STAT 3, VENOUS BLOOD GAS (G3P V)
Acid-base deficit: 3 mmol/L — ABNORMAL HIGH (ref 0.0–2.0)
Bicarbonate: 20.7 mEq/L (ref 20.0–24.0)
O2 Saturation: 76 %
PCO2 VEN: 33.6 mmHg — AB (ref 45.0–50.0)
PO2 VEN: 41 mmHg (ref 30.0–45.0)
TCO2: 22 mmol/L (ref 0–100)
pH, Ven: 7.396 — ABNORMAL HIGH (ref 7.250–7.300)

## 2015-03-04 LAB — CBC
HEMATOCRIT: 39.8 % (ref 39.0–52.0)
Hemoglobin: 13.8 g/dL (ref 13.0–17.0)
MCH: 31.5 pg (ref 26.0–34.0)
MCHC: 34.7 g/dL (ref 30.0–36.0)
MCV: 90.9 fL (ref 78.0–100.0)
PLATELETS: 137 10*3/uL — AB (ref 150–400)
RBC: 4.38 MIL/uL (ref 4.22–5.81)
RDW: 13.2 % (ref 11.5–15.5)
WBC: 13.7 10*3/uL — ABNORMAL HIGH (ref 4.0–10.5)

## 2015-03-04 LAB — BASIC METABOLIC PANEL
ANION GAP: 3 — AB (ref 5–15)
BUN: 17 mg/dL (ref 6–23)
CO2: 24 mmol/L (ref 19–32)
Calcium: 8.7 mg/dL (ref 8.4–10.5)
Chloride: 111 mmol/L (ref 96–112)
Creatinine, Ser: 1.32 mg/dL (ref 0.50–1.35)
GFR calc Af Amer: 61 mL/min — ABNORMAL LOW (ref >90)
GFR, EST NON AFRICAN AMERICAN: 52 mL/min — AB (ref >90)
Glucose, Bld: 204 mg/dL — ABNORMAL HIGH (ref 70–99)
POTASSIUM: 4 mmol/L (ref 3.5–5.1)
SODIUM: 138 mmol/L (ref 135–145)

## 2015-03-04 LAB — POCT I-STAT 3, ART BLOOD GAS (G3+)
ACID-BASE DEFICIT: 5 mmol/L — AB (ref 0.0–2.0)
Bicarbonate: 19.1 mEq/L — ABNORMAL LOW (ref 20.0–24.0)
O2 Saturation: 97 %
PH ART: 7.371 (ref 7.350–7.450)
TCO2: 20 mmol/L (ref 0–100)
pCO2 arterial: 33 mmHg — ABNORMAL LOW (ref 35.0–45.0)
pO2, Arterial: 88 mmHg (ref 80.0–100.0)

## 2015-03-04 LAB — POCT ACTIVATED CLOTTING TIME
Activated Clotting Time: 91 seconds
Activated Clotting Time: 497 seconds

## 2015-03-04 SURGERY — PERCUTANEOUS CORONARY STENT INTERVENTION (PCI-S)
Anesthesia: LOCAL

## 2015-03-04 MED ORDER — NITROGLYCERIN 1 MG/10 ML FOR IR/CATH LAB
INTRA_ARTERIAL | Status: AC
  Filled 2015-03-04: qty 10

## 2015-03-04 MED ORDER — ONDANSETRON HCL 4 MG/2ML IJ SOLN: 4.0000 mg | Freq: Four times a day (QID) | INTRAMUSCULAR | Status: DC | PRN

## 2015-03-04 MED ORDER — SALINE SPRAY 0.65 % NA SOLN
1.0000 | NASAL | Status: DC | PRN
  Filled 2015-03-04: qty 44

## 2015-03-04 MED ORDER — ATROPINE SULFATE 0.1 MG/ML IJ SOLN
INTRAMUSCULAR | Status: AC
  Filled 2015-03-04: qty 10

## 2015-03-04 MED ORDER — FENTANYL CITRATE 0.05 MG/ML IJ SOLN
INTRAMUSCULAR | Status: AC
  Filled 2015-03-04: qty 2

## 2015-03-04 MED ORDER — NITROGLYCERIN IN D5W 200-5 MCG/ML-% IV SOLN
0.0000 ug/min | INTRAVENOUS | Status: DC
  Administered 2015-03-04: 40 ug/min via INTRAVENOUS

## 2015-03-04 MED ORDER — SODIUM CHLORIDE 0.9 % IV SOLN
INTRAVENOUS | Status: DC
  Administered 2015-03-04 (×2): via INTRAVENOUS

## 2015-03-04 MED ORDER — FAMOTIDINE IN NACL 20-0.9 MG/50ML-% IV SOLN
20.0000 mg | INTRAVENOUS | Status: AC
  Administered 2015-03-04: 08:00:00 20 mg via INTRAVENOUS
  Filled 2015-03-04: qty 50

## 2015-03-04 MED ORDER — ASPIRIN 81 MG PO CHEW
81.0000 mg | CHEWABLE_TABLET | ORAL | Status: AC
  Administered 2015-03-04: 81 mg via ORAL
  Filled 2015-03-04: qty 1

## 2015-03-04 MED ORDER — SODIUM CHLORIDE 0.9 % IJ SOLN
3.0000 mL | INTRAMUSCULAR | Status: DC | PRN
Start: 2015-03-04 — End: 2015-03-04

## 2015-03-04 MED ORDER — ACETAMINOPHEN 325 MG PO TABS: 650.0000 mg | ORAL_TABLET | ORAL | Status: DC | PRN

## 2015-03-04 MED ORDER — CLOPIDOGREL BISULFATE 75 MG PO TABS
600.0000 mg | ORAL_TABLET | ORAL | Status: AC
  Administered 2015-03-04: 600 mg via ORAL
  Filled 2015-03-04: qty 8

## 2015-03-04 MED ORDER — MIDAZOLAM HCL 2 MG/2ML IJ SOLN
INTRAMUSCULAR | Status: AC
  Filled 2015-03-04: qty 2

## 2015-03-04 MED ORDER — DIPHENHYDRAMINE HCL 50 MG/ML IJ SOLN
25.0000 mg | INTRAMUSCULAR | Status: AC
  Administered 2015-03-04: 25 mg via INTRAVENOUS
  Filled 2015-03-04: qty 1

## 2015-03-04 MED ORDER — SODIUM CHLORIDE 0.9 % IV SOLN: INTRAVENOUS | Status: DC

## 2015-03-04 MED ORDER — ASPIRIN EC 81 MG PO TBEC
81.0000 mg | DELAYED_RELEASE_TABLET | Freq: Every day | ORAL | Status: DC
  Administered 2015-03-05: 09:00:00 81 mg via ORAL
  Filled 2015-03-04: qty 1

## 2015-03-04 MED ORDER — CLOPIDOGREL BISULFATE 75 MG PO TABS
75.0000 mg | ORAL_TABLET | Freq: Every day | ORAL | Status: DC
  Administered 2015-03-05 – 2015-03-06 (×2): 75 mg via ORAL
  Filled 2015-03-04 (×2): qty 1

## 2015-03-04 MED ORDER — SODIUM CHLORIDE 0.9 % IV SOLN: 250.0000 mL | INTRAVENOUS | Status: DC | PRN

## 2015-03-04 MED ORDER — METHYLPREDNISOLONE SODIUM SUCC 125 MG IJ SOLR
125.0000 mg | INTRAMUSCULAR | Status: AC
  Administered 2015-03-04: 07:00:00 125 mg via INTRAVENOUS
  Filled 2015-03-04: qty 2

## 2015-03-04 MED ORDER — LIDOCAINE HCL (PF) 1 % IJ SOLN
INTRAMUSCULAR | Status: AC
  Filled 2015-03-04: qty 30

## 2015-03-04 MED ORDER — LORATADINE 10 MG PO TABS
10.0000 mg | ORAL_TABLET | Freq: Every day | ORAL | Status: DC
  Administered 2015-03-04 – 2015-03-06 (×3): 10 mg via ORAL
  Filled 2015-03-04 (×3): qty 1

## 2015-03-04 MED ORDER — HEPARIN (PORCINE) IN NACL 2-0.9 UNIT/ML-% IJ SOLN
INTRAMUSCULAR | Status: AC
  Filled 2015-03-04: qty 1000

## 2015-03-04 MED ORDER — BIVALIRUDIN 250 MG IV SOLR
INTRAVENOUS | Status: AC
  Filled 2015-03-04: qty 250

## 2015-03-04 MED ORDER — SODIUM CHLORIDE 0.9 % IJ SOLN
3.0000 mL | Freq: Two times a day (BID) | INTRAMUSCULAR | Status: DC
  Administered 2015-03-04: 3 mL via INTRAVENOUS

## 2015-03-04 MED ORDER — SODIUM CHLORIDE 0.9 % IV SOLN
0.2500 mg/kg/h | INTRAVENOUS | Status: DC
  Filled 2015-03-04 (×2): qty 250

## 2015-03-04 MED ORDER — SODIUM CHLORIDE 0.9 % IV SOLN
0.2500 mg/kg/h | INTRAVENOUS | Status: AC
  Filled 2015-03-04: qty 250

## 2015-03-04 MED ORDER — APIXABAN 5 MG PO TABS
5.0000 mg | ORAL_TABLET | Freq: Two times a day (BID) | ORAL | Status: DC
  Administered 2015-03-05 – 2015-03-06 (×3): 5 mg via ORAL
  Filled 2015-03-04 (×4): qty 1

## 2015-03-04 NOTE — Progress Notes (Signed)
[facility]                                                                              Patient Demographics  Rodney [name], is a 74 y.o. male, DOB - Oct 16, 1941, VHQ:[id number]  Outpatient Primary MD for the patient is ROBERTS, Sharol Given, MD  LOS - 2   Brief HPI   Briefly, [name] is a 74 year old male patient with hypertension, dyslipidemia, atrial fibrillation, chronic antiemetic medication, nicotine abuse presented to ED on 3/21 with chest pain. He was recently admitted to our service on 02/20/2015 and discharged on 02/21/2015 after undergoing TIA workup. During that hospitalization MRI/MRA were negative. Prior to this hospitalization he had an abnormal stress test that was done in the outpatient setting and heart cath had been scheduled for next week. He presented to the emergency department complaining of chest pain located in the retrosternal area characterized as tight and pressure-like, nonradiating, associated with shortness of breath. He was transferred to [facility] for cardiac catheterization.  Assessment & Plan   Chest pain Patient has multiple risk factors for CAD including hypertension, dyslipidemia, tobacco abuse, outpatient abnormal stress test. Cardiac catheterization was done yesterday and disease was noted in circumflex and ramus intermediate vessel. Patient underwent PTCI this morning. A bare-metal stent was placed into the distal circumflex as well as into the ramus intermediate vessel. Patient was given Plavix. Further management per cardiology.  Essential hypertension Currently stable, continue Coreg, Imdur, Avapro, Lasix.  PAF (paroxysmal atrial fibrillation) Rate is well-controlled. He was on Apixaban. This was discontinued yesterday and he was placed on heparin. Management per cardiology.  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 Continue Lasix.  Tobacco abuse Patient counseled on nicotine cessation, continue nicotine patch  History of TIA Stable. No new focal neurological deficits  DVT prophylaxis: On anticoagulation Code Status: Full code  Family Communication: Discussed with patient Disposition Plan: Discharge when cleared by cardiology. Possibly tomorrow.   Procedures  Cardiac cath 3/22 and 3/23   Consults   Cardiology  Medications  Scheduled Meds: . aspirin EC  81 mg Oral Daily  . carvedilol  6.25 mg Oral BID  . [START ON 03/05/2015] clopidogrel  75 mg Oral Q breakfast  . furosemide  20 mg Oral Daily  . isosorbide mononitrate  30 mg Oral Daily  . nicotine  14 mg Transdermal Daily  . omega-3 acid ethyl esters  1 g Oral Daily  . potassium chloride  10 mEq Oral Daily  . rosuvastatin  40 mg Oral q1800   Continuous Infusions: . sodium chloride 50 mL/hr at 03/03/15 0530  . sodium chloride 125 mL/hr at 03/03/15 2251  . sodium chloride 100 mL/hr at 03/04/15 0725  . sodium chloride    . bivalirudin (ANGIOMAX) infusion 5 mg/mL (Cath Lab,ACS,PCI indication)    . heparin Stopped (03/04/15 0805)  . nitroGLYCERIN     PRN Meds:.acetaminophen, acetaminophen, acetaminophen, gi cocktail, labetalol, morphine injection, nitroGLYCERIN, ondansetron (ZOFRAN) IV, ondansetron (ZOFRAN) IV, ondansetron (ZOFRAN) IV   Antibiotics  None   Subjective:   Patient was seen post PTCI. He denied any pain or discomfort. Denied any shortness of breath. No tingling or numbness in his lower extremities.  Objective:   Filed Vitals:   03/04/15 0500 03/04/15 0600 03/04/15 0755 03/04/15 0832  BP: 140/78 158/80 162/80   Pulse:   65 69  Temp:   97.5 F (36.4 C)   TempSrc:   Oral   Resp: 14 20 18    Height:      Weight:      SpO2:   95%     Wt Readings from Last 3 Encounters:  03/04/15 94.8 kg (208 lb 15.9 oz)  02/20/15 97.523 kg (215 lb)  02/17/15 97.705 kg  (215 lb 6.4 oz)     Intake/Output Summary (Last 24 hours) at 03/04/15 1153 Last data filed at 03/04/15 0806  Gross per 24 hour  Intake   1812 ml  Output    300 ml  Net   1512 ml    Exam  General: Alert and oriented x 3, NAD  CVS: S1 S2 auscultated, no rubs, murmurs or gallops. Regular rate and rhythm.  Respiratory: Clear to auscultation bilaterally, no wheezing, rales or rhonchi  Abdomen: Soft, nontender, nondistended, + bowel sounds  Ext: no cyanosis clubbing or edema    Radiology Reports Dg Chest 2 View  03/02/2015   CLINICAL DATA:  Chest tightness and difficulty breathing  EXAM: CHEST  2 VIEW  COMPARISON:  February 20, 2015  FINDINGS: There is no edema or consolidation. The heart size and pulmonary vascularity are normal. No adenopathy. There is degenerative change in the thoracic spine.  IMPRESSION: No edema or consolidation.   Electronically Signed   By: Lowella Grip III M.D.   On: 03/02/2015 08:07    CBC  Recent Labs Lab 03/02/15 0725 03/03/15 0940 03/04/15 0403  WBC 3.5* 4.0 13.7*  HGB 15.7 15.0 13.8  HCT 44.9 44.0 39.8  PLT 136* 130* 137*  MCV 91.1 93.0 90.9  MCH 31.8 31.7 31.5  MCHC 35.0 34.1 34.7  RDW 13.1 13.5 13.2    Chemistries   Recent Labs Lab 03/02/15 0725 03/03/15 0940 03/04/15 0403  NA 133* 139 138  K 3.6 4.0 4.0  CL 106 108 111  CO2 19 24 24   GLUCOSE 126* 121* 204*  BUN 17 15 17   CREATININE 1.30 1.28 1.32  CALCIUM 8.5 9.0 8.7   Coagulation profile  Recent Labs Lab 03/03/15 0940  INR 1.04   Cardiac Enzymes  Recent Labs Lab 03/02/15 1246 03/02/15 1520 03/02/15 1818  TROPONINI <0.03 <0.03 <0.03    Kaisha Wachob MD. Between 7am to 7pm - Pager - [phone]   03/04/2015 at 11:53 AM   [facility]  After 7pm go to [email or web address] - password [email or web address]  Call night coverage person covering after 7pm

## 2015-03-04 NOTE — CV Procedure (Addendum)
Rodney [name] is a 74 y.o. male    [id number]  [id number] LOCATION:  FACILITY: [facility]  PHYSICIAN: Troy Sine, MD, [facility] October 17, 1941   DATE OF PROCEDURE:  03/04/2015     PERCUTANEOUS CORONARY INTERVENTION    HISTORY:    Rodney [name] is a 74 y.o. male who has a history of hypertension, hyperlipidemia, abdominal aortic aneurysm, status post abdominal stent graft March 2010, as well as tobacco use.  He developed recent parishes will atrial fibrillation and has been on anticoagulation therapy.  There is a history of possible TIA.  He underwent right and left heart catheterization last evening, which revealed a subtotal stenosis in the ramus intermediate vessel as well as bifurcation stenosis in a dominant distal circumflex coronary artery.  He was hydrated overnight.  He is referred for percutaneous coronary intervention of his ramus intermediate vessel as well as distal circumflex vessel.   PROCEDURE:  Ramus intermediate vessel: PTCA/Bare metal stenting; left circumflex coronary artery: Angiosculpt/ PTCA of the distal circumflex marginal branch, and Angiosculpt/Bare metal stenting of the dominant distal circumflex into the PLA vessel.  The patient was brought to the [facility] cardiac catherization laboratory in the fasting state. He  was premedicated with Versed 1 mg and fentanyl 25 g initially. His  right groin was prepped and shaved in usual sterile fashion. Xylocaine 1% was used for local anesthesia. A 6 French sheath was inserted into the R femoral artery.  The patient had already received Plavix 600 mg 1 hour prior to the procedure on the floor.  Angiomax bolus plus infusion was administered.  A 6 French XB 4.0 guiding catheter was used.  Attention was initially directed at the ramus intermediate vessel which had 60% ostial stenosis with 99%/100% proximal stenosis and then another area of 95% mid stenosis.  Initially, a cougar wire was inserted.  This was unable to cross the subtotal  proximal stenosis.  A Euphora 2.012 mm balloon was then inserted to provide support, but still the cougar wire was not able to cross the lesion.  The system was then removed and a whisper wire was inserted.  By itself, this could not cross the stenosis, but with the support of the 2.012 mm balloon the subtotal occlusion was crossed and the wire was advanced down to the distal circumflex.  Multiple dilatations were made in the proximal to mid ramus intermediate vessel at 4, 6, 11, and 12 atm.  Since there was ostial stenosis which, in some views was 60% the decision was made to place the stent at the ostium and then to cover the both lesions which were additionally dilated.  A 2.7532 mm [employer] stent was inserted.  A bare metal stent was used since the patient will need anticoagulation therapy with his history of PAF and his history of prior TIA.  The Rebel stent was dilated at 11 and 12 atm. Marland Kitchen  An Walker Euphora 2.7520 mm balloon was used for post stent dilatation with inflations at 13 and ultimately up to 15 atm.  Angiography confirmed an excellent result.  Attention was then directed at the left circumflex vessel.  There was a bifurcation stenosis distally commencing in the distal AV groove extending into the dominant PDA, PLA system.  There also was 80-90% ostial stenosis of the distal circumflex marginal branch involved in this bifurcation stenosis.  A double wire technique was used and the whisper wire was advanced down into the PLA vessel and a Prowater wire was advanced  into the distal OM vessel.  A 2.010 mm Angiosculpt was then inserted over the wire into the distal marginal branch and several inflations at 3 and 4 atm were made.  The Angiosculpt balloon was then removed.  A 2.012 mm Euphora  balloon was inserted over the same wire.  Several dilatations were made at the ostium of this distal circumflex marginal branch.  The Angiosculpt was then inserted over the wire down into the PLA  vessel.  Multiple dilatations were made at this site up to 6 atm.  It appeared that there was some renarrowing in the distal circumflex branch.  Following dilatation in the AV groove circumflex into the dominant Pd/PLA system the initial Euphora was attempted to be passed down over the wire in the distal circumflex marginal but this was unable to cross due to the significant winging of the catheter and the wire had pulled back.  The entire system was removed and a new Prowater wire was then inserted and advanced into this distal marginal branch.  A new 2.012 mm balloon was then inserted and long inflation at 8 atm for 2 minutes was made with significant improvement in the narrowing being reduced to 30-40%.  Attention was then redirected at the distal circumflex into the PDA/PLA system.  The Prowater wire was removed from the OM branch and inserted into the PLA.  A 2.7520 mm Rebel bare-metal stent was then inserted and successfully deployed which jailed the OM branch.  Inflations at 11 and 13 atm were made.  A 3.015 mm [employer] [place] Euphora 4 balloon was then inserted for post stent dilatation with stent taper from 2.9-2.8 within the stented segment.  Angiography confirmed an excellent result in the stented segment with the lesions being reduced to 0%.  There again was ostial narrowing in the distal marginal branch.  Due to the contrast load and radiation time, decision was made not to redilate this ostium through the stented segment.  For this reason, the decision was made to continue Angiomax for 2 hours post procedure.  The patient had been on IV nitroglycerin throughout the procedure and had been titrated up to 40 g.  He received numerous doses of intracoronary nitroglycerin.  He also received small additional doses of Versed/fentanyl for a total dose of 3 mg Versed and 75 g of fentanyl.  The arterial sheath was sutured in place. He left the catheterization laboratory chest pain-free with stable  hemodynamics.   HEMODYNAMICS:   Central Aorta: 167/83    ANGIOGRAPHY:  At the start of the procedure, left main was a large caliber vessel which trifurcated into the LAD, intermediate-like vessel and the left circumflex coronary artery.  The ramus intermediate vessel had 60% ostial stenosis in some views eccentrically and then had a 99-100% subtotal stenosis proximally followed by another area of 95% stenosis.  There was very poor visualization beyond this.  Following successful intervention to the ramus vessel with PTCA, and ultimate stenting from the ostium to the mid vessel the entire stented region was reduced to 0%.  The ramus intermediate vessel is large caliber and extended to the apex and gave rise to several additional distal branches.  There was 30% narrowing beyond the branch after the stented segment  The left circumflex vessel was a dominant vessel that had 30% mild proximal narrowing.  In the distal aspect.  There was a bifurcation stenosis of 80-90% involving a smaller distal marginal branch as well as the distal AV groove into the PDA/PLA system which  was large caliber.  Following complex intervention with double wire technique, Angiosculpt scoring balloon of both the ostium of the distal circumflex marginal and the the dominant distal circumflex into the PLA system, the large circumflex PLA 80% stenosis ultimately treated with a 2.7520 mm bare-metal Rebel stent postdilated with taper from 2.9.  A 2.8 mm was reduced to 0%. There was brisk TIMI-3 flow.  The distal circumflex marginal branch, which was jailed by the stented region and had undergone Angiosculpt scoring balloon and long PTCA with the 90% stenosis being reduced to 30-40% did have ostial re-narrowing following being jailed by the stented segment.    IMPRESSION:  Difficult but successful complex intervention involving PTCA/BM stenting of a subtotal ramus intermediate vessel with the entire proximal region being reduced to  0% in an intermediate vessel which extended to the LV apex once opened; and Angiosculpt/PTCA of bifurcation stenosis involving the distal circumflex flex marginal branch and Angiosculpt/BM stenting of a dominant distal circumflex extending into the PD/PLA vessel.    Troy Sine, MD, [facility] 03/04/2015 11:21 AM

## 2015-03-04 NOTE — Progress Notes (Signed)
UR completed 

## 2015-03-04 NOTE — Progress Notes (Addendum)
Patient Name: Rodney [name] Date of Encounter: 03/04/2015  Primary Cardiologist: Hilty   Principal Problem:   Chest pain Active Problems:   Essential hypertension   PAF (paroxysmal atrial fibrillation)   Chronic combined systolic and diastolic heart failure, NYHA class 1   Tobacco abuse   Dyspnea   History of TIA (transient ischemic attack)   Mitral regurgitation   HX: anticoagulation   AA (aortic aneurysm)    SUBJECTIVE  Denies any CP or SOB.   CURRENT MEDS . aspirin EC  81 mg Oral Daily  . aspirin EC  81 mg Oral Daily  . carvedilol  6.25 mg Oral BID  . famotidine (PEPCID) IV  20 mg Intravenous Pre-Cath  . furosemide  20 mg Oral Daily  . isosorbide mononitrate  30 mg Oral Daily  . nicotine  14 mg Transdermal Daily  . omega-3 acid ethyl esters  1 g Oral Daily  . potassium chloride  10 mEq Oral Daily  . rosuvastatin  40 mg Oral q1800  . sodium chloride  3 mL Intravenous Q12H    OBJECTIVE  Filed Vitals:   03/04/15 0300 03/04/15 0400 03/04/15 0500 03/04/15 0600  BP: 106/53 125/90 140/78 158/80  Pulse:  70    Temp:  97.4 F (36.3 C)    TempSrc:  Oral    Resp: 16 19 14 20   Height:      Weight:      SpO2:  94%      Intake/Output Summary (Last 24 hours) at 03/04/15 0727 Last data filed at 03/04/15 0433  Gross per 24 hour  Intake    475 ml  Output    300 ml  Net    175 ml   Filed Weights   03/03/15 0540 03/03/15 2022 03/04/15 0000  Weight: 209 lb 1.6 oz (94.847 kg) 208 lb 15.9 oz (94.8 kg) 208 lb 15.9 oz (94.8 kg)    PHYSICAL EXAM  General: Pleasant, NAD. Neuro: Alert and oriented X 3. Moves all extremities spontaneously. Psych: Normal affect. HEENT:  Normal  Neck: Supple without bruits or JVD. Lungs:  Resp regular and unlabored, CTA. Heart: RRR no s3, s4, or murmurs. Abdomen: Soft, non-tender, non-distended, BS + x 4.  Extremities: No clubbing, cyanosis or edema. DP/PT/Radials 2+ and equal bilaterally.  Accessory Clinical  Findings  CBC  Recent Labs  03/03/15 0940 03/04/15 0403  WBC 4.0 13.7*  HGB 15.0 13.8  HCT 44.0 39.8  MCV 93.0 90.9  PLT 130* 628*   Basic Metabolic Panel  Recent Labs  03/03/15 0940 03/04/15 0403  NA 139 138  K 4.0 4.0  CL 108 111  CO2 24 24  GLUCOSE 121* 204*  BUN 15 17  CREATININE 1.28 1.32  CALCIUM 9.0 8.7   Cardiac Enzymes  Recent Labs  03/02/15 1246 03/02/15 1520 03/02/15 1818  TROPONINI <0.03 <0.03 <0.03    TELE NSR with HR 60-70s, frequent PACs    ECG  NSR with TWI in inferolateral leads  Echocardiogram 03/03/2015  - Left ventricle: The cavity size was normal. Wall thickness was increased in a pattern of moderate LVH. Systolic function was moderately reduced. The estimated ejection fraction was in the range of 35% to 40%. Diffuse hypokinesis. There is mild hypokinesis of the mid-apicalanterolateral myocardium. Doppler parameters are consistent with abnormal left ventricular relaxation (grade 1 diastolic dysfunction). Doppler parameters are consistent with high ventricular filling pressure. - Mitral valve: There was mild regurgitation.  Impressions:  - Global L. Strain -13.4.  Radiology/Studies  Dg Chest 2 View  03/02/2015   CLINICAL DATA:  Chest tightness and difficulty breathing  EXAM: CHEST  2 VIEW  COMPARISON:  February 20, 2015  FINDINGS: There is no edema or consolidation. The heart size and pulmonary vascularity are normal. No adenopathy. There is degenerative change in the thoracic spine.  IMPRESSION: No edema or consolidation.   Electronically Signed   By: Lowella Grip III M.D.   On: 03/02/2015 08:07   Dg Chest 2 View  02/19/2015   CLINICAL DATA:  Preoperative evaluation for cardiac catheterization, known shortness of Breath  EXAM: CHEST  2 VIEW  COMPARISON:  01/03/2015  FINDINGS: Cardiac shadow is within normal limits. The lungs are clear bilaterally. The previously seen bibasilar infiltrates have resolved. No  acute bony abnormality is seen. Degenerative changes of the thoracic spine are again noted.  IMPRESSION: No active cardiopulmonary disease.   Electronically Signed   By: Inez Catalina M.D.   On: 02/19/2015 09:21   Ct Head Wo Contrast  02/20/2015   CLINICAL DATA:  Left-sided numbness upon waking.  EXAM: CT HEAD WITHOUT CONTRAST  TECHNIQUE: Contiguous axial images were obtained from the base of the skull through the vertex without intravenous contrast.  COMPARISON:  Head CT 11/24/2007, brain MRI 11/30/2007  FINDINGS: No intracranial hemorrhage, mass effect, or midline shift. No hydrocephalus. The basilar cisterns are patent. No evidence of territorial infarct. No intracranial fluid collection. Calvarium is intact. Included paranasal sinuses and mastoid air cells are well aerated.  IMPRESSION: No acute intracranial abnormality.   Electronically Signed   By: Jeb Levering M.D.   On: 02/20/2015 06:55   Mr Brain Wo Contrast  02/20/2015   CLINICAL DATA:  Heaviness and tingling in the left upper and lower extremity. Symptoms lasted approximately 30 minutes and then resolved. Patient is scheduled for cardiac catheterization next week.  EXAM: MRI HEAD WITHOUT CONTRAST  MRA HEAD WITHOUT CONTRAST  TECHNIQUE: Multiplanar, multiecho pulse sequences of the brain and surrounding structures were obtained without intravenous contrast. Angiographic images of the head were obtained using MRA technique without contrast.  COMPARISON:  CT head without contrast from the same day. MRI of the brain 11/30/2007.  FINDINGS: MRI HEAD FINDINGS  The diffusion-weighted images demonstrate no evidence for acute or subacute infarct.  No acute hemorrhage or mass lesion is present. The ventricles are of normal size. Mild periventricular white matter changes are noted bilaterally. No significant extraaxial fluid collection is present.  Flow is present in the major intracranial arteries. The globes and orbits are within normal limits. Polyps and  mucosal thickening are noted along the floor of the maxillary sinuses bilaterally. There is mild mucosal thickening in the anterior ethmoid air cells and inferior frontal sinuses. The sphenoid sinuses are clear. Minimal fluid is present in the left mastoid air cells. No obstructing nasopharyngeal lesion is evident.  MRA HEAD FINDINGS  Internal carotid arteries are within normal limits from the high cervical segments through the ICA termini bilaterally. The A1 and M1 segments are normal. Anterior communicating artery is not visualized. The MCA bifurcations are intact. There is moderate attenuation of distal MCA branch vessels without proximal stenosis or occlusion.  The vertebral arteries are codominant. The right PICA is visualized and normal. The left AICA is dominant. The basilar artery is within normal limits.  The right posterior cerebral artery originates from the basilar tip. The left posterior cerebral artery is fed by P1 segment and posterior communicating artery. There is moderate attenuation of  distal PCA branch vessels without significant proximal stenosis or occlusion.  IMPRESSION: 1. No acute intracranial abnormality. 2. Mild periventricular white matter changes are slightly advanced for age. This likely reflects the sequela of chronic microvascular ischemia. 3. Moderate distal small vessel attenuation in both the anterior and posterior circulation likely reflects atherosclerotic and hypertensive. 4. No significant proximal stenosis, aneurysm, or branch vessel occlusion.   Electronically Signed   By: San Morelle M.D.   On: 02/20/2015 18:37   Dg Chest Port 1 View  02/20/2015   CLINICAL DATA:  Awoke this morning with numbness in left arm and left leg.  EXAM: PORTABLE CHEST - 1 VIEW  COMPARISON:  Frontal and lateral views 02/19/2015  FINDINGS: The cardiomediastinal contours are unchanged allowing for differences in technique. Pulmonary vasculature is normal. No consolidation, pleural effusion,  or pneumothorax. No acute osseous abnormalities are seen. There is degenerative change throughout the spine.  IMPRESSION: No acute pulmonary process.   Electronically Signed   By: Jeb Levering M.D.   On: 02/20/2015 06:50   Mr Jodene Nam Head/brain Wo Cm  02/20/2015   CLINICAL DATA:  Heaviness and tingling in the left upper and lower extremity. Symptoms lasted approximately 30 minutes and then resolved. Patient is scheduled for cardiac catheterization next week.  EXAM: MRI HEAD WITHOUT CONTRAST  MRA HEAD WITHOUT CONTRAST  TECHNIQUE: Multiplanar, multiecho pulse sequences of the brain and surrounding structures were obtained without intravenous contrast. Angiographic images of the head were obtained using MRA technique without contrast.  COMPARISON:  CT head without contrast from the same day. MRI of the brain 11/30/2007.  FINDINGS: MRI HEAD FINDINGS  The diffusion-weighted images demonstrate no evidence for acute or subacute infarct.  No acute hemorrhage or mass lesion is present. The ventricles are of normal size. Mild periventricular white matter changes are noted bilaterally. No significant extraaxial fluid collection is present.  Flow is present in the major intracranial arteries. The globes and orbits are within normal limits. Polyps and mucosal thickening are noted along the floor of the maxillary sinuses bilaterally. There is mild mucosal thickening in the anterior ethmoid air cells and inferior frontal sinuses. The sphenoid sinuses are clear. Minimal fluid is present in the left mastoid air cells. No obstructing nasopharyngeal lesion is evident.  MRA HEAD FINDINGS  Internal carotid arteries are within normal limits from the high cervical segments through the ICA termini bilaterally. The A1 and M1 segments are normal. Anterior communicating artery is not visualized. The MCA bifurcations are intact. There is moderate attenuation of distal MCA branch vessels without proximal stenosis or occlusion.  The vertebral  arteries are codominant. The right PICA is visualized and normal. The left AICA is dominant. The basilar artery is within normal limits.  The right posterior cerebral artery originates from the basilar tip. The left posterior cerebral artery is fed by P1 segment and posterior communicating artery. There is moderate attenuation of distal PCA branch vessels without significant proximal stenosis or occlusion.  IMPRESSION: 1. No acute intracranial abnormality. 2. Mild periventricular white matter changes are slightly advanced for age. This likely reflects the sequela of chronic microvascular ischemia. 3. Moderate distal small vessel attenuation in both the anterior and posterior circulation likely reflects atherosclerotic and hypertensive. 4. No significant proximal stenosis, aneurysm, or branch vessel occlusion.   Electronically Signed   By: San Morelle M.D.   On: 02/20/2015 18:37    ASSESSMENT AND PLAN  1. Chest pain with dyspnea  - L&RHC to assess coronary anatomy and  degree of MR  - LHC 03/03/2015 multivessel CAD with 30-40% prox LAD narrowing, 90-95% RI/high OM stenosis, 80% distal LCX stenosis, widely patent abdominal aortic iliac stent graft.  - pending PCI today with BMS. Continue ASA, Imdur, statin, coreg. Will discuss with MD regarding mildly increased Cr, currently on 117ml/hr IVF  - will need to adjust BP med after cath, home valsartan on hold  2. 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 PAF: eliquis held 4. Mitral regurg: ?of rupture chordae, although no clear flail 5. Chronic combined systolic and diastolic HF 6. Tobacco abuse 7. H/o TIA  Signed, Almyra Deforest PA-C Pager: [phone]   Patient seen and examined. Agree with assessment  and plan. No recurrent chest pain. Plavix 600 mg given this am. For PCI of Ramus and re-assess distal Lcx. With need for anticoagulation will use BMS. Premedicated for contrast allergy. The risks and benefits of a PCI  including, but not limited to, death, stroke, MI, kidney damage and bleeding were discussed with the patient who indicates understanding and agrees to proceed.    Troy Sine, MD, [facility] 03/04/2015 8:30 AM

## 2015-03-04 NOTE — Progress Notes (Signed)
Site area: right groin  Site Prior to Removal:  Level 1  Pressure Applied For 20 MINUTES    Minutes Beginning at 1615  Manual:   Yes.    Patient Status During Pull:  AAO x 4  Post Pull Groin Site:  Level 0  Post Pull Instructions Given:  Yes.    Post Pull Pulses Present:  Yes.    Dressing Applied:  Yes.    Comments:  Tolerated procedure well

## 2015-03-05 DIAGNOSIS — I25110 Atherosclerotic heart disease of native coronary artery with unstable angina pectoris: Secondary | ICD-10-CM

## 2015-03-05 LAB — CBC
HCT: 36.9 % — ABNORMAL LOW (ref 39.0–52.0)
HEMOGLOBIN: 12.6 g/dL — AB (ref 13.0–17.0)
MCH: 31.4 pg (ref 26.0–34.0)
MCHC: 34.1 g/dL (ref 30.0–36.0)
MCV: 92 fL (ref 78.0–100.0)
Platelets: 124 10*3/uL — ABNORMAL LOW (ref 150–400)
RBC: 4.01 MIL/uL — ABNORMAL LOW (ref 4.22–5.81)
RDW: 13.6 % (ref 11.5–15.5)
WBC: 14.2 10*3/uL — ABNORMAL HIGH (ref 4.0–10.5)

## 2015-03-05 LAB — BASIC METABOLIC PANEL
ANION GAP: 6 (ref 5–15)
BUN: 21 mg/dL (ref 6–23)
CALCIUM: 8.7 mg/dL (ref 8.4–10.5)
CO2: 22 mmol/L (ref 19–32)
Chloride: 111 mmol/L (ref 96–112)
Creatinine, Ser: 1.21 mg/dL (ref 0.50–1.35)
GFR calc Af Amer: 67 mL/min — ABNORMAL LOW (ref >90)
GFR calc non Af Amer: 58 mL/min — ABNORMAL LOW (ref >90)
Glucose, Bld: 112 mg/dL — ABNORMAL HIGH (ref 70–99)
Potassium: 4 mmol/L (ref 3.5–5.1)
Sodium: 139 mmol/L (ref 135–145)

## 2015-03-05 MED ORDER — SODIUM CHLORIDE 0.9 % IV SOLN: INTRAVENOUS | Status: DC

## 2015-03-05 MED ORDER — MORPHINE SULFATE 2 MG/ML IJ SOLN: 1.0000 mg | Freq: Once | INTRAMUSCULAR | Status: DC

## 2015-03-05 MED ORDER — NITROGLYCERIN IN D5W 200-5 MCG/ML-% IV SOLN
0.0000 ug/min | INTRAVENOUS | Status: DC
  Administered 2015-03-05: 20 ug/min via INTRAVENOUS

## 2015-03-05 MED ORDER — ANGIOPLASTY BOOK
Freq: Once | Status: AC
  Administered 2015-03-05: 22:00:00
  Filled 2015-03-05: qty 1

## 2015-03-05 MED ORDER — HYDRALAZINE HCL 20 MG/ML IJ SOLN
10.0000 mg | Freq: Four times a day (QID) | INTRAMUSCULAR | Status: DC | PRN
  Administered 2015-03-05: 10 mg via INTRAVENOUS
  Filled 2015-03-05: qty 1

## 2015-03-05 MED ORDER — FAMOTIDINE 20 MG PO TABS
20.0000 mg | ORAL_TABLET | Freq: Two times a day (BID) | ORAL | Status: DC
Start: 2015-03-05 — End: 2015-03-06
  Administered 2015-03-05 – 2015-03-06 (×3): 20 mg via ORAL
  Filled 2015-03-05 (×4): qty 1

## 2015-03-05 MED ORDER — MORPHINE SULFATE 2 MG/ML IJ SOLN
1.0000 mg | Freq: Once | INTRAMUSCULAR | Status: AC
  Administered 2015-03-05: 1 mg via INTRAVENOUS

## 2015-03-05 MED ORDER — NITROGLYCERIN 0.4 MG SL SUBL
0.4000 mg | SUBLINGUAL_TABLET | Freq: Once | SUBLINGUAL | Status: AC
  Administered 2015-03-05: 15:00:00 0.4 mg via SUBLINGUAL

## 2015-03-05 MED ORDER — NITROGLYCERIN IN D5W 200-5 MCG/ML-% IV SOLN
INTRAVENOUS | Status: AC
Start: 2015-03-05 — End: 2015-03-06
  Filled 2015-03-05: qty 250

## 2015-03-05 MED ORDER — ALUM & MAG HYDROXIDE-SIMETH 200-200-20 MG/5ML PO SUSP
15.0000 mL | Freq: Four times a day (QID) | ORAL | Status: DC | PRN
  Filled 2015-03-05: qty 30

## 2015-03-05 MED FILL — Sodium Chloride IV Soln 0.9%: INTRAVENOUS | Qty: 50 | Status: AC

## 2015-03-05 NOTE — Progress Notes (Signed)
Dr. [place] Pink requested we consider taking patient onto our service. Dr. Angelena Form agrees. I have changed in the computer. Lativia Velie PA-C

## 2015-03-05 NOTE — Progress Notes (Signed)
C/o  Chest pain 7/10  , stat ekg done, nitro 1 tab SL  and morphine 2 mg iv given , with some relief,  seen by dr Angelena Form , nitro iv started , as ordered, monitored patient.

## 2015-03-05 NOTE — Progress Notes (Signed)
Patient: Rodney [name] / Admit Date: 03/02/2015 / Date of Encounter: 03/05/2015, 7:51 AM   Subjective: C/o indigestion this AM like prior acid reflux, a/w belching. This is different than presenting pain. Hot tea is easing symptoms.   Objective: Telemetry: NSR occasional PVCs Physical Exam: Blood pressure 159/69, pulse 64, temperature 97.5 F (36.4 C), temperature source Oral, resp. rate 20, height 6\' 2"  (1.88 m), weight 209 lb 7 oz (95 kg), SpO2 100 %. General: Well developed, well nourished M, in no acute distress. Head: Normocephalic, atraumatic, sclera non-icteric, no xanthomas, nares are without discharge. Neck:  JVP not elevated. Lungs: Clear bilaterally to auscultation without wheezes, rales, or rhonchi. Breathing is unlabored. Heart: RRR S1 S2 without murmurs, rubs, or gallops.  Abdomen: Soft, non-tender, non-distended with normoactive bowel sounds. No rebound/guarding. Extremities: No clubbing or cyanosis. No edema. Distal pedal pulses are 2+ and equal bilaterally. Right groin cath site with minimal ecchymosis; no hematoma or bruit Neuro: Alert and oriented X 3. Moves all extremities spontaneously. Psych:  Responds to questions appropriately with a normal affect.   Intake/Output Summary (Last 24 hours) at 03/05/15 0751 Last data filed at 03/05/15 0446  Gross per 24 hour  Intake 2288.33 ml  Output   4450 ml  Net -2161.67 ml    Inpatient Medications:  . apixaban  5 mg Oral BID  . aspirin EC  81 mg Oral Daily  . carvedilol  6.25 mg Oral BID  . clopidogrel  75 mg Oral Q breakfast  . furosemide  20 mg Oral Daily  . isosorbide mononitrate  30 mg Oral Daily  . loratadine  10 mg Oral Daily  . nicotine  14 mg Transdermal Daily  . omega-3 acid ethyl esters  1 g Oral Daily  . potassium chloride  10 mEq Oral Daily  . rosuvastatin  40 mg Oral q1800   Infusions:  . nitroGLYCERIN 40 mcg/min (03/04/15 1200)    Labs:  Recent Labs  03/04/15 0403 03/05/15 0412  NA 138 139    K 4.0 4.0  CL 111 111  CO2 24 22  GLUCOSE 204* 112*  BUN 17 21  CREATININE 1.32 1.21  CALCIUM 8.7 8.7   No results for input(s): AST, ALT, ALKPHOS, BILITOT, PROT, ALBUMIN in the last 72 hours.  Recent Labs  03/04/15 0403 03/05/15 0412  WBC 13.7* 14.2*  HGB 13.8 12.6*  HCT 39.8 36.9*  MCV 90.9 92.0  PLT 137* 124*    Recent Labs  03/02/15 1246 03/02/15 1520 03/02/15 1818  TROPONINI <0.03 <0.03 <0.03   Invalid input(s): POCBNP No results for input(s): HGBA1C in the last 72 hours.   Radiology/Studies:  Dg Chest 2 View  03/02/2015   CLINICAL DATA:  Chest tightness and difficulty breathing  EXAM: CHEST  2 VIEW  COMPARISON:  February 20, 2015  FINDINGS: There is no edema or consolidation. The heart size and pulmonary vascularity are normal. No adenopathy. There is degenerative change in the thoracic spine.  IMPRESSION: No edema or consolidation.   Electronically Signed   By: Lowella Grip III M.D.   On: 03/02/2015 08:07   Dg Chest 2 View  02/19/2015   CLINICAL DATA:  Preoperative evaluation for cardiac catheterization, known shortness of Breath  EXAM: CHEST  2 VIEW  COMPARISON:  01/03/2015  FINDINGS: Cardiac shadow is within normal limits. The lungs are clear bilaterally. The previously seen bibasilar infiltrates have resolved. No acute bony abnormality is seen. Degenerative changes of the thoracic spine are again noted.  IMPRESSION: No active cardiopulmonary disease.   Electronically Signed   By: Inez Catalina M.D.   On: 02/19/2015 09:21   Ct Head Wo Contrast  02/20/2015   CLINICAL DATA:  Left-sided numbness upon waking.  EXAM: CT HEAD WITHOUT CONTRAST  TECHNIQUE: Contiguous axial images were obtained from the base of the skull through the vertex without intravenous contrast.  COMPARISON:  Head CT 11/24/2007, brain MRI 11/30/2007  FINDINGS: No intracranial hemorrhage, mass effect, or midline shift. No hydrocephalus. The basilar cisterns are patent. No evidence of territorial  infarct. No intracranial fluid collection. Calvarium is intact. Included paranasal sinuses and mastoid air cells are well aerated.  IMPRESSION: No acute intracranial abnormality.   Electronically Signed   By: Jeb Levering M.D.   On: 02/20/2015 06:55   Mr Brain Wo Contrast  02/20/2015   CLINICAL DATA:  Heaviness and tingling in the left upper and lower extremity. Symptoms lasted approximately 30 minutes and then resolved. Patient is scheduled for cardiac catheterization next week.  EXAM: MRI HEAD WITHOUT CONTRAST  MRA HEAD WITHOUT CONTRAST  TECHNIQUE: Multiplanar, multiecho pulse sequences of the brain and surrounding structures were obtained without intravenous contrast. Angiographic images of the head were obtained using MRA technique without contrast.  COMPARISON:  CT head without contrast from the same day. MRI of the brain 11/30/2007.  FINDINGS: MRI HEAD FINDINGS  The diffusion-weighted images demonstrate no evidence for acute or subacute infarct.  No acute hemorrhage or mass lesion is present. The ventricles are of normal size. Mild periventricular white matter changes are noted bilaterally. No significant extraaxial fluid collection is present.  Flow is present in the major intracranial arteries. The globes and orbits are within normal limits. Polyps and mucosal thickening are noted along the floor of the maxillary sinuses bilaterally. There is mild mucosal thickening in the anterior ethmoid air cells and inferior frontal sinuses. The sphenoid sinuses are clear. Minimal fluid is present in the left mastoid air cells. No obstructing nasopharyngeal lesion is evident.  MRA HEAD FINDINGS  Internal carotid arteries are within normal limits from the high cervical segments through the ICA termini bilaterally. The A1 and M1 segments are normal. Anterior communicating artery is not visualized. The MCA bifurcations are intact. There is moderate attenuation of distal MCA branch vessels without proximal stenosis or  occlusion.  The vertebral arteries are codominant. The right PICA is visualized and normal. The left AICA is dominant. The basilar artery is within normal limits.  The right posterior cerebral artery originates from the basilar tip. The left posterior cerebral artery is fed by P1 segment and posterior communicating artery. There is moderate attenuation of distal PCA branch vessels without significant proximal stenosis or occlusion.  IMPRESSION: 1. No acute intracranial abnormality. 2. Mild periventricular white matter changes are slightly advanced for age. This likely reflects the sequela of chronic microvascular ischemia. 3. Moderate distal small vessel attenuation in both the anterior and posterior circulation likely reflects atherosclerotic and hypertensive. 4. No significant proximal stenosis, aneurysm, or branch vessel occlusion.   Electronically Signed   By: San Morelle M.D.   On: 02/20/2015 18:37   Dg Chest Port 1 View  02/20/2015   CLINICAL DATA:  Awoke this morning with numbness in left arm and left leg.  EXAM: PORTABLE CHEST - 1 VIEW  COMPARISON:  Frontal and lateral views 02/19/2015  FINDINGS: The cardiomediastinal contours are unchanged allowing for differences in technique. Pulmonary vasculature is normal. No consolidation, pleural effusion, or pneumothorax. No acute osseous abnormalities  are seen. There is degenerative change throughout the spine.  IMPRESSION: No acute pulmonary process.   Electronically Signed   By: Jeb Levering M.D.   On: 02/20/2015 06:50   Mr Jodene Nam Head/brain Wo Cm  02/20/2015   CLINICAL DATA:  Heaviness and tingling in the left upper and lower extremity. Symptoms lasted approximately 30 minutes and then resolved. Patient is scheduled for cardiac catheterization next week.  EXAM: MRI HEAD WITHOUT CONTRAST  MRA HEAD WITHOUT CONTRAST  TECHNIQUE: Multiplanar, multiecho pulse sequences of the brain and surrounding structures were obtained without intravenous contrast.  Angiographic images of the head were obtained using MRA technique without contrast.  COMPARISON:  CT head without contrast from the same day. MRI of the brain 11/30/2007.  FINDINGS: MRI HEAD FINDINGS  The diffusion-weighted images demonstrate no evidence for acute or subacute infarct.  No acute hemorrhage or mass lesion is present. The ventricles are of normal size. Mild periventricular white matter changes are noted bilaterally. No significant extraaxial fluid collection is present.  Flow is present in the major intracranial arteries. The globes and orbits are within normal limits. Polyps and mucosal thickening are noted along the floor of the maxillary sinuses bilaterally. There is mild mucosal thickening in the anterior ethmoid air cells and inferior frontal sinuses. The sphenoid sinuses are clear. Minimal fluid is present in the left mastoid air cells. No obstructing nasopharyngeal lesion is evident.  MRA HEAD FINDINGS  Internal carotid arteries are within normal limits from the high cervical segments through the ICA termini bilaterally. The A1 and M1 segments are normal. Anterior communicating artery is not visualized. The MCA bifurcations are intact. There is moderate attenuation of distal MCA branch vessels without proximal stenosis or occlusion.  The vertebral arteries are codominant. The right PICA is visualized and normal. The left AICA is dominant. The basilar artery is within normal limits.  The right posterior cerebral artery originates from the basilar tip. The left posterior cerebral artery is fed by P1 segment and posterior communicating artery. There is moderate attenuation of distal PCA branch vessels without significant proximal stenosis or occlusion.  IMPRESSION: 1. No acute intracranial abnormality. 2. Mild periventricular white matter changes are slightly advanced for age. This likely reflects the sequela of chronic microvascular ischemia. 3. Moderate distal small vessel attenuation in both the  anterior and posterior circulation likely reflects atherosclerotic and hypertensive. 4. No significant proximal stenosis, aneurysm, or branch vessel occlusion.   Electronically Signed   By: San Morelle M.D.   On: 02/20/2015 18:37     Assessment and Plan   1. CAD with recent abnormal stress test - previously with mild-mod disease by cath in 2012, found to have recently abnormal stress test 02/10/15 and elevated troponin - plan was for outpatient cath but he presented with increasing dyspnea - Diagnostic LHC 03/03/15 showed multivessel CAD with nonobstructive disease in LAD/RCA, but ramus intermediate/high OM, proximal and mid stenoses of 95 and 90%; 30% narrowing in the proximal AV groove circumflex with distal 80% stenosis in the larger distal branch with 70% narrowing in the smaller branch  - Staged LHC 03/04/15 s/p complex intervention involving PTCA/BMS to subtotal ramus intermediate vessel, and angiosculpt/PTCA of bifurcation stenosis involving the distal circumflex flex marginal branch and Angiosculpt/BM stenting of a dominant distal circumflex extending into the PD/PLA vessel - he is on Eliquis, ASA, and Plavix - will clarify plan for triple therapy with MD - c/o indigestion this AM, different than admitting sx - EKG with TWI inferiorly  and V4-V6, similar to prior tracings (QTC improved from before) - will try GI cocktail and obtain repeat EKG  2. Chronic combined systolic/diastolic CHF - EF 01% in 6553 - 2D echo 03/03/15: mod LVH, EF 35-40% - consider resumption of valsartan today  3. Mitral regurgitation ?of rupture chordae, although no clear flail - 2D Echo 12/2014: mild-mod MR, 2D echo 03/03/15 with mild MR, LHC 03/03/15 without significant MR  4. Paroxysmal atrial fibrillation - maintaining NSR - as above will discuss plans for triple therapy with MD  5. Recent TIA early 02/2015 6. HTN 7. Tobacco abuse, counseled regarding cessation 8. Leukocytosis likely due to premed for  contrast allergy 9. Mild thrombocytopenia, appearing chronic with previous mild leukopenia as well - would consider monitoring in followup 10. AAA s/p stent grafting in 2012  Signed, Dayna Dunn PA-C  Patient continued to have low grade chest discomfort this AM, different than prior angina.  GI cocktail eased pain slightly (4->3/10). It then returned. 1st SL NTG didn't affect pain but then 2nd NTG brought it down to a 2.  He then received his AM meds. I was about to order repeat troponin/morphine because CP was about a 3, then he had a large loud belch which totally and completely relieved the discomfort. Will continue to monitor. Dayna Dunn PA-C 10:52 AM   I have personally seen and examined this patient with Melina Copa, PA-C. I agree with the assessment and plan as outlined above. Will plan to use Plavix and Eliquis for one month post PCI/bare metal stent then can change to ASA and Eliquis. Will monitor on our service today and plan to treat mild chest pain with Pepcid and GI cocktail and it seems to be GI related. EKG is unchanged this am. Probable discharge tomorrow am.   [facility] 03/05/2015 11:28 AM

## 2015-03-05 NOTE — Progress Notes (Signed)
BP running higher this afternoon despite usual meds. His valsartan remains on hold since admission, but given 2 back to back caths and Cr of 1.21 (GFR 67) I do not necessarily want to restart just yet. HR 50s-60s so will not titrate Coreg. BPs yesterday were labile at times (low 100s->160s). Will tentatively treat with IV hydralazine for now PRN. If creatinine is stable in AM anticipate resumption of ARB. We also have room to move up on his Imdur as well if needed. Jossalyn Forgione PA-C

## 2015-03-05 NOTE — Progress Notes (Signed)
CARDIAC REHAB PHASE I   PRE:  Rate/Rhythm: 73 SR  BP:  Supine: 186/100  Sitting:   Standing:    SaO2:   MODE:  Ambulation: 1000 ft   POST:  Rate/Rhythm: 78 SR  BP:  Supine:   Sitting: 209/115  Standing:    SaO2:  1315-1430 On arrival pt states that he was not having pain now. He tolerated ambulation well. BP elevated before and after walk, reported to nurse. Started stent education with pt and wife. Discussed smoking cessation with pt. He states that it is over and he is not going to smoke any more. He was using the patch at home on and off, then he would stop using it and go back to smoking. I gave him information on cessation. Pt seems committed to quitting now. I discussed Outpt. CRP with him and left him information. I will follow pt tomorrow to finish education.  Rodney Langton RN 03/05/2015 2:28 PM

## 2015-03-05 NOTE — Progress Notes (Signed)
[facility]                                                                             Patient Demographics  Rodney [name], is a 74 y.o. male, DOB - [birth year], XNT:[id number]  Outpatient Primary MD for the patient is Rodney [name], Rodney Given, MD LOS - 3  Brief HPI   Briefly, Rodney [name] is a 74 year old male patient with hypertension, dyslipidemia, atrial fibrillation, chronic antiemetic medication, nicotine abuse presented to ED on 3/21 with chest pain. He was recently admitted to our service on 02/20/2015 and discharged on 02/21/2015 after undergoing TIA workup. During that hospitalization MRI/MRA were negative. Prior to this hospitalization he had an abnormal stress test that was done in the outpatient setting and heart cath had been scheduled for next week. He presented to the emergency department complaining of chest pain located in the retrosternal area characterized as tight and pressure-like, nonradiating, associated with shortness of breath. He was transferred to [facility] for cardiac catheterization.  Assessment & Plan   Chest pain Patient has multiple risk factors for CAD including hypertension, dyslipidemia, tobacco abuse, outpatient abnormal stress test. Cardiac catheterization was done 3/22 and disease was noted in circumflex and ramus intermediate vessel. Patient underwent PTCI 3/23. A bare-metal stent was placed into the distal circumflex as well as into the ramus intermediate vessel. Patient was [name] Plavix. Further management per cardiology. Reports chest discomfort this morning, which is thought to be likely due to dyspepsia. Abdomen is benign. EKG was repeated which did not show any new changes. Patient experienced some relief after he was [name] Maalox. We'll initiate Pepcid.  Essential hypertension Currently stable, continue Coreg, Imdur, Avapro, Lasix.  PAF (paroxysmal atrial fibrillation) Rate is well-controlled. He is on Apixaban.   Chronic  combined systolic and diastolic heart failure, NYHA class 1 with mild to moderate mitral regurgitation Last 2-D echo on 1/16 with EF of 40-45%, with mild to moderate MR and anterior prolapse. Continue Lasix.  Tobacco abuse Patient counseled on nicotine cessation, continue nicotine patch  History of TIA Stable. No new focal neurological deficits  DVT prophylaxis: On anticoagulation Code Status: Full code  Family Communication: Discussed with patient and his wife Disposition Plan: Discussed with cardiology. There want to observe him for another day. They agree to take the patient on to their service.    Procedures  Cardiac cath 3/22 and 3/23   Consults   Cardiology  Medications  Scheduled Meds: . apixaban  5 mg Oral BID  . aspirin EC  81 mg Oral Daily  . carvedilol  6.25 mg Oral BID  . clopidogrel  75 mg Oral Q breakfast  . famotidine  20 mg Oral BID  . furosemide  20 mg Oral Daily  . isosorbide mononitrate  30 mg Oral Daily  . loratadine  10 mg Oral Daily  . nicotine  14 mg Transdermal Daily  . omega-3 acid ethyl esters  1 g Oral Daily  . potassium chloride  10 mEq Oral Daily  . rosuvastatin  40 mg Oral q1800   Continuous Infusions: . nitroGLYCERIN 40 mcg/min (03/04/15 1200)   PRN Meds:.acetaminophen, gi cocktail, labetalol, morphine injection, ondansetron (ZOFRAN) IV, sodium  chloride   Antibiotics   None   Subjective:   Patient has been experiencing chest discomfort and tightness since this morning. Denies any shortness of breath. After he burped he felt a little better.   Objective:   Filed Vitals:   03/04/15 2300 03/05/15 0008 03/05/15 0438 03/05/15 0826  BP: 125/55 153/72 159/69 168/64  Pulse:  76 64 81  Temp:  97.4 F (36.3 C) 97.5 F (36.4 C) 97.6 F (36.4 C)  TempSrc:  Oral Oral Oral  Resp: 21 18 20 18   Height:      Weight:  95 kg (209 lb 7 oz)    SpO2:  97% 100% 100%    Wt Readings from Last 3 Encounters:  03/05/15 95 kg (209 lb 7 oz)    02/20/15 97.523 kg (215 lb)  02/17/15 97.705 kg (215 lb 6.4 oz)     Intake/Output Summary (Last 24 hours) at 03/05/15 1057 Last data filed at 03/05/15 0848  Gross per 24 hour  Intake 2958.33 ml  Output   4950 ml  Net -1991.67 ml    Exam  General: Alert and oriented x 3, NAD  CVS: S1 S2 auscultated, no rubs, murmurs or gallops. Regular rate and rhythm.  Respiratory: Clear to auscultation bilaterally, no wheezing, rales or rhonchi  Abdomen: Soft, nontender, nondistended, + bowel sounds  Ext: no cyanosis clubbing or edema    Radiology Reports Dg Chest 2 View  03/02/2015   CLINICAL DATA:  Chest tightness and difficulty breathing  EXAM: CHEST  2 VIEW  COMPARISON:  February 20, 2015  FINDINGS: There is no edema or consolidation. The heart size and pulmonary vascularity are normal. No adenopathy. There is degenerative change in the thoracic spine.  IMPRESSION: No edema or consolidation.   Electronically Signed   By: Lowella Grip III M.D.   On: 03/02/2015 08:07    CBC  Recent Labs Lab 03/02/15 0725 03/03/15 0940 03/04/15 0403 03/05/15 0412  WBC 3.5* 4.0 13.7* 14.2*  HGB 15.7 15.0 13.8 12.6*  HCT 44.9 44.0 39.8 36.9*  PLT 136* 130* 137* 124*  MCV 91.1 93.0 90.9 92.0  MCH 31.8 31.7 31.5 31.4  MCHC 35.0 34.1 34.7 34.1  RDW 13.1 13.5 13.2 13.6    Chemistries   Recent Labs Lab 03/02/15 0725 03/03/15 0940 03/04/15 0403 03/05/15 0412  NA 133* 139 138 139  K 3.6 4.0 4.0 4.0  CL 106 108 111 111  CO2 19 24 24 22   GLUCOSE 126* 121* 204* 112*  BUN 17 15 17 21   CREATININE 1.30 1.28 1.32 1.21  CALCIUM 8.5 9.0 8.7 8.7   Coagulation profile  Recent Labs Lab 03/03/15 0940  INR 1.04   Cardiac Enzymes  Recent Labs Lab 03/02/15 1246 03/02/15 1520 03/02/15 1818  TROPONINI <0.03 <0.03 <0.03    Rodney Sass MD. Between 7am to 7pm - Pager - [phone]   03/05/2015 at 10:57 AM   [facility]  After 7pm go to [email or web address] - password [email or web address]  Call  night coverage person covering after 7pm

## 2015-03-05 NOTE — Progress Notes (Addendum)
Called to see patient with chest pain. Pt states that the pain is "the worst pain it has been all day". BP and HR stable. EKG obtained at 3:07pm showing NSR, with baseline T wave inversions leads V4-V6, 1, aVL, II, III, avF. There are no changes to suggest occlusion of one of the recent PCI vessels. Pt given morphine and SL NTG. He is very hypertensive and this may be contributing to ischemic symptoms. Will start NTG drip. If chest pain does not resolve, may need to consider re-look cath tonight.   MCALHANY,CHRISTOPHER 03/05/2015 3:11 PM

## 2015-03-05 NOTE — Progress Notes (Signed)
Pt has almost complete resolution of chest pressure on NTG drip. Will titrate NTG drip for SBP goal under 140. Will not plan re-look cath tonight.   Shakera Ebrahimi 03/05/2015 4:37 PM

## 2015-03-06 ENCOUNTER — Encounter (HOSPITAL_COMMUNITY): Payer: Self-pay | Admitting: Physician Assistant

## 2015-03-06 DIAGNOSIS — I719 Aortic aneurysm of unspecified site, without rupture: Secondary | ICD-10-CM

## 2015-03-06 DIAGNOSIS — I200 Unstable angina: Secondary | ICD-10-CM

## 2015-03-06 LAB — BASIC METABOLIC PANEL
Anion gap: 4 — ABNORMAL LOW (ref 5–15)
BUN: 16 mg/dL (ref 6–23)
CALCIUM: 8.5 mg/dL (ref 8.4–10.5)
CO2: 22 mmol/L (ref 19–32)
CREATININE: 1.21 mg/dL (ref 0.50–1.35)
Chloride: 112 mmol/L (ref 96–112)
GFR calc Af Amer: 67 mL/min — ABNORMAL LOW (ref >90)
GFR, EST NON AFRICAN AMERICAN: 58 mL/min — AB (ref >90)
GLUCOSE: 115 mg/dL — AB (ref 70–99)
POTASSIUM: 3.8 mmol/L (ref 3.5–5.1)
Sodium: 138 mmol/L (ref 135–145)

## 2015-03-06 MED ORDER — FAMOTIDINE 40 MG PO TABS: 40.0000 mg | ORAL_TABLET | Freq: Two times a day (BID) | ORAL | Status: DC

## 2015-03-06 MED ORDER — CLOPIDOGREL BISULFATE 75 MG PO TABS: 75.0000 mg | ORAL_TABLET | Freq: Every day | ORAL | Status: DC

## 2015-03-06 MED ORDER — IRBESARTAN 150 MG PO TABS
150.0000 mg | ORAL_TABLET | Freq: Every day | ORAL | Status: DC
  Administered 2015-03-06: 150 mg via ORAL
  Filled 2015-03-06: qty 1

## 2015-03-06 MED ORDER — ASPIRIN 81 MG PO TABS: 81.0000 mg | ORAL_TABLET | Freq: Every day | ORAL | Status: DC

## 2015-03-06 MED ORDER — ISOSORBIDE MONONITRATE ER 60 MG PO TB24
60.0000 mg | ORAL_TABLET | Freq: Every day | ORAL | Status: DC
  Administered 2015-03-06: 60 mg via ORAL
  Filled 2015-03-06: qty 1

## 2015-03-06 MED ORDER — ISOSORBIDE MONONITRATE ER 60 MG PO TB24: 60.0000 mg | ORAL_TABLET | Freq: Every day | ORAL | Status: DC

## 2015-03-06 NOTE — Discharge Instructions (Signed)
Take her blood pressure once a day at various times, record readings and bring to your next appointment.  PLEASE REMEMBER TO BRING ALL OF YOUR MEDICATIONS TO EACH OF YOUR FOLLOW-UP OFFICE VISITS.  PLEASE ATTEND ALL SCHEDULED FOLLOW-UP APPOINTMENTS.   Activity: Increase activity slowly as tolerated. You may shower, but no soaking baths (or swimming) for 1 week. No driving for 2 days. No lifting over 5 lbs for 1 week. No sexual activity for 1 week.   You May Return to Work: in 1 week (if applicable)  Wound Care: You may wash cath site gently with soap and water. Keep cath site clean and dry. If you notice pain, swelling, bleeding or pus at your cath site, please call [phone].    Cardiac Cath Site Care Refer to this sheet in the next few weeks. These instructions provide you with information on caring for yourself after your procedure. Your caregiver may also give you more specific instructions. Your treatment has been planned according to current medical practices, but problems sometimes occur. Call your caregiver if you have any problems or questions after your procedure. HOME CARE INSTRUCTIONS  You may shower 24 hours after the procedure. Remove the bandage (dressing) and gently wash the site with plain soap and water. Gently pat the site dry.   Do not apply powder or lotion to the site.   Do not sit in a bathtub, swimming pool, or whirlpool for 5 to 7 days.   No bending, squatting, or lifting anything over 10 pounds (4.5 kg) as directed by your caregiver.   Inspect the site at least twice daily.   Do not drive home if you are discharged the same day of the procedure. Have someone else drive you.   You may drive 24 hours after the procedure unless otherwise instructed by your caregiver.  What to expect:  Any bruising will usually fade within 1 to 2 weeks.   Blood that collects in the tissue (hematoma) may be painful to the touch. It should usually decrease in size and tenderness  within 1 to 2 weeks.  SEEK IMMEDIATE MEDICAL CARE IF:  You have unusual pain at the site or down the affected limb.   You have redness, warmth, swelling, or pain at the site.   You have drainage (other than a small amount of blood on the dressing).   You have chills.   You have a fever or persistent symptoms for more than 72 hours.   You have a fever and your symptoms suddenly get worse.   Your leg becomes pale, cool, tingly, or numb.   You have heavy bleeding from the site. Hold pressure on the site.  Document Released: 12/31/2010 Document Revised: 11/17/2011 Document Reviewed: 12/31/2010 [facility] Patient Information 2012 [place].

## 2015-03-06 NOTE — Progress Notes (Addendum)
CARDIAC REHAB PHASE I   PRE:  Rate/Rhythm: 61 SR  BP:  Supine: 195/92  Sitting:   Standing:    SaO2: 99 RA  MODE:  Ambulation: 1000 ft   POST:  Rate/Rhythm: 74 SR  BP:  Supine:   Sitting: 209/104  Standing:    SaO2:  0940-1030 Pt tolerated ambulation well without c/o of pain. BP elevated before and after walk.RN made aware. Pt back to side of bed after walk with call light in reach. Completed discharge education with pt. He voices understanding. I gave pt CHF packet. We discussed low sodium diet, fluid restrictions, daily weights, when to call MD and 911.Pt agrees to [place]. CRP in [place] will send referral.  Rodney Langton RN 03/06/2015 10:28 AM

## 2015-03-06 NOTE — Discharge Summary (Signed)
CARDIOLOGY DISCHARGE SUMMARY   Patient ID: TIRAS BIANCHINI MRN: [id number] DOB/AGE: April 02, 1941 74 y.o.  Admit date: 03/02/2015 Discharge date: 03/06/2015  PCP: [name], MD Primary Cardiologist: Dr. Debara Pickett  Primary Discharge Diagnosis:  Progressive angina - s/p BMS ramus, angiosculpt/PTCA OM 1, BMS circumflex Secondary Discharge Diagnosis:    Essential hypertension   PAF (paroxysmal atrial fibrillation)   Chronic combined systolic and diastolic heart failure, NYHA class 1   Tobacco abuse   Dyspnea   History of TIA (transient ischemic attack)   Mitral regurgitation   HX: anticoagulation   AA (aortic aneurysm)   Coronary artery disease due to lipid rich plaque   Thrombocytopenia, no bleeding issues  Procedures: Left/right cardiac catheterization, coronary arteriogram, left ventriculogram, distal aortogram, staged repeat catheterization for PCI, Ramus intermediate vessel: PTCA/Bare metal stenting; left circumflex coronary artery: Angiosculpt/ PTCA of the distal circumflex marginal branch, and Angiosculpt/Bare metal stenting of the dominant distal circumflex into the PLA vessel.  Hospital Course: ZALAN SHIDLER is a 74 y.o. male with a history of CAD. He came to the hospital with shortness of breath and was admitted for further evaluation and treatment.  His cardiac enzymes were negative for MI. He was taken to the cath lab on 03/22, results are below. Dr. Claiborne Billings felt that he needed multivessel intervention but felt that the patient should be held for 24 hours for hydration before giving him more dye. This was done. His valsartan was held.  His creatinine was followed during his hospital stay and remained stable. His white count was slightly elevated but this is felt secondary to the stress of the acute illness. His BNP had been checked on admission and was within normal limits, but his echo showed high ventricular filling pressures and grade 1 diastolic dysfunction, EF  35-40 percent.   He was taken back to the cath lab on 03/04/2015 for a relook catheterization with PCI. He had a bare-metal stent to the ramus intermedius, a bare-metal stent to the distal circumflex, and Angiosculpt PTCA to the bifurcation lesion of the circumflex/OM. He tolerated the procedure well.  On 03/05/2015, his blood pressure was running much higher than usual and he was having recurrent chest pain. He was started on IV nitroglycerin, and was continued on his home dose of carvedilol and Lasix. His creatinine was remaining stable. He was held overnight for blood pressure and pain control. There was concern that some of his symptoms were GI in origin and he was started on Pepcid. He was also given a GI cocktail when necessary which also helped.  On 03/06/2015, he was seen by Dr. Tamala Julian and all data were reviewed. As his creatinine is stable 48 hours post cath, he can be restarted on his Diovan. The IV nitroglycerin was discontinued and his isosorbide was increased from 30 mg daily up to 60 mg daily. He is to continue the carvedilol and Lasix at current doses. He was maintaining sinus rhythm and tolerating anticoagulation with Eliquis well. He is to continue the apixaban and the Plavix for 30 days, then go back to aspirin 81 mg and apixaban.  He may need additional medical therapy for blood pressure control, but says his blood pressure was running in the 267T systolic prior to admission. He is to monitor his blood pressure at home and report readings at his next office visit. He will get a TCM visit next week and is to let us know about problems or concerns in the meantime.  Labs:   Lab Results  Component Value Date   WBC 14.2* 03/05/2015   HGB 12.6* 03/05/2015   HCT 36.9* 03/05/2015   MCV 92.0 03/05/2015   PLT 124* 03/05/2015     Recent Labs Lab 03/06/15 0342  NA 138  K 3.8  CL 112  CO2 22  BUN 16  CREATININE 1.21  CALCIUM 8.5  GLUCOSE 115*   B NATRIURETIC PEPTIDE  Date/Time  Value Ref Range Status  03/02/2015 07:25 AM 86.3 0.0 - 100.0 pg/mL Final  01/03/2015 01:54 PM 539.5* 0.0 - 100.0 pg/mL Final   Radiology: Dg Chest 2 View 03/02/2015   CLINICAL DATA:  Chest tightness and difficulty breathing  EXAM: CHEST  2 VIEW  COMPARISON:  February 20, 2015  FINDINGS: There is no edema or consolidation. The heart size and pulmonary vascularity are normal. No adenopathy. There is degenerative change in the thoracic spine.  IMPRESSION: No edema or consolidation.   Electronically Signed   By: Lowella Grip III M.D.   On: 03/02/2015 08:07   Cardiac Cath: 03/03/2015 ANGIOGRAPHY:  Left main: Large caliber vessel that had mild irregularity with 10% narrowing distally prior to trifurcating into LAD, ramus intermediate, like vessel, and the left circumflex coronary artery. LAD: Large caliber vessel with mild luminal irregularities proximally with narrowing up to 30% to 40%. There was mild proximal calcification. Ramus intermediate/High OM: Moderate size vessel proximally that had 95% stenosis in the proximal third. There was another region of mid 90% stenosis.  Left circumflex: Moderate size dominant vessel that had 30% proximal stenosis after the ramus high OM vessel. Distally, the vessel supplied the PDA and PLA like vessel. There was 80% eccentric stenosis in the distal branch. The smaller distal superior branch had 70% stenosis branch  Right coronary artery: Moderate size vessel that had luminal irregularities in its midsegment. There was 20% narrowing proximal to the acute margin and 40% narrowing in the region of the small PDA takeoff. Left ventriculography revealed mild RV dilation. There was global hypocontractility with an ejection fraction of 35-40%. There did not appear to be significant angiographic mitral regurgitation. Distal aortography was performed which revealed a widely patent aortoiliac stent graft which commenced just below the renal arteries and extended to the  iliacs bilaterally. There was no evidence for graft stenosis or apparent endoleak. 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 Multivessel coronary obstructive disease with luminal irregularity and proximal LAD narrowings of 30-40%; ramus intermediate/high OM, proximal and mid stenoses of 95 and 90%; 30% narrowing in the proximal AV groove circumflex with distal 80% stenosis in the larger distal branch with 70% narrowing in the smaller branch; and luminal irregularity of the RCA with 20-40% mid and distal stenoses. Widely patent abdominal aortic iliac stent graft. RECOMMENDATION: The patient will be hydrated overnight. He was started on IV nitroglycerin in light of his elevated blood pressure. His ARB therapy will be held. Plan will be for PCI to the ramus intermediate/high OM vessel and possible distal circumflex tomorrow. The patient has a history of remote TIA and recent PAF and had been on anticoagulation therapy. Because of this bare-metal stents and clopidogrel will be utilized for the procedure.  Relook Cath: 03/04/2015 ANGIOGRAPHY: At the start of the procedure, left main was a large caliber vessel which trifurcated into the LAD, intermediate-like vessel and the left circumflex coronary artery. The ramus intermediate vessel had 60% ostial stenosis in some views eccentrically and then had a 99-100% subtotal stenosis  proximally followed by another area of 95% stenosis. There was very poor visualization beyond this. Following successful intervention to the ramus vessel with PTCA, and ultimate stenting from the ostium to the mid vessel the entire stented region was reduced to 0%. The ramus intermediate vessel is large caliber and extended to the apex and gave rise to several additional distal branches. There was 30% narrowing beyond the branch after the stented segment The left circumflex vessel was a dominant vessel that had 30%  mild proximal narrowing. In the distal aspect. There was a bifurcation stenosis of 80-90% involving a smaller distal marginal branch as well as the distal AV groove into the PDA/PLA system which was large caliber. Following complex intervention with double wire technique, Angiosculpt scoring balloon of both the ostium of the distal circumflex marginal and the the dominant distal circumflex into the PLA system, the large circumflex PLA 80% stenosis ultimately treated with a 2.7520 mm bare-metal Rebel stent postdilated with taper from 2.9. A 2.8 mm was reduced to 0%. There was brisk TIMI-3 flow. The distal circumflex marginal branch, which was jailed by the stented region and had undergone Angiosculpt scoring balloon and long PTCA with the 90% stenosis being reduced to 30-40% did have ostial re-narrowing following being jailed by the stented segment.  IMPRESSION: Difficult but successful complex intervention involving PTCA/BM stenting of a subtotal ramus intermediate vessel with the entire proximal region being reduced to 0% in an intermediate vessel which extended to the LV apex once opened; and Angiosculpt/PTCA of bifurcation stenosis involving the distal circumflex flex marginal branch and Angiosculpt/BM stenting of a dominant distal circumflex extending into the PD/PLA vessel.  EKG: 03/06/2015 Sinus rhythm, lateral and inferior ST changes, similar to 02/20/2015 ECG Vent. rate 62 BPM PR interval 176 ms QRS duration 96 ms QT/QTc 442/448 ms P-R-T axes 39 -17 241  Echo: 03/03/2015 Conclusions - Left ventricle: The cavity size was normal. Wall thickness was increased in a pattern of moderate LVH. Systolic function was moderately reduced. The estimated ejection fraction was in the range of 35% to 40%. Diffuse hypokinesis. There is mild hypokinesis of the mid-apicalanterolateral myocardium. Doppler parameters are consistent with abnormal left ventricular relaxation (grade 1 diastolic  dysfunction). Doppler parameters are consistent with high ventricular filling pressure. - Mitral valve: There was mild regurgitation. Impressions: - Global L. Strain -13.4.  FOLLOW UP PLANS AND APPOINTMENTS Allergies  Allergen Reactions  . Iohexol      Code: RASH, Desc: PAITENT STATED THAT HE BEGAN ITCHING TOWARDS END OF INJECTIN OF IV CONTRAST-- 13 HR PREP RECOMMENDED/MMS   . Lisinopril Cough     Medication List    TAKE these medications        apixaban 5 MG Tabs tablet  Commonly known as:  ELIQUIS  Take 1 tablet (5 mg total) by mouth 2 (two) times daily.     aspirin 81 MG tablet  Take 1 tablet (81 mg total) by mouth daily. Hold while on Plavix, restart after Plavix completed.     carvedilol 6.25 MG tablet  Commonly known as:  COREG  Take 1 tablet by mouth 2 (two) times daily.     clopidogrel 75 MG tablet  Commonly known as:  PLAVIX  Take 1 tablet (75 mg total) by mouth daily with breakfast.     CRESTOR 40 MG tablet  Generic drug:  rosuvastatin  Take 40 mg by mouth daily.     Dextromethorphan-Guaifenesin 10-200 MG Caps  Take 1 capsule by mouth every 6 (  six) hours as needed (for cold).     famotidine 40 MG tablet  Commonly known as:  PEPCID  Take 1 tablet (40 mg total) by mouth 2 (two) times daily.     fish oil-omega-3 fatty acids 1000 MG capsule  Take 1 g by mouth daily.     furosemide 20 MG tablet  Commonly known as:  LASIX  Take 1 tablet (20 mg total) by mouth daily.     isosorbide mononitrate 30 MG 24 hr tablet  Commonly known as:  IMDUR  Take 1 tablet (30 mg total) by mouth daily.     multivitamin capsule  Take 1 capsule by mouth daily.     nicotine 14 mg/24hr patch  Commonly known as:  NICODERM CQ - dosed in mg/24 hours  Place 1 patch (14 mg total) onto the skin daily.     potassium chloride 10 MEQ tablet  Commonly known as:  K-DUR  Take 1 tablet (10 mEq total) by mouth daily.     valsartan 160 MG tablet  Commonly known as:  DIOVAN  Take 1  tablet (160 mg total) by mouth daily.        Discharge Instructions    Amb Referral to Cardiac Rehabilitation    Complete by:  As directed      Diet - low sodium heart healthy    Complete by:  As directed      Increase activity slowly    Complete by:  As directed           Follow-up Information    Follow up with [name], MD.   Specialty:  Cardiology   Why:  The office will call.   Contact information:   [address] [phone]       BRING ALL MEDICATIONS WITH YOU TO FOLLOW UP APPOINTMENTS  Time spent with patient to include physician time: 41 min Signed: [name], PA-C 03/06/2015, 2:41 PM Co-Sign MD

## 2015-03-06 NOTE — Progress Notes (Signed)
Patient Name: Rodney [name] Date of Encounter: 03/06/2015  Principal Problem:   Progressive angina Active Problems:   Essential hypertension   PAF (paroxysmal atrial fibrillation)   Chronic combined systolic and diastolic heart failure, NYHA class 1   Tobacco abuse   Dyspnea   History of TIA (transient ischemic attack)   Mitral regurgitation   HX: anticoagulation   AA (aortic aneurysm)   Coronary artery disease due to lipid rich plaque   Primary Cardiologist: Dr. Debara Pickett  Patient Profile: 74 yo male w/ hx CAD, CHF, MR, atrial fib on Eliquis, was admitted 03/21 w/ SOB. Cath 03/22 with significant lesions in the ramus and circumflex, staged PCI with BMS to the ramus, OM PTCA, BMS CFX. Recurrent chest pain was possibly GI related, but since symptoms continued, he was started on IV nitroglycerin with improvement. Patient had recurrent chest pain, possibly related to hypertension.  SUBJECTIVE: No chest pain right now, has had intermittently in the last 24 hours.  SBP 130s at home  (Per the nursing staff, he seems more likely to have chest pain when his blood pressure is high)  OBJECTIVE Filed Vitals:   03/06/15 0300 03/06/15 0400 03/06/15 0500 03/06/15 0731  BP: 126/59 159/76 160/87 164/82  Pulse:  60  65  Temp:  98.6 F (37 C)  97.7 F (36.5 C)  TempSrc:  Oral  Oral  Resp:  18  18  Height:      Weight:      SpO2:  95%  96%    Intake/Output Summary (Last 24 hours) at 03/06/15 0848 Last data filed at 03/06/15 0741  Gross per 24 hour  Intake   1028 ml  Output    750 ml  Net    278 ml   Filed Weights   03/03/15 2022 03/04/15 0000 03/05/15 0008  Weight: 208 lb 15.9 oz (94.8 kg) 208 lb 15.9 oz (94.8 kg) 209 lb 7 oz (95 kg)    PHYSICAL EXAM General: Well developed, well nourished, male in no acute distress. Head: Normocephalic, atraumatic.  Neck: Supple without bruits, JVD not elevated. Lungs:  Resp regular and unlabored, Rales bases Heart: RRR, S1, S2, no S3, S4,  soft murmur; no rub. Abdomen: Soft, non-tender, non-distended, BS + x 4.  Extremities: No clubbing, cyanosis, no edema. Right groin has ecchymosis, but no hematoma or bruit Neuro: Alert and oriented X 3. Moves all extremities spontaneously. Psych: Normal affect.  LABS: CBC:  Recent Labs  03/04/15 0403 03/05/15 0412  WBC 13.7* 14.2*  HGB 13.8 12.6*  HCT 39.8 36.9*  MCV 90.9 92.0  PLT 137* 124*   INR:  Recent Labs  03/03/15 0940  INR 6.26   Basic Metabolic Panel:  Recent Labs  03/05/15 0412 03/06/15 0342  NA 139 138  K 4.0 3.8  CL 111 112  CO2 22 22  GLUCOSE 112* 115*  BUN 21 16  CREATININE 1.21 1.21  CALCIUM 8.7 8.5   TELE: Sinus rhythm with PVCs and pairs, sinus bradycardia at times      Current Medications:  . apixaban  5 mg Oral BID  . carvedilol  6.25 mg Oral BID  . clopidogrel  75 mg Oral Q breakfast  . famotidine  20 mg Oral BID  . furosemide  20 mg Oral Daily  . isosorbide mononitrate  30 mg Oral Daily  . loratadine  10 mg Oral Daily  . nicotine  14 mg Transdermal Daily  . omega-3 acid ethyl esters  1 g Oral Daily  . potassium chloride  10 mEq Oral Daily  . rosuvastatin  40 mg Oral q1800   . sodium chloride 10 mL/hr at 03/06/15 0700  . nitroGLYCERIN 10 mcg/min (03/06/15 0700)    ASSESSMENT AND PLAN: Principal Problem:   Progressive Angina S/p BMS ramus, PTCA OM 1, BMS circumflex Plavix times one month, no aspirin due to apixaban. Continue beta blocker, Imdur, and Crestor  Active Problems:   Essential hypertension Coreg and Lasix are preadmission medications, he was also on Diovan 160 mg daily Has been 48 hours since cath, so will restart Diovan. Patient states blood pressures at home run 505W systolic at rest. M.D. advise on adding hydralazine versus Norvasc for improved blood pressure control as I suspect his blood pressure runs higher than he realizes most of the time Other option is to restart the Diovan, have the patient follow his  blood pressures more frequently at home and communicate the readings to [place].    PAF (paroxysmal atrial fibrillation) Maintaining sinus rhythm with PVCs, continue beta blocker    Chronic combined systolic and diastolic heart failure, NYHA class 1 Volume status is good right now, continue by mouth Lasix    Tobacco abuse Cessation encouraged, continue nicotine patch    History of TIA (transient ischemic attack) Follow, continue Eliquis    Mitral regurgitation Follow    HX: anticoagulation Continue Eliquis    AA (aortic aneurysm) Follow    Coronary artery disease due to lipid rich plaque See above  Plan: Discharge today  Jonetta Speak , PA-C 8:48 AM 03/06/2015

## 2015-03-07 ENCOUNTER — Emergency Department (HOSPITAL_COMMUNITY): Payer: Medicare Other

## 2015-03-07 ENCOUNTER — Observation Stay (HOSPITAL_COMMUNITY)
Admission: EM | Admit: 2015-03-07 | Discharge: 2015-03-09 | Disposition: A | Discharge status: Home / Self Care | Payer: Medicare Other | Attending: Cardiovascular Disease | Admitting: Cardiovascular Disease

## 2015-03-07 ENCOUNTER — Encounter (HOSPITAL_COMMUNITY): Payer: Self-pay | Admitting: Emergency Medicine

## 2015-03-07 DIAGNOSIS — R079 Chest pain, unspecified: Secondary | ICD-10-CM | POA: Diagnosis not present

## 2015-03-07 DIAGNOSIS — E785 Hyperlipidemia, unspecified: Secondary | ICD-10-CM | POA: Diagnosis not present

## 2015-03-07 DIAGNOSIS — I5020 Unspecified systolic (congestive) heart failure: Secondary | ICD-10-CM | POA: Diagnosis not present

## 2015-03-07 DIAGNOSIS — Z9861 Coronary angioplasty status: Secondary | ICD-10-CM | POA: Insufficient documentation

## 2015-03-07 DIAGNOSIS — Z720 Tobacco use: Secondary | ICD-10-CM | POA: Diagnosis not present

## 2015-03-07 DIAGNOSIS — M1990 Unspecified osteoarthritis, unspecified site: Secondary | ICD-10-CM | POA: Insufficient documentation

## 2015-03-07 DIAGNOSIS — I480 Paroxysmal atrial fibrillation: Secondary | ICD-10-CM | POA: Diagnosis not present

## 2015-03-07 DIAGNOSIS — I2510 Atherosclerotic heart disease of native coronary artery without angina pectoris: Secondary | ICD-10-CM | POA: Insufficient documentation

## 2015-03-07 DIAGNOSIS — I714 Abdominal aortic aneurysm, without rupture: Secondary | ICD-10-CM | POA: Diagnosis not present

## 2015-03-07 DIAGNOSIS — I10 Essential (primary) hypertension: Secondary | ICD-10-CM | POA: Diagnosis not present

## 2015-03-07 DIAGNOSIS — Z7902 Long term (current) use of antithrombotics/antiplatelets: Secondary | ICD-10-CM | POA: Insufficient documentation

## 2015-03-07 DIAGNOSIS — I160 Hypertensive urgency: Secondary | ICD-10-CM | POA: Diagnosis present

## 2015-03-07 DIAGNOSIS — Z7982 Long term (current) use of aspirin: Secondary | ICD-10-CM | POA: Diagnosis not present

## 2015-03-07 DIAGNOSIS — M109 Gout, unspecified: Secondary | ICD-10-CM | POA: Insufficient documentation

## 2015-03-07 DIAGNOSIS — Z79899 Other long term (current) drug therapy: Secondary | ICD-10-CM | POA: Insufficient documentation

## 2015-03-07 LAB — BRAIN NATRIURETIC PEPTIDE: B Natriuretic Peptide: 125.6 pg/mL — ABNORMAL HIGH (ref 0.0–100.0)

## 2015-03-07 LAB — BASIC METABOLIC PANEL
Anion gap: 8 (ref 5–15)
BUN: 16 mg/dL (ref 6–23)
CALCIUM: 9.3 mg/dL (ref 8.4–10.5)
CO2: 23 mmol/L (ref 19–32)
CREATININE: 1.24 mg/dL (ref 0.50–1.35)
Chloride: 105 mmol/L (ref 96–112)
GFR calc non Af Amer: 56 mL/min — ABNORMAL LOW (ref >90)
GFR, EST AFRICAN AMERICAN: 65 mL/min — AB (ref >90)
GLUCOSE: 127 mg/dL — AB (ref 70–99)
Potassium: 3.8 mmol/L (ref 3.5–5.1)
SODIUM: 136 mmol/L (ref 135–145)

## 2015-03-07 LAB — CBC
HCT: 40.8 % (ref 39.0–52.0)
Hemoglobin: 14.3 g/dL (ref 13.0–17.0)
MCH: 31.8 pg (ref 26.0–34.0)
MCHC: 35 g/dL (ref 30.0–36.0)
MCV: 90.9 fL (ref 78.0–100.0)
Platelets: 124 10*3/uL — ABNORMAL LOW (ref 150–400)
RBC: 4.49 MIL/uL (ref 4.22–5.81)
RDW: 13.4 % (ref 11.5–15.5)
WBC: 4.1 10*3/uL (ref 4.0–10.5)

## 2015-03-07 LAB — I-STAT TROPONIN, ED: TROPONIN I, POC: 0.14 ng/mL — AB (ref 0.00–0.08)

## 2015-03-07 LAB — PROTIME-INR
INR: 1.14 (ref 0.00–1.49)
PROTHROMBIN TIME: 14.7 s (ref 11.6–15.2)

## 2015-03-07 MED ORDER — ASPIRIN 325 MG PO TABS
325.0000 mg | ORAL_TABLET | Freq: Once | ORAL | Status: AC
  Administered 2015-03-07: 325 mg via ORAL
  Filled 2015-03-07: qty 1

## 2015-03-07 MED ORDER — ACETAMINOPHEN 500 MG PO TABS
1000.0000 mg | ORAL_TABLET | Freq: Once | ORAL | Status: DC
  Filled 2015-03-07: qty 2

## 2015-03-07 MED ORDER — NITROGLYCERIN 0.4 MG SL SUBL
0.4000 mg | SUBLINGUAL_TABLET | SUBLINGUAL | Status: DC | PRN
  Administered 2015-03-07: 0.4 mg via SUBLINGUAL
  Filled 2015-03-07: qty 1

## 2015-03-07 NOTE — ED Notes (Signed)
Pt taken to xray 

## 2015-03-07 NOTE — ED Notes (Signed)
Elevated troponin reported to Dr. Claudine Mouton

## 2015-03-07 NOTE — ED Notes (Signed)
Patient refused ordered tylenol.  States the headache is from the nitro and I can handle it.

## 2015-03-07 NOTE — ED Notes (Signed)
Pt recently discharged from hospital with stent placement- admits to checking BP often at home.  Tonight pt BP elevated 256 systolic prior to arrival, pt began having chest pressure upon arrival to exam room.  Denies SOB or N/V.  Respirations e/u.

## 2015-03-07 NOTE — Consult Note (Signed)
CARDIOLOGY ADMISSION/CONSULT NOTE  Assessment and Plan:  *Hypertensive urgency: Rodney [name] is a pleasant 74 year old male with history of hypertension and is currently on carvedilol 6.25 mg by mouth twice a day, valsartan 160 mg daily, Lasix 20 mg daily, Imdur 60 mg daily. His blood pressure continues to be elevated in the emergency department -- Continue carvedilol at 6.25 mg by mouth twice a day dose due to heart rate of 60 in the ED -- Increase valsartan to 320 mg daily -- Continue Lasix 20 mg daily and Imdur 60 mg daily  *Systolic heart failure LVEF 35-40%: Ischemic cardiomyopathy LVEF 35-40% status post PCI to the ramus and left circumflex artery. NYHA class 1. -- Continue carvedilol, valsartan, Lasix. -- Patient may benefit from hydralazine and isosorbide combination due to his race. -- Appears to be euvolemic  *CAD s/p PCI to ramus and LCX: Chest pressure in the emergency department is likely secondary to hypertensive urgency versus recent PCI. No other evidence of obstructive coronary disease. Chest pressure very atypical to be primarily from obstructive lesions as symptoms are not occurring with exertion. -- Continue aspirin 81 mg daily -- Continue Plavix 75 g daily -- Continue carvedilol 6.25 mg twice a day -- Continue Crestor 40 mg daily  *Atrial fibrillation -- Continue eliquis and carvedilol   Chief complaint: chest pressure   HPI:  Mr. Rodney [name] is a 74 year old male with history of hypertension, CAD status post recent PCI to left circumflex and ramus, systolic cardiomyopathy LVEF 35-40%, peripheral arterial disease status post aorta iliac stenting comes to the emergency department after having elevated blood pressures. Patient was discharged from the hospital yesterday after receiving PCI to left circumflex and ramus arteries. During the hospitalization, patient was noted to have elevated blood pressures. In addition to carvedilol 6.25 mg by mouth twice a day, Imdur 60 mg,  Lasix 20 mg patient was started on valsartan 160 mg daily at discharge. He was informed to come to the hospital with systolic blood pressures greater than 200. This evening, patient noticed that his blood pressure was 210/110. It is to come to the hospital for evaluation this evening. After coming to the hospital, patient noticed mild chest pressure without any shortness of breath, diaphoresis orthopnea or dyspnea on exertion. He also has not noticed any lower extreme edema. He is very compliant with his medications and take some precisely every day. He denies any side effects from his medications.  Cardiac history:  Hypertension  Dyslipidemia  CAD status post PCI to left circumflex, ramus  Systolic heart failure LVEF 35-40%  Peripheral arterial disease status post aorta iliac stenting  Atrial fibrillation Previous cardiac imaging  EKG: Normal sinus rhythm with ST-T wave changes consider inferolateral ischemia  TTE: LVEF 35-40, moderate LVH, mild MR  NM Stress test: High risk stress nuclear study due to severely decreased LV function. There is a moderate inferior wall scar. No reversible ischemia is seen.. Consider multivessel CAD versus adverse post-infarction remodeling as cause of worsening cardiomyopathy.  Prior cath:  03/03/2015: Dilated cardiomyopathy with an ejection fraction of 35-40% with global hypocontractility. Multivessel coronary obstructive disease with luminal irregularity and proximal LAD narrowings of 30-40%; ramus intermediate/high OM, proximal and mid stenoses of 95 and 90%; 30% narrowing in the proximal AV groove circumflex with distal 80% stenosis in the larger distal branch with 70% narrowing in the smaller branch; and luminal irregularity of the RCA with 20-40% mid and distal stenoses. Widely patent abdominal aortic iliac stent graft.  Past Medical  History Past Medical History  Diagnosis Date  . Hypertension   . Hyperlipidemia   . Gout   . CAD (coronary  artery disease)     cath 2010  . AAA (abdominal aortic aneurysm)     stent graft put in 02/2009  . Arthritis   . Paroxysmal atrial fibrillation   . Tobacco abuse     Allergies: Allergies  Allergen Reactions  . Iohexol      Code: RASH, Desc: PAITENT STATED THAT HE BEGAN ITCHING TOWARDS END OF INJECTIN OF IV CONTRAST-- 13 HR PREP RECOMMENDED/MMS   . Lisinopril Cough   Medications: Aspirin 81 mg daily Apixaban 5 mg by mouth twice a day  Carvedilol 6.25 mg by mouth twice a day Plavix 75 mg daily Crestor 40 mg daily Famotidine 40 mg twice a day Furosemide 20 mg daily Imdur 60 mg daily Valsartan 160 mg daily  Social History History   Social History  . Marital Status: Married    Spouse Name: N/A  . Number of Children: N/A  . Years of Education: N/A   Occupational History  . Not on file.   Social History Main Topics  . Smoking status: Current Every Day Smoker -- 0.50 packs/day for 50 years    Types: Cigarettes  . Smokeless tobacco: Never Used  . Alcohol Use: 4.2 - 4.8 oz/week    6 Cans of beer, 1-2 Shots of liquor per week     Comment: couple shots of royal per day  . Drug Use: No  . Sexual Activity: Not Currently     Comment: cutting down 1/2 ppd   Other Topics Concern  . Not on file   Social History Narrative   Lives with wife, does not use assist, drives.  No home health services.      Family History Family History  Problem Relation Age of Onset  . Diabetes Sister   . Hypertension Sister   . Cancer Father     prostate  . Hypertension Mother   . Stroke Neg Hx   . Heart attack Neg Hx     Physical Exam Filed Vitals:   03/07/15 2322  BP: 182/90  Pulse: 64  Temp:   Resp: 22    Gen: Comfortable appearing no acute distress HEENT: Extra motions intact, moist mucous membranes Neck: Elevated JVP CV: Regular rate rhythm, normal S1, normal S2, no obvious murmurs, +2 pulses in bilateral upper extremities Pulm: Clear to auscultation bilaterally Abdomen:  Soft nontender nondistended Ext: No cyanosis clubbing or edema, right groin cath site is well-healed without any hematoma  Labs:  Results for orders placed or performed during the hospital encounter of 03/07/15 (from the past 24 hour(s))  CBC     Status: Abnormal   Collection Time: 03/07/15 10:31 PM  Result Value Ref Range   WBC 4.1 4.0 - 10.5 K/uL   RBC 4.49 4.22 - 5.81 MIL/uL   Hemoglobin 14.3 13.0 - 17.0 g/dL   HCT 40.8 39.0 - 52.0 %   MCV 90.9 78.0 - 100.0 fL   MCH 31.8 26.0 - 34.0 pg   MCHC 35.0 30.0 - 36.0 g/dL   RDW 13.4 11.5 - 15.5 %   Platelets 124 (L) 150 - 400 K/uL  Protime-INR (if pt is taking Coumadin)     Status: None   Collection Time: 03/07/15 10:31 PM  Result Value Ref Range   Prothrombin Time 14.7 11.6 - 15.2 seconds   INR 1.14 0.00 - 1.49  I-stat troponin, ED (  not at [facility])     Status: Abnormal   Collection Time: 03/07/15 10:43 PM  Result Value Ref Range   Troponin i, poc 0.14 (HH) 0.00 - 0.08 ng/mL   Comment NOTIFIED PHYSICIAN    Comment 3

## 2015-03-07 NOTE — ED Provider Notes (Signed)
CSN: [id number]     Arrival date & time 03/07/15  2211 History  This chart was scribed for Rodney Balls, MD by Eustaquio Maize, ED Scribe. This patient was seen in room B19C/B19C and the patient's care was started at 11:03 PM.    Chief Complaint  Patient presents with  . Hypertension  . Chest Pain   The history is provided by the patient and the spouse. No language interpreter was used.    HPI Comments: Rodney [name] is a 74 y.o. male with PMHx HTN, HLD, CAD who presents to the Emergency Department complaining of hypertension that began earlier tonight. Pt was released from the hospital 1 day ago after having chest pain and getting stents placed. He states that he was told that if his BP increased to 200 that he should come to the ED. Pt's spouse states that pt's BP was 200/116 mmHg. Pt also complains of chest discomfort. He describes it as a tightness in his chest. He states that he had similar symptoms when he was admitted but that it was worse then. Pt denies shortness of breath, nausea, vomiting, or any other symptoms. Pt is currently on Eliquis, Plavix, and 81 mg Aspirin.    Cardiologist - [facility]   Past Medical History  Diagnosis Date  . Hypertension   . Hyperlipidemia   . Gout   . CAD (coronary artery disease)     cath 2010  . AAA (abdominal aortic aneurysm)     stent graft put in 02/2009  . Arthritis   . Paroxysmal atrial fibrillation   . Tobacco abuse    Past Surgical History  Procedure Laterality Date  . Abdominal aortic aneurysm repair  02/19/2009    performed by [facility]  . Colonoscopy    . Knee arthroscopy  2003    left  . Shoulder arthroscopy with rotator cuff repair and subacromial decompression Left 11/19/2013    Procedure: LEFT SHOULDER ARTHROSCOPY DEBRIDEMENT EXTENTSIVE DISTAL CLAVICULECTOMY DECOMPRESSION PARTIAL ACROMIOPLASTY WITH CORACOACROMIAL WITH ROTATOR CUFF REPAIR ;  Surgeon: Renette Butters, MD;  Location: [facility];  Service: Orthopedics;   Laterality: Left;  . Left and right heart catheterization with coronary angiogram N/A 03/03/2015    Procedure: LEFT AND RIGHT HEART CATHETERIZATION WITH CORONARY ANGIOGRAM;  Surgeon: Troy Sine, MD;    . Percutaneous coronary stent intervention (pci-s) N/A 03/04/2015    Procedure: PERCUTANEOUS CORONARY STENT INTERVENTION (PCI-S);  Surgeon: Troy Sine, MD; RI 99>>0% w/   BMS, bifurcation CFX-OM 90>>40% w/ Angiosculpt PTCA; dCFX 90>>0% w/   2.7520 mm Rebel BMS   Family History  Problem Relation Age of Onset  . Diabetes Sister   . Hypertension Sister   . Cancer Father     prostate  . Hypertension Mother   . Stroke Neg Hx   . Heart attack Neg Hx    History  Substance Use Topics  . Smoking status: Current Every Day Smoker -- 0.50 packs/day for 50 years    Types: Cigarettes  . Smokeless tobacco: Never Used  . Alcohol Use: 4.2 - 4.8 oz/week    6 Cans of beer, 1-2 Shots of liquor per week     Comment: couple shots of royal per day    Review of Systems  10 Systems reviewed and all are negative for acute change except as noted in the HPI.    Allergies  Iohexol and Lisinopril  Home Medications   Prior to Admission medications   Medication Sig Start Date  End Date Taking? Authorizing Provider  apixaban (ELIQUIS) 5 MG TABS tablet Take 1 tablet (5 mg total) by mouth 2 (two) times daily. 01/29/15   [name], MD  aspirin 81 MG tablet Take 1 tablet (81 mg total) by mouth daily. Hold while on Plavix, restart after Plavix completed. 03/06/15   Rhonda G Barrett, PA-C  carvedilol (COREG) 6.25 MG tablet Take 1 tablet by mouth 2 (two) times daily. 01/25/15   Historical Provider, MD  clopidogrel (PLAVIX) 75 MG tablet Take 1 tablet (75 mg total) by mouth daily with breakfast. 03/06/15   Evelene Croon Barrett, PA-C  CRESTOR 40 MG tablet Take 40 mg by mouth daily. 11/05/14   Historical Provider, MD  Dextromethorphan-Guaifenesin 10-200 MG CAPS Take 1 capsule by mouth every 6 (six) hours as needed  (for cold).     Historical Provider, MD  famotidine (PEPCID) 40 MG tablet Take 1 tablet (40 mg total) by mouth 2 (two) times daily. 03/06/15   Rhonda G Barrett, PA-C  fish oil-omega-3 fatty acids 1000 MG capsule Take 1 g by mouth daily.     Historical Provider, MD  furosemide (LASIX) 20 MG tablet Take 1 tablet (20 mg total) by mouth daily. 01/05/15   Robbie Lis, MD  isosorbide mononitrate (IMDUR) 60 MG 24 hr tablet Take 1 tablet (60 mg total) by mouth daily. 03/06/15   Evelene Croon Barrett, PA-C  Multiple Vitamin (MULTIVITAMIN) capsule Take 1 capsule by mouth daily.    Historical Provider, MD  nicotine (NICODERM CQ - DOSED IN MG/24 HOURS) 14 mg/24hr patch Place 1 patch (14 mg total) onto the skin daily. 02/21/15   Barton Dubois, MD  potassium chloride (K-DUR) 10 MEQ tablet Take 1 tablet (10 mEq total) by mouth daily. 01/05/15   Robbie Lis, MD  valsartan (DIOVAN) 160 MG tablet Take 1 tablet (160 mg total) by mouth daily. 02/11/15   [name], MD   Triage Vitals: BP 193/98 mmHg  Pulse 68  Temp(Src) 97.6 F (36.4 C)  Resp 17  Ht 6\' 2"  (1.88 m)  Wt 210 lb (95.255 kg)  BMI 26.95 kg/m2  SpO2 97%   Physical Exam  Constitutional: He is oriented to person, place, and time. Vital signs are normal. He appears well-developed and well-nourished.  Non-toxic appearance. He does not appear ill. No distress.  HENT:  Head: Normocephalic and atraumatic.  Nose: Nose normal.  Mouth/Throat: Oropharynx is clear and moist. No oropharyngeal exudate.  Eyes: Conjunctivae and EOM are normal. Pupils are equal, round, and reactive to light. No scleral icterus.  Neck: Normal range of motion. Neck supple. No tracheal deviation, no edema, no erythema and normal range of motion present. No thyroid mass and no thyromegaly present.  Cardiovascular: Normal rate, regular rhythm, S1 normal, S2 normal, normal heart sounds, intact distal pulses and normal pulses.  Exam reveals no gallop and no friction rub.   No murmur  heard. Pulses:      Radial pulses are 2+ on the right side, and 2+ on the left side.       Dorsalis pedis pulses are 2+ on the right side, and 2+ on the left side.  Pulmonary/Chest: Effort normal and breath sounds normal. No respiratory distress. He has no wheezes. He has no rhonchi. He has no rales.  Abdominal: Soft. Normal appearance and bowel sounds are normal. He exhibits no distension, no ascites and no mass. There is no hepatosplenomegaly. There is no tenderness. There is no rebound, no guarding  and no CVA tenderness.  Musculoskeletal: Normal range of motion. He exhibits no edema or tenderness.  Lymphadenopathy:    He has no cervical adenopathy.  Neurological: He is alert and oriented to person, place, and time. He has normal strength. No cranial nerve deficit or sensory deficit.  Skin: Skin is warm, dry and intact. No petechiae and no rash noted. He is not diaphoretic. No erythema. No pallor.  Psychiatric: He has a normal mood and affect. His behavior is normal. Judgment normal.  Nursing note and vitals reviewed.   ED Course  Procedures (including critical care time)  DIAGNOSTIC STUDIES: Oxygen Saturation is 97% on RA, normal by my interpretation.    COORDINATION OF CARE: 11:07 PM-Discussed treatment plan which includes consult with cardiologist with pt at bedside and pt agreed to plan.   Labs Review Labs Reviewed  I-STAT TROPOININ, ED - Abnormal; Notable for the following:    Troponin i, poc 0.14 (*)    All other components within normal limits  CBC  BASIC METABOLIC PANEL  BRAIN NATRIURETIC PEPTIDE  PROTIME-INR    Imaging Review Dg Chest 2 View  03/07/2015   CLINICAL DATA:  Hypertension, chest pain.  EXAM: CHEST  2 VIEW  COMPARISON:  03/02/2015  FINDINGS: Minimal bibasilar atelectasis. Heart is normal size. Mild tortuosity of the thoracic aorta. No effusions. No acute bony abnormality.  IMPRESSION: Bibasilar atelectasis.   Electronically Signed   By: Rolm Baptise M.D.    On: 03/07/2015 22:59     EKG Interpretation   Date/Time:  Saturday March 07 2015 22:15:55 EDT Ventricular Rate:  71 PR Interval:  168 QRS Duration: 92 QT Interval:  414 QTC Calculation: 449 R Axis:   6 Text Interpretation:  Normal sinus rhythm ST \\T \ T wave abnormality,  consider inferolateral ischemia Abnormal ECG Confirmed by Glynn Octave [phone]) on 03/07/2015 10:57:42 PM      MDM   Final diagnoses:  None   patient since emergency department for elevated blood pressure readings at home above 703 systolic. He was told to come back to emergency department if this occurs. He does also still have mild chest pressure, although he states his last admission his chest pressure was worse and with shortness of breath. Troponin is elevated at 0.14, this is difficult to interpret given recent cardiac stent placement. Patient was given full dose aspirin and nitroglycerin emergency department. He'll require admission for continued management. Cardiology has been paged.  Cardiology is evaluated this patient and decided to admit for continued management of hypertension and chest pain.  I personally performed the services described in this documentation, which was scribed in my presence. The recorded information has been reviewed and is accurate.     Rodney Balls, MD 03/08/15 [phone]

## 2015-03-08 DIAGNOSIS — R079 Chest pain, unspecified: Secondary | ICD-10-CM | POA: Diagnosis not present

## 2015-03-08 DIAGNOSIS — I4891 Unspecified atrial fibrillation: Secondary | ICD-10-CM

## 2015-03-08 DIAGNOSIS — I5022 Chronic systolic (congestive) heart failure: Secondary | ICD-10-CM

## 2015-03-08 DIAGNOSIS — I2510 Atherosclerotic heart disease of native coronary artery without angina pectoris: Secondary | ICD-10-CM | POA: Diagnosis not present

## 2015-03-08 DIAGNOSIS — I160 Hypertensive urgency: Secondary | ICD-10-CM | POA: Diagnosis present

## 2015-03-08 DIAGNOSIS — I25118 Atherosclerotic heart disease of native coronary artery with other forms of angina pectoris: Secondary | ICD-10-CM | POA: Diagnosis not present

## 2015-03-08 DIAGNOSIS — E785 Hyperlipidemia, unspecified: Secondary | ICD-10-CM | POA: Diagnosis not present

## 2015-03-08 DIAGNOSIS — I10 Essential (primary) hypertension: Secondary | ICD-10-CM

## 2015-03-08 DIAGNOSIS — Z955 Presence of coronary angioplasty implant and graft: Secondary | ICD-10-CM

## 2015-03-08 MED ORDER — IRBESARTAN 150 MG PO TABS
150.0000 mg | ORAL_TABLET | Freq: Once | ORAL | Status: AC
  Administered 2015-03-08: 150 mg via ORAL
  Filled 2015-03-08: qty 1

## 2015-03-08 MED ORDER — CARVEDILOL 6.25 MG PO TABS
6.2500 mg | ORAL_TABLET | ORAL | Status: AC
  Administered 2015-03-08: 6.25 mg via ORAL
  Filled 2015-03-08: qty 1

## 2015-03-08 MED ORDER — CARVEDILOL 12.5 MG PO TABS
12.5000 mg | ORAL_TABLET | Freq: Two times a day (BID) | ORAL | Status: DC
  Administered 2015-03-08 – 2015-03-09 (×2): 12.5 mg via ORAL
  Filled 2015-03-08 (×2): qty 1

## 2015-03-08 MED ORDER — ASPIRIN 81 MG PO CHEW
81.0000 mg | CHEWABLE_TABLET | Freq: Every day | ORAL | Status: DC
  Administered 2015-03-08 – 2015-03-09 (×2): 81 mg via ORAL
  Filled 2015-03-08 (×4): qty 1

## 2015-03-08 MED ORDER — ADULT MULTIVITAMIN W/MINERALS CH
1.0000 | ORAL_TABLET | Freq: Every day | ORAL | Status: DC
  Administered 2015-03-08 – 2015-03-09 (×2): 1 via ORAL
  Filled 2015-03-08 (×2): qty 1

## 2015-03-08 MED ORDER — POTASSIUM CHLORIDE ER 10 MEQ PO TBCR
10.0000 meq | EXTENDED_RELEASE_TABLET | Freq: Every day | ORAL | Status: DC
  Administered 2015-03-08 – 2015-03-09 (×2): 10 meq via ORAL
  Filled 2015-03-08 (×4): qty 1

## 2015-03-08 MED ORDER — APIXABAN 5 MG PO TABS
5.0000 mg | ORAL_TABLET | Freq: Two times a day (BID) | ORAL | Status: DC
Start: 2015-03-08 — End: 2015-03-09
  Administered 2015-03-08 – 2015-03-09 (×3): 5 mg via ORAL
  Filled 2015-03-08 (×3): qty 1

## 2015-03-08 MED ORDER — FAMOTIDINE 20 MG PO TABS
40.0000 mg | ORAL_TABLET | Freq: Two times a day (BID) | ORAL | Status: DC
  Administered 2015-03-08 – 2015-03-09 (×3): 40 mg via ORAL
  Filled 2015-03-08 (×3): qty 2

## 2015-03-08 MED ORDER — IRBESARTAN 150 MG PO TABS
300.0000 mg | ORAL_TABLET | Freq: Every day | ORAL | Status: DC
  Administered 2015-03-08 – 2015-03-09 (×2): 300 mg via ORAL
  Filled 2015-03-08 (×2): qty 2

## 2015-03-08 MED ORDER — FUROSEMIDE 20 MG PO TABS
20.0000 mg | ORAL_TABLET | Freq: Every day | ORAL | Status: DC
Start: 2015-03-08 — End: 2015-03-09
  Administered 2015-03-08 – 2015-03-09 (×2): 20 mg via ORAL
  Filled 2015-03-08 (×2): qty 1

## 2015-03-08 MED ORDER — OMEGA-3-ACID ETHYL ESTERS 1 G PO CAPS
1.0000 g | ORAL_CAPSULE | Freq: Every day | ORAL | Status: DC
  Administered 2015-03-08 – 2015-03-09 (×2): 1 g via ORAL
  Filled 2015-03-08 (×4): qty 1

## 2015-03-08 MED ORDER — ROSUVASTATIN CALCIUM 10 MG PO TABS
40.0000 mg | ORAL_TABLET | Freq: Every day | ORAL | Status: DC
  Administered 2015-03-08 – 2015-03-09 (×2): 40 mg via ORAL
  Filled 2015-03-08 (×2): qty 4

## 2015-03-08 MED ORDER — NICOTINE 14 MG/24HR TD PT24
14.0000 mg | MEDICATED_PATCH | Freq: Every day | TRANSDERMAL | Status: DC
  Administered 2015-03-08 – 2015-03-09 (×2): 14 mg via TRANSDERMAL
  Filled 2015-03-08 (×2): qty 1

## 2015-03-08 MED ORDER — ISOSORBIDE MONONITRATE ER 60 MG PO TB24
60.0000 mg | ORAL_TABLET | Freq: Every day | ORAL | Status: DC
  Administered 2015-03-08 – 2015-03-09 (×2): 60 mg via ORAL
  Filled 2015-03-08 (×2): qty 1

## 2015-03-08 MED ORDER — NITROGLYCERIN 0.4 MG SL SUBL
SUBLINGUAL_TABLET | SUBLINGUAL | Status: AC
  Administered 2015-03-08: 0.4 mg
  Filled 2015-03-08: qty 1

## 2015-03-08 MED ORDER — CLOPIDOGREL BISULFATE 75 MG PO TABS
75.0000 mg | ORAL_TABLET | Freq: Every day | ORAL | Status: DC
  Administered 2015-03-08 – 2015-03-09 (×2): 75 mg via ORAL
  Filled 2015-03-08 (×2): qty 1

## 2015-03-08 MED ORDER — CARVEDILOL 6.25 MG PO TABS
6.2500 mg | ORAL_TABLET | Freq: Two times a day (BID) | ORAL | Status: DC
  Administered 2015-03-08: 6.25 mg via ORAL
  Filled 2015-03-08: qty 1

## 2015-03-08 NOTE — Progress Notes (Signed)
Pt c/o of feeling "sluggish" since he started BB, states it has gotten worse today since we gave him increased dose.

## 2015-03-08 NOTE — Progress Notes (Signed)
Pt has had 2 5 beat runs of VT on tele.  Pt asymptomatic with stable VS.  K 3.8.  MD paged, awaiting return call.  Will continue to monitor.

## 2015-03-08 NOTE — Progress Notes (Signed)
SUBJECTIVE: Pt complains of chest pain. Denies belching/gas. ECG performed this morning at 9:47 without new changes from baseline abnormalities. Markedly hypertensive. ARB dose increased by fellow last night.    No intake or output data in the 24 hours ending 03/08/15 1004  Current Facility-Administered Medications  Medication Dose Route Frequency Provider Last Rate Last Dose  . acetaminophen (TYLENOL) tablet 1,000 mg  1,000 mg Oral Once Everlene Balls, MD   1,000 mg at 03/07/15 2333  . apixaban (ELIQUIS) tablet 5 mg  5 mg Oral BID Janora Norlander, MD   5 mg at 03/08/15 0230  . aspirin chewable tablet 81 mg  81 mg Oral Daily Janora Norlander, MD      . carvedilol (COREG) tablet 12.5 mg  12.5 mg Oral BID WC Herminio Commons, MD      . carvedilol (COREG) tablet 6.25 mg  6.25 mg Oral NOW Herminio Commons, MD      . clopidogrel (PLAVIX) tablet 75 mg  75 mg Oral Q breakfast Janora Norlander, MD   75 mg at 03/08/15 0827  . famotidine (PEPCID) tablet 40 mg  40 mg Oral BID Janora Norlander, MD   40 mg at 03/08/15 0230  . furosemide (LASIX) tablet 20 mg  20 mg Oral Daily Janora Norlander, MD      . irbesartan (AVAPRO) tablet 300 mg  300 mg Oral Daily Janora Norlander, MD      . isosorbide mononitrate (IMDUR) 24 hr tablet 60 mg  60 mg Oral Daily Janora Norlander, MD      . multivitamin with minerals tablet 1 tablet  1 tablet Oral Daily Janora Norlander, MD      . nicotine (NICODERM CQ - dosed in mg/24 hours) patch 14 mg  14 mg Transdermal Daily Janora Norlander, MD      . nitroGLYCERIN (NITROSTAT) 0.4 MG SL tablet           . nitroGLYCERIN (NITROSTAT) SL tablet 0.4 mg  0.4 mg Sublingual Q5 min PRN Everlene Balls, MD   0.4 mg at 03/07/15 2307  . omega-3 acid ethyl esters (LOVAZA) capsule 1 g  1 g Oral Daily Dwijesh Patel, MD      . potassium chloride (K-DUR) CR tablet 10 mEq  10 mEq Oral Daily Janora Norlander, MD      . rosuvastatin (CRESTOR) tablet 40 mg  40 mg Oral Daily Janora Norlander, MD        Filed Vitals:   03/08/15 0210 03/08/15 0223 03/08/15 0453 03/08/15 0600  BP:  192/82 171/112 172/86  Pulse:  58 68   Temp: 98.7 F (37.1 C) 97.5 F (36.4 C) 97.8 F (36.6 C)   TempSrc: Oral Oral Oral   Resp:  18 20   Height:  6\' 2"  (1.88 m)    Weight:  209 lb 3.2 oz (94.892 kg)    SpO2:  100% 100%     PHYSICAL EXAM General: NAD HEENT: Normal. Neck: No JVD, no thyromegaly.  Lungs: Bibasilar dry crackles. CV: Nondisplaced PMI.  Regular rate and rhythm, normal S1/S2, no S3/S4, no murmur.  No pretibial edema.  No carotid bruit.  Normal pedal pulses.  Abdomen: Soft, nontender, no hepatosplenomegaly, no distention.  Neurologic: Alert and oriented x 3.  Psych: Normal affect. Musculoskeletal: Normal range of motion. No gross deformities. Extremities: No clubbing or cyanosis.   TELEMETRY: Reviewed telemetry pt in sinus rhythm with PVC's.  LABS: Basic Metabolic Panel:  Recent Labs  03/06/15 0342  03/07/15 2231  NA 138 136  K 3.8 3.8  CL 112 105  CO2 22 23  GLUCOSE 115* 127*  BUN 16 16  CREATININE 1.21 1.24  CALCIUM 8.5 9.3   Liver Function Tests: No results for input(s): AST, ALT, ALKPHOS, BILITOT, PROT, ALBUMIN in the last 72 hours. No results for input(s): LIPASE, AMYLASE in the last 72 hours. CBC:  Recent Labs  03/07/15 2231  WBC 4.1  HGB 14.3  HCT 40.8  MCV 90.9  PLT 124*   Cardiac Enzymes: No results for input(s): CKTOTAL, CKMB, CKMBINDEX, TROPONINI in the last 72 hours. BNP: Invalid input(s): POCBNP D-Dimer: No results for input(s): DDIMER in the last 72 hours. Hemoglobin A1C: No results for input(s): HGBA1C in the last 72 hours. Fasting Lipid Panel: No results for input(s): CHOL, HDL, LDLCALC, TRIG, CHOLHDL, LDLDIRECT in the last 72 hours. Thyroid Function Tests: No results for input(s): TSH, T4TOTAL, T3FREE, THYROIDAB in the last 72 hours.  Invalid input(s): FREET3 Anemia Panel: No results for input(s): VITAMINB12, FOLATE, FERRITIN, TIBC, IRON, RETICCTPCT in the  last 72 hours.  RADIOLOGY: Dg Chest 2 View  03/07/2015   CLINICAL DATA:  Hypertension, chest pain.  EXAM: CHEST  2 VIEW  COMPARISON:  03/02/2015  FINDINGS: Minimal bibasilar atelectasis. Heart is normal size. Mild tortuosity of the thoracic aorta. No effusions. No acute bony abnormality.  IMPRESSION: Bibasilar atelectasis.   Electronically Signed   By: Rolm Baptise M.D.   On: 03/07/2015 22:59   Dg Chest 2 View  03/02/2015   CLINICAL DATA:  Chest tightness and difficulty breathing  EXAM: CHEST  2 VIEW  COMPARISON:  February 20, 2015  FINDINGS: There is no edema or consolidation. The heart size and pulmonary vascularity are normal. No adenopathy. There is degenerative change in the thoracic spine.  IMPRESSION: No edema or consolidation.   Electronically Signed   By: Lowella Grip III M.D.   On: 03/02/2015 08:07   Dg Chest 2 View  02/19/2015   CLINICAL DATA:  Preoperative evaluation for cardiac catheterization, known shortness of Breath  EXAM: CHEST  2 VIEW  COMPARISON:  01/03/2015  FINDINGS: Cardiac shadow is within normal limits. The lungs are clear bilaterally. The previously seen bibasilar infiltrates have resolved. No acute bony abnormality is seen. Degenerative changes of the thoracic spine are again noted.  IMPRESSION: No active cardiopulmonary disease.   Electronically Signed   By: Inez Catalina M.D.   On: 02/19/2015 09:21   Ct Head Wo Contrast  02/20/2015   CLINICAL DATA:  Left-sided numbness upon waking.  EXAM: CT HEAD WITHOUT CONTRAST  TECHNIQUE: Contiguous axial images were obtained from the base of the skull through the vertex without intravenous contrast.  COMPARISON:  Head CT 11/24/2007, brain MRI 11/30/2007  FINDINGS: No intracranial hemorrhage, mass effect, or midline shift. No hydrocephalus. The basilar cisterns are patent. No evidence of territorial infarct. No intracranial fluid collection. Calvarium is intact. Included paranasal sinuses and mastoid air cells are well aerated.   IMPRESSION: No acute intracranial abnormality.   Electronically Signed   By: Jeb Levering M.D.   On: 02/20/2015 06:55   Mr Brain Wo Contrast  02/20/2015   CLINICAL DATA:  Heaviness and tingling in the left upper and lower extremity. Symptoms lasted approximately 30 minutes and then resolved. Patient is scheduled for cardiac catheterization next week.  EXAM: MRI HEAD WITHOUT CONTRAST  MRA HEAD WITHOUT CONTRAST  TECHNIQUE: Multiplanar, multiecho pulse sequences of the brain and surrounding structures were obtained without  intravenous contrast. Angiographic images of the head were obtained using MRA technique without contrast.  COMPARISON:  CT head without contrast from the same day. MRI of the brain 11/30/2007.  FINDINGS: MRI HEAD FINDINGS  The diffusion-weighted images demonstrate no evidence for acute or subacute infarct.  No acute hemorrhage or mass lesion is present. The ventricles are of normal size. Mild periventricular white matter changes are noted bilaterally. No significant extraaxial fluid collection is present.  Flow is present in the major intracranial arteries. The globes and orbits are within normal limits. Polyps and mucosal thickening are noted along the floor of the maxillary sinuses bilaterally. There is mild mucosal thickening in the anterior ethmoid air cells and inferior frontal sinuses. The sphenoid sinuses are clear. Minimal fluid is present in the left mastoid air cells. No obstructing nasopharyngeal lesion is evident.  MRA HEAD FINDINGS  Internal carotid arteries are within normal limits from the high cervical segments through the ICA termini bilaterally. The A1 and M1 segments are normal. Anterior communicating artery is not visualized. The MCA bifurcations are intact. There is moderate attenuation of distal MCA branch vessels without proximal stenosis or occlusion.  The vertebral arteries are codominant. The right PICA is visualized and normal. The left AICA is dominant. The basilar  artery is within normal limits.  The right posterior cerebral artery originates from the basilar tip. The left posterior cerebral artery is fed by P1 segment and posterior communicating artery. There is moderate attenuation of distal PCA branch vessels without significant proximal stenosis or occlusion.  IMPRESSION: 1. No acute intracranial abnormality. 2. Mild periventricular white matter changes are slightly advanced for age. This likely reflects the sequela of chronic microvascular ischemia. 3. Moderate distal small vessel attenuation in both the anterior and posterior circulation likely reflects atherosclerotic and hypertensive. 4. No significant proximal stenosis, aneurysm, or branch vessel occlusion.   Electronically Signed   By: San Morelle M.D.   On: 02/20/2015 18:37   Dg Chest Port 1 View  02/20/2015   CLINICAL DATA:  Awoke this morning with numbness in left arm and left leg.  EXAM: PORTABLE CHEST - 1 VIEW  COMPARISON:  Frontal and lateral views 02/19/2015  FINDINGS: The cardiomediastinal contours are unchanged allowing for differences in technique. Pulmonary vasculature is normal. No consolidation, pleural effusion, or pneumothorax. No acute osseous abnormalities are seen. There is degenerative change throughout the spine.  IMPRESSION: No acute pulmonary process.   Electronically Signed   By: Jeb Levering M.D.   On: 02/20/2015 06:50   Mr Jodene Nam Head/brain Wo Cm  02/20/2015   CLINICAL DATA:  Heaviness and tingling in the left upper and lower extremity. Symptoms lasted approximately 30 minutes and then resolved. Patient is scheduled for cardiac catheterization next week.  EXAM: MRI HEAD WITHOUT CONTRAST  MRA HEAD WITHOUT CONTRAST  TECHNIQUE: Multiplanar, multiecho pulse sequences of the brain and surrounding structures were obtained without intravenous contrast. Angiographic images of the head were obtained using MRA technique without contrast.  COMPARISON:  CT head without contrast from the  same day. MRI of the brain 11/30/2007.  FINDINGS: MRI HEAD FINDINGS  The diffusion-weighted images demonstrate no evidence for acute or subacute infarct.  No acute hemorrhage or mass lesion is present. The ventricles are of normal size. Mild periventricular white matter changes are noted bilaterally. No significant extraaxial fluid collection is present.  Flow is present in the major intracranial arteries. The globes and orbits are within normal limits. Polyps and mucosal thickening are noted along  the floor of the maxillary sinuses bilaterally. There is mild mucosal thickening in the anterior ethmoid air cells and inferior frontal sinuses. The sphenoid sinuses are clear. Minimal fluid is present in the left mastoid air cells. No obstructing nasopharyngeal lesion is evident.  MRA HEAD FINDINGS  Internal carotid arteries are within normal limits from the high cervical segments through the ICA termini bilaterally. The A1 and M1 segments are normal. Anterior communicating artery is not visualized. The MCA bifurcations are intact. There is moderate attenuation of distal MCA branch vessels without proximal stenosis or occlusion.  The vertebral arteries are codominant. The right PICA is visualized and normal. The left AICA is dominant. The basilar artery is within normal limits.  The right posterior cerebral artery originates from the basilar tip. The left posterior cerebral artery is fed by P1 segment and posterior communicating artery. There is moderate attenuation of distal PCA branch vessels without significant proximal stenosis or occlusion.  IMPRESSION: 1. No acute intracranial abnormality. 2. Mild periventricular white matter changes are slightly advanced for age. This likely reflects the sequela of chronic microvascular ischemia. 3. Moderate distal small vessel attenuation in both the anterior and posterior circulation likely reflects atherosclerotic and hypertensive. 4. No significant proximal stenosis, aneurysm,  or branch vessel occlusion.   Electronically Signed   By: San Morelle M.D.   On: 02/20/2015 18:37      ASSESSMENT AND PLAN: *Hypertensive urgency:  -- Increase carvedilol to 12.5 mg by mouth twice a day to control BP and for antianginal benefit. Monitor HR. -- Continue Avapro 300 mg. 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 NYHA class 1. -- Continue carvedilol, ARB, Lasix. -- Patient may benefit from hydralazine and isosorbide combination due to his race. -- Appears to be euvolemic  *CAD s/p PCI to ramus and LCX: Chest pressure is likely secondary to hypertensive urgency. Given lack of new ECG findings which are different from baseline abnormalities (i.e. No ST elevations), this is not representative of stent thrombosis. No other evidence of obstructive coronary disease. Chest pressure very atypical to be primarily from obstructive lesions as symptoms are not occurring with exertion as well. -- Continue aspirin 81 mg daily -- Continue Plavix 75 g daily -- Increase carvedilol to 12.5 mg twice a day -- Continue Crestor 40 mg daily -- Consider GI evaluation for chest pain.  *Atrial fibrillation -- Continue eliquis and carvedilol    Kate Sable, M.D., F.A.C.C.

## 2015-03-08 NOTE — Discharge Instructions (Addendum)

## 2015-03-08 NOTE — H&P (Signed)
Please see 3/26 H&P by Dr. Posey Pronto, mislabeled "Consult Note"

## 2015-03-08 NOTE — Progress Notes (Signed)
UR completed 

## 2015-03-08 NOTE — Progress Notes (Signed)
Pt 5/10 CP, 1 SL nitro given, O2 initiated @2LNC , EKG done, no changes from prior study, B/P 158/89, HR 91, MD aware,will continue to monitor.

## 2015-03-09 ENCOUNTER — Ambulatory Visit (HOSPITAL_COMMUNITY): Admission: RE | Admit: 2015-03-09 | Payer: Medicare Other | Source: Ambulatory Visit | Admitting: Internal Medicine

## 2015-03-09 ENCOUNTER — Encounter (HOSPITAL_COMMUNITY): Admission: RE | Payer: Self-pay | Source: Ambulatory Visit

## 2015-03-09 ENCOUNTER — Telehealth: Payer: Self-pay | Admitting: Nurse Practitioner

## 2015-03-09 DIAGNOSIS — I10 Essential (primary) hypertension: Secondary | ICD-10-CM | POA: Diagnosis not present

## 2015-03-09 SURGERY — LEFT AND RIGHT HEART CATHETERIZATION WITH CORONARY ANGIOGRAM

## 2015-03-09 MED ORDER — IRBESARTAN 300 MG PO TABS: 300.0000 mg | ORAL_TABLET | Freq: Every day | ORAL | Status: DC

## 2015-03-09 MED ORDER — CARVEDILOL 12.5 MG PO TABS
12.5000 mg | ORAL_TABLET | Freq: Two times a day (BID) | ORAL | Status: DC
Start: 2015-03-09 — End: 2015-03-20

## 2015-03-09 MED ORDER — NITROGLYCERIN 0.4 MG SL SUBL: 0.4000 mg | SUBLINGUAL_TABLET | SUBLINGUAL | Status: DC | PRN

## 2015-03-09 NOTE — Progress Notes (Signed)
Subjective: No compalints  Objective: Vital signs in last 24 hours: Temp:  [97.4 F (36.3 C)-98.2 F (36.8 C)] 98.2 F (36.8 C) (03/28 0513) Pulse Rate:  [64-89] 78 (03/28 0513) Resp:  [16-20] 20 (03/28 0513) BP: (109-167)/(62-92) 109/62 mmHg (03/28 0513) SpO2:  [97 %-99 %] 99 % (03/28 0513) Weight:  [207 lb 3.2 oz (93.985 kg)] 207 lb 3.2 oz (93.985 kg) (03/28 0513) Last BM Date: 03/08/15  Intake/Output from previous day:   Intake/Output this shift:    Medications Current Facility-Administered Medications  Medication Dose Route Frequency Provider Last Rate Last Dose  . acetaminophen (TYLENOL) tablet 1,000 mg  1,000 mg Oral Once Everlene Balls, MD   1,000 mg at 03/07/15 2333  . apixaban (ELIQUIS) tablet 5 mg  5 mg Oral BID Janora Norlander, MD   5 mg at 03/08/15 2227  . aspirin chewable tablet 81 mg  81 mg Oral Daily Janora Norlander, MD   81 mg at 03/08/15 1107  . carvedilol (COREG) tablet 12.5 mg  12.5 mg Oral BID WC Herminio Commons, MD   12.5 mg at 03/08/15 1717  . clopidogrel (PLAVIX) tablet 75 mg  75 mg Oral Q breakfast Janora Norlander, MD   75 mg at 03/08/15 0827  . famotidine (PEPCID) tablet 40 mg  40 mg Oral BID Janora Norlander, MD   40 mg at 03/08/15 2227  . furosemide (LASIX) tablet 20 mg  20 mg Oral Daily Janora Norlander, MD   20 mg at 03/08/15 1106  . irbesartan (AVAPRO) tablet 300 mg  300 mg Oral Daily Janora Norlander, MD   300 mg at 03/08/15 1105  . isosorbide mononitrate (IMDUR) 24 hr tablet 60 mg  60 mg Oral Daily Janora Norlander, MD   60 mg at 03/08/15 1107  . multivitamin with minerals tablet 1 tablet  1 tablet Oral Daily Janora Norlander, MD   1 tablet at 03/08/15 1106  . nicotine (NICODERM CQ - dosed in mg/24 hours) patch 14 mg  14 mg Transdermal Daily Janora Norlander, MD   14 mg at 03/08/15 1105  . nitroGLYCERIN (NITROSTAT) SL tablet 0.4 mg  0.4 mg Sublingual Q5 min PRN Everlene Balls, MD   0.4 mg at 03/07/15 2307  . omega-3 acid ethyl esters (LOVAZA) capsule 1 g  1 g Oral  Daily Janora Norlander, MD   1 g at 03/08/15 1106  . potassium chloride (K-DUR) CR tablet 10 mEq  10 mEq Oral Daily Janora Norlander, MD   10 mEq at 03/08/15 1106  . rosuvastatin (CRESTOR) tablet 40 mg  40 mg Oral Daily Janora Norlander, MD   40 mg at 03/08/15 1106    PE: General appearance: alert, cooperative and no distress Lungs: clear to auscultation bilaterally Heart: regular rate and rhythm, S1, S2 normal, no murmur, click, rub or gallop Abdomen: +BS, nontender, no distension Extremities: No LEE Pulses: 2+ and symmetric Skin: Warm and dry Neurologic: Grossly normal  Lab Results:   Recent Labs  03/07/15 2231  WBC 4.1  HGB 14.3  HCT 40.8  PLT 124*   BMET  Recent Labs  03/07/15 2231  NA 136  K 3.8  CL 105  CO2 23  GLUCOSE 127*  BUN 16  CREATININE 1.24  CALCIUM 9.3   PT/INR  Recent Labs  03/07/15 2231  LABPROT 14.7  INR 1.14      Assessment/Plan   Hypertensive urgency:  -- carvedilol 12.5(increased yesterday) mg by mouth twice a day to control BP and for  antianginal benefit. Monitor HR.  BP much better.  No changes today.  -- Continue Avapro 300 mg. 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 NYHA class 1. -- no I/O data.  Weight down two pounds.  --Continue carvedilol, ARB, Lasix. -- Patient may benefit from hydralazine and isosorbide combination due to his race. -- Appears to be euvolemic  *CAD s/p PCI to ramus and LCX on 03/04/15: Chest pressure is likely secondary to hypertensive urgency. Given lack of new ECG findings which are different from baseline abnormalities (i.e. No ST elevations), this is not representative of stent thrombosis. No other evidence of obstructive coronary disease. Chest pressure very atypical to be primarily from obstructive lesions as symptoms are not occurring with exertion as well. -- Continue  aspirin 81 mg daily -- Continue Plavix 75 g daily -- Increase carvedilol to 12.5 mg twice a day -- Continue Crestor 40 mg daily -- Consider GI evaluation for chest pain.  Elevated troponin  Likely from Hypertensive urgency  *Atrial fibrillation -- Continue eliquis and carvedilol.  Rate controlled   Disposition:   DC home today.  We discussed daily weight monitoring, signs of volume overload and dehydration.  We also discussed diet and exercise.  Phase 2 CR in the near future.    According to DC summary on 3/25, he will be on ASA, plavix and eliquis for 30 days then stop the plavix.  TCM followup planned for this week.   Tarri Fuller PA-C 03/09/2015 7:39 AM  Patient seen and examined. I agree with the assessment and plan as detailed above. See also my additional thoughts below.   Telemetry today reveals normal sinus rhythm. The patient is improved. He is ready to go home and we're finalizing his discharge plans. I agree with all of the plans as outlined above.  Dola Argyle, MD, [facility] 03/09/2015 9:11 AM

## 2015-03-09 NOTE — Progress Notes (Signed)
UR completed 

## 2015-03-09 NOTE — Telephone Encounter (Signed)
New message      TCM appt on 03-18-15 per [facility]

## 2015-03-09 NOTE — Progress Notes (Signed)
Pt discharged to home per MD order. Pt received and reviewed all discharge instructions and medication information including follow-up appointments and prescription information. Pt verbalized understanding. Pt alert and oriented at discharge with no complaints of pain. Pt IV and telemetry box removed prior to discharge. Pt escorted to private vehicle via wheelchair by guest services volunteer. Gracielynn Birkel C  

## 2015-03-09 NOTE — Discharge Summary (Signed)
Physician Discharge Summary    Cardiologist:  Hilty  Patient ID: Rodney [name] MRN: [id number] DOB/AGE: 16-Jan-1941 74 y.o.  Admit date: 03/07/2015 Discharge date: 03/09/2015  Admission Diagnoses:  Hypertensive Urgency, Chest Pain  Discharge Diagnoses:  Active Problems:   Hypertensive urgency   Chronic Systolic Heart failure   CAD   PAF   HLD    Chest Pain, atypical  Discharged Condition: stable  Hospital Course:   Rodney [name] is a 74 year old male with history of hypertension, CAD status post recent PCI to left circumflex and ramus, systolic cardiomyopathy LVEF 35-40%, peripheral arterial disease status post aorta iliac stenting comes to the emergency department after having elevated blood pressures. Patient was discharged from the hospital yesterday after receiving PCI to left circumflex and ramus arteries. During the hospitalization, patient was noted to have elevated blood pressures. In addition to carvedilol 6.25 mg by mouth twice a day, Imdur 60 mg, Lasix 20 mg patient was started on valsartan 160 mg daily at discharge. He was informed to come to the hospital with systolic blood pressures greater than 200. This evening, patient noticed that his blood pressure was 210/110. It is to come to the hospital for evaluation this evening. After coming to the hospital, patient noticed mild chest pressure without any shortness of breath, diaphoresis orthopnea or dyspnea on exertion. He also has not noticed any lower extreme edema. He is very compliant with his medications and take some precisely every day. He denies any side effects from his medications.  The patient was admitted for observation.  Initial troponin was elevated to 0.14, along with CP, and both were thought to be related to hypertensive urgency.  Valsartan was increased to 300 daily. Coreg was increased to 12.50BID.  BP improved.  CP resolved.  He appeared euvolemic. He remained in NSR.  We discussed daily weight monitoring, signs of  volume overload and dehydration. We also discussed diet and exercise. Phase 2 CR in the near future.  The patient was seen by Dr. Ron Parker who felt he was stable for DC home.   Per previous DC summary, plan to DC plavix after one month and continue ASA and eliquis.     Consults: None  Significant Diagnostic Studies:  EXAM: CHEST 2 VIEW  COMPARISON: 03/02/2015  FINDINGS: Minimal bibasilar atelectasis. Heart is normal size. Mild tortuosity of the thoracic aorta. No effusions. No acute bony abnormality.  IMPRESSION: Bibasilar atelectasis.  Treatments: See above  Discharge Exam: Blood pressure 109/62, pulse 78, temperature 98.2 F (36.8 C), temperature source Oral, resp. rate 20, height 6\' 2"  (1.88 m), weight 207 lb 3.2 oz (93.985 kg), SpO2 99 %.   Disposition: 01-Home or Self Care      Discharge Instructions    Diet - low sodium heart healthy    Complete by:  As directed      Discharge instructions    Complete by:  As directed   Monitor your weight every morning.  If you gain 3 pounds in 24 hours, or 5 pounds in a week, call the office for instructions.     Increase activity slowly    Complete by:  As directed             Medication List    STOP taking these medications        valsartan 160 MG tablet  Commonly known as:  DIOVAN  Replaced by:  irbesartan 300 MG tablet      TAKE these medications  apixaban 5 MG Tabs tablet  Commonly known as:  ELIQUIS  Take 1 tablet (5 mg total) by mouth 2 (two) times daily.     aspirin 81 MG tablet  Take 1 tablet (81 mg total) by mouth daily. Hold while on Plavix, restart after Plavix completed.     carvedilol 12.5 MG tablet  Commonly known as:  COREG  Take 1 tablet (12.5 mg total) by mouth 2 (two) times daily with a meal.     clopidogrel 75 MG tablet  Commonly known as:  PLAVIX  Take 1 tablet (75 mg total) by mouth daily with breakfast.     CRESTOR 40 MG tablet  Generic drug:  rosuvastatin  Take 40 mg by  mouth daily.     famotidine 40 MG tablet  Commonly known as:  PEPCID  Take 1 tablet (40 mg total) by mouth 2 (two) times daily.     fish oil-omega-3 fatty acids 1000 MG capsule  Take 1 g by mouth daily.     furosemide 20 MG tablet  Commonly known as:  LASIX  Take 1 tablet (20 mg total) by mouth daily.     irbesartan 300 MG tablet  Commonly known as:  AVAPRO  Take 1 tablet (300 mg total) by mouth daily.     isosorbide mononitrate 60 MG 24 hr tablet  Commonly known as:  IMDUR  Take 1 tablet (60 mg total) by mouth daily.     multivitamin capsule  Take 1 capsule by mouth daily.     nicotine 14 mg/24hr patch  Commonly known as:  NICODERM CQ - dosed in mg/24 hours  Place 1 patch (14 mg total) onto the skin daily.     nitroGLYCERIN 0.4 MG SL tablet  Commonly known as:  NITROSTAT  Place 1 tablet (0.4 mg total) under the tongue every 5 (five) minutes as needed for chest pain (CP or SOB).     potassium chloride 10 MEQ tablet  Commonly known as:  K-DUR  Take 1 tablet (10 mEq total) by mouth daily.       Follow-up Information    Follow up with Truitt Merle, NP On 03/19/2015.   Specialty:  Nurse Practitioner   Why:  8:30 AM   Contact information:   [address]  [place] [address] [phone]      Greater than 30 minutes was spent completing the patient's discharge.    Signed: HAGER, BRYAN. PAC 03/09/2015, 10:16 AM  Patient seen and examined. I agree with the assessment and plan as detailed above. See also my additional thoughts below.   Please see the progress note for today also. I made the decision for the patient to go home.  Dola Argyle, MD, [facility] 03/09/2015 11:45 AM

## 2015-03-10 ENCOUNTER — Telehealth: Payer: Self-pay | Admitting: Internal Medicine

## 2015-03-10 NOTE — Telephone Encounter (Signed)
Patient contacted regarding discharge from [facility] on 3/28 Patient understands to follow up with provider ? 4/7 @ 8:30am with Rodney [name] Patient understands discharge instructions? Yes  Patient understands medications and regiment? Yes, pt stated he has pick up new medication from pharmacy.  Patient understands to bring all medications to this visit? Yes

## 2015-03-10 NOTE — Telephone Encounter (Signed)
I was paged by Mr. Rodney [name] regarding several complaints after taking his carvedilol this evening.  Of note, he has been hospitalized twice within the last week for PCI and subsequent hypertensive urgency with resultant uptitration of his medications.  About an hour after taking his evening dose of carvedilol 12.5 mg, he developed a burning in his chest (reminicent of heartburn), nervousness, and leg discomfort.  The pain has improved on its own and is now only 1/10 in intensity.  He reports that his blood pressure at this time is well-controlled.(117/69).  He has not taken SL NTG for his pain.  I advised him to monitor his chest burning for the next 5-10 minutes, as it seems to be improving and is atypical in nature.  If it persists, I advised him to take SL NTG.  If it remains thereafter, he was advised to come to the ED for further evaluation.  I also recommended that he decrease his carvedilol to 6.25 mg BID as long as his BP remains below 150/100, as he previously tolerated this dose well.  If he continues to have concerns about side effects of this medication, Mr. Rodney [name] should contact his cardiologist, Dr. Debara Pickett, for further recommendation.  Mr. Rodney [name] is in agreement with this plan.  I will forward this message to Dr. Debara Pickett for his review.  Nelva Bush, MD Cardiology Fellow

## 2015-03-11 ENCOUNTER — Encounter: Payer: Medicare Other | Admitting: Nurse Practitioner

## 2015-03-12 ENCOUNTER — Telehealth: Payer: Self-pay | Admitting: *Deleted

## 2015-03-12 ENCOUNTER — Encounter: Payer: Self-pay | Admitting: Physician Assistant

## 2015-03-12 NOTE — Telephone Encounter (Signed)
Faxed orders for phase 2 cardiac rehab - no GXT required.  

## 2015-03-16 ENCOUNTER — Inpatient Hospital Stay (HOSPITAL_COMMUNITY)
Admission: EM | Admit: 2015-03-16 | Discharge: 2015-03-20 | DRG: 309 | Disposition: A | Discharge status: Home / Self Care | Payer: Medicare Other | Attending: Cardiovascular Disease | Admitting: Cardiovascular Disease

## 2015-03-16 ENCOUNTER — Encounter (HOSPITAL_COMMUNITY): Payer: Self-pay | Admitting: Emergency Medicine

## 2015-03-16 ENCOUNTER — Emergency Department (HOSPITAL_COMMUNITY): Payer: Medicare Other

## 2015-03-16 ENCOUNTER — Other Ambulatory Visit (HOSPITAL_COMMUNITY): Payer: Self-pay

## 2015-03-16 DIAGNOSIS — Z9889 Other specified postprocedural states: Secondary | ICD-10-CM

## 2015-03-16 DIAGNOSIS — R0609 Other forms of dyspnea: Secondary | ICD-10-CM | POA: Diagnosis present

## 2015-03-16 DIAGNOSIS — E785 Hyperlipidemia, unspecified: Secondary | ICD-10-CM | POA: Diagnosis present

## 2015-03-16 DIAGNOSIS — Z8673 Personal history of transient ischemic attack (TIA), and cerebral infarction without residual deficits: Secondary | ICD-10-CM

## 2015-03-16 DIAGNOSIS — R0600 Dyspnea, unspecified: Secondary | ICD-10-CM

## 2015-03-16 DIAGNOSIS — I5042 Chronic combined systolic (congestive) and diastolic (congestive) heart failure: Secondary | ICD-10-CM | POA: Diagnosis present

## 2015-03-16 DIAGNOSIS — Z7982 Long term (current) use of aspirin: Secondary | ICD-10-CM

## 2015-03-16 DIAGNOSIS — Z7902 Long term (current) use of antithrombotics/antiplatelets: Secondary | ICD-10-CM | POA: Diagnosis not present

## 2015-03-16 DIAGNOSIS — M109 Gout, unspecified: Secondary | ICD-10-CM | POA: Diagnosis present

## 2015-03-16 DIAGNOSIS — Z8679 Personal history of other diseases of the circulatory system: Secondary | ICD-10-CM

## 2015-03-16 DIAGNOSIS — I480 Paroxysmal atrial fibrillation: Principal | ICD-10-CM | POA: Diagnosis present

## 2015-03-16 DIAGNOSIS — I4891 Unspecified atrial fibrillation: Secondary | ICD-10-CM | POA: Diagnosis present

## 2015-03-16 DIAGNOSIS — Z79899 Other long term (current) drug therapy: Secondary | ICD-10-CM

## 2015-03-16 DIAGNOSIS — Z7901 Long term (current) use of anticoagulants: Secondary | ICD-10-CM | POA: Diagnosis not present

## 2015-03-16 DIAGNOSIS — I2510 Atherosclerotic heart disease of native coronary artery without angina pectoris: Secondary | ICD-10-CM | POA: Diagnosis present

## 2015-03-16 DIAGNOSIS — F17210 Nicotine dependence, cigarettes, uncomplicated: Secondary | ICD-10-CM | POA: Diagnosis present

## 2015-03-16 DIAGNOSIS — Z9861 Coronary angioplasty status: Secondary | ICD-10-CM

## 2015-03-16 DIAGNOSIS — I10 Essential (primary) hypertension: Secondary | ICD-10-CM | POA: Diagnosis present

## 2015-03-16 DIAGNOSIS — Z5181 Encounter for therapeutic drug level monitoring: Secondary | ICD-10-CM | POA: Diagnosis not present

## 2015-03-16 LAB — URINALYSIS, ROUTINE W REFLEX MICROSCOPIC
Bilirubin Urine: NEGATIVE
Glucose, UA: NEGATIVE mg/dL
Hgb urine dipstick: NEGATIVE
Ketones, ur: NEGATIVE mg/dL
LEUKOCYTES UA: NEGATIVE
Nitrite: NEGATIVE
PH: 5 (ref 5.0–8.0)
PROTEIN: NEGATIVE mg/dL
Specific Gravity, Urine: 1.009 (ref 1.005–1.030)
Urobilinogen, UA: 0.2 mg/dL (ref 0.0–1.0)

## 2015-03-16 LAB — BASIC METABOLIC PANEL
ANION GAP: 8 (ref 5–15)
BUN: 13 mg/dL (ref 6–23)
CALCIUM: 9.3 mg/dL (ref 8.4–10.5)
CO2: 23 mmol/L (ref 19–32)
Chloride: 108 mmol/L (ref 96–112)
Creatinine, Ser: 1.39 mg/dL — ABNORMAL HIGH (ref 0.50–1.35)
GFR calc non Af Amer: 49 mL/min — ABNORMAL LOW (ref >90)
GFR, EST AFRICAN AMERICAN: 57 mL/min — AB (ref >90)
Glucose, Bld: 126 mg/dL — ABNORMAL HIGH (ref 70–99)
Potassium: 4.5 mmol/L (ref 3.5–5.1)
Sodium: 139 mmol/L (ref 135–145)

## 2015-03-16 LAB — COMPREHENSIVE METABOLIC PANEL
ALT: 30 U/L (ref 0–53)
ANION GAP: 7 (ref 5–15)
AST: 24 U/L (ref 0–37)
Albumin: 3.8 g/dL (ref 3.5–5.2)
Alkaline Phosphatase: 58 U/L (ref 39–117)
BUN: 13 mg/dL (ref 6–23)
CO2: 22 mmol/L (ref 19–32)
CREATININE: 1.37 mg/dL — AB (ref 0.50–1.35)
Calcium: 9.1 mg/dL (ref 8.4–10.5)
Chloride: 110 mmol/L (ref 96–112)
GFR calc Af Amer: 58 mL/min — ABNORMAL LOW (ref >90)
GFR calc non Af Amer: 50 mL/min — ABNORMAL LOW (ref >90)
Glucose, Bld: 108 mg/dL — ABNORMAL HIGH (ref 70–99)
Potassium: 4.6 mmol/L (ref 3.5–5.1)
SODIUM: 139 mmol/L (ref 135–145)
TOTAL PROTEIN: 6.7 g/dL (ref 6.0–8.3)
Total Bilirubin: 0.6 mg/dL (ref 0.3–1.2)

## 2015-03-16 LAB — BRAIN NATRIURETIC PEPTIDE: B NATRIURETIC PEPTIDE 5: 164.4 pg/mL — AB (ref 0.0–100.0)

## 2015-03-16 LAB — PROTIME-INR
INR: 1.19 (ref 0.00–1.49)
PROTHROMBIN TIME: 15.2 s (ref 11.6–15.2)

## 2015-03-16 LAB — CBC
HCT: 44.7 % (ref 39.0–52.0)
Hemoglobin: 15.3 g/dL (ref 13.0–17.0)
MCH: 31.5 pg (ref 26.0–34.0)
MCHC: 34.2 g/dL (ref 30.0–36.0)
MCV: 92 fL (ref 78.0–100.0)
Platelets: 146 10*3/uL — ABNORMAL LOW (ref 150–400)
RBC: 4.86 MIL/uL (ref 4.22–5.81)
RDW: 13.2 % (ref 11.5–15.5)
WBC: 3.9 10*3/uL — ABNORMAL LOW (ref 4.0–10.5)

## 2015-03-16 LAB — MAGNESIUM: MAGNESIUM: 2.1 mg/dL (ref 1.5–2.5)

## 2015-03-16 LAB — I-STAT TROPONIN, ED
TROPONIN I, POC: 0.01 ng/mL (ref 0.00–0.08)
Troponin i, poc: 0 ng/mL (ref 0.00–0.08)

## 2015-03-16 LAB — TROPONIN I
Troponin I: <0.03 ng/mL (ref <0.031)
Troponin I: <0.03 ng/mL (ref <0.031)

## 2015-03-16 LAB — APTT: aPTT: 32 seconds (ref 24–37)

## 2015-03-16 LAB — TSH: TSH: 0.407 u[IU]/mL (ref 0.350–4.500)

## 2015-03-16 MED ORDER — ASPIRIN 81 MG PO CHEW
324.0000 mg | CHEWABLE_TABLET | Freq: Once | ORAL | Status: DC
  Filled 2015-03-16: qty 4

## 2015-03-16 MED ORDER — NITROGLYCERIN 0.4 MG SL SUBL
0.4000 mg | SUBLINGUAL_TABLET | SUBLINGUAL | Status: DC | PRN
  Administered 2015-03-16: 0.4 mg via SUBLINGUAL
  Filled 2015-03-16: qty 1

## 2015-03-16 MED ORDER — ADULT MULTIVITAMIN W/MINERALS CH
1.0000 | ORAL_TABLET | Freq: Every day | ORAL | Status: DC
  Administered 2015-03-17 – 2015-03-20 (×4): 1 via ORAL
  Filled 2015-03-16 (×4): qty 1

## 2015-03-16 MED ORDER — SODIUM CHLORIDE 0.9 % IV SOLN
INTRAVENOUS | Status: DC
Start: 2015-03-16 — End: 2015-03-17
  Administered 2015-03-16: 17:00:00 via INTRAVENOUS

## 2015-03-16 MED ORDER — FAMOTIDINE 40 MG PO TABS
40.0000 mg | ORAL_TABLET | Freq: Two times a day (BID) | ORAL | Status: DC
  Administered 2015-03-16 – 2015-03-20 (×8): 40 mg via ORAL
  Filled 2015-03-16 (×9): qty 1

## 2015-03-16 MED ORDER — ONDANSETRON HCL 4 MG/2ML IJ SOLN
4.0000 mg | Freq: Four times a day (QID) | INTRAMUSCULAR | Status: DC | PRN
Start: 2015-03-16 — End: 2015-03-20

## 2015-03-16 MED ORDER — IRBESARTAN 300 MG PO TABS
300.0000 mg | ORAL_TABLET | Freq: Every day | ORAL | Status: DC
  Administered 2015-03-17 – 2015-03-20 (×4): 300 mg via ORAL
  Filled 2015-03-16 (×4): qty 1

## 2015-03-16 MED ORDER — OMEGA-3 FATTY ACIDS 1000 MG PO CAPS: 1.0000 g | ORAL_CAPSULE | Freq: Every day | ORAL | Status: DC

## 2015-03-16 MED ORDER — MULTIVITAMINS PO CAPS: 1.0000 | ORAL_CAPSULE | Freq: Every day | ORAL | Status: DC

## 2015-03-16 MED ORDER — APIXABAN 5 MG PO TABS
5.0000 mg | ORAL_TABLET | Freq: Two times a day (BID) | ORAL | Status: DC
  Administered 2015-03-16 – 2015-03-20 (×8): 5 mg via ORAL
  Filled 2015-03-16 (×12): qty 1

## 2015-03-16 MED ORDER — CLOPIDOGREL BISULFATE 75 MG PO TABS
75.0000 mg | ORAL_TABLET | Freq: Every day | ORAL | Status: DC
  Administered 2015-03-17 – 2015-03-20 (×3): 75 mg via ORAL
  Filled 2015-03-16 (×7): qty 1

## 2015-03-16 MED ORDER — POTASSIUM CHLORIDE ER 10 MEQ PO TBCR
10.0000 meq | EXTENDED_RELEASE_TABLET | Freq: Every day | ORAL | Status: DC
  Administered 2015-03-17 – 2015-03-20 (×4): 10 meq via ORAL
  Filled 2015-03-16 (×4): qty 1

## 2015-03-16 MED ORDER — NITROGLYCERIN 0.4 MG SL SUBL: 0.4000 mg | SUBLINGUAL_TABLET | SUBLINGUAL | Status: DC | PRN

## 2015-03-16 MED ORDER — ISOSORBIDE MONONITRATE ER 60 MG PO TB24
60.0000 mg | ORAL_TABLET | Freq: Every day | ORAL | Status: DC
  Administered 2015-03-17 – 2015-03-20 (×4): 60 mg via ORAL
  Filled 2015-03-16 (×4): qty 1

## 2015-03-16 MED ORDER — OMEGA-3-ACID ETHYL ESTERS 1 G PO CAPS
1.0000 g | ORAL_CAPSULE | Freq: Every day | ORAL | Status: DC
  Administered 2015-03-17 – 2015-03-20 (×4): 1 g via ORAL
  Filled 2015-03-16 (×4): qty 1

## 2015-03-16 MED ORDER — FUROSEMIDE 20 MG PO TABS
20.0000 mg | ORAL_TABLET | Freq: Every day | ORAL | Status: DC
  Administered 2015-03-17 – 2015-03-20 (×4): 20 mg via ORAL
  Filled 2015-03-16 (×4): qty 1

## 2015-03-16 MED ORDER — SODIUM CHLORIDE 0.9 % IV BOLUS (SEPSIS)
500.0000 mL | Freq: Once | INTRAVENOUS | Status: AC
  Administered 2015-03-16: 500 mL via INTRAVENOUS

## 2015-03-16 MED ORDER — AMIODARONE HCL IN DEXTROSE 360-4.14 MG/200ML-% IV SOLN
30.0000 mg/h | INTRAVENOUS | Status: DC
  Filled 2015-03-16 (×2): qty 200

## 2015-03-16 MED ORDER — ACETAMINOPHEN 325 MG PO TABS
650.0000 mg | ORAL_TABLET | ORAL | Status: DC | PRN
  Filled 2015-03-16: qty 2

## 2015-03-16 MED ORDER — DIPHENHYDRAMINE HCL 50 MG/ML IJ SOLN: 50.0000 mg | Freq: Once | INTRAMUSCULAR | Status: DC

## 2015-03-16 MED ORDER — CARVEDILOL 6.25 MG PO TABS
6.2500 mg | ORAL_TABLET | Freq: Two times a day (BID) | ORAL | Status: DC
  Administered 2015-03-16 – 2015-03-20 (×8): 6.25 mg via ORAL
  Filled 2015-03-16 (×9): qty 1

## 2015-03-16 MED ORDER — AMIODARONE HCL IN DEXTROSE 360-4.14 MG/200ML-% IV SOLN
60.0000 mg/h | INTRAVENOUS | Status: DC
  Filled 2015-03-16: qty 200

## 2015-03-16 MED ORDER — ROSUVASTATIN CALCIUM 40 MG PO TABS
40.0000 mg | ORAL_TABLET | Freq: Every day | ORAL | Status: DC
  Administered 2015-03-17 – 2015-03-19 (×3): 40 mg via ORAL
  Filled 2015-03-16 (×4): qty 1

## 2015-03-16 MED ORDER — NICOTINE 14 MG/24HR TD PT24
14.0000 mg | MEDICATED_PATCH | Freq: Every day | TRANSDERMAL | Status: DC
  Administered 2015-03-17 – 2015-03-19 (×3): 14 mg via TRANSDERMAL
  Filled 2015-03-16 (×4): qty 1

## 2015-03-16 MED ORDER — AMIODARONE LOAD VIA INFUSION
150.0000 mg | Freq: Once | INTRAVENOUS | Status: DC
Start: 2015-03-16 — End: 2015-03-17
  Filled 2015-03-16: qty 83.34

## 2015-03-16 MED ORDER — SODIUM CHLORIDE 0.9 % IV SOLN
20.0000 mL | INTRAVENOUS | Status: DC
  Administered 2015-03-16: 20 mL via INTRAVENOUS

## 2015-03-16 MED ORDER — METHYLPREDNISOLONE SODIUM SUCC 125 MG IJ SOLR: 125.0000 mg | Freq: Once | INTRAMUSCULAR | Status: DC

## 2015-03-16 NOTE — H&P (Signed)
Rodney [name] is an 74 y.o. male.    Primary Cardiologist:Dr. Debara Pickett PCP: [name], MD  Chief Complaint: SOB began yesterday.  HPI:    74 year old male presents to ER with SOB.  He has hx of HTN, hyperlipidemia, CAD, PAF, s/p AAA repair.  This is 4th visit in 33 days.  Originally 03/02/15 with Lt side numbness while waiting for cardiac cath after abnormal nuc study and Eliquis held for procedure- Dx with TIA. Cardiac cath then postponed to recover from TIA.  Readmitted 03/02/15 for SOB - he was in afib with controlled rate,   And on recent echo there was description of mitral regurgitation on his last echo. There was question of a ruptured chordae. also decreased EF to 35-40%.  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 Multivessel coronary obstructive disease with luminal irregularity and proximal LAD narrowings of 30-40%; ramus intermediate/high OM, proximal and mid stenoses of 95 and 90%; 30% narrowing in the proximal AV groove circumflex with distal 80% stenosis in the larger distal branch with 70% narrowing in the smaller branch; and luminal irregularity of the RCA with 20-40% mid and distal stenoses.  Widely patent abdominal aortic iliac stent graft. 03/04/15  He then underwent PTCA/BMS to a subtotal ramus intermediate vessel with the entire proximal region being reduced to 0% in an intermediate vessel which extended to the LV apex once opened; and Angiosculpt/PTCA of bifurcation stenosis involving the distal circumflex flex marginal branch and Angiosculpt/BM stenting of a dominant distal circumflex extending into the PD/PLA vessel.  Pt had recurrent chest pain on 03/05/15 with elevated BP, on IV NTG pain resolved, improved and by the 25th ready for discharge.  Readmitted on the 26th for HTN urgency.  BP 210/110.  Coreg adjusted. Chest pain thought to be due to HTN.  He was discharged  03/09/15  Troponin was elevated at 0.14 thought to be due to HTN.  He then called with pain on the 29th and coreg decreased to 6.25 BID.   Today he presents with DOE.  No chest pain. He had DOE with walking further yesterday but slept well and this AM just walking from deck to inside he was SOB.  Here in ER he is noted to have A fib with RVR.  Past Medical History  Diagnosis Date  . Hypertension   . Hyperlipidemia   . Gout   . CAD (coronary artery disease)     a. cath 2010 b. CAD s/p PCI to ramus and LCX on 03/04/15  . AAA (abdominal aortic aneurysm)     stent graft put in 02/2009  . Arthritis   . Paroxysmal atrial fibrillation   . Tobacco abuse     Past Surgical History  Procedure Laterality Date  . Abdominal aortic aneurysm repair  02/19/2009    performed by [facility]  . Colonoscopy    . Knee arthroscopy  2003    left  . Shoulder arthroscopy with rotator cuff repair and subacromial decompression Left 11/19/2013    Procedure: LEFT SHOULDER ARTHROSCOPY DEBRIDEMENT EXTENTSIVE DISTAL CLAVICULECTOMY DECOMPRESSION PARTIAL ACROMIOPLASTY WITH CORACOACROMIAL WITH ROTATOR CUFF REPAIR ;  Surgeon: Renette Butters, MD;  Location: [facility];  Service: Orthopedics;  Laterality: Left;  . Left and right heart catheterization with coronary angiogram N/A 03/03/2015    Procedure: LEFT AND RIGHT HEART CATHETERIZATION WITH CORONARY ANGIOGRAM;  Surgeon:  Troy Sine, MD;    . Percutaneous coronary stent intervention (pci-s) N/A 03/04/2015    Procedure: PERCUTANEOUS CORONARY STENT INTERVENTION (PCI-S);  Surgeon: Troy Sine, MD; RI 99>>0% w/   BMS, bifurcation CFX-OM 90>>40% w/ Angiosculpt PTCA; dCFX 90>>0% w/   2.7520 mm Rebel BMS    Family History  Problem Relation Age of Onset  . Diabetes Sister   . Hypertension Sister   . Cancer Father     prostate  . Hypertension Mother   . Stroke Neg Hx   . Heart attack Neg Hx    Social History:  reports that he has been smoking Cigarettes.  He  has a 25 pack-year smoking history. He has never used smokeless tobacco. He reports that he drinks about 4.2 - 4.8 oz of alcohol per week. He reports that he does not use illicit drugs.  Allergies:  Allergies  Allergen Reactions  . Iohexol      Code: RASH, Desc: PAITENT STATED THAT HE BEGAN ITCHING TOWARDS END OF INJECTIN OF IV CONTRAST-- 13 HR PREP RECOMMENDED/MMS   . Lisinopril Cough    OUTPATIENT MEDS: No current facility-administered medications on file prior to encounter.   Current Outpatient Prescriptions on File Prior to Encounter  Medication Sig Dispense Refill  . apixaban (ELIQUIS) 5 MG TABS tablet Take 1 tablet (5 mg total) by mouth 2 (two) times daily. 60 tablet 6  . clopidogrel (PLAVIX) 75 MG tablet Take 1 tablet (75 mg total) by mouth daily with breakfast. 30 tablet 0  . CRESTOR 40 MG tablet Take 40 mg by mouth daily.  4  . famotidine (PEPCID) 40 MG tablet Take 1 tablet (40 mg total) by mouth 2 (two) times daily. 30 tablet 3  . fish oil-omega-3 fatty acids 1000 MG capsule Take 1 g by mouth daily.     . furosemide (LASIX) 20 MG tablet Take 1 tablet (20 mg total) by mouth daily. 30 tablet 0  . irbesartan (AVAPRO) 300 MG tablet Take 1 tablet (300 mg total) by mouth daily. 30 tablet 11  . isosorbide mononitrate (IMDUR) 60 MG 24 hr tablet Take 1 tablet (60 mg total) by mouth daily. 30 tablet 11  . Multiple Vitamin (MULTIVITAMIN) capsule Take 1 capsule by mouth daily.    . nicotine (NICODERM CQ - DOSED IN MG/24 HOURS) 14 mg/24hr patch Place 1 patch (14 mg total) onto the skin daily. 28 patch 0  . nitroGLYCERIN (NITROSTAT) 0.4 MG SL tablet Place 1 tablet (0.4 mg total) under the tongue every 5 (five) minutes as needed for chest pain (CP or SOB). 25 tablet 12  . potassium chloride (K-DUR) 10 MEQ tablet Take 1 tablet (10 mEq total) by mouth daily. 30 tablet 0  . aspirin 81 MG tablet Take 1 tablet (81 mg total) by mouth daily. Hold while on Plavix, restart after Plavix completed.  (Patient not taking: Reported on 03/16/2015) 30 tablet   . carvedilol (COREG) 12.5 MG tablet Take 1 tablet (12.5 mg total) by mouth 2 (two) times daily with a meal. (Patient not taking: Reported on 03/16/2015) 60 tablet 11  coreg is 6.25 mg BID.     Results for orders placed or performed during the hospital encounter of 03/16/15 (from the past 48 hour(s))  CBC     Status: Abnormal   Collection Time: 03/16/15 11:05 AM  Result Value Ref Range   WBC 3.9 (L) 4.0 - 10.5 K/uL   RBC 4.86 4.22 - 5.81 MIL/uL   Hemoglobin 15.3  13.0 - 17.0 g/dL   HCT 44.7 39.0 - 52.0 %   MCV 92.0 78.0 - 100.0 fL   MCH 31.5 26.0 - 34.0 pg   MCHC 34.2 30.0 - 36.0 g/dL   RDW 13.2 11.5 - 15.5 %   Platelets 146 (L) 150 - 400 K/uL  Basic metabolic panel     Status: Abnormal   Collection Time: 03/16/15 11:05 AM  Result Value Ref Range   Sodium 139 135 - 145 mmol/L   Potassium 4.5 3.5 - 5.1 mmol/L   Chloride 108 96 - 112 mmol/L   CO2 23 19 - 32 mmol/L   Glucose, Bld 126 (H) 70 - 99 mg/dL   BUN 13 6 - 23 mg/dL   Creatinine, Ser 1.39 (H) 0.50 - 1.35 mg/dL   Calcium 9.3 8.4 - 10.5 mg/dL   GFR calc non Af Amer 49 (L) >90 mL/min   GFR calc Af Amer 57 (L) >90 mL/min    Comment: (NOTE) The eGFR has been calculated using the CKD EPI equation. This calculation has not been validated in all clinical situations. eGFR's persistently <90 mL/min signify possible Chronic Kidney Disease.    Anion gap 8 5 - 15  BNP (order ONLY if patient complains of dyspnea/SOB AND you have documented it for THIS visit)     Status: Abnormal   Collection Time: 03/16/15 11:05 AM  Result Value Ref Range   B Natriuretic Peptide 164.4 (H) 0.0 - 100.0 pg/mL  I-stat troponin, ED (not at [facility])     Status: None   Collection Time: 03/16/15 11:22 AM  Result Value Ref Range   Troponin i, poc 0.00 0.00 - 0.08 ng/mL   Comment 3            Comment: Due to the release kinetics of cTnI, a negative result within the first hours of the onset of symptoms  does not rule out myocardial infarction with certainty. If myocardial infarction is still suspected, repeat the test at appropriate intervals.   APTT     Status: None   Collection Time: 03/16/15 12:09 PM  Result Value Ref Range   aPTT 32 24 - 37 seconds  Comprehensive metabolic panel     Status: Abnormal   Collection Time: 03/16/15 12:09 PM  Result Value Ref Range   Sodium 139 135 - 145 mmol/L   Potassium 4.6 3.5 - 5.1 mmol/L   Chloride 110 96 - 112 mmol/L   CO2 22 19 - 32 mmol/L   Glucose, Bld 108 (H) 70 - 99 mg/dL   BUN 13 6 - 23 mg/dL   Creatinine, Ser 1.37 (H) 0.50 - 1.35 mg/dL   Calcium 9.1 8.4 - 10.5 mg/dL   Total Protein 6.7 6.0 - 8.3 g/dL   Albumin 3.8 3.5 - 5.2 g/dL   AST 24 0 - 37 U/L   ALT 30 0 - 53 U/L   Alkaline Phosphatase 58 39 - 117 U/L   Total Bilirubin 0.6 0.3 - 1.2 mg/dL   GFR calc non Af Amer 50 (L) >90 mL/min   GFR calc Af Amer 58 (L) >90 mL/min    Comment: (NOTE) The eGFR has been calculated using the CKD EPI equation. This calculation has not been validated in all clinical situations. eGFR's persistently <90 mL/min signify possible Chronic Kidney Disease.    Anion gap 7 5 - 15  Protime-INR     Status: None   Collection Time: 03/16/15 12:09 PM  Result Value Ref Range  Prothrombin Time 15.2 11.6 - 15.2 seconds   INR 1.19 0.00 - 1.49  Urinalysis, Routine w reflex microscopic     Status: None   Collection Time: 03/16/15 12:25 PM  Result Value Ref Range   Color, Urine YELLOW YELLOW   APPearance CLEAR CLEAR   Specific Gravity, Urine 1.009 1.005 - 1.030   pH 5.0 5.0 - 8.0   Glucose, UA NEGATIVE NEGATIVE mg/dL   Hgb urine dipstick NEGATIVE NEGATIVE   Bilirubin Urine NEGATIVE NEGATIVE   Ketones, ur NEGATIVE NEGATIVE mg/dL   Protein, ur NEGATIVE NEGATIVE mg/dL   Urobilinogen, UA 0.2 0.0 - 1.0 mg/dL   Nitrite NEGATIVE NEGATIVE   Leukocytes, UA NEGATIVE NEGATIVE    Comment: MICROSCOPIC NOT DONE ON URINES WITH NEGATIVE PROTEIN, BLOOD, LEUKOCYTES,  NITRITE, OR GLUCOSE <1000 mg/dL.  I-stat troponin, ED (not at [facility])     Status: None   Collection Time: 03/16/15 12:37 PM  Result Value Ref Range   Troponin i, poc 0.01 0.00 - 0.08 ng/mL   Comment 3            Comment: Due to the release kinetics of cTnI, a negative result within the first hours of the onset of symptoms does not rule out myocardial infarction with certainty. If myocardial infarction is still suspected, repeat the test at appropriate intervals.    Dg Chest 2 View (if Patient Has Fever And/or Copd)  03/16/2015   CLINICAL DATA:  Shortness of breath, acute, initial encounter.  EXAM: CHEST  2 VIEW  COMPARISON:  03/07/2015.  FINDINGS: Trachea is midline. Heart size normal. Minimal linear opacification at the base of the left hemi thorax. No airspace consolidation or pleural fluid. Degenerative changes are seen in the spine.  IMPRESSION: Probable left basilar atelectasis and/or scarring.   Electronically Signed   By: Lorin Picket M.D.   On: 03/16/2015 12:13    ROS: General:no colds or fevers, no weight changes Skin:no rashes or ulcers HEENT:no blurred vision, no congestion CV:see HPI PUL:see HPI GI:no diarrhea constipation or melena, no indigestion GU:no hematuria, no dysuria MS:no joint pain, no claudication Neuro:no syncope, no lightheadedness Endo:no diabetes, no thyroid disease   Blood pressure 112/75, pulse 79, temperature 97.7 F (36.5 C), temperature source Oral, resp. rate 16, height '6\' 2"'  (1.88 m), weight 205 lb (92.987 kg), SpO2 98 %. PE: General:Pleasant affect, NAD Skin:Warm and dry, brisk capillary refill HEENT:normocephalic, sclera clear, mucus membranes moist Neck:supple, no JVD, no bruits  Heart:S1S2 irreg irreg  without murmur, gallup, rub or click Lungs:clear without rales, rhonchi, or wheezes FIE:PPIR, non tender, + BS, do not palpate liver spleen or masses Ext:no lower ext edema, 2+ pedal pulses, 2+ radial pulses Neuro:alert and oriented X 3, MAE,  follows commands, + facial symmetry    Assessment/Plan 1. DOE secondary to A fib with RVR, ADJUST MEDS.   2. A fib with RVR on coreg 6.25 BID ( previous PAF in Feb 2016 then another episode March 2016)  3. Recent Hx HTN urgency controlled today  4.  CAD with recent BMS and angio sculpting  5.  Anticoagulation CHAD2S2VASc score 6   6. HLD-crestor 40 mg   7. Chronic combination systolic diastolic HF  On chronic lasix, euvolemic      JJOACZ,YSAYT R Nurse Practitioner Certified [place] Pager [phone] or after 5pm or weekends call [phone] 03/16/2015, 3:11 PM  Patient seen, examined. Available data reviewed. Agree with findings, assessment, and plan as outlined by Cecilie Kicks, NP. The  patient was independently interviewed and examined. Exam reveals an alert, oriented male in no distress. Jugular venous pressure is normal. Lungs are clear bilaterally. Heart is irregularly irregular and tachycardic. There is no murmur or gallop. There is no peripheral edema present. Telemetry reveals atrial fibrillation with RVR with abrupt changes in his heart rate from the 80 bpm range up to 150 bpm.  The patient's symptoms are most consistent with recurrent symptomatic atrial fibrillation. He reports New [place] Heart Association functional class III symptoms of heart failure, which I suspect are primarily driven by his atrial fibrillation. He did require sublingual nitroglycerin today as well, but breathlessness has been his primary complaint. He has now had multiple episodes of atrial fibrillation with associated symptoms. He is tolerating anticoagulation with Eliquis. I think antiarrhythmic therapy is indicated. Options are somewhat limited in the setting of coronary artery disease, congestive heart failure, and moderate LV dysfunction with an EF 35-40%. Would recommend a trial of amiodarone as a strategy at rhythm control. Other long-term considerations might be Tikosyn or Sotalol,  but as he currently has AF with RVR I think amiodarone is our best option at present. Will start him on IV amiodarone tonight and plan on transition to oral amiodarone tomorrow.   Sherren Mocha, M.D. 03/16/2015 4:59 PM

## 2015-03-16 NOTE — ED Provider Notes (Signed)
CSN: [id number]     Arrival date & time 03/16/15  1020 History   First MD Initiated Contact with Patient 03/16/15 1108     Chief Complaint  Patient presents with  . Shortness of Breath     (Consider location/radiation/quality/duration/timing/severity/associated sxs/prior Treatment) HPI   74 year old male history of coronary artery disease status post PCI on March 23 presenting today with shortness of breath. He states this is similar to the symptoms he presented with prior to his PCI. He became dyspneic last night about 6:00. The symptoms have waxed and waned. He denies any chest pain but states he has had some tightness in his chest. Currently he feels improved here in the bed. He has been taking his medications as instructed. He has also recently diagnosed with paroxysmal atrial fibrillation and his heart rate is 110 on arrival here in the emergency department. He denies any fever, cough, sputum production, nausea, vomiting, or diarrhea. He denies any recent swelling in his legs, history of DVT, or PE.  Past Medical History  Diagnosis Date  . Hypertension   . Hyperlipidemia   . Gout   . CAD (coronary artery disease)     a. cath 2010 b. CAD s/p PCI to ramus and LCX on 03/04/15  . AAA (abdominal aortic aneurysm)     stent graft put in 02/2009  . Arthritis   . Paroxysmal atrial fibrillation   . Tobacco abuse    Past Surgical History  Procedure Laterality Date  . Abdominal aortic aneurysm repair  02/19/2009    performed by [facility]  . Colonoscopy    . Knee arthroscopy  2003    left  . Shoulder arthroscopy with rotator cuff repair and subacromial decompression Left 11/19/2013    Procedure: LEFT SHOULDER ARTHROSCOPY DEBRIDEMENT EXTENTSIVE DISTAL CLAVICULECTOMY DECOMPRESSION PARTIAL ACROMIOPLASTY WITH CORACOACROMIAL WITH ROTATOR CUFF REPAIR ;  Surgeon: Renette Butters, MD;  Location: [place];  Service: Orthopedics;  Laterality: Left;  . Left and right heart catheterization  with coronary angiogram N/A 03/03/2015    Procedure: LEFT AND RIGHT HEART CATHETERIZATION WITH CORONARY ANGIOGRAM;  Surgeon: Troy Sine, MD;    . Percutaneous coronary stent intervention (pci-s) N/A 03/04/2015    Procedure: PERCUTANEOUS CORONARY STENT INTERVENTION (PCI-S);  Surgeon: Troy Sine, MD; RI 99>>0% w/   BMS, bifurcation CFX-OM 90>>40% w/ Angiosculpt PTCA; dCFX 90>>0% w/   2.7520 mm Rebel BMS   Family History  Problem Relation Age of Onset  . Diabetes Sister   . Hypertension Sister   . Cancer Father     prostate  . Hypertension Mother   . Stroke Neg Hx   . Heart attack Neg Hx    History  Substance Use Topics  . Smoking status: Current Every Day Smoker -- 0.50 packs/day for 50 years    Types: Cigarettes  . Smokeless tobacco: Never Used  . Alcohol Use: 4.2 - 4.8 oz/week    6 Cans of beer, 1-2 Shots of liquor per week     Comment: couple shots of royal per day    Review of Systems  All other systems reviewed and are negative.     Allergies  Iohexol and Lisinopril  Home Medications   Prior to Admission medications   Medication Sig Start Date End Date Taking? Authorizing Provider  apixaban (ELIQUIS) 5 MG TABS tablet Take 1 tablet (5 mg total) by mouth 2 (two) times daily. 01/29/15   [name], MD  aspirin 81 MG tablet Take  1 tablet (81 mg total) by mouth daily. Hold while on Plavix, restart after Plavix completed. 03/06/15   Evelene Croon Barrett, PA-C  carvedilol (COREG) 12.5 MG tablet Take 1 tablet (12.5 mg total) by mouth 2 (two) times daily with a meal. 03/09/15   Brett Canales, PA-C  clopidogrel (PLAVIX) 75 MG tablet Take 1 tablet (75 mg total) by mouth daily with breakfast. 03/06/15   Evelene Croon Barrett, PA-C  CRESTOR 40 MG tablet Take 40 mg by mouth daily. 11/05/14   Historical Provider, MD  famotidine (PEPCID) 40 MG tablet Take 1 tablet (40 mg total) by mouth 2 (two) times daily. 03/06/15   Rhonda G Barrett, PA-C  fish oil-omega-3 fatty acids 1000 MG capsule  Take 1 g by mouth daily.     Historical Provider, MD  furosemide (LASIX) 20 MG tablet Take 1 tablet (20 mg total) by mouth daily. 01/05/15   Robbie Lis, MD  irbesartan (AVAPRO) 300 MG tablet Take 1 tablet (300 mg total) by mouth daily. 03/09/15   Brett Canales, PA-C  isosorbide mononitrate (IMDUR) 60 MG 24 hr tablet Take 1 tablet (60 mg total) by mouth daily. 03/06/15   Evelene Croon Barrett, PA-C  Multiple Vitamin (MULTIVITAMIN) capsule Take 1 capsule by mouth daily.    Historical Provider, MD  nicotine (NICODERM CQ - DOSED IN MG/24 HOURS) 14 mg/24hr patch Place 1 patch (14 mg total) onto the skin daily. 02/21/15   Barton Dubois, MD  nitroGLYCERIN (NITROSTAT) 0.4 MG SL tablet Place 1 tablet (0.4 mg total) under the tongue every 5 (five) minutes as needed for chest pain (CP or SOB). 03/09/15   Brett Canales, PA-C  potassium chloride (K-DUR) 10 MEQ tablet Take 1 tablet (10 mEq total) by mouth daily. 01/05/15   Robbie Lis, MD   BP 104/59 mmHg  Pulse 136  Temp(Src) 97.7 F (36.5 C) (Oral)  Resp 16  Ht 6\' 2"  (1.88 m)  Wt 205 lb (92.987 kg)  BMI 26.31 kg/m2  SpO2 97% Physical Exam  Constitutional: He is oriented to person, place, and time. He appears well-developed and well-nourished.  HENT:  Head: Normocephalic and atraumatic.  Right Ear: External ear normal.  Left Ear: External ear normal.  Nose: Nose normal.  Mouth/Throat: Oropharynx is clear and moist.  Eyes: Conjunctivae and EOM are normal. Pupils are equal, round, and reactive to light.  Neck: Normal range of motion. Neck supple.  Cardiovascular: Normal heart sounds and intact distal pulses.  An irregularly irregular rhythm present. Tachycardia present.   Pulmonary/Chest: Effort normal and breath sounds normal. No respiratory distress. He has no wheezes. He exhibits no tenderness.  Decreased bs right base   Abdominal: Soft. Bowel sounds are normal. He exhibits no distension and no mass. There is no tenderness. There is no guarding.   Musculoskeletal: Normal range of motion.  Neurological: He is alert and oriented to person, place, and time. He has normal reflexes. He exhibits normal muscle tone. Coordination normal.  Skin: Skin is warm and dry.  Psychiatric: He has a normal mood and affect. His behavior is normal. Judgment and thought content normal.  Nursing note and vitals reviewed.   ED Course  Procedures (including critical care time) Labs Review Labs Reviewed  [place], ED    Imaging Review No results found.   EKG Interpretation   Date/Time:  Monday March 16 2015 11:24:58 EDT Ventricular Rate:  114 PR Interval:  QRS Duration: 98 QT Interval:  328 QTC Calculation: 452 R Axis:   6 Text Interpretation:  Atrial fibrillation Repol abnrm suggests ischemia,  anterolateral Confirmed by Verlin Duke MD, Raeden Schippers ([id number]) on 03/16/2015 11:29:49  AM      MDM   Final diagnoses:  Atrial fibrillation with RVR  Dyspnea    74 y.o. Male with known cad , paroxysmal a fib on multiple medications presents today with dyspnea, a fib rvr, st depression lateral leads.  Patient has been very hypertensive and now bp 104/59.  Plan 500 cc fluid bolus. Patient with multiple recent treatment from cardiology and will consult for recomendations.    Cardiology evaluated and admitted    Pattricia Boss, MD 03/17/15 [phone]

## 2015-03-16 NOTE — ED Notes (Signed)
Pt c/o shortness of breath onset yesterday afternoon. Pt talking in complete sentences without difficulty. No swelling in feet or ankles.

## 2015-03-17 DIAGNOSIS — R0609 Other forms of dyspnea: Secondary | ICD-10-CM

## 2015-03-17 DIAGNOSIS — I2510 Atherosclerotic heart disease of native coronary artery without angina pectoris: Secondary | ICD-10-CM

## 2015-03-17 LAB — BASIC METABOLIC PANEL
ANION GAP: 4 — AB (ref 5–15)
Anion gap: 10 (ref 5–15)
BUN: 13 mg/dL (ref 6–23)
BUN: 12 mg/dL (ref 6–23)
CALCIUM: 9 mg/dL (ref 8.4–10.5)
CALCIUM: 9.1 mg/dL (ref 8.4–10.5)
CHLORIDE: 109 mmol/L (ref 96–112)
CO2: 22 mmol/L (ref 19–32)
CO2: 20 mmol/L (ref 19–32)
Chloride: 109 mmol/L (ref 96–112)
Creatinine, Ser: 1.23 mg/dL (ref 0.50–1.35)
Creatinine, Ser: 1.32 mg/dL (ref 0.50–1.35)
GFR calc Af Amer: 61 mL/min — ABNORMAL LOW (ref >90)
GFR calc non Af Amer: 57 mL/min — ABNORMAL LOW (ref >90)
GFR calc non Af Amer: 52 mL/min — ABNORMAL LOW (ref >90)
GFR, EST AFRICAN AMERICAN: 66 mL/min — AB (ref >90)
Glucose, Bld: 103 mg/dL — ABNORMAL HIGH (ref 70–99)
Glucose, Bld: 139 mg/dL — ABNORMAL HIGH (ref 70–99)
Potassium: 3.9 mmol/L (ref 3.5–5.1)
Potassium: 3.8 mmol/L (ref 3.5–5.1)
SODIUM: 139 mmol/L (ref 135–145)
Sodium: 135 mmol/L (ref 135–145)

## 2015-03-17 LAB — CBC
HEMATOCRIT: 38.9 % — AB (ref 39.0–52.0)
HEMOGLOBIN: 13.7 g/dL (ref 13.0–17.0)
MCH: 32.1 pg (ref 26.0–34.0)
MCHC: 35.2 g/dL (ref 30.0–36.0)
MCV: 91.1 fL (ref 78.0–100.0)
Platelets: 116 10*3/uL — ABNORMAL LOW (ref 150–400)
RBC: 4.27 MIL/uL (ref 4.22–5.81)
RDW: 13.3 % (ref 11.5–15.5)
WBC: 4.1 10*3/uL (ref 4.0–10.5)

## 2015-03-17 LAB — T4, FREE: FREE T4: 0.96 ng/dL (ref 0.80–1.80)

## 2015-03-17 LAB — MAGNESIUM: MAGNESIUM: 2 mg/dL (ref 1.5–2.5)

## 2015-03-17 LAB — TROPONIN I: Troponin I: <0.03 ng/mL (ref <0.031)

## 2015-03-17 MED ORDER — POTASSIUM CHLORIDE CRYS ER 20 MEQ PO TBCR
40.0000 meq | EXTENDED_RELEASE_TABLET | Freq: Once | ORAL | Status: AC
  Administered 2015-03-17: 40 meq via ORAL
  Filled 2015-03-17: qty 2

## 2015-03-17 MED ORDER — DOFETILIDE 500 MCG PO CAPS
500.0000 ug | ORAL_CAPSULE | Freq: Two times a day (BID) | ORAL | Status: DC
  Administered 2015-03-17 – 2015-03-18 (×2): 500 ug via ORAL
  Filled 2015-03-17 (×4): qty 1

## 2015-03-17 MED ORDER — SODIUM CHLORIDE 0.9 % IJ SOLN: 3.0000 mL | INTRAMUSCULAR | Status: DC | PRN

## 2015-03-17 MED ORDER — AMIODARONE HCL 200 MG PO TABS: 400.0000 mg | ORAL_TABLET | Freq: Two times a day (BID) | ORAL | Status: DC

## 2015-03-17 MED ORDER — SODIUM CHLORIDE 0.9 % IV SOLN: 250.0000 mL | INTRAVENOUS | Status: DC | PRN

## 2015-03-17 MED ORDER — SODIUM CHLORIDE 0.9 % IJ SOLN
3.0000 mL | Freq: Two times a day (BID) | INTRAMUSCULAR | Status: DC
  Administered 2015-03-17 – 2015-03-20 (×6): 3 mL via INTRAVENOUS

## 2015-03-17 NOTE — Progress Notes (Signed)
Utilization review completed.  

## 2015-03-17 NOTE — Progress Notes (Signed)
Subjective: Pt without complaints  Objective: Vital signs in last 24 hours: Temp:  [97.2 F (36.2 C)-98.1 F (36.7 C)] 98.1 F (36.7 C) (04/05 0515) Pulse Rate:  [47-106] 66 (04/05 0515) Resp:  [11-25] 20 (04/05 0515) BP: (100-156)/(65-96) 131/77 mmHg (04/05 0515) SpO2:  [92 %-100 %] 94 % (04/05 0515) Weight:  [204 lb (92.534 kg)] 204 lb (92.534 kg) (04/04 1825) Weight change:  Last BM Date: 03/15/15 Intake/Output from previous day: +598   Intake/Output this shift: Total I/O In: 598 [P.O.:598] Out: -   PE: General:Pleasant affect, NAD Skin:Warm and dry, brisk capillary refill HEENT:normocephalic, sclera clear, mucus membranes moist Neck:supple, no JVD, no bruits  Heart:S1S2 RRR without murmur, gallup, rub or click Lungs:clear without rales, rhonchi, or wheezes JGG:EZMO, non tender, + BS, do not palpate liver spleen or masses Ext:no lower ext edema, 2+ pedal pulses, 2+ radial pulses Neuro:alert and oriented, MAE, follows commands, + facial symmetry Tele:  SR    Lab Results:  Recent Labs  03/16/15 1105 03/17/15 0550  WBC 3.9* 4.1  HGB 15.3 13.7  HCT 44.7 38.9*  PLT 146* 116*   BMET  Recent Labs  03/16/15 1209 03/17/15 0550  NA 139 135  K 4.6 3.9  CL 110 109  CO2 22 22  GLUCOSE 108* 103*  BUN 13 13  CREATININE 1.37* 1.23  CALCIUM 9.1 9.0    Recent Labs  03/16/15 2157 03/17/15 0550  TROPONINI <0.03 <0.03    Lab Results  Component Value Date   CHOL 125 02/21/2015   HDL 43 02/21/2015   LDLCALC 47 02/21/2015   TRIG 173* 02/21/2015   CHOLHDL 2.9 02/21/2015   Lab Results  Component Value Date   HGBA1C 5.5 02/21/2015     Lab Results  Component Value Date   TSH 0.407 03/16/2015    Hepatic Function Panel  Recent Labs  03/16/15 1209  PROT 6.7  ALBUMIN 3.8  AST 24  ALT 30  ALKPHOS 58  BILITOT 0.6   No results for input(s): CHOL in the last 72 hours. No results for input(s): PROTIME in the last 72  hours.     Studies/Results: Dg Chest 2 View (if Patient Has Fever And/or Copd)  03/16/2015   CLINICAL DATA:  Shortness of breath, acute, initial encounter.  EXAM: CHEST  2 VIEW  COMPARISON:  03/07/2015.  FINDINGS: Trachea is midline. Heart size normal. Minimal linear opacification at the base of the left hemi thorax. No airspace consolidation or pleural fluid. Degenerative changes are seen in the spine.  IMPRESSION: Probable left basilar atelectasis and/or scarring.   Electronically Signed   By: Lorin Picket M.D.   On: 03/16/2015 12:13    Medications: I have reviewed the patient's current medications. Scheduled Meds: . amiodarone  150 mg Intravenous Once  . apixaban  5 mg Oral Q12H  . aspirin  324 mg Oral Once  . carvedilol  6.25 mg Oral BID  . clopidogrel  75 mg Oral Q breakfast  . famotidine  40 mg Oral BID  . furosemide  20 mg Oral Daily  . irbesartan  300 mg Oral Daily  . isosorbide mononitrate  60 mg Oral Daily  . multivitamin with minerals  1 tablet Oral Daily  . nicotine  14 mg Transdermal Daily  . omega-3 acid ethyl esters  1 g Oral Daily  . potassium chloride  10 mEq Oral Daily  . rosuvastatin  40 mg Oral q1800   Continuous  Infusions: . sodium chloride 20 mL (03/16/15 1331)  . sodium chloride 10 mL/hr at 03/16/15 1702  . amiodarone     PRN Meds:.acetaminophen, nitroGLYCERIN, ondansetron (ZOFRAN) IV  Assessment/Plan: 1. DOE secondary to A fib with RVR, ADJUST MEDS. IV amiodarone was added- he converted to SR - and pt was never placed on IV amio or PO amiodarone.  Will review with Dr. Debara Pickett- one episode of NSVT-Tikosyn may be better.  He did not tolerate higher dose of BB  2. A fib with RVR on coreg 6.25 BID ( previous PAF in Feb 2016 then another episode March 2016) see above- did not tolerate higher doses of coreg.   3. Recent Hx HTN urgency controlled today on coreg 6.25 mg BID  4. CAD with recent BMS and angio sculpting  (Multivessel coronary obstructive disease  with luminal irregularity and proximal LAD narrowings of 30-40%; ramus intermediate/high OM, proximal and mid stenoses of 95 and 90%; 30% narrowing in the proximal AV groove circumflex with distal 80% stenosis in the larger distal branch with 70% narrowing in the smaller branch; and luminal irregularity of the RCA with 20-40% mid and distal stenoses. Widely patent abdominal aortic iliac stent graft. 03/04/15 He then underwent PTCA/BMS to a subtotal ramus intermediate vessel with the entire proximal region being reduced to 0% in an intermediate vessel which extended to the LV apex once opened; and Angiosculpt/PTCA of bifurcation stenosis involving the distal circumflex flex marginal branch and Angiosculpt/BM stenting of a dominant distal circumflex extending into the PD/PLA vessel.)  5. Anticoagulation CHAD2S2VASc score 6 on eliquis   6. HLD-crestor 40 mg   7. Chronic combination systolic diastolic HF On chronic lasix, euvolemic   8. TIA  02/20/15 in a fib  Ambulate , IV to saline lock possible d/c    LOS: 1 day   Time spent with pt. :15 minutes. [facility] R  Nurse Practitioner Certified Pager [phone] or after 5pm and on weekends call [phone] 03/17/2015, 11:06 AM

## 2015-03-17 NOTE — Progress Notes (Signed)
Pharmacy Consult for Dofetilide (Tikosyn) Initiation  Admit Complaint: 74 y.o. male admitted 03/16/2015 with atrial fibrillation to be initiated on dofetilide.   Assessment:  Patient Exclusion Criteria: If any screening criteria checked as "Yes", then  patient  should NOT receive dofetilide until criteria item is corrected. If "Yes" please indicate correction plan.  YES  NO Patient  Exclusion Criteria Correction Plan  []  [x]  Baseline QTc interval is greater than or equal to 440 msec. IF above YES box checked dofetilide contraindicated unless patient has ICD; then may proceed if QTc 500-550 msec or with known ventricular conduction abnormalities may proceed with QTc 550-600 msec. QTc = 0.44 441   []  [x]  Magnesium level is less than 1.8 mEq/l : Last magnesium:  Lab Results  Component Value Date   MG 2.0 03/17/2015         [x]  []  Potassium level is less than 4 mEq/l : Last potassium:  Lab Results  Component Value Date   K 3.8 03/17/2015       **The patient received a dose of 40 mEq at 1745 this afternoon - only~1 hour after the dose was given. Another 40 mEq dose has been ordered - MD wanted to continue with the dose tonight with additional KCl replacement.  .  []  [x]  Patient is known or suspected to have a digoxin level greater than 2 ng/ml: No results found for: DIGOXIN    []  [x]  Creatinine clearance less than 20 ml/min (calculated using Cockcroft-Gault, actual body weight and serum creatinine): Estimated Creatinine Clearance: 58.8 mL/min (by C-G formula based on Cr of 1.32).    []  [x]  Patient has received drugs known to prolong the QT intervals within the last 48 hours(phenothiazines, tricyclics or tetracyclic antidepressants, erythromycin, H-1 antihistamines, cisapride, fluoroquinolones, azithromycin). Drugs not listed above may have an, as yet, undetected potential to prolong the QT interval, updated information on QT prolonging agents is available at this website:QT prolonging  agents   []  [x]  Patient received a dose of hydrochlorothiazide (Oretic) alone or in any combination including triamterene (Dyazide, Maxzide) in the last 48 hours.   []  [x]  Patient received a medication known to increase dofetilide plasma concentrations prior to initial dofetilide dose:  . Trimethoprim (Primsol, Proloprim) in the last 36 hours . Verapamil (Calan, Verelan) in the last 36 hours or a sustained release dose in the last 72 hours . Megestrol (Megace) in the last 5 days  . Cimetidine (Tagamet) in the last 6 hours . Ketoconazole (Nizoral) in the last 24 hours . Itraconazole (Sporanox) in the last 48 hours  . Prochlorperazine (Compazine) in the last 36 hours    []  [x]  Patient is known to have a history of torsades de pointes; congenital or acquired long QT syndromes.   []  [x]  Patient has received a Class 1 antiarrhythmic with less than 2 half-lives since last dose. (Disopyramide, Quinidine, Procainamide, Lidocaine, Mexiletine, Flecainide, Propafenone)   []  [x]  Patient has received amiodarone therapy in the past 3 months or amiodarone level is greater than 0.3 ng/ml.    Patient has been appropriately anticoagulated with Apixaban.  Ordering provider was confirmed at [email or web address] if they are not listed on the [place] Prescribers list.  Goal of Therapy: Follow renal function, electrolytes, potential drug interactions, and dose adjustment. Provide education and 1 week supply at discharge.  Plan:  [x]   Physician selected initial dose within range recommended for patients level of renal function - will monitor for response.  []   Physician selected initial dose  outside of range recommended for patients level of renal function - will discuss if the dose should be altered at this time.   [facility] Calculated CrCl  Dose q12h  [x]  > 60 ml/min 500 mcg  []  40-60 ml/min 250 mcg  []  20-40 ml/min 125 mcg   2. Follow up QTc after the first 5 doses, renal function, electrolytes  (K & Mg) daily x 3     days, dose adjustment, success of initiation and facilitate 1 week discharge supply as     clinically indicated.  3. Initiate Tikosyn education video (Call [phone] and ask for video # [phone]).  4. Place Enrollment Form on the chart for discharge supply of dofetilide.   Alycia Rossetti, PharmD, BCPS Clinical Pharmacist Pager: [phone] 03/17/2015 8:24 PM

## 2015-03-18 DIAGNOSIS — E785 Hyperlipidemia, unspecified: Secondary | ICD-10-CM

## 2015-03-18 DIAGNOSIS — Z79899 Other long term (current) drug therapy: Secondary | ICD-10-CM

## 2015-03-18 DIAGNOSIS — Z5181 Encounter for therapeutic drug level monitoring: Secondary | ICD-10-CM

## 2015-03-18 LAB — BASIC METABOLIC PANEL
Anion gap: 7 (ref 5–15)
BUN: 13 mg/dL (ref 6–23)
CO2: 23 mmol/L (ref 19–32)
CREATININE: 1.28 mg/dL (ref 0.50–1.35)
Calcium: 9.2 mg/dL (ref 8.4–10.5)
Chloride: 108 mmol/L (ref 96–112)
GFR, EST AFRICAN AMERICAN: 63 mL/min — AB (ref >90)
GFR, EST NON AFRICAN AMERICAN: 54 mL/min — AB (ref >90)
Glucose, Bld: 109 mg/dL — ABNORMAL HIGH (ref 70–99)
Potassium: 4.3 mmol/L (ref 3.5–5.1)
Sodium: 138 mmol/L (ref 135–145)

## 2015-03-18 LAB — MAGNESIUM: Magnesium: 2.1 mg/dL (ref 1.5–2.5)

## 2015-03-18 MED ORDER — DOFETILIDE 250 MCG PO CAPS
250.0000 ug | ORAL_CAPSULE | Freq: Two times a day (BID) | ORAL | Status: DC
  Administered 2015-03-18 – 2015-03-20 (×4): 250 ug via ORAL
  Filled 2015-03-18 (×6): qty 1

## 2015-03-18 NOTE — Care Management Note (Signed)
    Page 1 of 1   03/18/2015     3:11:49 PM CARE MANAGEMENT NOTE 03/18/2015  Patient:  RAWN, QUIROA   Account Number:  [id number]  Date Initiated:  03/18/2015  Documentation initiated by:  Marvetta Gibbons  Subjective/Objective Assessment:   Pt admitted with afib- decision made for Tikosyn load     Action/Plan:   PTA pt lived at home   Anticipated DC Date:  03/21/2015   Anticipated DC Plan:  [place]  CM consult  Medication Assistance      Choice offered to / List presented to:             Status of service:  In process, will continue to follow Medicare Important Message given?   (If response is "NO", the following Medicare IM given date fields will be blank) Date Medicare IM given:   Medicare IM given by:   Date Additional Medicare IM given:   Additional Medicare IM given by:    Discharge Disposition:    Per UR Regulation:  Reviewed for med. necessity/level of care/duration of stay  If discussed at [place] of Stay Meetings, dates discussed:    Comments:  03/18/15- 1500- Marvetta Gibbons RN, BSN [phone] Referral for Tikosyn assistance- benefits check completed- PT COPAY WILL BE $100- PRIOR AUTH NOT REQUIRED--- spoke with pt at bedside -per pt he uses [facility] on [address]- call made to pharmacy and they are in [place] program and can order drug once prescription is received- pt will need 7 day supply from [facility]- MD please write scripts for both 7 day supply and refillable script for pt to take to his pharmacy.

## 2015-03-18 NOTE — Progress Notes (Signed)
Subjective: Pt without complaints. Remains in sinus. On Tikosyn loading. QTc is 506 msec, today.  Objective: Vital signs in last 24 hours: Temp:  [97.9 F (36.6 C)-98.3 F (36.8 C)] 98 F (36.7 C) (04/06 0447) Pulse Rate:  [56-66] 56 (04/06 0447) Resp:  [18-19] 18 (04/06 0447) BP: (114-123)/(66-67) 119/67 mmHg (04/06 0447) SpO2:  [97 %-98 %] 97 % (04/06 0447) Weight:  [206 lb 12.7 oz (93.8 kg)] 206 lb 12.7 oz (93.8 kg) (04/06 0447) Weight change: 1 lb 12.7 oz (0.813 kg) Last BM Date: 03/15/15 Intake/Output from previous day: +598 04/05 0701 - 04/06 0700 In: 1078 [P.O.:1078] Out: -  Intake/Output this shift:    PE: General:Pleasant affect, NAD Skin:Warm and dry, brisk capillary refill HEENT:normocephalic, sclera clear, mucus membranes moist Neck:supple, no JVD, no bruits  Heart:S1S2 RRR without murmur, gallup, rub or click Lungs:clear without rales, rhonchi, or wheezes AQT:MAUQ, non tender, + BS, do not palpate liver spleen or masses Ext:no lower ext edema, 2+ pedal pulses, 2+ radial pulses Neuro:alert and oriented, MAE, follows commands, + facial symmetry Tele:  SR    Lab Results:  Recent Labs  03/16/15 1105 03/17/15 0550  WBC 3.9* 4.1  HGB 15.3 13.7  HCT 44.7 38.9*  PLT 146* 116*   BMET  Recent Labs  03/17/15 1856 03/18/15 0551  NA 139 138  K 3.8 4.3  CL 109 108  CO2 20 23  GLUCOSE 139* 109*  BUN 12 13  CREATININE 1.32 1.28  CALCIUM 9.1 9.2    Recent Labs  03/16/15 2157 03/17/15 0550  TROPONINI <0.03 <0.03    Lab Results  Component Value Date   CHOL 125 02/21/2015   HDL 43 02/21/2015   LDLCALC 47 02/21/2015   TRIG 173* 02/21/2015   CHOLHDL 2.9 02/21/2015   Lab Results  Component Value Date   HGBA1C 5.5 02/21/2015     Lab Results  Component Value Date   TSH 0.407 03/16/2015    Hepatic Function Panel  Recent Labs  03/16/15 1209  PROT 6.7  ALBUMIN 3.8  AST 24  ALT 30  ALKPHOS 58  BILITOT 0.6   No results for  input(s): CHOL in the last 72 hours. No results for input(s): PROTIME in the last 72 hours.     Studies/Results: No results found.  Medications: I have reviewed the patient's current medications. Scheduled Meds: . apixaban  5 mg Oral Q12H  . carvedilol  6.25 mg Oral BID  . clopidogrel  75 mg Oral Q breakfast  . dofetilide  500 mcg Oral BID  . famotidine  40 mg Oral BID  . furosemide  20 mg Oral Daily  . irbesartan  300 mg Oral Daily  . isosorbide mononitrate  60 mg Oral Daily  . multivitamin with minerals  1 tablet Oral Daily  . nicotine  14 mg Transdermal Daily  . omega-3 acid ethyl esters  1 g Oral Daily  . potassium chloride  10 mEq Oral Daily  . rosuvastatin  40 mg Oral q1800  . sodium chloride  3 mL Intravenous Q12H   Continuous Infusions:   PRN Meds:.sodium chloride, acetaminophen, nitroGLYCERIN, ondansetron (ZOFRAN) IV, sodium chloride  Assessment/Plan: 1. DOE secondary to A fib with RVR, ADJUST MEDS. IV amiodarone was added- he converted to SR - and pt was never placed on IV amio or PO amiodarone.  Will review with Dr. Debara Pickett- one episode of NSVT-Tikosyn may be better.  He did not tolerate higher  dose of BB  2. A fib with RVR on coreg 6.25 BID ( previous PAF in Feb 2016 then another episode March 2016) see above- did not tolerate higher doses of coreg. Will initiate Tikosyn loading. QTc this am is 506 msec, down from 512 msec yesterday and 441 msec, prior. This represents <20% increase in QTC and it appears to be stable. Given he is over 500 msec, will decrease dose to 250 mcg daily.  3. Recent Hx HTN urgency controlled today on coreg 6.25 mg BID  4. CAD with recent BMS and angio sculpting  (Multivessel coronary obstructive disease with luminal irregularity and proximal LAD narrowings of 30-40%; ramus intermediate/high OM, proximal and mid stenoses of 95 and 90%; 30% narrowing in the proximal AV groove circumflex with distal 80% stenosis in the larger distal branch with  70% narrowing in the smaller branch; and luminal irregularity of the RCA with 20-40% mid and distal stenoses. Widely patent abdominal aortic iliac stent graft. 03/04/15 He then underwent PTCA/BMS to a subtotal ramus intermediate vessel with the entire proximal region being reduced to 0% in an intermediate vessel which extended to the LV apex once opened; and Angiosculpt/PTCA of bifurcation stenosis involving the distal circumflex flex marginal branch and Angiosculpt/BM stenting of a dominant distal circumflex extending into the PD/PLA vessel.)  5. Anticoagulation CHAD2S2VASc score 6 on eliquis   6. HLD-crestor 40 mg   7. Chronic combination systolic diastolic HF On chronic lasix, euvolemic   8. TIA  02/20/15 in a fib  [name], MD, [facility] Attending Cardiologist [facility]   LOS: 2 days  HILTY,Kenneth C 03/18/2015, 12:26 PM

## 2015-03-19 ENCOUNTER — Encounter: Payer: Medicare Other | Admitting: Cardiology

## 2015-03-19 LAB — BASIC METABOLIC PANEL
Anion gap: 10 (ref 5–15)
BUN: 14 mg/dL (ref 6–23)
CALCIUM: 9.3 mg/dL (ref 8.4–10.5)
CO2: 19 mmol/L (ref 19–32)
CREATININE: 1.11 mg/dL (ref 0.50–1.35)
Chloride: 109 mmol/L (ref 96–112)
GFR, EST AFRICAN AMERICAN: 75 mL/min — AB (ref >90)
GFR, EST NON AFRICAN AMERICAN: 64 mL/min — AB (ref >90)
Glucose, Bld: 103 mg/dL — ABNORMAL HIGH (ref 70–99)
POTASSIUM: 4.2 mmol/L (ref 3.5–5.1)
Sodium: 138 mmol/L (ref 135–145)

## 2015-03-19 LAB — MAGNESIUM: Magnesium: 2.1 mg/dL (ref 1.5–2.5)

## 2015-03-19 NOTE — Progress Notes (Signed)
Patient Profile: 74 y/o male with a history of recurrent symptomatic atrial fibrillation admitted for Tikosyn loading   Subjective: No complaints.   Objective: Vital signs in last 24 hours: Temp:  [97.5 F (36.4 C)-98.2 F (36.8 C)] 97.5 F (36.4 C) (04/07 0455) Pulse Rate:  [56-65] 65 (04/07 0455) Resp:  [18] 18 (04/07 0455) BP: (120-132)/(62-70) 120/70 mmHg (04/07 0455) SpO2:  [97 %-99 %] 99 % (04/07 0455) Weight:  [205 lb 11 oz (93.3 kg)] 205 lb 11 oz (93.3 kg) (04/07 0455) Last BM Date: 03/15/15  Intake/Output from previous day: 04/06 0701 - 04/07 0700 In: 480 [P.O.:480] Out: -  Intake/Output this shift:    Medications Current Facility-Administered Medications  Medication Dose Route Frequency Provider Last Rate Last Dose  . 0.9 %  sodium chloride infusion  250 mL Intravenous PRN [name], MD      . acetaminophen (TYLENOL) tablet 650 mg  650 mg Oral Q4H PRN Isaiah Serge, NP      . apixaban Arne Cleveland) tablet 5 mg  5 mg Oral Q12H Isaiah Serge, NP   5 mg at 03/18/15 2126  . carvedilol (COREG) tablet 6.25 mg  6.25 mg Oral BID Isaiah Serge, NP   6.25 mg at 03/18/15 2127  . clopidogrel (PLAVIX) tablet 75 mg  75 mg Oral Q breakfast Isaiah Serge, NP   75 mg at 03/18/15 7371  . dofetilide (TIKOSYN) capsule 250 mcg  250 mcg Oral BID [name], MD   250 mcg at 03/18/15 2126  . famotidine (PEPCID) tablet 40 mg  40 mg Oral BID Isaiah Serge, NP   40 mg at 03/18/15 2127  . furosemide (LASIX) tablet 20 mg  20 mg Oral Daily Isaiah Serge, NP   20 mg at 03/18/15 1127  . irbesartan (AVAPRO) tablet 300 mg  300 mg Oral Daily Isaiah Serge, NP   300 mg at 03/18/15 1127  . isosorbide mononitrate (IMDUR) 24 hr tablet 60 mg  60 mg Oral Daily Isaiah Serge, NP   60 mg at 03/18/15 1127  . multivitamin with minerals tablet 1 tablet  1 tablet Oral Daily Skeet Simmer, [facility]   1 tablet at 03/18/15 1127  . nicotine (NICODERM CQ - dosed in mg/24 hours) patch 14 mg  14 mg  Transdermal Daily Isaiah Serge, NP   14 mg at 03/18/15 1127  . nitroGLYCERIN (NITROSTAT) SL tablet 0.4 mg  0.4 mg Sublingual Q5 min PRN Isaiah Serge, NP      . omega-3 acid ethyl esters (LOVAZA) capsule 1 g  1 g Oral Daily Isaiah Serge, NP   1 g at 03/18/15 1130  . ondansetron (ZOFRAN) injection 4 mg  4 mg Intravenous Q6H PRN Isaiah Serge, NP      . potassium chloride (K-DUR) CR tablet 10 mEq  10 mEq Oral Daily Isaiah Serge, NP   10 mEq at 03/18/15 1127  . rosuvastatin (CRESTOR) tablet 40 mg  40 mg Oral q1800 Isaiah Serge, NP   40 mg at 03/18/15 1806  . sodium chloride 0.9 % injection 3 mL  3 mL Intravenous Q12H [name], MD   3 mL at 03/18/15 2127  . sodium chloride 0.9 % injection 3 mL  3 mL Intravenous PRN [name], MD        PE: General appearance: alert, cooperative and no distress Neck: no carotid bruit and no JVD Lungs: clear to  auscultation bilaterally Heart: regular rate and rhythm, S1, S2 normal, no murmur, click, rub or gallop Extremities: no LEE Pulses: 2+ and symmetric Skin: warm and dry Neurologic: Grossly normal  Lab Results:   Recent Labs  03/16/15 1105 03/17/15 0550  WBC 3.9* 4.1  HGB 15.3 13.7  HCT 44.7 38.9*  PLT 146* 116*   BMET  Recent Labs  03/17/15 1856 03/18/15 0551 03/19/15 0518  NA 139 138 138  K 3.8 4.3 4.2  CL 109 108 109  CO2 20 23 19   GLUCOSE 139* 109* 103*  BUN 12 13 14   CREATININE 1.32 1.28 1.11  CALCIUM 9.1 9.2 9.3   PT/INR  Recent Labs  03/16/15 1209  LABPROT 15.2  INR 1.19    Assessment/Plan  Principal Problem:   DOE (dyspnea on exertion) Active Problems:   S/P abdominal aortic aneurysm repair   Accelerated hypertension, hx of   CAD (coronary artery disease) stent place 03/04/15   HLD (hyperlipidemia)   History of TIA (transient ischemic attack), 02/20/15   Atrial fibrillation with RVR   1. Atrial Fibrillation: Maintaining NSR on Tikosyn. QT/QTc after 2nd dose of Tikosyn was <500 ms at  494/475. QT/QTc this am is 468/475. K is 4.2 and Mg is 2.1. Can continue current dose of Tikosyn. Continue Eliquis for anticoagulation.    2. CAD: s/p PTCA/BMS to a subtotal ramus intermediate vessel 03/04/15. Stable w/o CP. Continue medical therapy.   3. HTN: BP well controlled.   4. HLD: Continue statin therapy.    LOS: 3 days    Mohamud Mrozek M. Rosita Fire, PA-C 03/19/2015 8:28 AM

## 2015-03-19 NOTE — Discharge Instructions (Signed)
Dofetilide capsules °What is this medicine? °DOFETILIDE (doe FET il ide) is an antiarrhythmic drug. It helps make your heart beat regularly. This medicine also helps to slow rapid heartbeats. °This medicine may be used for other purposes; ask your health care provider or pharmacist if you have questions. °COMMON BRAND NAME(S): Tikosyn °What should I tell my health care provider before I take this medicine? °They need to know if you have any of these conditions: °-heart disease or heart failure °-history of low levels of potassium or magnesium °-kidney disease °-liver disease °-an unusual or allergic reaction to dofetilide, other medicines, foods, dyes, or preservatives °-pregnant or trying to get pregnant °-breast-feeding °How should I use this medicine? °Take this medicine by mouth with a glass of water. Follow the directions on the prescription label. You can take this medicine with or without food. Do not drink grapefruit juice with this medicine. Take your doses at regular intervals. Do not take your medicine more often than directed. Do not stop taking this medicine suddenly. This may cause serious, heart-related side effects. Your doctor will tell you how much medicine to take. If your doctor wants you to stop the medicine, the dose will be slowly lowered over time to avoid any side effects. °Talk to your pediatrician regarding the use of this medicine in children. Special care may be needed. °Overdosage: If you think you have taken too much of this medicine contact a poison control center or emergency room at once. °NOTE: This medicine is only for you. Do not share this medicine with others. °What if I miss a dose? °If you miss a dose, take it as soon as you can. If it is almost time for your next dose, take only that dose. Do not take double or extra doses. °What may interact with this medicine? °Do not take this medicine with any of the following medications: °-arsenic trioxide °-certain macrolide  antibiotics °-certain medicines for fungal infections like ketoconazole and itraconazole °-certain medicines for HIV, AIDS called protease inhibitors like lopinavir; ritonavir, ritonavir, and saquinavir °-certain quinolone antibiotics °-cimetidine °-cisapride °-conivaptan °-cyclobenzaprine °-droperidol °-grapefruit juice °-haloperidol °-hawthorn °-imatinib °-levomethadyl °-medicines for depression, anxiety, or psychotic disturbances °-medicines for malaria like chloroquine and halofantrine °-megestrol °-memantine °-metformin °-methadone °-mibefradil °-mifepristone °-other medicines to control heart rhythm °-pentamidine °-phenothiazines like chlorpromazine, mesoridazine, prochlorperazine, thioridazine °-ranolazine °-sertindole °-telithromycin °-thiazide diuretics °-trimethoprim °-trospium °-vardenafil °-verapamil °-ziprasidone °This medicine may also interact with the following medications: °-antidepressants called SSRIs °-certain medicines for fungal infections like fluconazole or voriconazole °-certain heart medicines like digoxin or diltiazem °-diuretics °-dronabinol, THC °-norfloxacin °-other medicines for HIV, AIDS called protease inhibitors °-quinine °-zafirlukast °This list may not describe all possible interactions. Give your health care provider a list of all the medicines, herbs, non-prescription drugs, or dietary supplements you use. Also tell them if you smoke, drink alcohol, or use illegal drugs. Some items may interact with your medicine. °What should I watch for while using this medicine? °Visit your doctor or health care professional for regular checks on your progress. Wear a medical ID bracelet or chain, and carry a card that describes your disease and details of your medicine and dosage times. °Check your heart rate and blood pressure regularly while you are taking this medicine. Ask your doctor or health care professional what your heart rate and blood pressure should be, and when you should  contact him or her. Your doctor or health care professional also may schedule regular tests to check your progress. °You will be started   on this medicine in a specialized facility for at least three days. You will be monitored to find the right dose of medicine for you. It is very important that you take your medicine exactly as prescribed when you leave the hospital. The correct dosing of this medicine is very important to treat your condition and prevent possible serious side effects. °What side effects may I notice from receiving this medicine? °Side effects that you should report to your doctor or health care professional as soon as possible: °-allergic reactions like skin rash, itching or hives, swelling of the face, lips, or tongue °-breathing problems °-dizziness °-fast or rapid beating of the heart °-feeling faint or lightheaded °-swelling of the ankles °-unusually weak or tired °-vomiting °Side effects that usually do not require medical attention (report to your doctor or health care professional if they continue or are bothersome): °-cough °-diarrhea °-difficulty sleeping °-headache °-nausea °-stomach pain °This list may not describe all possible side effects. Call your doctor for medical advice about side effects. You may report side effects to FDA at 1-800-FDA-1088. °Where should I keep my medicine? °Keep out of the reach of children. °Store at room temperature between 15 and 30 degrees C (59 and 86 degrees F). Protect the medicine from moisture or humidity. Keep container tightly closed. Throw away any unused medicine after the expiration date. °NOTE: This sheet is a summary. It may not cover all possible information. If you have questions about this medicine, talk to your doctor, pharmacist, or health care provider. °© 2015, [employer]. (2013-05-29 16:40:57) ° °

## 2015-03-20 DIAGNOSIS — I10 Essential (primary) hypertension: Secondary | ICD-10-CM

## 2015-03-20 LAB — BASIC METABOLIC PANEL
Anion gap: 8 (ref 5–15)
BUN: 13 mg/dL (ref 6–23)
CHLORIDE: 107 mmol/L (ref 96–112)
CO2: 24 mmol/L (ref 19–32)
CREATININE: 1.24 mg/dL (ref 0.50–1.35)
Calcium: 9.3 mg/dL (ref 8.4–10.5)
GFR calc Af Amer: 65 mL/min — ABNORMAL LOW (ref >90)
GFR calc non Af Amer: 56 mL/min — ABNORMAL LOW (ref >90)
Glucose, Bld: 108 mg/dL — ABNORMAL HIGH (ref 70–99)
Potassium: 4.1 mmol/L (ref 3.5–5.1)
Sodium: 139 mmol/L (ref 135–145)

## 2015-03-20 LAB — MAGNESIUM: MAGNESIUM: 2 mg/dL (ref 1.5–2.5)

## 2015-03-20 MED ORDER — CARVEDILOL 6.25 MG PO TABS: 6.2500 mg | ORAL_TABLET | Freq: Two times a day (BID) | ORAL | Status: DC

## 2015-03-20 MED ORDER — DOFETILIDE 250 MCG PO CAPS: 250.0000 ug | ORAL_CAPSULE | Freq: Two times a day (BID) | ORAL | Status: DC

## 2015-03-20 NOTE — Progress Notes (Signed)
Patient Profile: 74 y/o male with a history of recurrent symptomatic atrial fibrillation admitted for Tikosyn loading  Subjective: No complaints. Denies CP, dyspnea and palpitations.   Objective: Vital signs in last 24 hours: Temp:  [97.8 F (36.6 C)-98.3 F (36.8 C)] 98 F (36.7 C) (04/08 0537) Pulse Rate:  [63-85] 70 (04/08 0537) Resp:  [18] 18 (04/08 0537) BP: (125-129)/(69-76) 129/76 mmHg (04/08 0537) SpO2:  [96 %-99 %] 99 % (04/08 0537) Weight:  [205 lb 0.4 oz (93 kg)] 205 lb 0.4 oz (93 kg) (04/08 0500) Last BM Date: 03/15/15  Intake/Output from previous day: 04/07 0701 - 04/08 0700 In: 480 [P.O.:480] Out: -  Intake/Output this shift:    Medications Current Facility-Administered Medications  Medication Dose Route Frequency Provider Last Rate Last Dose  . 0.9 %  sodium chloride infusion  250 mL Intravenous PRN [name], MD      . acetaminophen (TYLENOL) tablet 650 mg  650 mg Oral Q4H PRN Isaiah Serge, NP      . apixaban Arne Cleveland) tablet 5 mg  5 mg Oral Q12H Isaiah Serge, NP   5 mg at 03/19/15 2009  . carvedilol (COREG) tablet 6.25 mg  6.25 mg Oral BID Isaiah Serge, NP   6.25 mg at 03/19/15 2202  . clopidogrel (PLAVIX) tablet 75 mg  75 mg Oral Q breakfast Isaiah Serge, NP   75 mg at 03/18/15 1884  . dofetilide (TIKOSYN) capsule 250 mcg  250 mcg Oral BID [name], MD   250 mcg at 03/19/15 2009  . famotidine (PEPCID) tablet 40 mg  40 mg Oral BID Isaiah Serge, NP   40 mg at 03/19/15 2202  . furosemide (LASIX) tablet 20 mg  20 mg Oral Daily Isaiah Serge, NP   20 mg at 03/19/15 1215  . irbesartan (AVAPRO) tablet 300 mg  300 mg Oral Daily Isaiah Serge, NP   300 mg at 03/19/15 1215  . isosorbide mononitrate (IMDUR) 24 hr tablet 60 mg  60 mg Oral Daily Isaiah Serge, NP   60 mg at 03/19/15 1216  . multivitamin with minerals tablet 1 tablet  1 tablet Oral Daily Skeet Simmer, RPH   1 tablet at 03/19/15 1216  . nicotine (NICODERM CQ - dosed in mg/24  hours) patch 14 mg  14 mg Transdermal Daily Isaiah Serge, NP   14 mg at 03/19/15 1213  . nitroGLYCERIN (NITROSTAT) SL tablet 0.4 mg  0.4 mg Sublingual Q5 min PRN Isaiah Serge, NP      . omega-3 acid ethyl esters (LOVAZA) capsule 1 g  1 g Oral Daily Isaiah Serge, NP   1 g at 03/19/15 1213  . ondansetron (ZOFRAN) injection 4 mg  4 mg Intravenous Q6H PRN Isaiah Serge, NP      . potassium chloride (K-DUR) CR tablet 10 mEq  10 mEq Oral Daily Isaiah Serge, NP   10 mEq at 03/19/15 1216  . rosuvastatin (CRESTOR) tablet 40 mg  40 mg Oral q1800 Isaiah Serge, NP   40 mg at 03/19/15 1800  . sodium chloride 0.9 % injection 3 mL  3 mL Intravenous Q12H [name], MD   3 mL at 03/19/15 2202  . sodium chloride 0.9 % injection 3 mL  3 mL Intravenous PRN [name], MD        PE: General appearance: alert, cooperative and no distress Neck: no carotid bruit and no  JVD Lungs: clear to auscultation bilaterally Heart: regular rate and rhythm, S1, S2 normal, no murmur, click, rub or gallop Extremities: no LEE Pulses: 2+ and symmetric Skin: warm and dry Neurologic: Grossly normal  Lab Results:  No results for input(s): WBC, HGB, HCT, PLT in the last 72 hours. BMET  Recent Labs  03/18/15 0551 03/19/15 0518 03/20/15 0333  NA 138 138 139  K 4.3 4.2 4.1  CL 108 109 107  CO2 23 19 24   GLUCOSE 109* 103* 108*  BUN 13 14 13   CREATININE 1.28 1.11 1.24  CALCIUM 9.2 9.3 9.3    Assessment/Plan  Principal Problem:   DOE (dyspnea on exertion) Active Problems:   S/P abdominal aortic aneurysm repair   Accelerated hypertension, hx of   CAD (coronary artery disease) stent place 03/04/15   HLD (hyperlipidemia)   History of TIA (transient ischemic attack), 02/20/15   Atrial fibrillation with RVR   1. Atrial Fibrillation: Maintaining NSR on Tikosyn. He is to receive his 6th dose this am. K is 4.1 and Mg 2.0. QT intervals have remained stable thus far. Will check EKG after 6th dose. If  stable, will d/c home on 250 mcg BID. Continue Eliquis for stroke prophylaxis.   2. CAD: s/p PTCA/BMS to a subtotal ramus intermediate vessel 03/04/15. Stable w/o CP. Continue medical therapy.   3. HTN: BP well controlled.   4. HLD: Continue statin therapy.     LOS: 4 days    Romanda Turrubiates M. Rosita Fire, PA-C 03/20/2015 8:00 AM

## 2015-03-20 NOTE — Discharge Summary (Signed)
Physician Discharge Summary  Patient ID: Rodney [name] MRN: [id number] DOB/AGE: February 15, 1941 74 y.o.   Primary Cardiologist: Dr. Debara Pickett  Admit date: 03/16/2015 Discharge date: 03/20/2015  Admission Diagnoses: Atrial Fibrillation   Discharge Diagnoses:  Principal Problem:   DOE (dyspnea on exertion) Active Problems:   S/P abdominal aortic aneurysm repair   Accelerated hypertension, hx of   CAD (coronary artery disease) stent place 03/04/15   HLD (hyperlipidemia)   History of TIA (transient ischemic attack), 02/20/15   Atrial fibrillation with RVR   Discharged Condition: stable  Hospital Course: 74 y/o AAM followed by Dr. Debara Pickett with a h/o CAD s/p recent PTCA/BM stenting of a subtotal ramus intermediate vessel, PAF, TIA, chronic anticoagulation with Xarelto and h/o AAA repair. He presented to [facility] on 03/16/15 with SOB, in the setting of rapid atrial fibrillation. Given his history of recurrent symptomatic atrial fibrillation, the decision was made to initiate antiarrythmic therapy with Tikosyn. He was initially started on 500 mcg BID but EKG following initial dose demonstrated at QTc of 506 ms. His dose was reduced to 250 mcg BID. QTc was monitored and was less than 500 ms and remained stable throughout the remainder of his 6 dose initiation. Mg and K also remained stable. He converted to NSR. There were no arrhthymias on telemetry. He was continued on Xarelto throughout. He was last seen and examined by Dr. Debara Pickett who determined he was stable for discharge home. He will follow-up in 1 week with our office pharmacist for repeat EKG and labs to reassess electrolytes. He will continue routine follow-up with Dr. Debara Pickett.   Consults: None  Significant Diagnostic Studies:   Treatments: See Hospital Course  Discharge Exam: Blood pressure 129/76, pulse 70, temperature 98 F (36.7 C), temperature source Oral, resp. rate 18, height 6\' 2"  (1.88 m), weight 205 lb 0.4 oz (93 kg), SpO2 99 %.   Disposition:  01-Home or Self Care      Discharge Instructions    Diet - low sodium heart healthy    Complete by:  As directed      Increase activity slowly    Complete by:  As directed             Medication List    STOP taking these medications        aspirin 81 MG tablet      TAKE these medications        apixaban 5 MG Tabs tablet  Commonly known as:  ELIQUIS  Take 1 tablet (5 mg total) by mouth 2 (two) times daily.     carvedilol 6.25 MG tablet  Commonly known as:  COREG  Take 1 tablet (6.25 mg total) by mouth 2 (two) times daily with a meal.     clopidogrel 75 MG tablet  Commonly known as:  PLAVIX  Take 1 tablet (75 mg total) by mouth daily with breakfast.     CRESTOR 40 MG tablet  Generic drug:  rosuvastatin  Take 40 mg by mouth daily.     dofetilide 250 MCG capsule  Commonly known as:  TIKOSYN  Take 1 capsule (250 mcg total) by mouth 2 (two) times daily.     dofetilide 250 MCG capsule  Commonly known as:  TIKOSYN  Take 1 capsule (250 mcg total) by mouth 2 (two) times daily.     famotidine 40 MG tablet  Commonly known as:  PEPCID  Take 1 tablet (40 mg total) by mouth 2 (two) times daily.  fish oil-omega-3 fatty acids 1000 MG capsule  Take 1 g by mouth daily.     furosemide 20 MG tablet  Commonly known as:  LASIX  Take 1 tablet (20 mg total) by mouth daily.     irbesartan 300 MG tablet  Commonly known as:  AVAPRO  Take 1 tablet (300 mg total) by mouth daily.     isosorbide mononitrate 60 MG 24 hr tablet  Commonly known as:  IMDUR  Take 1 tablet (60 mg total) by mouth daily.     multivitamin capsule  Take 1 capsule by mouth daily.     nicotine 14 mg/24hr patch  Commonly known as:  NICODERM CQ - dosed in mg/24 hours  Place 1 patch (14 mg total) onto the skin daily.     nitroGLYCERIN 0.4 MG SL tablet  Commonly known as:  NITROSTAT  Place 1 tablet (0.4 mg total) under the tongue every 5 (five) minutes as needed for chest pain (CP or SOB).      potassium chloride 10 MEQ tablet  Commonly known as:  K-DUR  Take 1 tablet (10 mEq total) by mouth daily.       Follow-up Information    Follow up with [facility] On 03/27/2015.   Why:  11:00 AM (for Tikosyn check with Elberta Leatherwood, Pharmacist )   Contact information:   [address] [place] [address]     TIME SPENT ON DISCHARGE, INCLUDING PHYSICIAN TIME: >30 MINTUES  Signed: Lyda Jester 03/20/2015, 3:13 PM

## 2015-03-24 ENCOUNTER — Encounter (HOSPITAL_COMMUNITY): Payer: Self-pay | Admitting: Emergency Medicine

## 2015-03-24 ENCOUNTER — Emergency Department (HOSPITAL_COMMUNITY)
Admission: EM | Admit: 2015-03-24 | Discharge: 2015-03-24 | Disposition: A | Discharge status: Home / Self Care | Payer: Medicare Other | Attending: Emergency Medicine | Admitting: Emergency Medicine

## 2015-03-24 DIAGNOSIS — Z720 Tobacco use: Secondary | ICD-10-CM | POA: Insufficient documentation

## 2015-03-24 DIAGNOSIS — Z8739 Personal history of other diseases of the musculoskeletal system and connective tissue: Secondary | ICD-10-CM | POA: Insufficient documentation

## 2015-03-24 DIAGNOSIS — I2510 Atherosclerotic heart disease of native coronary artery without angina pectoris: Secondary | ICD-10-CM | POA: Diagnosis not present

## 2015-03-24 DIAGNOSIS — E785 Hyperlipidemia, unspecified: Secondary | ICD-10-CM | POA: Diagnosis not present

## 2015-03-24 DIAGNOSIS — Z79899 Other long term (current) drug therapy: Secondary | ICD-10-CM | POA: Diagnosis not present

## 2015-03-24 DIAGNOSIS — I480 Paroxysmal atrial fibrillation: Secondary | ICD-10-CM | POA: Insufficient documentation

## 2015-03-24 DIAGNOSIS — Z7902 Long term (current) use of antithrombotics/antiplatelets: Secondary | ICD-10-CM | POA: Diagnosis not present

## 2015-03-24 DIAGNOSIS — I10 Essential (primary) hypertension: Secondary | ICD-10-CM | POA: Diagnosis present

## 2015-03-24 NOTE — ED Notes (Addendum)
The patient just had stents placed on the 23rd of March.  He was placed on several medications.  He says they took his pressure at [employer] [facility] [employer] and it was elevated.   The patient denies any pain or any other symptoms.

## 2015-03-24 NOTE — Discharge Instructions (Signed)
Please call your doctor for a followup appointment within 24-48 hours. When you talk to your doctor please let them know that you were seen in the emergency department and have them acquire all of your records so that they can discuss the findings with you and formulate a treatment plan to fully care for your new and ongoing problems. ° °

## 2015-03-24 NOTE — ED Provider Notes (Signed)
CSN: [id number]     Arrival date & time 03/24/15  0034 History  This chart was scribed for Rodney Chapel, MD by Rayfield Citizen, ED Scribe. This patient was seen in room A03C/A03C and the patient's care was started at 2:29 AM.    Chief Complaint  Patient presents with  . Hypertension    The patient just had stents placed on the 23rd of March.  He was placed on several medications.  He says they took his pressure at [employer] and it was elevated.    Patient is a 74 y.o. male presenting with hypertension. The history is provided by the patient and the spouse. No language interpreter was used.  Hypertension    HPI Comments: Rodney [name] is a 74 y.o. male with past medical history of HTN, HLD, CAD, AAA (stent graft put in 02/2009) who presents to the Emergency Department for hypertension. He had two stents placed on 03/04/15 and was placed on several medications at that time; he states he has been compliant with all medications since that time. He notes a recent change from carvedilol to Tikosyn, as well as some recent dosage changes. Patient reports that he felt some transient nausea and fatigue today but has otherwise felt well; he noted a BP of 199/99 when measuring in the local grocery store, prompting him to come to the ED tonight. His BP has improved since that time.   Past Medical History  Diagnosis Date  . Hypertension   . Hyperlipidemia   . Gout   . CAD (coronary artery disease)     a. cath 2010 b. CAD s/p PCI to ramus and LCX on 03/04/15  . AAA (abdominal aortic aneurysm)     stent graft put in 02/2009  . Arthritis   . Paroxysmal atrial fibrillation   . Tobacco abuse    Past Surgical History  Procedure Laterality Date  . Abdominal aortic aneurysm repair  02/19/2009    performed by [facility]  . Colonoscopy    . Knee arthroscopy  2003    left  . Shoulder arthroscopy with rotator cuff repair and subacromial decompression Left 11/19/2013    Procedure: LEFT SHOULDER ARTHROSCOPY DEBRIDEMENT  EXTENTSIVE DISTAL CLAVICULECTOMY DECOMPRESSION PARTIAL ACROMIOPLASTY WITH CORACOACROMIAL WITH ROTATOR CUFF REPAIR ;  Surgeon: Renette Butters, MD;  Location: [place];  Service: Orthopedics;  Laterality: Left;  . Left and right heart catheterization with coronary angiogram N/A 03/03/2015    Procedure: LEFT AND RIGHT HEART CATHETERIZATION WITH CORONARY ANGIOGRAM;  Surgeon: Troy Sine, MD;    . Percutaneous coronary stent intervention (pci-s) N/A 03/04/2015    Procedure: PERCUTANEOUS CORONARY STENT INTERVENTION (PCI-S);  Surgeon: Troy Sine, MD; RI 99>>0% w/   BMS, bifurcation CFX-OM 90>>40% w/ Angiosculpt PTCA; dCFX 90>>0% w/   2.7520 mm Rebel BMS   Family History  Problem Relation Age of Onset  . Diabetes Sister   . Hypertension Sister   . Cancer Father     prostate  . Hypertension Mother   . Stroke Neg Hx   . Heart attack Neg Hx    History  Substance Use Topics  . Smoking status: Current Every Day Smoker -- 0.50 packs/day for 50 years    Types: Cigarettes  . Smokeless tobacco: Never Used  . Alcohol Use: 4.2 - 4.8 oz/week    6 Cans of beer, 1-2 Shots of liquor per week     Comment: couple shots of royal per day    Review of  Systems  Constitutional: Positive for fatigue.  Gastrointestinal: Positive for nausea.  All other systems reviewed and are negative.   Allergies  Iohexol and Lisinopril  Home Medications   Prior to Admission medications   Medication Sig Start Date End Date Taking? Authorizing Provider  apixaban (ELIQUIS) 5 MG TABS tablet Take 1 tablet (5 mg total) by mouth 2 (two) times daily. 01/29/15  Yes [name], MD  carvedilol (COREG) 6.25 MG tablet Take 1 tablet (6.25 mg total) by mouth 2 (two) times daily with a meal. 03/20/15  Yes Brittainy Erie Noe, PA-C  clopidogrel (PLAVIX) 75 MG tablet Take 1 tablet (75 mg total) by mouth daily with breakfast. 03/06/15  Yes Rhonda G Barrett, PA-C  CRESTOR 40 MG tablet Take 40 mg by mouth daily.  11/05/14  Yes Historical Provider, MD  dofetilide (TIKOSYN) 250 MCG capsule Take 1 capsule (250 mcg total) by mouth 2 (two) times daily. 03/20/15  Yes Brittainy Erie Noe, PA-C  famotidine (PEPCID) 40 MG tablet Take 1 tablet (40 mg total) by mouth 2 (two) times daily. 03/06/15  Yes Rhonda G Barrett, PA-C  fish oil-omega-3 fatty acids 1000 MG capsule Take 1 g by mouth daily.    Yes Historical Provider, MD  furosemide (LASIX) 20 MG tablet Take 1 tablet (20 mg total) by mouth daily. 01/05/15  Yes Robbie Lis, MD  irbesartan (AVAPRO) 300 MG tablet Take 1 tablet (300 mg total) by mouth daily. 03/09/15  Yes Brett Canales, PA-C  isosorbide mononitrate (IMDUR) 60 MG 24 hr tablet Take 1 tablet (60 mg total) by mouth daily. 03/06/15  Yes Rhonda G Barrett, PA-C  Multiple Vitamin (MULTIVITAMIN WITH MINERALS) TABS tablet Take 1 tablet by mouth daily.   Yes Historical Provider, MD  nicotine (NICODERM CQ - DOSED IN MG/24 HOURS) 14 mg/24hr patch Place 1 patch (14 mg total) onto the skin daily. 02/21/15  Yes Barton Dubois, MD  nitroGLYCERIN (NITROSTAT) 0.4 MG SL tablet Place 1 tablet (0.4 mg total) under the tongue every 5 (five) minutes as needed for chest pain (CP or SOB). 03/09/15  Yes Brett Canales, PA-C  potassium chloride (K-DUR) 10 MEQ tablet Take 1 tablet (10 mEq total) by mouth daily. 01/05/15  Yes Robbie Lis, MD  dofetilide (TIKOSYN) 250 MCG capsule Take 1 capsule (250 mcg total) by mouth 2 (two) times daily. Patient not taking: Reported on 03/24/2015 03/20/15   Brittainy M Simmons, PA-C   BP 168/79 mmHg  Pulse 51  Temp(Src) 98.3 F (36.8 C) (Oral)  Resp 16  SpO2 99% Physical Exam  Constitutional: He appears well-developed and well-nourished.  HENT:  Head: Normocephalic and atraumatic.  Eyes: Conjunctivae are normal. Right eye exhibits no discharge. Left eye exhibits no discharge.  Pulmonary/Chest: Effort normal. No respiratory distress.  Neurological: He is alert. Coordination normal.  Skin: Skin is  warm and dry. No rash noted. He is not diaphoretic. No erythema.  Psychiatric: He has a normal mood and affect.  Nursing note and vitals reviewed.   ED Course  Procedures   DIAGNOSTIC STUDIES: Oxygen Saturation is 100% on RA, normal by my interpretation.    COORDINATION OF CARE: 2:38 AM Discussed treatment plan with pt at bedside and pt agreed to plan.   Labs Review Labs Reviewed - No data to display  Imaging Review No results found.    MDM   Final diagnoses:  Essential hypertension    No symptoms, normal heart rate, blood pressure improving slowly, he has recently  started new medications, I do not see a need to add additional medications or changed dosages at this time, stable for discharge to follow-up in the office. Again the patient is having no chest pain no shortness of breath, no weakness, no nausea, no diaphoresis.,    I personally performed the services described in this documentation, which was scribed in my presence. The recorded information has been reviewed and is accurate.       Rodney Chapel, MD 03/24/15 [phone]

## 2015-03-24 NOTE — ED Notes (Signed)
Pt c/o hypertension. Recently discharged on Friday after having two stents placed. Pt reports change in medications at that time. Pt checked blood pressure at [place] and realized bp was elevated, 924Q systolically. Reports taking medications "like clock work."

## 2015-03-27 ENCOUNTER — Ambulatory Visit (INDEPENDENT_AMBULATORY_CARE_PROVIDER_SITE_OTHER): Payer: Medicare Other | Admitting: Pharmacist

## 2015-03-27 VITALS — BP 136/70 | HR 68

## 2015-03-27 DIAGNOSIS — I4891 Unspecified atrial fibrillation: Secondary | ICD-10-CM

## 2015-03-27 LAB — BASIC METABOLIC PANEL
BUN: 15 mg/dL (ref 6–23)
CALCIUM: 9.5 mg/dL (ref 8.4–10.5)
CO2: 27 meq/L (ref 19–32)
CREATININE: 1.26 mg/dL (ref 0.40–1.50)
Chloride: 107 mEq/L (ref 96–112)
GFR: 72.18 mL/min (ref >60.00)
GLUCOSE: 101 mg/dL — AB (ref 70–99)
Potassium: 4.1 mEq/L (ref 3.5–5.1)
Sodium: 137 mEq/L (ref 135–145)

## 2015-03-27 LAB — MAGNESIUM: Magnesium: 2.1 mg/dL (ref 1.5–2.5)

## 2015-03-27 NOTE — Patient Instructions (Signed)
Your physician wants you to follow-up in: 1 month with Dr. Debara Pickett at the [facility].

## 2015-03-27 NOTE — Progress Notes (Signed)
HPI  Rodney [name] is a 74 y/o AAM followed by Dr. Debara Pickett with a h/o CAD s/p recent PTCA/BM stenting of a subtotal ramus intermediate vessel, PAF, TIA, chronic anticoagulation with Xarelto and h/o AAA repair. He presented to [facility] on 03/16/15 with SOB, in the setting of rapid atrial fibrillation. Given his history of recurrent symptomatic atrial fibrillation, the decision was made to initiate antiarrythmic therapy with Tikosyn. He was initially started on 500 mcg BID but EKG following initial dose demonstrated at QTc of 506 ms. His dose was reduced to 250 mcg BID. QTc was monitored and was less than 500 ms and remained stable throughout the remainder of his 6 dose initiation. At discharge his QTc was 475 msec. Mg and K also remained stable. He converted to NSR. There were no arrhthymias on telemetry. He was continued on Xarelto throughout.    On 4/12, he had an elevated BP at the local pharmacy (199/99) so he went to the ER.  BP in the ER was 168/79.  Pt was asymptomatic so no changes were made at that time.   Pt states he is feeling well today.  He has had a decrease in his SOB and some increase in energy.  He did not have any problems getting Tikosyn from the pharmacy but did have a $100 copay.  His only oths been coner expensive medication is Crestor and it goes generic next month so this will help him financially.  He has not missed any doses since discharge and was re-educated on the importance of compliance.  His BP was controlled today in the office and has been controlled over the past few days at home.   EKG reviewed by Dr. Radford Pax.  NSR with vent rate of 62 bpm. QTc 450 msec.    Current Outpatient Prescriptions  Medication Sig Dispense Refill  . apixaban (ELIQUIS) 5 MG TABS tablet Take 1 tablet (5 mg total) by mouth 2 (two) times daily. 60 tablet 6  . carvedilol (COREG) 6.25 MG tablet Take 1 tablet (6.25 mg total) by mouth 2 (two) times daily with a meal. 60 tablet 5  . clopidogrel (PLAVIX) 75 MG  tablet Take 1 tablet (75 mg total) by mouth daily with breakfast. 30 tablet 0  . CRESTOR 40 MG tablet Take 40 mg by mouth daily.  4  . dofetilide (TIKOSYN) 250 MCG capsule Take 1 capsule (250 mcg total) by mouth 2 (two) times daily. 60 capsule 5  . dofetilide (TIKOSYN) 250 MCG capsule Take 1 capsule (250 mcg total) by mouth 2 (two) times daily. (Patient not taking: Reported on 03/24/2015) 14 capsule 0  . famotidine (PEPCID) 40 MG tablet Take 1 tablet (40 mg total) by mouth 2 (two) times daily. 30 tablet 3  . fish oil-omega-3 fatty acids 1000 MG capsule Take 1 g by mouth daily.     . furosemide (LASIX) 20 MG tablet Take 1 tablet (20 mg total) by mouth daily. 30 tablet 0  . irbesartan (AVAPRO) 300 MG tablet Take 1 tablet (300 mg total) by mouth daily. 30 tablet 11  . isosorbide mononitrate (IMDUR) 60 MG 24 hr tablet Take 1 tablet (60 mg total) by mouth daily. 30 tablet 11  . Multiple Vitamin (MULTIVITAMIN WITH MINERALS) TABS tablet Take 1 tablet by mouth daily.    . nicotine (NICODERM CQ - DOSED IN MG/24 HOURS) 14 mg/24hr patch Place 1 patch (14 mg total) onto the skin daily. 28 patch 0  . nitroGLYCERIN (NITROSTAT) 0.4 MG  SL tablet Place 1 tablet (0.4 mg total) under the tongue every 5 (five) minutes as needed for chest pain (CP or SOB). 25 tablet 12  . potassium chloride (K-DUR) 10 MEQ tablet Take 1 tablet (10 mEq total) by mouth daily. 30 tablet 0   No current facility-administered medications for this visit.   Allergies  Allergen Reactions  . Iohexol      Code: RASH, Desc: PAITENT STATED THAT HE BEGAN ITCHING TOWARDS END OF INJECTIN OF IV CONTRAST-- 13 HR PREP RECOMMENDED/MMS   . Lisinopril Cough    Assessment and Plan 1.  Atrial Fibrillation- Pt doing well since starting Tikosyn.  He states he has more energy and less SOB.  He is ready to start golfing again.  QTc stable.  Labs pending.  Will continue 210mcg BID.  Will have patient schedule a follow up visit with Dr. Debara Pickett in 1 month.

## 2015-04-02 ENCOUNTER — Telehealth: Payer: Self-pay | Admitting: Internal Medicine

## 2015-04-02 NOTE — Telephone Encounter (Signed)
Patient is returning a call regarding his lab results.

## 2015-04-02 NOTE — Telephone Encounter (Signed)
Returned call to patient lab results given. 

## 2015-04-21 ENCOUNTER — Ambulatory Visit (INDEPENDENT_AMBULATORY_CARE_PROVIDER_SITE_OTHER): Payer: Medicare Other | Admitting: *Deleted

## 2015-04-21 ENCOUNTER — Encounter: Payer: Self-pay | Admitting: *Deleted

## 2015-04-21 VITALS — BP 148/86 | HR 68 | Ht 74.0 in | Wt 222.3 lb

## 2015-04-21 DIAGNOSIS — R0600 Dyspnea, unspecified: Secondary | ICD-10-CM

## 2015-04-21 MED ORDER — FAMOTIDINE 40 MG PO TABS: 40.0000 mg | ORAL_TABLET | Freq: Two times a day (BID) | ORAL | Status: DC

## 2015-04-21 NOTE — Progress Notes (Signed)
I agree .Marland Kitchen No changes in his meds at this time. If he gets worse, I can try to see him sooner than 5/23.  Dr. Lemmie Evens

## 2015-04-21 NOTE — Progress Notes (Signed)
Patient walked in with complaints of shortness of breath and left leg swelling.   Patient reports for last 2 days he felt short of breath when walking only after taking his AM medications. At time of walk in, he was no longer short of breath. He says it felt like he could not take deep breaths. Lung sounds auscultated and were clear bilaterally. No audible or visual respiratory distress. He reports his weights at home have been 215lbs on Sunday and 217lbs yesterday. He did not weigh at home this AM. He was advised to monitor for worsening SOB and monitor weights daily and call if he gained 3 lbs in 1 day or 5 lbs in 1 week.   Patient reports left leg swelling. At time of walk in, no noticeable edema in left leg - no pitting. Left ankle slightly larger than right. No pain, tenderness, redness, temperature change of this lower extremity.  Patient reports he was in [place] this weekend and played 3 rounds of golf with no symptoms.   He reports he has stopped smoking and wife states he has replaced nicotine with food.  He was concerned that OTC Claritin that he has been taking for a few days for allergies may have caused some issues with his other medications.   He is taking all medications as prescribed - he requests refill of Pepcid for 90 day supply.   Informed patient that Pepcid would be refilled and Dr. Debara Pickett would be notified of walk-in complaints for further advice.   Patient is scheduled for follow up on 05/04/2015 with Dr. Debara Pickett

## 2015-04-28 ENCOUNTER — Encounter (HOSPITAL_COMMUNITY): Payer: Self-pay | Admitting: *Deleted

## 2015-04-28 ENCOUNTER — Emergency Department (HOSPITAL_COMMUNITY)
Admission: EM | Admit: 2015-04-28 | Discharge: 2015-04-28 | Disposition: A | Discharge status: Home / Self Care | Payer: Medicare Other | Attending: Emergency Medicine | Admitting: Emergency Medicine

## 2015-04-28 DIAGNOSIS — Z87891 Personal history of nicotine dependence: Secondary | ICD-10-CM | POA: Diagnosis not present

## 2015-04-28 DIAGNOSIS — Z7902 Long term (current) use of antithrombotics/antiplatelets: Secondary | ICD-10-CM | POA: Diagnosis not present

## 2015-04-28 DIAGNOSIS — I2510 Atherosclerotic heart disease of native coronary artery without angina pectoris: Secondary | ICD-10-CM | POA: Insufficient documentation

## 2015-04-28 DIAGNOSIS — I493 Ventricular premature depolarization: Secondary | ICD-10-CM | POA: Insufficient documentation

## 2015-04-28 DIAGNOSIS — I480 Paroxysmal atrial fibrillation: Secondary | ICD-10-CM | POA: Diagnosis not present

## 2015-04-28 DIAGNOSIS — R008 Other abnormalities of heart beat: Secondary | ICD-10-CM | POA: Diagnosis present

## 2015-04-28 DIAGNOSIS — Z8739 Personal history of other diseases of the musculoskeletal system and connective tissue: Secondary | ICD-10-CM | POA: Insufficient documentation

## 2015-04-28 DIAGNOSIS — Z79899 Other long term (current) drug therapy: Secondary | ICD-10-CM | POA: Insufficient documentation

## 2015-04-28 DIAGNOSIS — E785 Hyperlipidemia, unspecified: Secondary | ICD-10-CM | POA: Insufficient documentation

## 2015-04-28 DIAGNOSIS — I10 Essential (primary) hypertension: Secondary | ICD-10-CM | POA: Insufficient documentation

## 2015-04-28 LAB — CBC WITH DIFFERENTIAL/PLATELET
BASOS PCT: 1 % (ref 0–1)
Basophils Absolute: 0 10*3/uL (ref 0.0–0.1)
EOS ABS: 0.1 10*3/uL (ref 0.0–0.7)
Eosinophils Relative: 2 % (ref 0–5)
HEMATOCRIT: 39.6 % (ref 39.0–52.0)
HEMOGLOBIN: 13.6 g/dL (ref 13.0–17.0)
Lymphocytes Relative: 42 % (ref 12–46)
Lymphs Abs: 1.2 10*3/uL (ref 0.7–4.0)
MCH: 31.1 pg (ref 26.0–34.0)
MCHC: 34.3 g/dL (ref 30.0–36.0)
MCV: 90.6 fL (ref 78.0–100.0)
MONOS PCT: 10 % (ref 3–12)
Monocytes Absolute: 0.3 10*3/uL (ref 0.1–1.0)
Neutro Abs: 1.3 10*3/uL — ABNORMAL LOW (ref 1.7–7.7)
Neutrophils Relative %: 45 % (ref 43–77)
Platelets: 112 10*3/uL — ABNORMAL LOW (ref 150–400)
RBC: 4.37 MIL/uL (ref 4.22–5.81)
RDW: 14.1 % (ref 11.5–15.5)
WBC: 2.9 10*3/uL — ABNORMAL LOW (ref 4.0–10.5)

## 2015-04-28 LAB — BASIC METABOLIC PANEL
ANION GAP: 11 (ref 5–15)
BUN: 16 mg/dL (ref 6–20)
CALCIUM: 8.7 mg/dL — AB (ref 8.9–10.3)
CO2: 22 mmol/L (ref 22–32)
CREATININE: 1.24 mg/dL (ref 0.61–1.24)
Chloride: 108 mmol/L (ref 101–111)
GFR calc non Af Amer: 56 mL/min — ABNORMAL LOW (ref >60)
Glucose, Bld: 105 mg/dL — ABNORMAL HIGH (ref 65–99)
POTASSIUM: 3.7 mmol/L (ref 3.5–5.1)
Sodium: 141 mmol/L (ref 135–145)

## 2015-04-28 NOTE — Discharge Instructions (Signed)
Premature Ventricular Contraction Premature ventricular contraction (PVC) is an irregularity of the heart rhythm involving extra or skipped heartbeats. In some cases, they may occur without obvious cause or heart disease. Other times, they can be caused by an electrolyte change in the blood. These need to be corrected. They can also be seen when there is not enough oxygen going to the heart. A common cause of this is plaque or cholesterol buildup. This buildup decreases the blood supply to the heart. In addition, extra beats may be caused or aggravated by:  Excessive smoking.  Alcohol consumption.  Caffeine.  Certain medications  Some street drugs. SYMPTOMS   The sensation of feeling your heart skipping a beat (palpitations).  In many cases, the person may have no symptoms. SIGNS AND TESTS   A physical examination may show an occasional irregularity, but if the PVC beats do not happen often, they may not be found on physical exam.  Blood pressure is usually normal.  Other tests that may find extra beats of the heart are:  An EKG (electrocardiogram)  A Holter monitor which can monitor your heart over longer periods of time  An Angiogram (study of the heart arteries). TREATMENT  Usually extra heartbeats do not need treatment. The condition is treated only if symptoms are severe or if extra beats are very frequent or are causing problems. An underlying cause, if discovered, may also require treatment.  Treatment may also be needed if there may be a risk for other more serious cardiac arrhythmias.  PREVENTION   Moderation in caffeine, alcohol, and tobacco use may reduce the risk of ectopic heartbeats in some people.  Exercise often helps people who lead a sedentary (inactive) lifestyle. PROGNOSIS  PVC heartbeats are generally harmless and do not need treatment.  RISKS AND COMPLICATIONS   Ventricular tachycardia (occasionally).  There usually are no complications.  Other  arrhythmias (occasionally). SEEK IMMEDIATE MEDICAL CARE IF:   You feel palpitations that are frequent or continual.  You develop chest pain or other problems such as shortness of breath, sweating, or nausea and vomiting.  You become light-headed or faint (pass out).  You get worse or do not improve with treatment. Document Released: 07/15/2004 Document Revised: 02/20/2012 Document Reviewed: 01/25/2008 [facility] Patient Information 2015 [place], [place]. This information is not intended to replace advice given to you by your health care provider. Make sure you discuss any questions you have with your health care provider.  Hypertension Hypertension, commonly called high blood pressure, is when the force of blood pumping through your arteries is too strong. Your arteries are the blood vessels that carry blood from your heart throughout your body. A blood pressure reading consists of a higher number over a lower number, such as 110/72. The higher number (systolic) is the pressure inside your arteries when your heart pumps. The lower number (diastolic) is the pressure inside your arteries when your heart relaxes. Ideally you want your blood pressure below 120/80. Hypertension forces your heart to work harder to pump blood. Your arteries may become narrow or stiff. Having hypertension puts you at risk for heart disease, stroke, and other problems.  RISK FACTORS Some risk factors for high blood pressure are controllable. Others are not.  Risk factors you cannot control include:   Race. You may be at higher risk if you are African American.  Age. Risk increases with age.  Gender. Men are at higher risk than women before age 18 years. After age 51, women are at higher  risk than men. Risk factors you can control include:  Not getting enough exercise or physical activity.  Being overweight.  Getting too much fat, sugar, calories, or salt in your diet.  Drinking too much alcohol. SIGNS AND  SYMPTOMS Hypertension does not usually cause signs or symptoms. Extremely high blood pressure (hypertensive crisis) may cause headache, anxiety, shortness of breath, and nosebleed. DIAGNOSIS  To check if you have hypertension, your health care provider will measure your blood pressure while you are seated, with your arm held at the level of your heart. It should be measured at least twice using the same arm. Certain conditions can cause a difference in blood pressure between your right and left arms. A blood pressure reading that is higher than normal on one occasion does not mean that you need treatment. If one blood pressure reading is high, ask your health care provider about having it checked again. TREATMENT  Treating high blood pressure includes making lifestyle changes and possibly taking medicine. Living a healthy lifestyle can help lower high blood pressure. You may need to change some of your habits. Lifestyle changes may include:  Following the DASH diet. This diet is high in fruits, vegetables, and whole grains. It is low in salt, red meat, and added sugars.  Getting at least 2 hours of brisk physical activity every week.  Losing weight if necessary.  Not smoking.  Limiting alcoholic beverages.  Learning ways to reduce stress. If lifestyle changes are not enough to get your blood pressure under control, your health care provider may prescribe medicine. You may need to take more than one. Work closely with your health care provider to understand the risks and benefits. HOME CARE INSTRUCTIONS  Have your blood pressure rechecked as directed by your health care provider.   Take medicines only as directed by your health care provider. Follow the directions carefully. Blood pressure medicines must be taken as prescribed. The medicine does not work as well when you skip doses. Skipping doses also puts you at risk for problems.   Do not smoke.   Monitor your blood pressure at  home as directed by your health care provider. SEEK MEDICAL CARE IF:   You think you are having a reaction to medicines taken.  You have recurrent headaches or feel dizzy.  You have swelling in your ankles.  You have trouble with your vision. SEEK IMMEDIATE MEDICAL CARE IF:  You develop a severe headache or confusion.  You have unusual weakness, numbness, or feel faint.  You have severe chest or abdominal pain.  You vomit repeatedly.  You have trouble breathing. MAKE SURE YOU:   Understand these instructions.  Will watch your condition.  Will get help right away if you are not doing well or get worse. Document Released: 11/28/2005 Document Revised: 04/14/2014 Document Reviewed: 09/20/2013 [facility] Patient Information 2015 [place], [place]. This information is not intended to replace advice given to you by your health care provider. Make sure you discuss any questions you have with your health care provider.

## 2015-04-28 NOTE — ED Provider Notes (Signed)
CSN: [id number]     Arrival date & time 04/28/15  0160 History   First MD Initiated Contact with Patient 04/28/15 [phone]     Chief Complaint  Patient presents with  . Irregular Heart Beat     (Consider location/radiation/quality/duration/timing/severity/associated sxs/prior Treatment) The history is provided by the patient.   74 year old male comes in because he felt like his heart was out of rhythm. He has a history of atrial fibrillation. At home this morning, he felt like his heart was skipping beats. He checked his blood pressure and it was in the range of 170/123. He states his blood pressure normally runs 235 5732 systolic but I diastolic tends to run 20-25. He denies headache, tinnitus, epistaxis. He denies chest pain, heaviness, tightness, pressure. There is been no nausea, vomiting, diaphoresis. Of note, he is anticoagulated on apixaban.  Past Medical History  Diagnosis Date  . Hypertension   . Hyperlipidemia   . Gout   . CAD (coronary artery disease)     a. cath 2010 b. CAD s/p PCI to ramus and LCX on 03/04/15  . AAA (abdominal aortic aneurysm)     stent graft put in 02/2009  . Arthritis   . Paroxysmal atrial fibrillation   . Tobacco abuse    Past Surgical History  Procedure Laterality Date  . Abdominal aortic aneurysm repair  02/19/2009    performed by [facility]  . Colonoscopy    . Knee arthroscopy  2003    left  . Shoulder arthroscopy with rotator cuff repair and subacromial decompression Left 11/19/2013    Procedure: LEFT SHOULDER ARTHROSCOPY DEBRIDEMENT EXTENTSIVE DISTAL CLAVICULECTOMY DECOMPRESSION PARTIAL ACROMIOPLASTY WITH CORACOACROMIAL WITH ROTATOR CUFF REPAIR ;  Surgeon: Renette Butters, MD;  Location: [facility];  Service: Orthopedics;  Laterality: Left;  . Left and right heart catheterization with coronary angiogram N/A 03/03/2015    Procedure: LEFT AND RIGHT HEART CATHETERIZATION WITH CORONARY ANGIOGRAM;  Surgeon: Troy Sine, MD;    . Percutaneous  coronary stent intervention (pci-s) N/A 03/04/2015    Procedure: PERCUTANEOUS CORONARY STENT INTERVENTION (PCI-S);  Surgeon: Troy Sine, MD; RI 99>>0% w/   BMS, bifurcation CFX-OM 90>>40% w/ Angiosculpt PTCA; dCFX 90>>0% w/   2.7520 mm Rebel BMS   Family History  Problem Relation Age of Onset  . Diabetes Sister   . Hypertension Sister   . Cancer Father     prostate  . Hypertension Mother   . Stroke Neg Hx   . Heart attack Neg Hx    History  Substance Use Topics  . Smoking status: Former Smoker -- 0.50 packs/day for 50 years    Types: Cigarettes  . Smokeless tobacco: Never Used  . Alcohol Use: 4.2 - 4.8 oz/week    6 Cans of beer, 1-2 Shots of liquor per week     Comment: couple shots of royal per day    Review of Systems  All other systems reviewed and are negative.     Allergies  Iohexol and Lisinopril  Home Medications   Prior to Admission medications   Medication Sig Start Date End Date Taking? Authorizing Provider  apixaban (ELIQUIS) 5 MG TABS tablet Take 1 tablet (5 mg total) by mouth 2 (two) times daily. 01/29/15   [name], MD  carvedilol (COREG) 6.25 MG tablet Take 1 tablet (6.25 mg total) by mouth 2 (two) times daily with a meal. 03/20/15   Brittainy Erie Noe, PA-C  clopidogrel (PLAVIX) 75 MG tablet Take 1 tablet (  75 mg total) by mouth daily with breakfast. 03/06/15   Evelene Croon Barrett, PA-C  CRESTOR 40 MG tablet Take 40 mg by mouth daily. 11/05/14   Historical Provider, MD  dofetilide (TIKOSYN) 250 MCG capsule Take 1 capsule (250 mcg total) by mouth 2 (two) times daily. 03/20/15   Brittainy Erie Noe, PA-C  dofetilide (TIKOSYN) 250 MCG capsule Take 1 capsule (250 mcg total) by mouth 2 (two) times daily. Patient not taking: Reported on 03/24/2015 03/20/15   Brittainy M Rosita Fire, PA-C  famotidine (PEPCID) 40 MG tablet Take 1 tablet (40 mg total) by mouth 2 (two) times daily. 04/21/15   [name], MD  fish oil-omega-3 fatty acids 1000 MG capsule Take 1 g by  mouth daily.     Historical Provider, MD  furosemide (LASIX) 20 MG tablet Take 1 tablet (20 mg total) by mouth daily. 01/05/15   Robbie Lis, MD  irbesartan (AVAPRO) 300 MG tablet Take 1 tablet (300 mg total) by mouth daily. 03/09/15   Brett Canales, PA-C  isosorbide mononitrate (IMDUR) 60 MG 24 hr tablet Take 1 tablet (60 mg total) by mouth daily. 03/06/15   Evelene Croon Barrett, PA-C  Multiple Vitamin (MULTIVITAMIN WITH MINERALS) TABS tablet Take 1 tablet by mouth daily.    Historical Provider, MD  nicotine (NICODERM CQ - DOSED IN MG/24 HOURS) 14 mg/24hr patch Place 1 patch (14 mg total) onto the skin daily. 02/21/15   Barton Dubois, MD  nitroGLYCERIN (NITROSTAT) 0.4 MG SL tablet Place 1 tablet (0.4 mg total) under the tongue every 5 (five) minutes as needed for chest pain (CP or SOB). 03/09/15   Brett Canales, PA-C  potassium chloride (K-DUR) 10 MEQ tablet Take 1 tablet (10 mEq total) by mouth daily. 01/05/15   Robbie Lis, MD   BP 182/90 mmHg  Pulse 72  Temp(Src) 98.1 F (36.7 C) (Oral)  Resp 16  Ht 6\' 2"  (1.88 m)  Wt 222 lb (100.699 kg)  BMI 28.49 kg/m2  SpO2 95% Physical Exam  Nursing note and vitals reviewed.  74 year old male, resting comfortably and in no acute distress. Vital signs are significant for hypertension. Oxygen saturation is 95%, which is normal. Head is normocephalic and atraumatic. PERRLA, EOMI. Oropharynx is clear. Neck is nontender and supple without adenopathy or JVD. Back is nontender and there is no CVA tenderness. Lungs are clear without rales, wheezes, or rhonchi. Chest is nontender. Heart has regular rate and rhythm without murmur. Abdomen is soft, flat, nontender without masses or hepatosplenomegaly and peristalsis is normoactive. Extremities have no cyanosis or edema, full range of motion is present. Skin is warm and dry without rash. Neurologic: Mental status is normal, cranial nerves are intact, there are no motor or sensory deficits.  ED Course   Procedures (including critical care time) Labs Review Results for orders placed or performed during the hospital encounter of 04/28/15  CBC with Differential  Result Value Ref Range   WBC 2.9 (L) 4.0 - 10.5 K/uL   RBC 4.37 4.22 - 5.81 MIL/uL   Hemoglobin 13.6 13.0 - 17.0 g/dL   HCT 39.6 39.0 - 52.0 %   MCV 90.6 78.0 - 100.0 fL   MCH 31.1 26.0 - 34.0 pg   MCHC 34.3 30.0 - 36.0 g/dL   RDW 14.1 11.5 - 15.5 %   Platelets 112 (L) 150 - 400 K/uL   Neutrophils Relative % 45 43 - 77 %   Neutro Abs 1.3 (L) 1.7 -  7.7 K/uL   Lymphocytes Relative 42 12 - 46 %   Lymphs Abs 1.2 0.7 - 4.0 K/uL   Monocytes Relative 10 3 - 12 %   Monocytes Absolute 0.3 0.1 - 1.0 K/uL   Eosinophils Relative 2 0 - 5 %   Eosinophils Absolute 0.1 0.0 - 0.7 K/uL   Basophils Relative 1 0 - 1 %   Basophils Absolute 0.0 0.0 - 0.1 K/uL  Basic metabolic panel  Result Value Ref Range   Sodium 141 135 - 145 mmol/L   Potassium 3.7 3.5 - 5.1 mmol/L   Chloride 108 101 - 111 mmol/L   CO2 22 22 - 32 mmol/L   Glucose, Bld 105 (H) 65 - 99 mg/dL   BUN 16 6 - 20 mg/dL   Creatinine, Ser 1.24 0.61 - 1.24 mg/dL   Calcium 8.7 (L) 8.9 - 10.3 mg/dL   GFR calc non Af Amer 56 (L) >60 mL/min   GFR calc Af Amer >60 >60 mL/min   Anion gap 11 5 - 15    EKG Interpretation   Date/Time:  Tuesday Apr 28 2015 06:25:00 EDT Ventricular Rate:  71 PR Interval:  185 QRS Duration: 101 QT Interval:  436 QTC Calculation: 474 R Axis:   19 Text Interpretation:  Sinus rhythm Repol abnrm suggests ischemia, lateral  leads When compared with ECG of 03/19/2015, No significant change was found  Confirmed by [facility]  MD, Kadence Mikkelson ([id number]) on 04/28/2015 6:31:23 AM      MDM   Final diagnoses:  PVC's (premature ventricular contractions)  Essential hypertension    Subjective palpitations. Monitor shows sinus ECG shows sinus rhythm. The pattern he is describing sounds like either PACs or PVCs. Will check electrolytes. Blood pressure here is elevated, but  in a range that has been recorded frequently for him. Old records are reviewed confirming history of atrial fibrillation for which he is being maintained on dofetilide.  He did have several occasions where he felt a skipped beat while in the ED. Review of his monitor trend does show occasional PVC which apparently is what he is feeling. His potassium is normal although it is in the lower end of the normal range. I do not see any indication for starting him on any additional antiarrhythmics. He is referred back to his cardiologist for follow-up.  Delora Fuel, MD 71/16/57 [id number]

## 2015-04-28 NOTE — ED Notes (Signed)
Patient presents from home stating he felt like his heart was beating irregular and BP was elevated.  +SOB

## 2015-04-30 ENCOUNTER — Encounter (HOSPITAL_COMMUNITY)
Admission: RE | Admit: 2015-04-30 | Discharge: 2015-04-30 | Disposition: A | Discharge status: Home / Self Care | Payer: Medicare Other | Source: Ambulatory Visit | Attending: Internal Medicine | Admitting: Internal Medicine

## 2015-04-30 NOTE — Progress Notes (Signed)
Cardiac Rehab Medication Review by a Pharmacist  Does the patient  feel that his/her medications are working for him/her?  Yes - with the exception of blood pressure medications. Thinks the dose is too low (MD appointment on Monday)  Has the patient been experiencing any side effects to the medications prescribed?  no  Does the patient measure his/her own blood pressure or blood glucose at home?  yes - BP  Does the patient have any problems obtaining medications due to transportation or finances?   no  Understanding of regimen: excellent Understanding of indications: excellent Potential of compliance: excellent    Pharmacist comments: Patient rates compliance and understanding of medications as excellent. No issues reported.  Elicia Lamp, PharmD Clinical Pharmacist - Resident Pager [phone] 04/30/2015 8:46 AM

## 2015-05-04 ENCOUNTER — Ambulatory Visit (INDEPENDENT_AMBULATORY_CARE_PROVIDER_SITE_OTHER): Payer: Medicare Other | Admitting: Internal Medicine

## 2015-05-04 ENCOUNTER — Encounter: Payer: Self-pay | Admitting: Internal Medicine

## 2015-05-04 VITALS — BP 160/74 | HR 55 | Ht 74.0 in | Wt 220.9 lb

## 2015-05-04 DIAGNOSIS — I480 Paroxysmal atrial fibrillation: Secondary | ICD-10-CM

## 2015-05-04 DIAGNOSIS — Z5181 Encounter for therapeutic drug level monitoring: Secondary | ICD-10-CM | POA: Diagnosis not present

## 2015-05-04 DIAGNOSIS — I4891 Unspecified atrial fibrillation: Secondary | ICD-10-CM | POA: Diagnosis not present

## 2015-05-04 DIAGNOSIS — Z79899 Other long term (current) drug therapy: Secondary | ICD-10-CM

## 2015-05-04 DIAGNOSIS — I10 Essential (primary) hypertension: Secondary | ICD-10-CM

## 2015-05-04 MED ORDER — AMLODIPINE BESYLATE 5 MG PO TABS: 5.0000 mg | ORAL_TABLET | Freq: Every day | ORAL | Status: DC

## 2015-05-04 NOTE — Progress Notes (Signed)
OFFICE NOTE  Chief Complaint:  Follow-up hospitalization  Primary Care Physician: [name], MD  HPI:  WERNER LABELLA is a 74 y.o. male with a past medical history significant for CAD, hypertension, dyslipidemia, and AAA s/p stent-grafting in 2012 by Dr. Kellie Simmering. Preoperatively, he underwent a stress test which was abnormal, showing an inferior defect consistent with possible scar and mild to moderate LV dysfunction. He had a LHC, which revealed the following:  ANGIOGRAPHY: Left main: The left main is fairly normal.  The left anterior descending artery has some proximal luminal irregularities between 20 and 30%. The mid vessel stenosis around 30- 40%. It gives off several small diagonal arteries. The first diagonal artery is moderate in size and has mild irregularities. The second diagonal artery is very tiny and has an 80% proximal stenosis. This vessel is quite small and is not a candidate for PTCA.  The remaining LAD has only minor luminal irregularities.  The left circumflex artery is a large vessel. It gives off a very high obtuse marginal artery. The remaining circumflex artery continues around the A-V groove and supplies a posterolateral branch.  The first obtuse marginal artery is subtotally occluded at 2 different sites. There was sluggish TIMI grade 1 flow through this vessel. This vessel reaches around the lateral wall and supplies a small amount of the inferior left ventricle. This was quite likely where the site of the abnormal Cardiolite.  The right coronary artery is large and dominant. There are minor luminal irregularities. The posterior descending artery and the posterolateral segment artery, they taper fairly quickly at the takeoff. This taper improved slightly with intracoronary nitroglycerin. There are only minor luminal irregularities between 20 and 30%.  The left ventriculogram was performed in a  30-RAO position. It reveals a moderately dilated and hypocontractile left ventricle. The ejection fraction is probably 40%.  During this left ventriculogram, we noticed that he had a very dilated aortic root. We performed an aortic root angiography, which revealed a dilated aortic root. There was no evidence of aortic insufficiency.  He now presents with accelerated hypertension and dyspnea, but no chest pain -troponin is borderline elevated at 0.05 and 0.07, BNP is elevated at 331. CXR demonstrates pulmonary edema. EKG shows sinus rhythm with LVH and lateral repolarization changes. Cardiology is asked to consult regarding management.  Mr. Dewald was recently hospitalized and treated for heart failure. He started on diuretic and we discontinued his nifedipine and switched him to carvedilol. He recently followed up with his primary care provider who noted he was in new onset atrial fibrillation. It's feasible that atrial fibrillation was the precipitating factor for his heart failure. He seems to be unaware of his atrial fibrillation. A repeat EKG in our office today shows that he is converted back to sinus rhythm but he does have deep anterolateral and inferior T-wave inversions. These could be due to repolarization secondary to LVH however given his history of known coronary artery disease, could represent ischemia.  I saw Mr. Kendrick back in the office today. He underwent a nuclear stress test which was interpreted as follows:  Overall Impression: High risk stress nuclear study due to  severely decreased LV function (EF 28% - down from 445). There is a moderate inferior wall scar. No reversible ischemia is seen.. Consider multivessel CAD  versus adverse post-infarction remodeling as cause of worsening  Cardiomyopathy.  Mr. Ogas returns today for follow-up. Recently he underwent cardiac catheterization and underwent stenting to the ramus and  circumflex as follows:  Difficult but  successful complex intervention involving PTCA/BM stenting of a subtotal ramus intermediate vessel with the entire proximal region being reduced to 0% in an intermediate vessel which extended to the LV apex once opened; and Angiosculpt/PTCA of bifurcation stenosis involving the distal circumflex flex marginal branch and Angiosculpt/BM stenting of a dominant distal circumflex extending into the PD/PLA vessel.  Subsequently he was noted to have increasing problems with palpitations and atrial fibrillation with rapid ventricular response. We elected to start him on dofetilide which he did well with however had some QT prolongation on the 500 g dose. Subsequently was placed on 250 g twice a day and is tolerating that well. Overall he feels well and has not had recurrence since discharge. He did feel subjective palpitations recently and went to the emergency room. He also was notably hypertensive. His rhythm was sinus. Blood pressure still is persistently elevated despite being on max dose irbesartan, carvedilol, Imdur and Lasix. He denies any chest pain. He successfully stop smoking and currently using nicotine patches but is having cravings.  PMHx:  Past Medical History  Diagnosis Date  . Hypertension   . Hyperlipidemia   . Gout   . CAD (coronary artery disease)     a. cath 2010 b. CAD s/p PCI to ramus and LCX on 03/04/15  . AAA (abdominal aortic aneurysm)     stent graft put in 02/2009  . Arthritis   . Paroxysmal atrial fibrillation   . Tobacco abuse     Past Surgical History  Procedure Laterality Date  . Abdominal aortic aneurysm repair  02/19/2009    performed by [facility]  . Colonoscopy    . Knee arthroscopy  2003    left  . Shoulder arthroscopy with rotator cuff repair and subacromial decompression Left 11/19/2013    Procedure: LEFT SHOULDER ARTHROSCOPY DEBRIDEMENT EXTENTSIVE DISTAL CLAVICULECTOMY DECOMPRESSION PARTIAL ACROMIOPLASTY WITH CORACOACROMIAL WITH ROTATOR CUFF REPAIR ;  Surgeon:  Renette Butters, MD;  Location: [facility];  Service: Orthopedics;  Laterality: Left;  . Left and right heart catheterization with coronary angiogram N/A 03/03/2015    Procedure: LEFT AND RIGHT HEART CATHETERIZATION WITH CORONARY ANGIOGRAM;  Surgeon: Troy Sine, MD;    . Percutaneous coronary stent intervention (pci-s) N/A 03/04/2015    Procedure: PERCUTANEOUS CORONARY STENT INTERVENTION (PCI-S);  Surgeon: Troy Sine, MD; RI 99>>0% w/   BMS, bifurcation CFX-OM 90>>40% w/ Angiosculpt PTCA; dCFX 90>>0% w/   2.7520 mm Rebel BMS    FAMHx:  Family History  Problem Relation Age of Onset  . Diabetes Sister   . Hypertension Sister   . Cancer Father     prostate  . Hypertension Mother   . Stroke Neg Hx   . Heart attack Neg Hx     SOCHx:   reports that he has quit smoking. His smoking use included Cigarettes. He has a 25 pack-year smoking history. He has never used smokeless tobacco. He reports that he drinks about 4.2 - 4.8 oz of alcohol per week. He reports that he does not use illicit drugs.  ALLERGIES:  Allergies  Allergen Reactions  . Iohexol      Code: RASH, Desc: PAITENT STATED THAT HE BEGAN ITCHING TOWARDS END OF INJECTIN OF IV CONTRAST-- 13 HR PREP RECOMMENDED/MMS   . Lisinopril Cough    ROS: A comprehensive review of systems was negative except for: Cardiovascular: positive for Hypertension  HOME MEDS: Current Outpatient Prescriptions  Medication Sig Dispense Refill  .  apixaban (ELIQUIS) 5 MG TABS tablet Take 1 tablet (5 mg total) by mouth 2 (two) times daily. 60 tablet 6  . aspirin EC 81 MG tablet Take 81 mg by mouth daily.    . carvedilol (COREG) 6.25 MG tablet Take 1 tablet (6.25 mg total) by mouth 2 (two) times daily with a meal. 60 tablet 5  . CRESTOR 40 MG tablet Take 40 mg by mouth daily.  4  . dofetilide (TIKOSYN) 250 MCG capsule Take 1 capsule (250 mcg total) by mouth 2 (two) times daily. 60 capsule 5  . famotidine (PEPCID) 40 MG tablet Take 1  tablet (40 mg total) by mouth 2 (two) times daily. 180 tablet 1  . fish oil-omega-3 fatty acids 1000 MG capsule Take 1 g by mouth daily.     . furosemide (LASIX) 20 MG tablet Take 1 tablet (20 mg total) by mouth daily. 30 tablet 0  . irbesartan (AVAPRO) 300 MG tablet Take 1 tablet (300 mg total) by mouth daily. 30 tablet 11  . isosorbide mononitrate (IMDUR) 60 MG 24 hr tablet Take 1 tablet (60 mg total) by mouth daily. 30 tablet 11  . loratadine (CLARITIN) 10 MG tablet Take 10 mg by mouth daily as needed for allergies.    . Multiple Vitamin (MULTIVITAMIN WITH MINERALS) TABS tablet Take 1 tablet by mouth daily.    . nicotine (NICODERM CQ - DOSED IN MG/24 HR) 7 mg/24hr patch Place 7 mg onto the skin daily.    . nitroGLYCERIN (NITROSTAT) 0.4 MG SL tablet Place 1 tablet (0.4 mg total) under the tongue every 5 (five) minutes as needed for chest pain (CP or SOB). 25 tablet 12  . potassium chloride (K-DUR) 10 MEQ tablet Take 1 tablet (10 mEq total) by mouth daily. 30 tablet 0  . amLODipine (NORVASC) 5 MG tablet Take 1 tablet (5 mg total) by mouth daily. 90 tablet 3   No current facility-administered medications for this visit.    LABS/IMAGING: No results found for this or any previous visit (from the past 48 hour(s)). No results found.  VITALS: BP 160/74 mmHg  Pulse 55  Ht 6\' 2"  (1.88 m)  Wt 220 lb 14.4 oz (100.2 kg)  BMI 28.35 kg/m2  EXAM: General appearance: alert and no distress Neck: no carotid bruit and no JVD Lungs: clear to auscultation bilaterally Heart: regular rate and rhythm, S1, S2 normal, no murmur, click, rub or gallop Abdomen: soft, non-tender; bowel sounds normal; no masses,  no organomegaly Extremities: extremities normal, atraumatic, no cyanosis or edema Pulses: 2+ and symmetric Skin: Skin color, texture, turgor normal. No rashes or lesions Neurologic: Grossly normal Psych: Pleasant  EKG: Sinus bradycardia 55, inferior and anterolateral T-wave abnormalities with QTC  482 ms  ASSESSMENT: 1. Recent PCI to the ramus intermedius and distal circumflex arteries 2. 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 Accelerated hypertension 4. History of abdominal aortic aneurysm repair 5. Paroxysmal atrial fibrillation - CHADSVASC 4, on dofetilide  PLAN: 1.   Mr. Baugh has not experienced any more angina since recent PCI. Blood pressure has been creeping up for unknown reasons. I recommend starting amlodipine 5 mg daily today. His QTC is stable on tedious in. I reviewed his medicines and did not see any potential interactions. He is scheduled to see the [place] as a primary care provider next week. He is highly advised not to start any new medications without discussing it with [place] due to interactions with dofetilide. I will  schedule follow-up with him for the hypertension clinic in 2 weeks for follow-up of his blood pressure.  [name], MD, [facility] Attending Cardiologist [place] Ch[place]ita Mendel 05/04/2015, 9:17 AM

## 2015-05-04 NOTE — Patient Instructions (Signed)
Your physician has recommended you make the following change in your medication: START amlodipine 5mg  once daily - for blood pressure  Your physician recommends that you schedule a follow-up appointment in 3 weeks with Erasmo Downer (pharmacist) for blood pressure check   Your physician wants you to follow-up in: 6 months with Dr. Debara Pickett. You will receive a reminder letter in the mail two months in advance. If you don't receive a letter, please call our office to schedule the follow-up appointment.

## 2015-05-05 ENCOUNTER — Telehealth (HOSPITAL_COMMUNITY): Payer: Self-pay | Admitting: *Deleted

## 2015-05-06 ENCOUNTER — Encounter (HOSPITAL_COMMUNITY): Payer: Medicare Other

## 2015-05-08 ENCOUNTER — Encounter (HOSPITAL_COMMUNITY): Payer: Medicare Other

## 2015-05-13 ENCOUNTER — Encounter (HOSPITAL_COMMUNITY): Payer: Medicare Other

## 2015-05-15 ENCOUNTER — Encounter (HOSPITAL_COMMUNITY): Payer: Medicare Other

## 2015-05-18 ENCOUNTER — Encounter (HOSPITAL_COMMUNITY): Payer: Medicare Other

## 2015-05-20 ENCOUNTER — Encounter (HOSPITAL_COMMUNITY): Payer: Medicare Other

## 2015-05-21 ENCOUNTER — Other Ambulatory Visit: Payer: Self-pay | Admitting: Pharmacist Clinician (PhC)/ Clinical Pharmacy Specialist

## 2015-05-21 MED ORDER — ATORVASTATIN CALCIUM 80 MG PO TABS: 80.0000 mg | ORAL_TABLET | Freq: Every day | ORAL | Status: AC

## 2015-05-21 NOTE — Telephone Encounter (Signed)
Pt concerned about high cost of Crestor.  Now that it is generic, the copay is actually higher for him.  Suggested we switch to atorvastatin 80.  His last LDL (2 months ago) was actually very good at 46 with total cholesterol of 125, but we have no previous labs for comparison.  Will therefore switch to equivalent atorvastatin dose.

## 2015-05-22 ENCOUNTER — Encounter (HOSPITAL_COMMUNITY): Payer: Medicare Other

## 2015-05-25 ENCOUNTER — Encounter (HOSPITAL_COMMUNITY): Payer: Medicare Other

## 2015-05-26 ENCOUNTER — Ambulatory Visit (INDEPENDENT_AMBULATORY_CARE_PROVIDER_SITE_OTHER): Payer: Medicare Other | Admitting: Pharmacist Clinician (PhC)/ Clinical Pharmacy Specialist

## 2015-05-26 ENCOUNTER — Encounter: Payer: Self-pay | Admitting: Pharmacist Clinician (PhC)/ Clinical Pharmacy Specialist

## 2015-05-26 VITALS — BP 176/98 | HR 60 | Ht 74.0 in | Wt 218.4 lb

## 2015-05-26 DIAGNOSIS — I10 Essential (primary) hypertension: Secondary | ICD-10-CM | POA: Diagnosis not present

## 2015-05-26 MED ORDER — AMLODIPINE BESYLATE 10 MG PO TABS: 10.0000 mg | ORAL_TABLET | Freq: Every day | ORAL | Status: DC

## 2015-05-26 MED ORDER — APIXABAN 5 MG PO TABS: 5.0000 mg | ORAL_TABLET | Freq: Two times a day (BID) | ORAL | Status: AC

## 2015-05-26 NOTE — Patient Instructions (Addendum)
Return for a a follow up appointment in 3 - 4 weeks   Your blood pressure today is 176/98  (goal is <150/90)  Check your blood pressure at home daily and keep record of the readings.  Take your BP meds as follows: increase amlodipine to 10 mg daily, in the evenings.  Continue all other meds.  Bring all of your meds, your BP cuff and your record of home blood pressures to your next appointment.  Exercise as you're able, try to walk approximately 30 minutes per day.  Keep salt intake to a minimum, especially watch canned and prepared boxed foods.  Eat more fresh fruits and vegetables and fewer canned items.  Avoid eating in fast food restaurants.    HOW TO TAKE YOUR BLOOD PRESSURE: . Rest 5 minutes before taking your blood pressure. .  Don't smoke or drink caffeinated beverages for at least 30 minutes before. . Take your blood pressure before (not after) you eat. . Sit comfortably with your back supported and both feet on the floor (don't cross your legs). . Elevate your arm to heart level on a table or a desk. . Use the proper sized cuff. It should fit smoothly and snugly around your bare upper arm. There should be enough room to slip a fingertip under the cuff. The bottom edge of the cuff should be 1 inch above the crease of the elbow. . Ideally, take 3 measurements at one sitting and record the average.

## 2015-05-26 NOTE — Assessment & Plan Note (Signed)
Today his BP remains elevated at 176/98, with a goal of < 150.90.  His home meter appears accurate, as it read within 5 points of office cuff.  I will have him increase the amlodipine to 10 mg each evening and return in 3-4 weeks for a follow up.  Continue all other meds.

## 2015-05-26 NOTE — Progress Notes (Signed)
05/26/2015 Rodney [name] [birth year] [id number]   HPI:  Rodney [name] is a 74 y.o. male patient of Dr Debara Pickett, with a PMH below who presents today for hypertension clinic evaluation.  Cardiac Hx:  CAD, dyslipidemia, AAA s/p stent grafting in 2012, HF, AF  Social Hx: currently using nicotine patches, went from 14 mg to 7 mg in past week, but had to bump back to 14 mg for a few more weeks; drinks [employer] daily, usually 1 cup coffee per day  Diet: states that his wife is watching their salt intake, no added salt.   Exercise: cardiac rehab on hold due to elevated pressure  Home BP readings: has newer Omron cuff - home readings range 140-190/90-105;    Current antihypertensive medications: amlodipine 5 mg each evening; irbesartan 300 mg each morning (has not yet taken today), carvedilol 6.25 mg bid  Current Outpatient Prescriptions  Medication Sig Dispense Refill  . amLODipine (NORVASC) 5 MG tablet Take 1 tablet (5 mg total) by mouth daily. 90 tablet 3  . apixaban (ELIQUIS) 5 MG TABS tablet Take 1 tablet (5 mg total) by mouth 2 (two) times daily. 180 tablet 3  . aspirin EC 81 MG tablet Take 81 mg by mouth daily.    Marland Kitchen atorvastatin (LIPITOR) 80 MG tablet Take 1 tablet (80 mg total) by mouth daily. 90 tablet 3  . carvedilol (COREG) 6.25 MG tablet Take 1 tablet (6.25 mg total) by mouth 2 (two) times daily with a meal. 60 tablet 5  . CRESTOR 40 MG tablet Take 40 mg by mouth daily.  4  . dofetilide (TIKOSYN) 250 MCG capsule Take 1 capsule (250 mcg total) by mouth 2 (two) times daily. 60 capsule 5  . famotidine (PEPCID) 40 MG tablet Take 1 tablet (40 mg total) by mouth 2 (two) times daily. 180 tablet 1  . fish oil-omega-3 fatty acids 1000 MG capsule Take 1 g by mouth daily.     . furosemide (LASIX) 20 MG tablet Take 1 tablet (20 mg total) by mouth daily. 30 tablet 0  . irbesartan (AVAPRO) 300 MG tablet Take 1 tablet (300 mg total) by mouth daily. 30 tablet 11  . isosorbide mononitrate  (IMDUR) 60 MG 24 hr tablet Take 1 tablet (60 mg total) by mouth daily. 30 tablet 11  . loratadine (CLARITIN) 10 MG tablet Take 10 mg by mouth daily as needed for allergies.    . Multiple Vitamin (MULTIVITAMIN WITH MINERALS) TABS tablet Take 1 tablet by mouth daily.    . nicotine (NICODERM CQ - DOSED IN MG/24 HR) 7 mg/24hr patch Place 7 mg onto the skin daily.    . nitroGLYCERIN (NITROSTAT) 0.4 MG SL tablet Place 1 tablet (0.4 mg total) under the tongue every 5 (five) minutes as needed for chest pain (CP or SOB). 25 tablet 12  . potassium chloride (K-DUR) 10 MEQ tablet Take 1 tablet (10 mEq total) by mouth daily. 30 tablet 0   No current facility-administered medications for this visit.    Allergies  Allergen Reactions  . Iohexol      Code: RASH, Desc: PAITENT STATED THAT HE BEGAN ITCHING TOWARDS END OF INJECTIN OF IV CONTRAST-- 13 HR PREP RECOMMENDED/MMS   . Lisinopril Cough    Past Medical History  Diagnosis Date  . Hypertension   . Hyperlipidemia   . Gout   . CAD (coronary artery disease)     a. cath 2010 b. CAD s/p PCI to ramus and  LCX on 03/04/15  . AAA (abdominal aortic aneurysm)     stent graft put in 02/2009  . Arthritis   . Paroxysmal atrial fibrillation   . Tobacco abuse     Blood pressure 176/98, pulse 60, height 6\' 2"  (1.88 m), weight 218 lb 6.4 oz (99.066 kg).    Tommy Medal PharmD CPP [facility]

## 2015-05-27 ENCOUNTER — Encounter (HOSPITAL_COMMUNITY): Payer: Medicare Other

## 2015-05-29 ENCOUNTER — Encounter (HOSPITAL_COMMUNITY): Payer: Medicare Other

## 2015-06-01 ENCOUNTER — Encounter (HOSPITAL_COMMUNITY): Payer: Medicare Other

## 2015-06-02 ENCOUNTER — Telehealth (HOSPITAL_COMMUNITY): Payer: Self-pay | Admitting: *Deleted

## 2015-06-03 ENCOUNTER — Encounter (HOSPITAL_COMMUNITY): Payer: Medicare Other

## 2015-06-05 ENCOUNTER — Encounter (HOSPITAL_COMMUNITY): Payer: Medicare Other

## 2015-06-08 ENCOUNTER — Encounter (HOSPITAL_COMMUNITY): Payer: Medicare Other

## 2015-06-10 ENCOUNTER — Encounter (HOSPITAL_COMMUNITY): Payer: Medicare Other

## 2015-06-12 ENCOUNTER — Encounter (HOSPITAL_COMMUNITY): Payer: Medicare Other

## 2015-06-17 ENCOUNTER — Encounter (HOSPITAL_COMMUNITY): Payer: Medicare Other

## 2015-06-18 ENCOUNTER — Encounter: Payer: Self-pay | Admitting: Pharmacist Clinician (PhC)/ Clinical Pharmacy Specialist

## 2015-06-18 ENCOUNTER — Ambulatory Visit (INDEPENDENT_AMBULATORY_CARE_PROVIDER_SITE_OTHER): Payer: Medicare Other | Admitting: Pharmacist Clinician (PhC)/ Clinical Pharmacy Specialist

## 2015-06-18 VITALS — BP 142/78 | HR 64 | Ht 74.0 in | Wt 221.3 lb

## 2015-06-18 DIAGNOSIS — I10 Essential (primary) hypertension: Secondary | ICD-10-CM

## 2015-06-18 NOTE — Patient Instructions (Addendum)
Your blood pressure today is 142/78  Check your blood pressure at home several times each week and keep record of the readings.  Take your BP meds as follows: continue all current medications  Bring all of your meds, your BP cuff and your record of home blood pressures to your next appointment.  Exercise as you're able, try to walk approximately 30 minutes per day.  Keep salt intake to a minimum, especially watch canned and prepared boxed foods.  Eat more fresh fruits and vegetables and fewer canned items.  Avoid eating in fast food restaurants.    HOW TO TAKE YOUR BLOOD PRESSURE: . Rest 5 minutes before taking your blood pressure. .  Don't smoke or drink caffeinated beverages for at least 30 minutes before. . Take your blood pressure before (not after) you eat. . Sit comfortably with your back supported and both feet on the floor (don't cross your legs). . Elevate your arm to heart level on a table or a desk. . Use the proper sized cuff. It should fit smoothly and snugly around your bare upper arm. There should be enough room to slip a fingertip under the cuff. The bottom edge of the cuff should be 1 inch above the crease of the elbow. . Ideally, take 3 measurements at one sitting and record the average.

## 2015-06-18 NOTE — Progress Notes (Signed)
06/18/2015 RANEY KOEPPEN 01/16/1941 [id number]   HPI:  Rodney [name] is a 74 y.o. male patient of Dr Debara Pickett, with a PMH below who presents today for hypertension clinic follow up.  When I saw him last his BP was elevated at 176/98, he had not taken any meds yet that day.  At that time I increased the amlodipine to 10 mg each night and today he reports that his pressure has been gradually declining since that time.  A recent visit to the [place] showed normal pressure and in the past 2 weeks has only had 1 reading >923 systolic with no diastolics >30.    Cardiac Hx:  CAD, dyslipidemia, AAA s/p stent grafting in 2012, HF, AF  Social Hx: currently using nicotine patches, still at 14 mg; drinks [employer] daily, usually 1 cup coffee per day  Diet: states that his wife is watching their salt intake, no added salt.   Exercise: cardiac rehab on hold due to elevated pressure  Home BP readings: has newer Omron cuff - home readings range 130-140s/70-80;    Current antihypertensive medications: amlodipine 10 mg each evening; irbesartan 300 mg each morning, carvedilol 6.25 mg bid  Current Outpatient Prescriptions  Medication Sig Dispense Refill  . amLODipine (NORVASC) 10 MG tablet Take 1 tablet (10 mg total) by mouth daily. 30 tablet 3  . apixaban (ELIQUIS) 5 MG TABS tablet Take 1 tablet (5 mg total) by mouth 2 (two) times daily. 180 tablet 3  . aspirin EC 81 MG tablet Take 81 mg by mouth daily.    Marland Kitchen atorvastatin (LIPITOR) 80 MG tablet Take 1 tablet (80 mg total) by mouth daily. 90 tablet 3  . carvedilol (COREG) 6.25 MG tablet Take 1 tablet (6.25 mg total) by mouth 2 (two) times daily with a meal. 60 tablet 5  . dofetilide (TIKOSYN) 250 MCG capsule Take 1 capsule (250 mcg total) by mouth 2 (two) times daily. 60 capsule 5  . famotidine (PEPCID) 40 MG tablet Take 1 tablet (40 mg total) by mouth 2 (two) times daily. 180 tablet 1  . fish oil-omega-3 fatty acids 1000 MG capsule Take 1 g by mouth daily.      . furosemide (LASIX) 20 MG tablet Take 1 tablet (20 mg total) by mouth daily. 30 tablet 0  . irbesartan (AVAPRO) 300 MG tablet Take 1 tablet (300 mg total) by mouth daily. 30 tablet 11  . isosorbide mononitrate (IMDUR) 60 MG 24 hr tablet Take 1 tablet (60 mg total) by mouth daily. 30 tablet 11  . loratadine (CLARITIN) 10 MG tablet Take 10 mg by mouth daily as needed for allergies.    . Multiple Vitamin (MULTIVITAMIN WITH MINERALS) TABS tablet Take 1 tablet by mouth daily.    . nicotine (NICODERM CQ - DOSED IN MG/24 HR) 7 mg/24hr patch Place 7 mg onto the skin daily.    . nitroGLYCERIN (NITROSTAT) 0.4 MG SL tablet Place 1 tablet (0.4 mg total) under the tongue every 5 (five) minutes as needed for chest pain (CP or SOB). 25 tablet 12  . potassium chloride (K-DUR) 10 MEQ tablet Take 1 tablet (10 mEq total) by mouth daily. 30 tablet 0   No current facility-administered medications for this visit.    Allergies  Allergen Reactions  . Iohexol      Code: RASH, Desc: PAITENT STATED THAT HE BEGAN ITCHING TOWARDS END OF INJECTIN OF IV CONTRAST-- 13 HR PREP RECOMMENDED/MMS   . Lisinopril Cough  Past Medical History  Diagnosis Date  . Hypertension   . Hyperlipidemia   . Gout   . CAD (coronary artery disease)     a. cath 2010 b. CAD s/p PCI to ramus and LCX on 03/04/15  . AAA (abdominal aortic aneurysm)     stent graft put in 02/2009  . Arthritis   . Paroxysmal atrial fibrillation   . Tobacco abuse     Blood pressure 142/78, pulse 64, height 6\' 2"  (1.88 m), weight 221 lb 4.8 oz (100.381 kg).    Tommy Medal PharmD CPP [facility]

## 2015-06-18 NOTE — Assessment & Plan Note (Signed)
Today his BP is much improved.  Home readings indicate that he is now well controlled.  Patient would like to get back to cardiac rehab at this time, will arrange that with Dr. Lysbeth Penner nurse.  For now he is to continue with his current medications and continue home BP monitoring several times each week.  I have asked that he call the office if he notices an increase in readings > 100 systolic or has any other cardiac concerns.

## 2015-06-19 ENCOUNTER — Encounter (HOSPITAL_COMMUNITY): Payer: Medicare Other

## 2015-06-22 ENCOUNTER — Encounter (HOSPITAL_COMMUNITY): Payer: Medicare Other

## 2015-06-23 ENCOUNTER — Telehealth (HOSPITAL_COMMUNITY): Payer: Self-pay | Admitting: *Deleted

## 2015-06-24 ENCOUNTER — Encounter (HOSPITAL_COMMUNITY): Payer: Medicare Other

## 2015-06-26 ENCOUNTER — Encounter (HOSPITAL_COMMUNITY): Payer: Medicare Other

## 2015-06-29 ENCOUNTER — Encounter (HOSPITAL_COMMUNITY): Payer: Medicare Other

## 2015-07-01 ENCOUNTER — Encounter (HOSPITAL_COMMUNITY): Payer: Medicare Other

## 2015-07-02 ENCOUNTER — Encounter (HOSPITAL_COMMUNITY)
Admission: RE | Admit: 2015-07-02 | Discharge: 2015-07-02 | Disposition: A | Discharge status: Home / Self Care | Payer: Medicare Other | Source: Ambulatory Visit | Attending: Internal Medicine | Admitting: Internal Medicine

## 2015-07-02 DIAGNOSIS — Z955 Presence of coronary angioplasty implant and graft: Secondary | ICD-10-CM | POA: Insufficient documentation

## 2015-07-02 NOTE — Progress Notes (Signed)
Cardiac Rehab Medication Review by a Pharmacist  Does the patient  feel that his/her medications are working for him/her?  yes  Has the patient been experiencing any side effects to the medications prescribed?  no  Does the patient measure his/her own blood pressure or blood glucose at home?  yes   Does the patient have any problems obtaining medications due to transportation or finances?   no  Understanding of regimen: excellent Understanding of indications: good Potential of compliance: excellent    Pharmacist comments: Sade Dalton, 74 y/o M, presented to Cardiac rehab in no distress, and good spirits.  Pt has a great understanding of his medication regimen and endorses compliance.  Pt increased his Nicotine patch from 7mg  to 14mg  and has not smoked since.   Angela Burke, PharmD Pharmacy Resident Pager: [phone] 07/02/2015 8:06 AM

## 2015-07-03 ENCOUNTER — Encounter (HOSPITAL_COMMUNITY): Payer: Medicare Other

## 2015-07-03 ENCOUNTER — Telehealth: Payer: Self-pay | Admitting: *Deleted

## 2015-07-03 NOTE — Telephone Encounter (Signed)
Faxed order for phase 2 cardiac rehab - no GXT required.  

## 2015-07-06 ENCOUNTER — Encounter (HOSPITAL_COMMUNITY): Payer: Medicare Other

## 2015-07-06 ENCOUNTER — Encounter (HOSPITAL_COMMUNITY)
Admission: RE | Admit: 2015-07-06 | Discharge: 2015-07-06 | Disposition: A | Discharge status: Home / Self Care | Payer: Medicare Other | Source: Ambulatory Visit | Attending: Internal Medicine | Admitting: Internal Medicine

## 2015-07-06 DIAGNOSIS — Z955 Presence of coronary angioplasty implant and graft: Secondary | ICD-10-CM | POA: Diagnosis not present

## 2015-07-06 NOTE — Progress Notes (Signed)
Pt started cardiac rehab today.  Pt tolerated light exercise without difficulty. VSS, telemetry-Sinus Rhythm, asymptomatic.  Medication list reconciled.  Pt verbalized compliance with medications and denies barriers to compliance. PSYCHOSOCIAL ASSESSMENT:  PHQ-0. Pt exhibits positive coping skills, hopeful outlook with supportive family. No psychosocial needs identified at this time, no psychosocial interventions necessary.    Pt enjoys playing golf.   Pt cardiac rehab  goal is  to loose 6-7lbs.  Pt encouraged to participate in exercising on your own to increase ability to achieve these goals.   Pt long term cardiac rehab goal is increase muscle tone and maintain weight loss.  Pt oriented to exercise equipment and routine.  Understanding verbalized. Rodney [name] was noted to have a short 4 beat run to atrial fibrillation prior to his walk test. Patient asymptomatic. Dr [facility] office called and notified spoke with Charlotte Sanes RN. Today's ECG tracings faxed over to Dr [facility] office for review. No further arrhythmia's noted. Will continue to monitor the patient throughout  the program.

## 2015-07-08 ENCOUNTER — Encounter (HOSPITAL_COMMUNITY): Payer: Medicare Other

## 2015-07-08 ENCOUNTER — Encounter (HOSPITAL_COMMUNITY)
Admission: RE | Admit: 2015-07-08 | Discharge: 2015-07-08 | Disposition: A | Discharge status: Home / Self Care | Payer: Medicare Other | Source: Ambulatory Visit | Attending: Internal Medicine | Admitting: Internal Medicine

## 2015-07-08 DIAGNOSIS — Z955 Presence of coronary angioplasty implant and graft: Secondary | ICD-10-CM | POA: Diagnosis not present

## 2015-07-08 NOTE — Progress Notes (Signed)
Reviewed home exercise with pt today.  Pt plans to return to [facility] for [facility] [employer] for exercise.  He is also an avid golfer and temperature guidelines were reviewed.  Reviewed THR, pulse, RPE, sign and symptoms, NTG use, and when to call 911 or MD.  Pt voiced understanding. Alberteen Sam, MA, ACSM RCEP

## 2015-07-10 ENCOUNTER — Encounter (HOSPITAL_COMMUNITY): Payer: Medicare Other

## 2015-07-10 ENCOUNTER — Encounter (HOSPITAL_COMMUNITY)
Admission: RE | Admit: 2015-07-10 | Discharge: 2015-07-10 | Disposition: A | Discharge status: Home / Self Care | Payer: Medicare Other | Source: Ambulatory Visit | Attending: Internal Medicine | Admitting: Internal Medicine

## 2015-07-10 DIAGNOSIS — Z955 Presence of coronary angioplasty implant and graft: Secondary | ICD-10-CM | POA: Diagnosis not present

## 2015-07-13 ENCOUNTER — Encounter (HOSPITAL_COMMUNITY)
Admission: RE | Admit: 2015-07-13 | Discharge: 2015-07-13 | Disposition: A | Discharge status: Home / Self Care | Payer: Medicare Other | Source: Ambulatory Visit | Attending: Internal Medicine | Admitting: Internal Medicine

## 2015-07-13 ENCOUNTER — Encounter (HOSPITAL_COMMUNITY): Payer: Medicare Other

## 2015-07-13 DIAGNOSIS — Z955 Presence of coronary angioplasty implant and graft: Secondary | ICD-10-CM | POA: Insufficient documentation

## 2015-07-15 ENCOUNTER — Encounter (HOSPITAL_COMMUNITY): Payer: Medicare Other

## 2015-07-15 ENCOUNTER — Encounter (HOSPITAL_COMMUNITY)
Admission: RE | Admit: 2015-07-15 | Discharge: 2015-07-15 | Disposition: A | Discharge status: Home / Self Care | Payer: Medicare Other | Source: Ambulatory Visit | Attending: Internal Medicine | Admitting: Internal Medicine

## 2015-07-15 DIAGNOSIS — Z955 Presence of coronary angioplasty implant and graft: Secondary | ICD-10-CM | POA: Diagnosis not present

## 2015-07-16 ENCOUNTER — Encounter: Payer: Self-pay | Admitting: Internal Medicine

## 2015-07-17 ENCOUNTER — Encounter (HOSPITAL_COMMUNITY)
Admission: RE | Admit: 2015-07-17 | Discharge: 2015-07-17 | Disposition: A | Discharge status: Home / Self Care | Payer: Medicare Other | Source: Ambulatory Visit | Attending: Internal Medicine | Admitting: Internal Medicine

## 2015-07-17 ENCOUNTER — Encounter (HOSPITAL_COMMUNITY): Payer: Medicare Other

## 2015-07-17 DIAGNOSIS — Z955 Presence of coronary angioplasty implant and graft: Secondary | ICD-10-CM | POA: Diagnosis not present

## 2015-07-20 ENCOUNTER — Encounter (HOSPITAL_COMMUNITY)
Admission: RE | Admit: 2015-07-20 | Discharge: 2015-07-20 | Disposition: A | Discharge status: Home / Self Care | Payer: Medicare Other | Source: Ambulatory Visit | Attending: Internal Medicine | Admitting: Internal Medicine

## 2015-07-20 ENCOUNTER — Encounter (HOSPITAL_COMMUNITY): Payer: Medicare Other

## 2015-07-20 DIAGNOSIS — Z955 Presence of coronary angioplasty implant and graft: Secondary | ICD-10-CM | POA: Diagnosis not present

## 2015-07-22 ENCOUNTER — Encounter (HOSPITAL_COMMUNITY)
Admission: RE | Admit: 2015-07-22 | Discharge: 2015-07-22 | Disposition: A | Discharge status: Home / Self Care | Payer: Medicare Other | Source: Ambulatory Visit | Attending: Internal Medicine | Admitting: Internal Medicine

## 2015-07-22 ENCOUNTER — Encounter (HOSPITAL_COMMUNITY): Payer: Medicare Other

## 2015-07-22 DIAGNOSIS — Z955 Presence of coronary angioplasty implant and graft: Secondary | ICD-10-CM | POA: Diagnosis not present

## 2015-07-24 ENCOUNTER — Encounter (HOSPITAL_COMMUNITY)
Admission: RE | Admit: 2015-07-24 | Discharge: 2015-07-24 | Disposition: A | Discharge status: Home / Self Care | Payer: Medicare Other | Source: Ambulatory Visit | Attending: Internal Medicine | Admitting: Internal Medicine

## 2015-07-24 ENCOUNTER — Telehealth: Payer: Self-pay | Admitting: Internal Medicine

## 2015-07-24 ENCOUNTER — Encounter (HOSPITAL_COMMUNITY): Payer: Medicare Other

## 2015-07-24 DIAGNOSIS — Z955 Presence of coronary angioplasty implant and graft: Secondary | ICD-10-CM | POA: Diagnosis not present

## 2015-07-24 NOTE — Telephone Encounter (Signed)
Returned call to [facility] left message on personal voice mail Dr.Hilty advised schedule appointment with Rodney Palau NP with AFib Clinic.  Spoke to patient appointment scheduled with Rodney Palau NP 07/27/15 at 8:30 am.Patient requested to be seen today if possible.Stated he has never been in AFib this long.Stated has been in AFib since 9:00 am this morning.  AFib clinic called back left message on personal voice mail pt wants to be seen today.

## 2015-07-24 NOTE — Telephone Encounter (Signed)
Returned call to patient AFib clinic does not schedule appointments on Friday afternoons.Patient reassured as long as heart rate is controlled ok to wait until appointment on Mon 8/15.Advised to take medications as prescribed and keep appointment as planned.Advised to go to ER over the weekend if heart rate becomes too fast.

## 2015-07-24 NOTE — Telephone Encounter (Signed)
Rodney [name] from Cardiac Rehab

## 2015-07-24 NOTE — Progress Notes (Signed)
Rodney [name] was noted to be in atrial fibrillation today during exercise at a controlled rate . Resting heart rate in the 90's maximum exertional heart rate 123. Patient asymptomatic and denies feeling light headed or dizzy. Rodney [name] does report that he often feels like a zombie for several hours every morning and then feels more awake as the day goes on. Dr [facility] office called and notified of today's rhythm and of Rodney [name]'s reports of feeling fatigued. Will fax exercise flow sheets to Dr. Lysbeth Penner office for review spoke with triage nurse Rodney [name]. Exit blood pressure 104/60. Will continue to monitor the patient throughout  the program.

## 2015-07-24 NOTE — Telephone Encounter (Signed)
Received a call from [facility] with Cardiac Rehab.She faxed over B/P readings and EKG strip for Dr.Hilty to review.Pt in Afib entire class this morning.Pt feels like a "zombie".Advised I will give B/P readings and EKG strip to Dr.Hilty for review.

## 2015-07-25 ENCOUNTER — Encounter (HOSPITAL_COMMUNITY): Payer: Self-pay | Admitting: Emergency Medicine

## 2015-07-25 DIAGNOSIS — I480 Paroxysmal atrial fibrillation: Secondary | ICD-10-CM | POA: Insufficient documentation

## 2015-07-25 DIAGNOSIS — I10 Essential (primary) hypertension: Secondary | ICD-10-CM | POA: Insufficient documentation

## 2015-07-25 DIAGNOSIS — M1990 Unspecified osteoarthritis, unspecified site: Secondary | ICD-10-CM | POA: Diagnosis not present

## 2015-07-25 DIAGNOSIS — Z7982 Long term (current) use of aspirin: Secondary | ICD-10-CM | POA: Insufficient documentation

## 2015-07-25 DIAGNOSIS — Z9889 Other specified postprocedural states: Secondary | ICD-10-CM | POA: Diagnosis not present

## 2015-07-25 DIAGNOSIS — E785 Hyperlipidemia, unspecified: Secondary | ICD-10-CM | POA: Insufficient documentation

## 2015-07-25 DIAGNOSIS — I2510 Atherosclerotic heart disease of native coronary artery without angina pectoris: Secondary | ICD-10-CM | POA: Insufficient documentation

## 2015-07-25 DIAGNOSIS — N289 Disorder of kidney and ureter, unspecified: Secondary | ICD-10-CM | POA: Insufficient documentation

## 2015-07-25 DIAGNOSIS — Z79899 Other long term (current) drug therapy: Secondary | ICD-10-CM | POA: Insufficient documentation

## 2015-07-25 DIAGNOSIS — D696 Thrombocytopenia, unspecified: Secondary | ICD-10-CM | POA: Diagnosis not present

## 2015-07-25 DIAGNOSIS — Z87891 Personal history of nicotine dependence: Secondary | ICD-10-CM | POA: Diagnosis not present

## 2015-07-25 DIAGNOSIS — R002 Palpitations: Secondary | ICD-10-CM | POA: Diagnosis present

## 2015-07-25 NOTE — ED Notes (Signed)
Patient here with complaint of palpitations. States he is a cardia rehab patient and he was at rehab this past when he felt palpitations. At that time MD cleared patient and advised him if sensation returned to come to ED for evaluation. Denies chest pain and SOB.

## 2015-07-26 ENCOUNTER — Emergency Department (HOSPITAL_COMMUNITY)
Admission: EM | Admit: 2015-07-26 | Discharge: 2015-07-26 | Disposition: A | Discharge status: Home / Self Care | Payer: Medicare Other | Attending: Emergency Medicine | Admitting: Emergency Medicine

## 2015-07-26 DIAGNOSIS — N289 Disorder of kidney and ureter, unspecified: Secondary | ICD-10-CM

## 2015-07-26 DIAGNOSIS — I480 Paroxysmal atrial fibrillation: Secondary | ICD-10-CM

## 2015-07-26 DIAGNOSIS — D696 Thrombocytopenia, unspecified: Secondary | ICD-10-CM

## 2015-07-26 LAB — CBC
HEMATOCRIT: 42.6 % (ref 39.0–52.0)
HEMOGLOBIN: 14.7 g/dL (ref 13.0–17.0)
MCH: 31.2 pg (ref 26.0–34.0)
MCHC: 34.5 g/dL (ref 30.0–36.0)
MCV: 90.4 fL (ref 78.0–100.0)
PLATELETS: 133 10*3/uL — AB (ref 150–400)
RBC: 4.71 MIL/uL (ref 4.22–5.81)
RDW: 13.8 % (ref 11.5–15.5)
WBC: 3.9 10*3/uL — ABNORMAL LOW (ref 4.0–10.5)

## 2015-07-26 LAB — BASIC METABOLIC PANEL
Anion gap: 11 (ref 5–15)
BUN: 12 mg/dL (ref 6–20)
CALCIUM: 9.3 mg/dL (ref 8.9–10.3)
CHLORIDE: 107 mmol/L (ref 101–111)
CO2: 20 mmol/L — ABNORMAL LOW (ref 22–32)
CREATININE: 1.36 mg/dL — AB (ref 0.61–1.24)
GFR calc Af Amer: 58 mL/min — ABNORMAL LOW (ref >60)
GFR calc non Af Amer: 50 mL/min — ABNORMAL LOW (ref >60)
Glucose, Bld: 108 mg/dL — ABNORMAL HIGH (ref 65–99)
POTASSIUM: 3.7 mmol/L (ref 3.5–5.1)
SODIUM: 138 mmol/L (ref 135–145)

## 2015-07-26 NOTE — Discharge Instructions (Signed)

## 2015-07-26 NOTE — ED Notes (Signed)
Pt. Left with all belongings and refused wheelchair. Discharge instructions were reviewed and all questions were answered.  

## 2015-07-26 NOTE — ED Provider Notes (Signed)
CSN: [id number]     Arrival date & time 07/25/15  2318 History  This chart was scribed for Delora Fuel, MD by Ludger Nutting, ED Scribe. This patient was seen in room B15C/B15C and the patient's care was started 3:31 AM.    Chief Complaint  Patient presents with  . Atrial Fibrillation    The history is provided by the patient. No language interpreter was used.     HPI Comments: Rodney [name] is a 74 y.o. male with past medical history of paroxysmal atrial fibrillation, HTN, HLD, CAD who presents to the Emergency Department complaining of palpitations that began a few hours ago. Patient states he was checking his pulse when he noticed it was racing. He reports currently taking Eliquis. He is a cardiac rehab patient who had an episode of palpitations earlier this week. He was cleared by Dr. Debara Pickett at that time and was advised to come to the ED if similar symptoms occurred. He denies chest pain, SOB.    Past Medical History  Diagnosis Date  . Hypertension   . Hyperlipidemia   . Gout   . CAD (coronary artery disease)     a. cath 2010 b. CAD s/p PCI to ramus and LCX on 03/04/15  . AAA (abdominal aortic aneurysm)     stent graft put in 02/2009  . Arthritis   . Paroxysmal atrial fibrillation   . Tobacco abuse    Past Surgical History  Procedure Laterality Date  . Abdominal aortic aneurysm repair  02/19/2009    performed by [facility]  . Colonoscopy    . Knee arthroscopy  2003    left  . Shoulder arthroscopy with rotator cuff repair and subacromial decompression Left 11/19/2013    Procedure: LEFT SHOULDER ARTHROSCOPY DEBRIDEMENT EXTENTSIVE DISTAL CLAVICULECTOMY DECOMPRESSION PARTIAL ACROMIOPLASTY WITH CORACOACROMIAL WITH ROTATOR CUFF REPAIR ;  Surgeon: Renette Butters, MD;  Location: [place];  Service: Orthopedics;  Laterality: Left;  . Left and right heart catheterization with coronary angiogram N/A 03/03/2015    Procedure: LEFT AND RIGHT HEART CATHETERIZATION WITH CORONARY  ANGIOGRAM;  Surgeon: Troy Sine, MD;    . Percutaneous coronary stent intervention (pci-s) N/A 03/04/2015    Procedure: PERCUTANEOUS CORONARY STENT INTERVENTION (PCI-S);  Surgeon: Troy Sine, MD; RI 99>>0% w/   BMS, bifurcation CFX-OM 90>>40% w/ Angiosculpt PTCA; dCFX 90>>0% w/   2.7520 mm Rebel BMS   Family History  Problem Relation Age of Onset  . Diabetes Sister   . Hypertension Sister   . Cancer Father     prostate  . Hypertension Mother   . Stroke Neg Hx   . Heart attack Neg Hx    Social History  Substance Use Topics  . Smoking status: Former Smoker -- 0.50 packs/day for 50 years    Types: Cigarettes  . Smokeless tobacco: Never Used  . Alcohol Use: 4.2 - 4.8 oz/week    6 Cans of beer, 1-2 Shots of liquor per week     Comment: couple shots of royal per day    Review of Systems  Respiratory: Negative for shortness of breath.   Cardiovascular: Positive for palpitations. Negative for chest pain.  All other systems reviewed and are negative.     Allergies  Iohexol and Lisinopril  Home Medications   Prior to Admission medications   Medication Sig Start Date End Date Taking? Authorizing Provider  amLODipine (NORVASC) 10 MG tablet Take 1 tablet (10 mg total) by mouth daily. 05/26/15  [name], MD  apixaban (ELIQUIS) 5 MG TABS tablet Take 1 tablet (5 mg total) by mouth 2 (two) times daily. 05/26/15   [name], MD  aspirin EC 81 MG tablet Take 81 mg by mouth daily.    Historical Provider, MD  atorvastatin (LIPITOR) 80 MG tablet Take 1 tablet (80 mg total) by mouth daily. 05/21/15   [name], MD  carvedilol (COREG) 6.25 MG tablet Take 1 tablet (6.25 mg total) by mouth 2 (two) times daily with a meal. 03/20/15   Brittainy Erie Noe, PA-C  dofetilide (TIKOSYN) 250 MCG capsule Take 1 capsule (250 mcg total) by mouth 2 (two) times daily. 03/20/15   Brittainy Erie Noe, PA-C  famotidine (PEPCID) 40 MG tablet Take 1 tablet (40 mg total) by mouth 2 (two) times  daily. 04/21/15   [name], MD  fish oil-omega-3 fatty acids 1000 MG capsule Take 1 g by mouth daily.     Historical Provider, MD  furosemide (LASIX) 20 MG tablet Take 1 tablet (20 mg total) by mouth daily. 01/05/15   Robbie Lis, MD  irbesartan (AVAPRO) 300 MG tablet Take 1 tablet (300 mg total) by mouth daily. 03/09/15   Brett Canales, PA-C  isosorbide mononitrate (IMDUR) 60 MG 24 hr tablet Take 1 tablet (60 mg total) by mouth daily. 03/06/15   Rhonda G Barrett, PA-C  loratadine (CLARITIN) 10 MG tablet Take 10 mg by mouth daily as needed for allergies.    Historical Provider, MD  Multiple Vitamin (MULTIVITAMIN WITH MINERALS) TABS tablet Take 1 tablet by mouth daily.    Historical Provider, MD  nicotine (NICODERM CQ - DOSED IN MG/24 HOURS) 14 mg/24hr patch Place 14 mg onto the skin daily.    Historical Provider, MD  nitroGLYCERIN (NITROSTAT) 0.4 MG SL tablet Place 1 tablet (0.4 mg total) under the tongue every 5 (five) minutes as needed for chest pain (CP or SOB). 03/09/15   Brett Canales, PA-C  potassium chloride (K-DUR) 10 MEQ tablet Take 1 tablet (10 mEq total) by mouth daily. 01/05/15   Robbie Lis, MD   BP 128/82 mmHg  Pulse 54  Temp(Src) 98.7 F (37.1 C) (Oral)  Resp 18  Ht 6\' 2"  (1.88 m)  Wt 224 lb 7 oz (101.804 kg)  BMI 28.80 kg/m2  SpO2 99% Physical Exam  Constitutional: He is oriented to person, place, and time. He appears well-developed and well-nourished.  HENT:  Head: Normocephalic and atraumatic.  Eyes: Pupils are equal, round, and reactive to light.  Neck: Normal range of motion. Neck supple. No JVD present.  Cardiovascular: Normal rate, regular rhythm and normal heart sounds.   No murmur heard. Pulmonary/Chest: Effort normal and breath sounds normal. He has no wheezes. He has no rales. He exhibits no tenderness.  Abdominal: Soft. Bowel sounds are normal. He exhibits no distension and no mass. There is no tenderness.  Musculoskeletal: Normal range of motion. He  exhibits no edema.  Lymphadenopathy:    He has no cervical adenopathy.  Neurological: He is alert and oriented to person, place, and time. No cranial nerve deficit. Coordination normal.  Skin: Skin is warm and dry. No rash noted.  Psychiatric: He has a normal mood and affect. His behavior is normal. Judgment and thought content normal.  Nursing note and vitals reviewed.   ED Course  Procedures (including critical care time)  DIAGNOSTIC STUDIES: Oxygen Saturation is 99% on RA, normal by my interpretation.    COORDINATION  OF CARE: 3:37 AM Discussed treatment plan with pt at bedside and pt agreed to plan.   Labs Review Labs Reviewed  BASIC METABOLIC PANEL - Abnormal; Notable for the following:    CO2 20 (*)    Glucose, Bld 108 (*)    Creatinine, Ser 1.36 (*)    GFR calc non Af Amer 50 (*)    GFR calc Af Amer 58 (*)    All other components within normal limits  CBC - Abnormal; Notable for the following:    WBC 3.9 (*)    Platelets 133 (*)    All other components within normal limits    Imaging Review No results found. I, Delora Fuel, MD, personally reviewed and evaluated these images and lab results as part of my medical decision-making.   EKG Interpretation   Date/Time:  Sunday July 26 2015 03:01:32 EDT Ventricular Rate:  55 PR Interval:  187 QRS Duration: 103 QT Interval:  487 QTC Calculation: 466 R Axis:   3 Text Interpretation:  Sinus rhythm Abnormal R-wave progression, early  transition Abnormal T, consider ischemia, diffuse leads Baseline wander in  lead(s) V2 When compared with ECG of 04/28/2015, No significant change was  found Confirmed by [facility]  MD, Luverna Degenhart ([id number]) on 07/26/2015 3:28:55 AM      ECG at 2328 on 07/25/2015: Atrial fibrillation 97/minute Normal axis Normal intervals Nonspecific ST-T changes Compared with ECG 04/28/2015, atrial fibrillation and nonspecific ST-T changes are new MDM   Final diagnoses:  Paroxysmal atrial fibrillation   Thrombocytopenia  Renal insufficiency    Paroxysmal atrial fibrillation. Patient is back in sinus rhythm. He is already anticoagulated on apixaban, and is already taking antiarrhythmics. He has an appointment with his cardiologist tomorrow. He is to keep that appointment. No need for acute intervention. His episode of atrial fibrillation did have a controlled ventricular response.  I personally performed the services described in this documentation, which was scribed in my presence. The recorded information has been reviewed and is accurate.     Delora Fuel, MD 75/17/00 1749

## 2015-07-27 ENCOUNTER — Other Ambulatory Visit: Payer: Self-pay | Admitting: Vascular Surgery

## 2015-07-27 ENCOUNTER — Encounter (HOSPITAL_COMMUNITY): Payer: Self-pay | Admitting: Nurse Practitioner

## 2015-07-27 ENCOUNTER — Ambulatory Visit (HOSPITAL_COMMUNITY)
Admission: RE | Admit: 2015-07-27 | Discharge: 2015-07-27 | Disposition: A | Discharge status: Home / Self Care | Payer: No Typology Code available for payment source | Source: Ambulatory Visit | Attending: Nurse Practitioner | Admitting: Nurse Practitioner

## 2015-07-27 ENCOUNTER — Encounter (HOSPITAL_COMMUNITY)
Admission: RE | Admit: 2015-07-27 | Discharge: 2015-07-27 | Disposition: A | Discharge status: Home / Self Care | Payer: Medicare Other | Source: Ambulatory Visit | Attending: Internal Medicine | Admitting: Internal Medicine

## 2015-07-27 ENCOUNTER — Telehealth (HOSPITAL_COMMUNITY): Payer: Self-pay | Admitting: *Deleted

## 2015-07-27 ENCOUNTER — Encounter (HOSPITAL_BASED_OUTPATIENT_CLINIC_OR_DEPARTMENT_OTHER): Payer: Self-pay | Admitting: *Deleted

## 2015-07-27 ENCOUNTER — Other Ambulatory Visit: Payer: Self-pay | Admitting: *Deleted

## 2015-07-27 ENCOUNTER — Encounter: Payer: Self-pay | Admitting: Vascular Surgery

## 2015-07-27 ENCOUNTER — Encounter (HOSPITAL_COMMUNITY): Payer: Medicare Other

## 2015-07-27 DIAGNOSIS — I480 Paroxysmal atrial fibrillation: Secondary | ICD-10-CM | POA: Diagnosis present

## 2015-07-27 DIAGNOSIS — I714 Abdominal aortic aneurysm, without rupture, unspecified: Secondary | ICD-10-CM

## 2015-07-27 DIAGNOSIS — Z48812 Encounter for surgical aftercare following surgery on the circulatory system: Secondary | ICD-10-CM

## 2015-07-27 DIAGNOSIS — Z955 Presence of coronary angioplasty implant and graft: Secondary | ICD-10-CM | POA: Diagnosis not present

## 2015-07-27 DIAGNOSIS — G4733 Obstructive sleep apnea (adult) (pediatric): Secondary | ICD-10-CM

## 2015-07-27 MED ORDER — DILTIAZEM HCL 30 MG PO TABS: ORAL_TABLET | ORAL | Status: DC

## 2015-07-27 NOTE — Telephone Encounter (Signed)
Per Roderic Palau, NP patient may resume cardiac rehab today 07/27/2015

## 2015-07-27 NOTE — Progress Notes (Signed)
Patient returned to exercise this morning per the atrial fib clinic and exercised without difficulty this morning. Patient remained in Sinus Rhythm. Vital signs stable.

## 2015-07-27 NOTE — Progress Notes (Signed)
Patient ID: Rodney [name], male   DOB: 1941-06-04, 74 y.o.   MRN: [id number]     Primary Care Physician: [name], MD Referring Physician: Dr. Debara Pickett Cardiologist: Dr. Debarah Crape Rodney [name] is a 74 y.o. male with a h/o PAF on Tikosyn 250 mg bid, CAD s/p stenting of bifurcation stenosis involving the circumflex, AAA s/p stent repair, HTN, HLD, that is referred to the afib clinic for evaluation of afib seen in cardiac rehab. It is rate controlled when he is in afib. He is asymptomatic in afib and other than seeing an irregular pulse when he checks his BP, would not be aware. He was noted in be in afib and an appointment was made in the afib clinic for this am. The [facility] triage nurse told him if he should go in afib over the weekend, he should go to the ER. He noticed irregular heartbeat late Friday evening and proceeded to the ER. He converted to SR while there and d/c home without change in meds.   Review of lifestyle reveals that pt is drinking 2 mixed drinks containing crown royal a night and is positive for snoring and apnea as witnessed by the wife. He has minimal caffeine use and is on the nicotine patch and is weaning down on the dose and has been successful to not revert to smoking. He is exercising regularly in cardiac rehab and is not overweight. He is compliant with eliquis and tikosyn. Chadsvasc score is at least 5.  Today, he denies symptoms of palpitations, chest pain, shortness of breath, orthopnea, PND, lower extremity edema, dizziness, presyncope, syncope, or neurologic sequela. The patient is tolerating medications without difficulties and is otherwise without complaint today.   Past Medical History  Diagnosis Date  . Hypertension   . Hyperlipidemia   . Gout   . CAD (coronary artery disease)     a. cath 2010 b. CAD s/p PCI to ramus and LCX on 03/04/15  . AAA (abdominal aortic aneurysm)     stent graft put in 02/2009  . Arthritis   . Paroxysmal atrial  fibrillation   . Tobacco abuse    Past Surgical History  Procedure Laterality Date  . Abdominal aortic aneurysm repair  02/19/2009    performed by [facility]  . Colonoscopy    . Knee arthroscopy  2003    left  . Shoulder arthroscopy with rotator cuff repair and subacromial decompression Left 11/19/2013    Procedure: LEFT SHOULDER ARTHROSCOPY DEBRIDEMENT EXTENTSIVE DISTAL CLAVICULECTOMY DECOMPRESSION PARTIAL ACROMIOPLASTY WITH CORACOACROMIAL WITH ROTATOR CUFF REPAIR ;  Surgeon: Renette Butters, MD;  Location: [facility];  Service: Orthopedics;  Laterality: Left;  . Left and right heart catheterization with coronary angiogram N/A 03/03/2015    Procedure: LEFT AND RIGHT HEART CATHETERIZATION WITH CORONARY ANGIOGRAM;  Surgeon: Troy Sine, MD;    . Percutaneous coronary stent intervention (pci-s) N/A 03/04/2015    Procedure: PERCUTANEOUS CORONARY STENT INTERVENTION (PCI-S);  Surgeon: Troy Sine, MD; RI 99>>0% w/   BMS, bifurcation CFX-OM 90>>40% w/ Angiosculpt PTCA; dCFX 90>>0% w/   2.7520 mm Rebel BMS    Current Outpatient Prescriptions  Medication Sig Dispense Refill  . amLODipine (NORVASC) 10 MG tablet Take 1 tablet (10 mg total) by mouth daily. 30 tablet 3  . apixaban (ELIQUIS) 5 MG TABS tablet Take 1 tablet (5 mg total) by mouth 2 (two) times daily. 180 tablet 3  . aspirin EC 81 MG tablet Take 81 mg by  mouth daily.    Marland Kitchen atorvastatin (LIPITOR) 80 MG tablet Take 1 tablet (80 mg total) by mouth daily. 90 tablet 3  . carvedilol (COREG) 6.25 MG tablet Take 1 tablet (6.25 mg total) by mouth 2 (two) times daily with a meal. 60 tablet 5  . dofetilide (TIKOSYN) 250 MCG capsule Take 1 capsule (250 mcg total) by mouth 2 (two) times daily. 60 capsule 5  . famotidine (PEPCID) 40 MG tablet Take 1 tablet (40 mg total) by mouth 2 (two) times daily. 180 tablet 1  . fish oil-omega-3 fatty acids 1000 MG capsule Take 1 g by mouth daily.     . furosemide (LASIX) 20 MG tablet Take 1 tablet (20 mg  total) by mouth daily. 30 tablet 0  . irbesartan (AVAPRO) 300 MG tablet Take 1 tablet (300 mg total) by mouth daily. 30 tablet 11  . isosorbide mononitrate (IMDUR) 60 MG 24 hr tablet Take 1 tablet (60 mg total) by mouth daily. 30 tablet 11  . loratadine (CLARITIN) 10 MG tablet Take 10 mg by mouth daily as needed for allergies.    . Multiple Vitamin (MULTIVITAMIN WITH MINERALS) TABS tablet Take 1 tablet by mouth daily.    . nicotine (NICODERM CQ - DOSED IN MG/24 HOURS) 14 mg/24hr patch Place 14 mg onto the skin daily.    . potassium chloride (K-DUR) 10 MEQ tablet Take 1 tablet (10 mEq total) by mouth daily. 30 tablet 0  . diltiazem (CARDIZEM) 30 MG tablet Take 1 tablet every 4 hours AS NEEDED for irregular HR>100 and BP>100 30 tablet 1  . nitroGLYCERIN (NITROSTAT) 0.4 MG SL tablet Place 1 tablet (0.4 mg total) under the tongue every 5 (five) minutes as needed for chest pain (CP or SOB). 25 tablet 12   No current facility-administered medications for this encounter.    Allergies  Allergen Reactions  . Iohexol      Code: RASH, Desc: PAITENT STATED THAT HE BEGAN ITCHING TOWARDS END OF INJECTIN OF IV CONTRAST-- 13 HR PREP RECOMMENDED/MMS   . Lisinopril Cough    Social History   Social History  . Marital Status: Married    Spouse Name: N/A  . Number of Children: N/A  . Years of Education: N/A   Occupational History  . Not on file.   Social History Main Topics  . Smoking status: Former Smoker -- 0.50 packs/day for 50 years    Types: Cigarettes  . Smokeless tobacco: Never Used  . Alcohol Use: 4.2 - 4.8 oz/week    6 Cans of beer, 1-2 Shots of liquor per week     Comment: couple shots of royal per day  . Drug Use: No  . Sexual Activity: Not Currently     Comment: cutting down 1/2 ppd   Other Topics Concern  . Not on file   Social History Narrative   Lives with wife, does not use assist, drives.  No home health services.      Family History  Problem Relation Age of Onset  .  Diabetes Sister   . Hypertension Sister   . Cancer Father     prostate  . Hypertension Mother   . Stroke Neg Hx   . Heart attack Neg Hx     ROS- All systems are reviewed and negative except as per the HPI above  Physical Exam: Filed Vitals:   07/27/15 0837  BP: 142/90  Pulse: 63  Height: 6\' 2"  (1.88 m)  Weight: 218 lb 6.4 oz (99.066  kg)    GEN- The patient is well appearing, alert and oriented x 3 today.   Head- normocephalic, atraumatic Eyes-  Sclera clear, conjunctiva pink Ears- hearing intact Oropharynx- clear Neck- supple, no JVP Lymph- no cervical lymphadenopathy Lungs- Clear to ausculation bilaterally, normal work of breathing Heart- Regular rate and rhythm, no murmurs, rubs or gallops, PMI not laterally displaced GI- soft, NT, ND, + BS Extremities- no clubbing, cyanosis, or edema MS- no significant deformity or atrophy Skin- no rash or lesion Psych- euthymic mood, full affect Neuro- strength and sensation are intact  EKG-Sinus brady, 59 bpm, Pr int 194 ms, QRS 84 ms, QTc 403 ms. Echo-Left ventricle: The cavity size was normal. Wall thickness was increased in a pattern of moderate LVH. Systolic function was moderately reduced. The estimated ejection fraction was in the range of 35% to 40%. Diffuse hypokinesis. There is mild hypokinesis of the mid-apicalanterolateral myocardium. Doppler parameters are consistent with abnormal left ventricular relaxation (grade 1 diastolic dysfunction). Doppler parameters are consistent with high ventricular filling pressure. - Mitral valve: There was mild regurgitation.  Impressions:  - Global L. Strain -13.4. Left atrium normal in size 31 mm.  Epic records reviewed.   Assessment and Plan: 1. PAF Some recent breakthrough  afib from which is asymptomatic and is rate controlled when he is in afib. Continue tikosyn, carvedilol and eliquis. If in future should fail Tikosyn, may be a good candidate for ablation  given normal left atrial size.  2. Lifestyle factors contributing to afib burden Limit alcohol use to no more than 2 drinks a week Continue with smoking cessation efforts, weaning off nicotine patch. Sleep study for symptoms suggesting sleep apnea. He was educated on when to report to the ER and when to call the office for issues with afib Continue exercise efforts thru cardiac rehab.  F/u in one month.  Geroge Baseman Carroll, [facility] [address] [place], [place] [address] [phone]

## 2015-07-27 NOTE — Patient Instructions (Signed)
Your physician has recommended you make the following change in your medication:  1)Cardizem 30mg  -- take 1 tablet every 4 hours AS NEEDED for irregular heart rate >100 as long as blood pressure >100  Scheduler will be in touch with you regarding sleep study  Parking garage code [phone]

## 2015-07-28 ENCOUNTER — Telehealth (HOSPITAL_COMMUNITY): Payer: Self-pay | Admitting: Cardiac Rehabilitation

## 2015-07-28 ENCOUNTER — Ambulatory Visit (INDEPENDENT_AMBULATORY_CARE_PROVIDER_SITE_OTHER): Payer: Medicare Other | Admitting: Vascular Surgery

## 2015-07-28 ENCOUNTER — Ambulatory Visit (HOSPITAL_COMMUNITY)
Admission: RE | Admit: 2015-07-28 | Discharge: 2015-07-28 | Disposition: A | Discharge status: Home / Self Care | Payer: Medicare Other | Source: Ambulatory Visit | Attending: Vascular Surgery | Admitting: Vascular Surgery

## 2015-07-28 ENCOUNTER — Encounter: Payer: Self-pay | Admitting: Vascular Surgery

## 2015-07-28 VITALS — BP 123/76 | HR 57 | Ht 74.0 in | Wt 220.0 lb

## 2015-07-28 DIAGNOSIS — Z48812 Encounter for surgical aftercare following surgery on the circulatory system: Secondary | ICD-10-CM | POA: Insufficient documentation

## 2015-07-28 DIAGNOSIS — I714 Abdominal aortic aneurysm, without rupture, unspecified: Secondary | ICD-10-CM

## 2015-07-28 DIAGNOSIS — IMO0001 Reserved for inherently not codable concepts without codable children: Secondary | ICD-10-CM

## 2015-07-28 DIAGNOSIS — T82330S Leakage of aortic (bifurcation) graft (replacement), sequela: Secondary | ICD-10-CM | POA: Diagnosis not present

## 2015-07-28 NOTE — Telephone Encounter (Signed)
(  late entry for 07/22/15) pt made aware of conversations with [facility] [employer] representative about [employer] authorization for cardiac rehab coverage.  Pt verbalized understanding that current authorization does not include 07/06/15 visit but request has been submitted to include this date.  Pt verbalized understanding.

## 2015-07-28 NOTE — Progress Notes (Signed)
Subjective:     Patient ID: Rodney [name], male   DOB: [birth year], 74 y.o.   MRN: [id number]  HPI this 74 year old male is seen for continued follow-up regarding his abdominal aortic aneurysm stent graft repair. He denies any abdominal or back symptoms. He did have some cardiac stents placed earlier this year by Dr. Claiborne Billings and is doing well from that standpoint. He is currently on aspirin and Eloquis  Past Medical History  Diagnosis Date  . Hypertension   . Hyperlipidemia   . Gout   . CAD (coronary artery disease)     a. cath 2010 b. CAD s/p PCI to ramus and LCX on 03/04/15  . AAA (abdominal aortic aneurysm)     stent graft put in 02/2009  . Arthritis   . Paroxysmal atrial fibrillation   . Tobacco abuse     Social History  Substance Use Topics  . Smoking status: Former Smoker -- 0.50 packs/day for 50 years    Types: Cigarettes  . Smokeless tobacco: Never Used  . Alcohol Use: 4.2 - 4.8 oz/week    6 Cans of beer, 1-2 Shots of liquor per week     Comment: couple shots of royal per day    Family History  Problem Relation Age of Onset  . Diabetes Sister   . Hypertension Sister   . Cancer Father     prostate  . Hypertension Mother   . Stroke Neg Hx   . Heart attack Neg Hx     Allergies  Allergen Reactions  . Iohexol      Code: RASH, Desc: PAITENT STATED THAT HE BEGAN ITCHING TOWARDS END OF INJECTIN OF IV CONTRAST-- 13 HR PREP RECOMMENDED/MMS   . Lisinopril Cough     Current outpatient prescriptions:  .  amLODipine (NORVASC) 10 MG tablet, Take 1 tablet (10 mg total) by mouth daily., Disp: 30 tablet, Rfl: 3 .  apixaban (ELIQUIS) 5 MG TABS tablet, Take 1 tablet (5 mg total) by mouth 2 (two) times daily., Disp: 180 tablet, Rfl: 3 .  aspirin EC 81 MG tablet, Take 81 mg by mouth daily., Disp: , Rfl:  .  atorvastatin (LIPITOR) 80 MG tablet, Take 1 tablet (80 mg total) by mouth daily., Disp: 90 tablet, Rfl: 3 .  carvedilol (COREG) 6.25 MG tablet, Take 1 tablet (6.25 mg total) by  mouth 2 (two) times daily with a meal., Disp: 60 tablet, Rfl: 5 .  diltiazem (CARDIZEM) 30 MG tablet, Take 1 tablet every 4 hours AS NEEDED for irregular HR>100 and BP>100, Disp: 30 tablet, Rfl: 1 .  dofetilide (TIKOSYN) 250 MCG capsule, Take 1 capsule (250 mcg total) by mouth 2 (two) times daily., Disp: 60 capsule, Rfl: 5 .  famotidine (PEPCID) 40 MG tablet, Take 1 tablet (40 mg total) by mouth 2 (two) times daily., Disp: 180 tablet, Rfl: 1 .  fish oil-omega-3 fatty acids 1000 MG capsule, Take 1 g by mouth daily. , Disp: , Rfl:  .  furosemide (LASIX) 20 MG tablet, Take 1 tablet (20 mg total) by mouth daily., Disp: 30 tablet, Rfl: 0 .  irbesartan (AVAPRO) 300 MG tablet, Take 1 tablet (300 mg total) by mouth daily., Disp: 30 tablet, Rfl: 11 .  isosorbide mononitrate (IMDUR) 60 MG 24 hr tablet, Take 1 tablet (60 mg total) by mouth daily., Disp: 30 tablet, Rfl: 11 .  loratadine (CLARITIN) 10 MG tablet, Take 10 mg by mouth daily as needed for allergies., Disp: , Rfl:  .  Multiple  Vitamin (MULTIVITAMIN WITH MINERALS) TABS tablet, Take 1 tablet by mouth daily., Disp: , Rfl:  .  nicotine (NICODERM CQ - DOSED IN MG/24 HOURS) 14 mg/24hr patch, Place 7 mg onto the skin daily. , Disp: , Rfl:  .  nitroGLYCERIN (NITROSTAT) 0.4 MG SL tablet, Place 1 tablet (0.4 mg total) under the tongue every 5 (five) minutes as needed for chest pain (CP or SOB)., Disp: 25 tablet, Rfl: 12 .  potassium chloride (K-DUR) 10 MEQ tablet, Take 1 tablet (10 mEq total) by mouth daily., Disp: 30 tablet, Rfl: 0  Filed Vitals:   07/28/15 0934  BP: 123/76  Pulse: 57  Height: 6\' 2"  (1.88 m)  Weight: 220 lb (99.791 kg)  SpO2: 100%    Body mass index is 28.23 kg/(m^2).           Review of Systems denies chest pain, dyspnea on exertion, PND, orthopnea, claudication, hemoptysis. Has history of hypertension and hyperlipidemia. He has history of coronary artery disease with cardiac stents earlier this year. Other systems negative  and complete review of systems    Objective:   Physical Exam BP 123/76 mmHg  Pulse 57  Ht 6\' 2"  (1.88 m)  Wt 220 lb (99.791 kg)  BMI 28.23 kg/m2  SpO2 100%  Gen.-alert and oriented x3 in no apparent distress HEENT normal for age Lungs no rhonchi or wheezing Cardiovascular regular rhythm no murmurs carotid pulses 3+ palpable no bruits audible Abdomen soft nontender no palpable masses Musculoskeletal free of  major deformities Skin clear -no rashes Neurologic normal Lower extremities 3+ femoral and dorsalis pedis pulses palpable bilaterally with no edema  Today I ordered a duplex scan of his abdominal aortic aneurysm stent graft repair which I reviewed and interpreted. Aneurysm sac continues to slowly get smaller measuring 3.4 in maximum dimension. No evidence of endoleak.       Assessment:     Doing well 6 years post abdominal aortic stent graft repair of abdominal aortic aneurysm with contraction of sac and no evidence of endoleak    Plan:       return in 1 year with duplex scan of aneurysm repair-EVAR  and see nurse practitioner

## 2015-07-28 NOTE — Telephone Encounter (Signed)
(  late entry for 07/20/2015)  On 07/15/15 pc received from [employer] [phone].  Pt has requested [facility] coverage for his cardiac rehab sessions.  Per [facility] Patient, authorization # [id number] effective 07/07/2015 for 36 visits valid for 6 months. Per [facility] Patient,  Because pt did not contact [employer] prior to his start date 07/06/2015 a request for retroactive start date should be submitted to J. Dortha Kern [phone].  Message left with J. Sandoval on 07/15/15, 07/17/15 and 07/20/15.  Message was received to contact [facility] [employer] program [phone].  07/20/15 spoke to representative, Hollice Espy, who states he sent information to the appointment team to make the change for effective dates of coverage and will request new provider packet be faxed to [phone].

## 2015-07-29 ENCOUNTER — Encounter (HOSPITAL_COMMUNITY)
Admission: RE | Admit: 2015-07-29 | Discharge: 2015-07-29 | Disposition: A | Discharge status: Home / Self Care | Payer: Medicare Other | Source: Ambulatory Visit | Attending: Internal Medicine | Admitting: Internal Medicine

## 2015-07-29 ENCOUNTER — Encounter (HOSPITAL_COMMUNITY): Payer: Medicare Other

## 2015-07-29 DIAGNOSIS — Z955 Presence of coronary angioplasty implant and graft: Secondary | ICD-10-CM | POA: Diagnosis not present

## 2015-07-29 NOTE — Addendum Note (Signed)
Addended by: Dorthula Rue L on: 07/29/2015 04:54 PM   Modules accepted: Orders

## 2015-07-31 ENCOUNTER — Encounter (HOSPITAL_COMMUNITY): Payer: Medicare Other

## 2015-08-03 ENCOUNTER — Encounter (HOSPITAL_COMMUNITY)
Admission: RE | Admit: 2015-08-03 | Discharge: 2015-08-03 | Disposition: A | Discharge status: Home / Self Care | Payer: Medicare Other | Source: Ambulatory Visit | Attending: Internal Medicine | Admitting: Internal Medicine

## 2015-08-03 ENCOUNTER — Encounter (HOSPITAL_COMMUNITY): Payer: Medicare Other

## 2015-08-03 DIAGNOSIS — Z955 Presence of coronary angioplasty implant and graft: Secondary | ICD-10-CM | POA: Diagnosis not present

## 2015-08-04 ENCOUNTER — Encounter: Payer: Self-pay | Admitting: Internal Medicine

## 2015-08-05 ENCOUNTER — Encounter (HOSPITAL_COMMUNITY)
Admission: RE | Admit: 2015-08-05 | Discharge: 2015-08-05 | Disposition: A | Discharge status: Home / Self Care | Payer: Medicare Other | Source: Ambulatory Visit | Attending: Internal Medicine | Admitting: Internal Medicine

## 2015-08-05 ENCOUNTER — Encounter (HOSPITAL_COMMUNITY): Payer: Medicare Other

## 2015-08-05 DIAGNOSIS — Z955 Presence of coronary angioplasty implant and graft: Secondary | ICD-10-CM | POA: Diagnosis not present

## 2015-08-07 ENCOUNTER — Encounter (HOSPITAL_COMMUNITY)
Admission: RE | Admit: 2015-08-07 | Discharge: 2015-08-07 | Disposition: A | Discharge status: Home / Self Care | Payer: Medicare Other | Source: Ambulatory Visit | Attending: Internal Medicine | Admitting: Internal Medicine

## 2015-08-07 ENCOUNTER — Encounter (HOSPITAL_COMMUNITY): Payer: Medicare Other

## 2015-08-07 DIAGNOSIS — Z955 Presence of coronary angioplasty implant and graft: Secondary | ICD-10-CM | POA: Diagnosis not present

## 2015-08-10 ENCOUNTER — Encounter (HOSPITAL_COMMUNITY)
Admission: RE | Admit: 2015-08-10 | Discharge: 2015-08-10 | Disposition: A | Discharge status: Home / Self Care | Payer: Medicare Other | Source: Ambulatory Visit | Attending: Internal Medicine | Admitting: Internal Medicine

## 2015-08-10 ENCOUNTER — Encounter (HOSPITAL_COMMUNITY): Payer: Medicare Other

## 2015-08-10 DIAGNOSIS — Z955 Presence of coronary angioplasty implant and graft: Secondary | ICD-10-CM | POA: Diagnosis not present

## 2015-08-12 ENCOUNTER — Encounter (HOSPITAL_COMMUNITY)
Admission: RE | Admit: 2015-08-12 | Discharge: 2015-08-12 | Disposition: A | Discharge status: Home / Self Care | Payer: Medicare Other | Source: Ambulatory Visit | Attending: Internal Medicine | Admitting: Internal Medicine

## 2015-08-12 DIAGNOSIS — Z955 Presence of coronary angioplasty implant and graft: Secondary | ICD-10-CM | POA: Diagnosis not present

## 2015-08-12 NOTE — Progress Notes (Signed)
Rodney [name] 74 y.o. male Nutrition Note Spoke with pt. Nutrition Plan and Nutrition Survey goals reviewed with pt. Pt is following Step 2 of the Therapeutic Lifestyle Changes diet. Pt wants to lose wt. Per discussion, pt has gained 12 lb since quitting tobacco. Pt states tobacco cessation is still "in progress." Pt feels wt gain has stopped. Pt wt today 99.6 kg, which is down 0.8 kg since admission. Pt encouraged to focus on tobacco cessation before focusing on wt loss. Pt with dx of CHF. Per discussion, pt does not use canned/convenience foods often. Pt rarely adds salt to food and eats out infrequently. Pt expressed understanding of the information reviewed. Pt aware of nutrition education classes offered and is unable to attend nutrition classes. Lab Results  Component Value Date   HGBA1C 5.5 02/21/2015   Nutrition Diagnosis ? Food-and nutrition-related knowledge deficit related to lack of exposure to information as related to diagnosis of: ? CVD ? Overweight related to excessive energy intake as evidenced by a BMI of 28.4  Nutrition RX/ Estimated Daily Nutrition Needs for: wt loss 1700-2200 Kcal, 45-60 gm fat, 11-15 gm sat fat, 1.7-2.2 gm trans-fat, <1500 mg sodium  Nutrition Intervention ? Pt's individual nutrition plan reviewed with pt. ? Benefits of adopting Therapeutic Lifestyle Changes discussed when Medficts reviewed. ? Pt to attend the Portion Distortion class  ? Pt given handouts for: ? Nutrition I class ? Nutrition II class ? Continue client-centered nutrition education by RD, as part of interdisciplinary care. Goal(s) ? Pt to describe the benefit of including fruits, vegetables, whole grains, and low-fat dairy products in a heart healthy meal plan. Monitor and Evaluate progress toward nutrition goal with team. Nutrition Risk: Change to Moderate Rodney [name], M.Ed, RD, LDN, CDE 08/12/2015 11:28 AM

## 2015-08-14 ENCOUNTER — Encounter (HOSPITAL_COMMUNITY)
Admission: RE | Admit: 2015-08-14 | Discharge: 2015-08-14 | Disposition: A | Discharge status: Home / Self Care | Payer: Medicare Other | Source: Ambulatory Visit | Attending: Internal Medicine | Admitting: Internal Medicine

## 2015-08-14 DIAGNOSIS — Z955 Presence of coronary angioplasty implant and graft: Secondary | ICD-10-CM | POA: Insufficient documentation

## 2015-08-19 ENCOUNTER — Encounter (HOSPITAL_COMMUNITY)
Admission: RE | Admit: 2015-08-19 | Discharge: 2015-08-19 | Disposition: A | Discharge status: Home / Self Care | Payer: Medicare Other | Source: Ambulatory Visit | Attending: Internal Medicine | Admitting: Internal Medicine

## 2015-08-19 DIAGNOSIS — Z955 Presence of coronary angioplasty implant and graft: Secondary | ICD-10-CM | POA: Diagnosis not present

## 2015-08-21 ENCOUNTER — Encounter (HOSPITAL_COMMUNITY)
Admission: RE | Admit: 2015-08-21 | Discharge: 2015-08-21 | Disposition: A | Discharge status: Home / Self Care | Payer: Medicare Other | Source: Ambulatory Visit | Attending: Internal Medicine | Admitting: Internal Medicine

## 2015-08-21 ENCOUNTER — Other Ambulatory Visit: Payer: Self-pay

## 2015-08-21 ENCOUNTER — Encounter (HOSPITAL_COMMUNITY): Payer: Self-pay | Admitting: Nurse Practitioner

## 2015-08-21 ENCOUNTER — Telehealth: Payer: Self-pay | Admitting: Internal Medicine

## 2015-08-21 ENCOUNTER — Ambulatory Visit (HOSPITAL_COMMUNITY)
Admission: RE | Admit: 2015-08-21 | Discharge: 2015-08-21 | Disposition: A | Discharge status: Home / Self Care | Payer: Medicare Other | Source: Ambulatory Visit | Attending: Nurse Practitioner | Admitting: Nurse Practitioner

## 2015-08-21 VITALS — BP 106/76 | HR 80 | Ht 74.0 in | Wt 216.6 lb

## 2015-08-21 DIAGNOSIS — I480 Paroxysmal atrial fibrillation: Secondary | ICD-10-CM

## 2015-08-21 DIAGNOSIS — Z955 Presence of coronary angioplasty implant and graft: Secondary | ICD-10-CM | POA: Diagnosis not present

## 2015-08-21 DIAGNOSIS — I4891 Unspecified atrial fibrillation: Secondary | ICD-10-CM

## 2015-08-21 NOTE — Patient Instructions (Signed)
EKG on Monday @ 9:30AM

## 2015-08-21 NOTE — Progress Notes (Signed)
Patient ID: Rodney [name], male   DOB: [birth year], 74 y.o.   MRN: [id number]     Primary Care Physician: Rodney Jacobson, MD Referring Physician: Dr. Debara [name] Cardiologist: Dr. Debarah [name] Rodney [name] is a 74 y.o. male with a h/o PAF on Tikosyn 250 mg bid, CAD s/p stenting of bifurcation stenosis involving the circumflex, AAA s/p stent repair, HTN, HLD, that was  referred to the afib clinic 8/16 for evaluation of afib seen in cardiac rehab. It is rate controlled when he is in afib. He is asymptomatic in afib and other than seeing an irregular pulse when he checks his BP, would not be aware. He was noted in be in afib and an appointment was made in the afib clinic for this am. The [facility] triage nurse told him if he should go in afib over the weekend, he should go to the ER. He noticed irregular heartbeat late Friday evening and proceeded to the ER. He converted to SR while there and d/c home without change in meds.   Review of lifestyle revealed that pt was drinking 2 mixed drinks containing crown royal a night and is positive for snoring and apnea as witnessed by the wife. He has minimal caffeine use and is on the nicotine patch and is weaning down on the dose and has been successful to not revert to smoking. He is exercising regularly in cardiac rehab and is not overweight. He is compliant with eliquis and tikosyn. Chadsvasc score is at least 5. Sleep study ordered and pending later part of this month.  He is being seen in the afib clinic again today for afib noted in cardiac rehab. Pt states he has felt great last several days and played golf yesterday without issues. He proceeded to cardiac rehab this am feeling like his usual. He was surprised that he was in afib. He was not allowed to exercise and he was pushed in a w/c to clinic.EKG showed afib at 105 bpm. He states he feels alittle moist in his palms but is only thing different.  He has cut back on alcohol but is still waning off nicotine  patch.  Today, he denies symptoms of palpitations, chest pain, shortness of breath, orthopnea, PND, lower extremity edema, dizziness, presyncope, syncope, or neurologic sequela. The patient is tolerating medications without difficulties and is otherwise without complaint today.   Past Medical History  Diagnosis Date  . Hypertension   . Hyperlipidemia   . Gout   . CAD (coronary artery disease)     a. cath 2010 b. CAD s/p PCI to ramus and LCX on 03/04/15  . AAA (abdominal aortic aneurysm)     stent graft put in 02/2009  . Arthritis   . Paroxysmal atrial fibrillation   . Tobacco abuse    Past Surgical History  Procedure Laterality Date  . Abdominal aortic aneurysm repair  02/19/2009    performed by [facility]  . Colonoscopy    . Knee arthroscopy  2003    left  . Shoulder arthroscopy with rotator cuff repair and subacromial decompression Left 11/19/2013    Procedure: LEFT SHOULDER ARTHROSCOPY DEBRIDEMENT EXTENTSIVE DISTAL CLAVICULECTOMY DECOMPRESSION PARTIAL ACROMIOPLASTY WITH CORACOACROMIAL WITH ROTATOR CUFF REPAIR ;  Surgeon: Renette Butters, MD;  Location: [facility];  Service: Orthopedics;  Laterality: Left;  . Left and right heart catheterization with coronary angiogram N/A 03/03/2015    Procedure: LEFT AND RIGHT HEART CATHETERIZATION WITH CORONARY ANGIOGRAM;  Surgeon: Troy Sine, MD;    .  Percutaneous coronary stent intervention (pci-s) N/A 03/04/2015    Procedure: PERCUTANEOUS CORONARY STENT INTERVENTION (PCI-S);  Surgeon: Troy Sine, MD; RI 99>>0% w/   BMS, bifurcation CFX-OM 90>>40% w/ Angiosculpt PTCA; dCFX 90>>0% w/   2.7520 mm Rebel BMS    Current Outpatient Prescriptions  Medication Sig Dispense Refill  . amLODipine (NORVASC) 10 MG tablet Take 1 tablet (10 mg total) by mouth daily. 30 tablet 3  . apixaban (ELIQUIS) 5 MG TABS tablet Take 1 tablet (5 mg total) by mouth 2 (two) times daily. 180 tablet 3  . aspirin EC 81 MG tablet Take 81 mg by mouth daily.    Marland Kitchen  atorvastatin (LIPITOR) 80 MG tablet Take 1 tablet (80 mg total) by mouth daily. 90 tablet 3  . carvedilol (COREG) 6.25 MG tablet Take 1 tablet (6.25 mg total) by mouth 2 (two) times daily with a meal. 60 tablet 5  . diltiazem (CARDIZEM) 30 MG tablet Take 1 tablet every 4 hours AS NEEDED for irregular HR>100 and BP>100 30 tablet 1  . dofetilide (TIKOSYN) 250 MCG capsule Take 1 capsule (250 mcg total) by mouth 2 (two) times daily. 60 capsule 5  . famotidine (PEPCID) 40 MG tablet Take 1 tablet (40 mg total) by mouth 2 (two) times daily. 180 tablet 1  . fish oil-omega-3 fatty acids 1000 MG capsule Take 1 g by mouth daily.     . furosemide (LASIX) 20 MG tablet Take 1 tablet (20 mg total) by mouth daily. 30 tablet 0  . irbesartan (AVAPRO) 300 MG tablet Take 1 tablet (300 mg total) by mouth daily. 30 tablet 11  . isosorbide mononitrate (IMDUR) 60 MG 24 hr tablet Take 1 tablet (60 mg total) by mouth daily. 30 tablet 11  . loratadine (CLARITIN) 10 MG tablet Take 10 mg by mouth daily as needed for allergies.    . Multiple Vitamin (MULTIVITAMIN WITH MINERALS) TABS tablet Take 1 tablet by mouth daily.    . nicotine (NICODERM CQ - DOSED IN MG/24 HOURS) 14 mg/24hr patch Place 7 mg onto the skin daily.     . potassium chloride (K-DUR) 10 MEQ tablet Take 1 tablet (10 mEq total) by mouth daily. 30 tablet 0  . nitroGLYCERIN (NITROSTAT) 0.4 MG SL tablet Place 1 tablet (0.4 mg total) under the tongue every 5 (five) minutes as needed for chest pain (CP or SOB). (Patient not taking: Reported on 08/21/2015) 25 tablet 12   No current facility-administered medications for this encounter.    Allergies  Allergen Reactions  . Iohexol      Code: RASH, Desc: PAITENT STATED THAT HE BEGAN ITCHING TOWARDS END OF INJECTIN OF IV CONTRAST-- 13 HR PREP RECOMMENDED/MMS   . Lisinopril Cough    Social History   Social History  . Marital Status: Married    Spouse Name: N/A  . Number of Children: N/A  . Years of Education: N/A     Occupational History  . Not on file.   Social History Main Topics  . Smoking status: Former Smoker -- 0.50 packs/day for 50 years    Types: Cigarettes  . Smokeless tobacco: Never Used  . Alcohol Use: 4.2 - 4.8 oz/week    6 Cans of beer, 1-2 Shots of liquor per week     Comment: couple shots of royal per day  . Drug Use: No  . Sexual Activity: Not Currently     Comment: cutting down 1/2 ppd   Other Topics Concern  . Not on  file   Social History Narrative   Lives with wife, does not use assist, drives.  No home health services.      Family History  Problem Relation Age of Onset  . Diabetes Sister   . Hypertension Sister   . Cancer Father     prostate  . Hypertension Mother   . Stroke Neg Hx   . Heart attack Neg Hx     ROS- All systems are reviewed and negative except as per the HPI above  Physical Exam: Filed Vitals:   08/21/15 1058  BP: 106/76  Pulse: 80  Height: 6\' 2"  (1.88 m)  Weight: 216 lb 9.6 oz (98.249 kg)    GEN- The patient is well appearing, alert and oriented x 3 today.   Head- normocephalic, atraumatic Eyes-  Sclera clear, conjunctiva pink Ears- hearing intact Oropharynx- clear Neck- supple, no JVP Lymph- no cervical lymphadenopathy Lungs- Clear to ausculation bilaterally, normal work of breathing Heart- Regular rate and rhythm, no murmurs, rubs or gallops, PMI not laterally displaced GI- soft, NT, ND, + BS Extremities- no clubbing, cyanosis, or edema MS- no significant deformity or atrophy Skin- no rash or lesion Psych- euthymic mood, full affect Neuro- strength and sensation are intact  EKG-Sinus brady, 59 bpm, Pr int 194 ms, QRS 84 ms, QTc 403 ms. Echo-Left ventricle: The cavity size was normal. Wall thickness was increased in a pattern of moderate LVH. Systolic function was moderately reduced. The estimated ejection fraction was in the range of 35% to 40%. Diffuse hypokinesis. There is mild hypokinesis of the  mid-apicalanterolateral myocardium. Doppler parameters are consistent with abnormal left ventricular relaxation (grade 1 diastolic dysfunction). Doppler parameters are consistent with high ventricular filling pressure. - Mitral valve: There was mild regurgitation.  Impressions:  - Global L. Strain -13.4. Left atrium normal in size 31 mm.  Epic records reviewed. Ekg afib with v rate at 105 bpm, ST/t wave abnormality unchanged from previous. QRS int 90 ms, QTc 399 ms.   Assessment and Plan: 1.  Asymptomatic PAF Some recent breakthrough  afib from which is asymptomatic and is rate controlled when he is in afib. Continue tikosyn, carvedilol and eliquis. He has 30 mg cardizem to use if HR over 569 and systolic BP over 794. If in future should fail Tikosyn, may be a good candidate for ablation given normal left atrial size.  2. Lifestyle factors contributing to afib burden Limit alcohol use to no more than 2 drinks a week Continue with smoking cessation efforts, weaning off nicotine patch. Sleep study as scheduled for symptoms suggesting sleep apnea. He was educated on when to report to the ER and when to call the office for issues with afib Continue exercise efforts thru cardiac rehab.  F/u in on Monday prior to cardiac rehab.  Geroge Baseman Rodney [name], [facility] [address] [place], [place] [address] [phone]

## 2015-08-21 NOTE — Progress Notes (Addendum)
Patient in Atrial fibrillation this morning rate 90-125. Patient reports feeling a little clammy. Blood pressure 122/70. Oxygen saturation 99% on room air. Exercise stopped. [name] RN . Today's ECG tracings faxed to Dr [facility] office for review. Dr Percival Spanish said the patient is okay to go home and take his prn diltiazem. Dr Percival Spanish said the patient needs to make an appointment for the patient to be re evaluated at the Atrial Fibrillation clinic. Mr Rodney [name] reported feeling a little short of breath this morning. I called the Atrial fib clinic. Roderic Palau NP can see the patient this morning for evaluation. The patient was taken to the Atrial Fib clinic via wheelchair. Patient called and notified his wife about today's events. Exit blood pressure 106/73 heart rate 99. [name] reported that he continues to smoke 4-5 cigarettes a day. Smoking cessation encouraged.

## 2015-08-21 NOTE — Telephone Encounter (Signed)
Verdis Frederickson, RN will take patient to AF clinic for work in appointment

## 2015-08-21 NOTE — Telephone Encounter (Signed)
Spoke with Rodney [name] - patient is at cardiac rehab and is in AF. Resting HR 90-125bpm. O2 99%. BP 122/70. Describes being clammy.   Patient saw D. Caroll, NP on 8/15 and she Rx'ed prn diltiazem for AF - which patient does not have.   Rodney [name] will do 12-lead EKG.   She would like further instructions for what to do for patient.   Will consult DOD - Dr. Percival Spanish advised that patient should go home and take prn diltiazem.  Monitoring strips received and there is only notation of "clammy" denies lightheadedness/dizziness.   When this was communicated with Rodney Frederickson, RN she now reports patient says he is a little SOB. She states they offered supplemental O2 although sats are fine and he declined. She wants to know if he can still drive himself home. Informed her that per MD review of info, he should be fine and should follow up with Maximino Greenland, NP but that if her clinical judgment of his present condition is worrisome, she should send him to ED for eval as she is presently with him and has an in person perception and assessment of his present state.

## 2015-08-24 ENCOUNTER — Encounter (HOSPITAL_COMMUNITY)
Admission: RE | Admit: 2015-08-24 | Discharge: 2015-08-24 | Disposition: A | Discharge status: Home / Self Care | Payer: Medicare Other | Source: Ambulatory Visit | Attending: Internal Medicine | Admitting: Internal Medicine

## 2015-08-24 ENCOUNTER — Ambulatory Visit (HOSPITAL_COMMUNITY)
Admission: RE | Admit: 2015-08-24 | Discharge: 2015-08-24 | Disposition: A | Discharge status: Home / Self Care | Payer: Medicare Other | Source: Ambulatory Visit | Attending: Nurse Practitioner | Admitting: Nurse Practitioner

## 2015-08-24 DIAGNOSIS — I4891 Unspecified atrial fibrillation: Secondary | ICD-10-CM | POA: Insufficient documentation

## 2015-08-24 DIAGNOSIS — I480 Paroxysmal atrial fibrillation: Secondary | ICD-10-CM

## 2015-08-24 DIAGNOSIS — Z955 Presence of coronary angioplasty implant and graft: Secondary | ICD-10-CM | POA: Diagnosis not present

## 2015-08-24 NOTE — Patient Instructions (Signed)
Parking code 0900 

## 2015-08-24 NOTE — Progress Notes (Signed)
Patient returned to exercise at cardiac rehab. Smoking cessation information given to the patient.

## 2015-08-24 NOTE — Progress Notes (Addendum)
Patient in for repeat EKG prior to attending cardiac rehab to ensure back in NSR. Patient states he went back into NSR on Friday afternoon. Roderic Palau NP to review EKG.  Ekg shows NSR with afib returning to SR a couple hours after being seen in clinic last week.Marland Kitchen He can return to cardiac rehab. He has had a 2 breakthrough afib episodes, short lived and he is not /to minimally symptomatic. If continues to have increase in breakthrough he may have to have referral to Dr. Rayann Heman for possible ablation.

## 2015-08-26 ENCOUNTER — Encounter (HOSPITAL_COMMUNITY): Payer: Medicare Other

## 2015-08-26 ENCOUNTER — Inpatient Hospital Stay (HOSPITAL_COMMUNITY): Admission: RE | Admit: 2015-08-26 | Payer: Non-veteran care | Source: Ambulatory Visit | Admitting: Nurse Practitioner

## 2015-08-28 ENCOUNTER — Encounter (HOSPITAL_COMMUNITY)
Admission: RE | Admit: 2015-08-28 | Discharge: 2015-08-28 | Disposition: A | Discharge status: Home / Self Care | Payer: Medicare Other | Source: Ambulatory Visit | Attending: Internal Medicine | Admitting: Internal Medicine

## 2015-08-28 DIAGNOSIS — Z955 Presence of coronary angioplasty implant and graft: Secondary | ICD-10-CM | POA: Diagnosis not present

## 2015-08-31 ENCOUNTER — Encounter (HOSPITAL_COMMUNITY)
Admission: RE | Admit: 2015-08-31 | Discharge: 2015-08-31 | Disposition: A | Discharge status: Home / Self Care | Payer: Medicare Other | Source: Ambulatory Visit | Attending: Internal Medicine | Admitting: Internal Medicine

## 2015-08-31 DIAGNOSIS — Z955 Presence of coronary angioplasty implant and graft: Secondary | ICD-10-CM | POA: Diagnosis not present

## 2015-09-02 ENCOUNTER — Encounter (HOSPITAL_COMMUNITY): Payer: Medicare Other

## 2015-09-04 ENCOUNTER — Encounter (HOSPITAL_COMMUNITY)
Admission: RE | Admit: 2015-09-04 | Discharge: 2015-09-04 | Disposition: A | Discharge status: Home / Self Care | Payer: Medicare Other | Source: Ambulatory Visit | Attending: Internal Medicine | Admitting: Internal Medicine

## 2015-09-04 DIAGNOSIS — Z955 Presence of coronary angioplasty implant and graft: Secondary | ICD-10-CM | POA: Diagnosis not present

## 2015-09-06 ENCOUNTER — Ambulatory Visit (HOSPITAL_BASED_OUTPATIENT_CLINIC_OR_DEPARTMENT_OTHER): Payer: No Typology Code available for payment source | Attending: Nurse Practitioner

## 2015-09-06 VITALS — Ht 74.0 in | Wt 218.0 lb

## 2015-09-06 DIAGNOSIS — G4733 Obstructive sleep apnea (adult) (pediatric): Secondary | ICD-10-CM | POA: Diagnosis not present

## 2015-09-06 DIAGNOSIS — R0683 Snoring: Secondary | ICD-10-CM | POA: Insufficient documentation

## 2015-09-06 DIAGNOSIS — I493 Ventricular premature depolarization: Secondary | ICD-10-CM | POA: Insufficient documentation

## 2015-09-07 ENCOUNTER — Encounter (HOSPITAL_COMMUNITY): Payer: Medicare Other

## 2015-09-08 ENCOUNTER — Telehealth: Payer: Self-pay | Admitting: Cardiology

## 2015-09-08 NOTE — Telephone Encounter (Signed)
FYI, patient has mild OSA with minimal O2 desats - I will see patient back in followup to discuss treatment options.  Please set up appt

## 2015-09-08 NOTE — Sleep Study (Signed)
   Patient Name: Rodney [name], Rodney [name] MRN: [id number] Study Date: 09/06/2015 Gender: Male D.O.B: [birth year] Age (years): 89 Referring Provider: Sherran Needs Interpreting Physician: Fransico Him MD, ABSM RPSGT: Madelon Lips  Weight (lbs): 220 Height (inches): 74 BMI: 28 Neck Size: 16.50  CLINICAL INFORMATION Sleep Study Type: NPSG Indication for sleep study: OSA Epworth Sleepiness Score: 4  SLEEP STUDY TECHNIQUE As per the AASM Manual for the Scoring of Sleep and Associated Events v2.3 (April 2016) with a hypopnea requiring 4% desaturations.  The channels recorded and monitored were frontal, central and occipital EEG, electrooculogram (EOG), submentalis EMG (chin), nasal and oral airflow, thoracic and abdominal wall motion, anterior tibialis EMG, snore microphone, electrocardiogram, and pulse oximetry.  MEDICATIONS Patient's medications include: Amlodipine, Eliquis, ASA, Lipitor, Coreg, Diltiazem, Tikosyn, Pepcid, Lasix, Avapro, Imdur, Claritin, Potassium. Medications self-administered by patient during sleep study : No sleep medicine administered.  SLEEP ARCHITECTURE The study was initiated at 10:07:23 PM and ended at 4:22:34 AM.  Sleep onset time was 65.4 minutes and the sleep efficiency was reduced at 51.7%. The total sleep time was 194.0 minutes.  Stage REM latency was 103.0 minutes.  The patient spent 18.81% of the night in stage N1 sleep, 53.09% in stage N2 sleep, 0.77% in stage N3 and 27.32% in REM.  Alpha intrusion was absent.  Supine sleep was 13.68%.  RESPIRATORY PARAMETERS The overall apnea/hypopnea index (AHI) was 6.2 per hour. There were 5 total apneas, including 0 obstructive, 5 central and 0 mixed apneas. There were 15 hypopneas and 8 RERAs.  The AHI during Stage REM sleep was 3.4 per hour.  AHI while supine was 33.9 per hour.  The mean oxygen saturation was 91.49%. The minimum SpO2 during sleep was 88.00%.  Moderate snoring was noted during this  study.  CARDIAC DATA The 2 lead EKG demonstrated sinus rhythm. The mean heart rate was 52.23 beats per minute. Other EKG findings include: PVCs.  LEG MOVEMENT DATA The total PLMS were 0 with a resulting PLMS index of 0.00. Associated arousal with leg movement index was 0.0 .  IMPRESSIONS Mild obstructive sleep apnea occurred during this study (AHI = 6.2/h). No significant central sleep apnea occurred during this study (CAI = 1.5/h). The patient had minimal or no oxygen desaturation during the study (Min O2 = 88.00%) The patient snored with Moderate snoring volume. EKG findings include PVCs. Clinically significant periodic limb movements did not occur during sleep. No significant associated arousals.  DIAGNOSIS Obstructive Sleep Apnea (327.23 [G47.33 ICD-10])  RECOMMENDATIONS Positional therapy avoiding supine position during sleep. Very mild obstructive sleep apnea. Return to discuss treatment options. Avoid alcohol, sedatives and other CNS depressants that may worsen sleep apnea and disrupt normal sleep architecture. Sleep hygiene should be reviewed to assess factors that may improve sleep quality. Weight management and regular exercise should be initiated or continued if appropriate.   [place], American Board of Sleep Medicine  ELECTRONICALLY SIGNED ON:  09/08/2015, 10:50 PM [place] PH: [phone]   FX: [phone] [place]

## 2015-09-08 NOTE — Sleep Study (Signed)
This encounter was created in error - please disregard.

## 2015-09-09 ENCOUNTER — Encounter (HOSPITAL_COMMUNITY)
Admission: RE | Admit: 2015-09-09 | Discharge: 2015-09-09 | Disposition: A | Discharge status: Home / Self Care | Payer: Medicare Other | Source: Ambulatory Visit | Attending: Internal Medicine | Admitting: Internal Medicine

## 2015-09-09 ENCOUNTER — Encounter: Payer: Self-pay | Admitting: Internal Medicine

## 2015-09-09 DIAGNOSIS — Z955 Presence of coronary angioplasty implant and graft: Secondary | ICD-10-CM | POA: Diagnosis not present

## 2015-09-09 NOTE — Telephone Encounter (Signed)
Patient is aware of results. Appointment to discuss treatment for OSA

## 2015-09-11 ENCOUNTER — Encounter (HOSPITAL_COMMUNITY)
Admission: RE | Admit: 2015-09-11 | Discharge: 2015-09-11 | Disposition: A | Discharge status: Home / Self Care | Payer: Medicare Other | Source: Ambulatory Visit | Attending: Internal Medicine | Admitting: Internal Medicine

## 2015-09-11 DIAGNOSIS — Z955 Presence of coronary angioplasty implant and graft: Secondary | ICD-10-CM | POA: Diagnosis not present

## 2015-09-14 ENCOUNTER — Encounter (HOSPITAL_COMMUNITY): Payer: Medicare Other

## 2015-09-16 ENCOUNTER — Encounter (HOSPITAL_COMMUNITY)
Admission: RE | Admit: 2015-09-16 | Discharge: 2015-09-16 | Disposition: A | Discharge status: Home / Self Care | Payer: Medicare Other | Source: Ambulatory Visit | Attending: Internal Medicine | Admitting: Internal Medicine

## 2015-09-16 DIAGNOSIS — Z955 Presence of coronary angioplasty implant and graft: Secondary | ICD-10-CM | POA: Diagnosis present

## 2015-09-18 ENCOUNTER — Encounter (HOSPITAL_COMMUNITY)
Admission: RE | Admit: 2015-09-18 | Discharge: 2015-09-18 | Disposition: A | Discharge status: Home / Self Care | Payer: Medicare Other | Source: Ambulatory Visit | Attending: Internal Medicine | Admitting: Internal Medicine

## 2015-09-18 DIAGNOSIS — Z955 Presence of coronary angioplasty implant and graft: Secondary | ICD-10-CM | POA: Diagnosis not present

## 2015-09-21 ENCOUNTER — Encounter (HOSPITAL_COMMUNITY): Payer: Medicare Other

## 2015-09-23 ENCOUNTER — Encounter (HOSPITAL_COMMUNITY): Payer: Medicare Other

## 2015-09-23 ENCOUNTER — Other Ambulatory Visit: Payer: Self-pay | Admitting: Internal Medicine

## 2015-09-23 NOTE — Telephone Encounter (Signed)
Rx(s) sent to pharmacy electronically.  

## 2015-09-25 ENCOUNTER — Encounter (HOSPITAL_COMMUNITY)
Admission: RE | Admit: 2015-09-25 | Discharge: 2015-09-25 | Disposition: A | Discharge status: Home / Self Care | Payer: Medicare Other | Source: Ambulatory Visit | Attending: Internal Medicine | Admitting: Internal Medicine

## 2015-09-25 ENCOUNTER — Encounter (HOSPITAL_COMMUNITY): Payer: Self-pay | Admitting: Nurse Practitioner

## 2015-09-25 ENCOUNTER — Ambulatory Visit (HOSPITAL_COMMUNITY)
Admission: RE | Admit: 2015-09-25 | Discharge: 2015-09-25 | Disposition: A | Discharge status: Home / Self Care | Payer: No Typology Code available for payment source | Source: Ambulatory Visit | Attending: Nurse Practitioner | Admitting: Nurse Practitioner

## 2015-09-25 VITALS — BP 120/72 | HR 101 | Ht 74.0 in | Wt 216.8 lb

## 2015-09-25 DIAGNOSIS — I480 Paroxysmal atrial fibrillation: Secondary | ICD-10-CM | POA: Diagnosis present

## 2015-09-25 LAB — BASIC METABOLIC PANEL
ANION GAP: 7 (ref 5–15)
BUN: 9 mg/dL (ref 6–20)
CALCIUM: 9.4 mg/dL (ref 8.9–10.3)
CO2: 26 mmol/L (ref 22–32)
Chloride: 109 mmol/L (ref 101–111)
Creatinine, Ser: 1.27 mg/dL — ABNORMAL HIGH (ref 0.61–1.24)
GFR, EST NON AFRICAN AMERICAN: 54 mL/min — AB (ref >60)
Glucose, Bld: 123 mg/dL — ABNORMAL HIGH (ref 65–99)
Potassium: 4.1 mmol/L (ref 3.5–5.1)
SODIUM: 142 mmol/L (ref 135–145)

## 2015-09-25 LAB — MAGNESIUM: MAGNESIUM: 2.2 mg/dL (ref 1.7–2.4)

## 2015-09-25 NOTE — Progress Notes (Signed)
Bill's is in atrial fibrillation this morning. Heart rate 100-120's. Initial blood pressure 98/60. Oxygen saturation 97% on room air. Patient asymptomatic. The atrial fibrillation clinic notified. Mr Bona was at the atrial fib clinic prior to coming to cardiac rehab. I advised that mr Lindvall not exercise today the patient is going home to take his PRN diltiazem.  I called the Atrial fib clinic to notify. The patient plans to return to exercise on Monday.

## 2015-09-25 NOTE — Progress Notes (Signed)
Patient ID: Rodney [name], male   DOB: Apr 17, 1941, 74 y.o.   MRN: [id number]     Primary Care Physician: Rodney Jacobson, MD Referring Physician: Dr. Debara [name] Cardiologist: Dr. Debarah [name] Rodney [name] is a 74 y.o. male with a h/o PAF on Tikosyn 250 mg bid, CAD s/p stenting of bifurcation stenosis involving the circumflex, AAA s/p stent repair, HTN, HLD, that is beingevaluated in the afib clinic.  Review of lifestyle revealed that pt was drinking 2 mixed drinks containing crown royal a night and is positive for snoring and apnea as witnessed by the wife.  He has been counseled re alcohol and increased afib burden. He just had a sleep study with mild sleep apnea and is pending an appointment with Rodney [name] for discussion of sleep hygiene measures. He has minimal caffeine use and  has had smoking cessation.Marland Kitchen He is exercising regularly in cardiac rehab and is not overweight. He is compliant with eliquis and tikosyn. Chadsvasc score is at least 5.   He is in afib this am rate controlled and was surprised that he was in afib. He can tell by his BP cuff if he is in afib but has not noticed any funny readings recently at home. He feels for the most part that he is staying in rhythm.  Today, he denies symptoms of palpitations, chest pain, shortness of breath, orthopnea, PND, lower extremity edema, dizziness, presyncope, syncope, or neurologic sequela. The patient is tolerating medications without difficulties and is otherwise without complaint today.   Past Medical History  Diagnosis Date  . Hypertension   . Hyperlipidemia   . Gout   . CAD (coronary artery disease)     a. cath 2010 b. CAD s/p PCI to ramus and LCX on 03/04/15  . AAA (abdominal aortic aneurysm) ([facility])     stent graft put in 02/2009  . Arthritis   . Paroxysmal atrial fibrillation ([facility])   . Tobacco abuse    Past Surgical History  Procedure Laterality Date  . Abdominal aortic aneurysm repair  02/19/2009    performed by Rodney [name]  .  Colonoscopy    . Knee arthroscopy  2003    left  . Shoulder arthroscopy with rotator cuff repair and subacromial decompression Left 11/19/2013    Procedure: LEFT SHOULDER ARTHROSCOPY DEBRIDEMENT EXTENTSIVE DISTAL CLAVICULECTOMY DECOMPRESSION PARTIAL ACROMIOPLASTY WITH CORACOACROMIAL WITH ROTATOR CUFF REPAIR ;  Surgeon: Rodney Butters, MD;  Location: [facility];  Service: Orthopedics;  Laterality: Left;  . Left and right heart catheterization with coronary angiogram N/A 03/03/2015    Procedure: LEFT AND RIGHT HEART CATHETERIZATION WITH CORONARY ANGIOGRAM;  Surgeon: Rodney Sine, MD;    . Percutaneous coronary stent intervention (pci-s) N/A 03/04/2015    Procedure: PERCUTANEOUS CORONARY STENT INTERVENTION (PCI-S);  Surgeon: Rodney Sine, MD; RI 99>>0% w/   BMS, bifurcation CFX-OM 90>>40% w/ Angiosculpt PTCA; dCFX 90>>0% w/   2.7520 mm Rebel BMS    Current Outpatient Prescriptions  Medication Sig Dispense Refill  . amLODipine (NORVASC) 10 MG tablet TAKE 1 TABLET (10 MG TOTAL) BY MOUTH DAILY. 30 tablet 2  . apixaban (ELIQUIS) 5 MG TABS tablet Take 1 tablet (5 mg total) by mouth 2 (two) times daily. 180 tablet 3  . aspirin EC 81 MG tablet Take 81 mg by mouth daily.    Marland Kitchen atorvastatin (LIPITOR) 80 MG tablet Take 1 tablet (80 mg total) by mouth daily. 90 tablet 3  . carvedilol (COREG) 6.25 MG tablet Take  1 tablet (6.25 mg total) by mouth 2 (two) times daily with a meal. 60 tablet 5  . dofetilide (TIKOSYN) 250 MCG capsule Take 1 capsule (250 mcg total) by mouth 2 (two) times daily. 60 capsule 5  . famotidine (PEPCID) 40 MG tablet Take 1 tablet (40 mg total) by mouth 2 (two) times daily. 180 tablet 1  . fish oil-omega-3 fatty acids 1000 MG capsule Take 1 g by mouth daily.     . furosemide (LASIX) 20 MG tablet Take 1 tablet (20 mg total) by mouth daily. 30 tablet 0  . irbesartan (AVAPRO) 300 MG tablet Take 1 tablet (300 mg total) by mouth daily. 30 tablet 11  . isosorbide mononitrate  (IMDUR) 60 MG 24 hr tablet Take 1 tablet (60 mg total) by mouth daily. 30 tablet 11  . loratadine (CLARITIN) 10 MG tablet Take 10 mg by mouth daily as needed for allergies.    . Multiple Vitamin (MULTIVITAMIN WITH MINERALS) TABS tablet Take 1 tablet by mouth daily.    . nicotine (NICODERM CQ - DOSED IN MG/24 HOURS) 14 mg/24hr patch Place 7 mg onto the skin daily.     . potassium chloride (K-DUR) 10 MEQ tablet Take 1 tablet (10 mEq total) by mouth daily. 30 tablet 0  . diltiazem (CARDIZEM) 30 MG tablet Take 1 tablet every 4 hours AS NEEDED for irregular HR>100 and BP>100 (Patient not taking: Reported on 09/25/2015) 30 tablet 1  . nitroGLYCERIN (NITROSTAT) 0.4 MG SL tablet Place 1 tablet (0.4 mg total) under the tongue every 5 (five) minutes as needed for chest pain (CP or SOB). (Patient not taking: Reported on 08/21/2015) 25 tablet 12   No current facility-administered medications for this encounter.    Allergies  Allergen Reactions  . Iohexol      Code: RASH, Desc: PAITENT STATED THAT HE BEGAN ITCHING TOWARDS END OF INJECTIN OF IV CONTRAST-- 13 HR PREP RECOMMENDED/MMS   . Lisinopril Cough    Social History   Social History  . Marital Status: Married    Spouse Name: N/A  . Number of Children: N/A  . Years of Education: N/A   Occupational History  . Not on file.   Social History Main Topics  . Smoking status: Former Smoker -- 0.50 packs/day for 50 years    Types: Cigarettes  . Smokeless tobacco: Never Used  . Alcohol Use: 4.2 - 4.8 oz/week    6 Cans of beer, 1-2 Shots of liquor per week     Comment: couple shots of royal per day  . Drug Use: No  . Sexual Activity: Not Currently     Comment: cutting down 1/2 ppd   Other Topics Concern  . Not on file   Social History Narrative   Lives with wife, does not use assist, drives.  No home health services.      Family History  Problem Relation Age of Onset  . Diabetes Sister   . Hypertension Sister   . Cancer Father      prostate  . Hypertension Mother   . Stroke Neg Hx   . Heart attack Neg Hx     ROS- All systems are reviewed and negative except as per the HPI above  Physical Exam: Filed Vitals:   09/25/15 0836  BP: 120/72  Pulse: 101  Height: 6\' 2"  (1.88 m)  Weight: 216 lb 12.8 oz (98.34 kg)    GEN- The patient is well appearing, alert and oriented x 3 today.  Head- normocephalic, atraumatic Eyes-  Sclera clear, conjunctiva pink Ears- hearing intact Oropharynx- clear Neck- supple, no JVP Lymph- no cervical lymphadenopathy Lungs- Clear to ausculation bilaterally, normal work of breathing Heart- Irregular rate and rhythm, no murmurs, rubs or gallops, PMI not laterally displaced GI- soft, NT, ND, + BS Extremities- no clubbing, cyanosis, or edema MS- no significant deformity or atrophy Skin- no rash or lesion Psych- euthymic mood, full affect Neuro- strength and sensation are intact  EKG-afib at 101 bpm, qrs int 88 ms, QRS duration 88 ms, QTc 433  Echo-Left ventricle: The cavity size was normal. Wall thickness was increased in a pattern of moderate LVH. Systolic function was moderately reduced. The estimated ejection fraction was in the range of 35% to 40%. Diffuse hypokinesis. There is mild hypokinesis of the mid-apicalanterolateral myocardium. Doppler parameters are consistent with abnormal left ventricular relaxation (grade 1 diastolic dysfunction). Doppler parameters are consistent with high ventricular filling pressure. - Mitral valve: There was mild regurgitation.  Impressions:  - Global L. Strain -13.4. Left atrium normal in size 31 mm.  Epic records reviewed. Ekg afib with v rate at 105 bpm, ST/t wave abnormality unchanged from previous. QRS int 90 ms, QTc 399 ms.   Assessment and Plan: 1.  Asymptomatic PAF Some recent breakthrough  afib from which is asymptomatic and is rate controlled when he is in afib. Continue tikosyn, carvedilol and eliquis. He has  30 mg cardizem to use if HR over 845 and systolic BP over 364. If in future should fail Tikosyn, may be a good candidate for ablation given normal left atrial size. Bmet/mag today  2. Lifestyle factors contributing to afib burden Limit alcohol use to no more than 2 drinks a week Continue with smoking cessation efforts Recent sleep study with mild sleep apnea, pending an appointment with Rodney [name] He was educated on when to report to the ER and when to call the office for issues with afib Continue exercise efforts thru cardiac rehab.  F/u 3 months  Geroge Baseman. Jarian Longoria, [facility] [address] [place], [place] [address] [phone]

## 2015-09-28 ENCOUNTER — Encounter (HOSPITAL_COMMUNITY)
Admission: RE | Admit: 2015-09-28 | Discharge: 2015-09-28 | Disposition: A | Discharge status: Home / Self Care | Payer: Medicare Other | Source: Ambulatory Visit | Attending: Internal Medicine | Admitting: Internal Medicine

## 2015-09-28 DIAGNOSIS — Z955 Presence of coronary angioplasty implant and graft: Secondary | ICD-10-CM | POA: Diagnosis not present

## 2015-09-28 NOTE — Progress Notes (Signed)
Patient returned to exercise at cardiac rehab and was in sinus rhythm this morning. Vital signs stable. No complaints during exercise today.

## 2015-09-30 ENCOUNTER — Ambulatory Visit (INDEPENDENT_AMBULATORY_CARE_PROVIDER_SITE_OTHER): Payer: Medicare Other | Admitting: Cardiology

## 2015-09-30 ENCOUNTER — Encounter: Payer: Self-pay | Admitting: Cardiology

## 2015-09-30 ENCOUNTER — Encounter (HOSPITAL_COMMUNITY)
Admission: RE | Admit: 2015-09-30 | Discharge: 2015-09-30 | Disposition: A | Discharge status: Home / Self Care | Payer: Medicare Other | Source: Ambulatory Visit | Attending: Internal Medicine | Admitting: Internal Medicine

## 2015-09-30 VITALS — BP 132/72 | HR 64 | Ht 74.0 in | Wt 219.0 lb

## 2015-09-30 DIAGNOSIS — I480 Paroxysmal atrial fibrillation: Secondary | ICD-10-CM

## 2015-09-30 DIAGNOSIS — G4733 Obstructive sleep apnea (adult) (pediatric): Secondary | ICD-10-CM | POA: Insufficient documentation

## 2015-09-30 DIAGNOSIS — I10 Essential (primary) hypertension: Secondary | ICD-10-CM | POA: Diagnosis not present

## 2015-09-30 DIAGNOSIS — Z955 Presence of coronary angioplasty implant and graft: Secondary | ICD-10-CM | POA: Diagnosis not present

## 2015-09-30 HISTORY — DX: Obstructive sleep apnea (adult) (pediatric): G47.33

## 2015-09-30 NOTE — Patient Instructions (Signed)
Medication Instructions:  Your physician recommends that you continue on your current medications as directed. Please refer to the Current Medication list given to you today.   Labwork: None  Testing/Procedures: Dr. Radford Pax recommends you have a [place] (sleeping on your side). Bethany, CMA will be in touch with you soon to schedule this.  Follow-Up: Your physician recommends that you schedule a follow-up appointment AS NEEDED with Dr. Radford Pax pending your results.  Any Other Special Instructions Will Be Listed Below (If Applicable).

## 2015-09-30 NOTE — Progress Notes (Signed)
Cardiology Office Note   Date:  09/30/2015   ID:  Rodney [name], DOB 12/19/1940, MRN [id number]  PCP:  [name], MD    Chief Complaint  Patient presents with  . Sleep Apnea      History of Present Illness: Rodney [name] is a 74 y.o. male who presents for evaluation of OSA.  He was referred by Roderic Palau for evaluation of OSA due to mild snoring and PAF.  He was found to have very mild OSA with an AHI of 6/hr overall but 34/hr while sleeping supine.  There was moderate snoring.  Oxygens saturations dropped to 88% with respiratory events.  He denies any excessive daytime fatigue or non restorative sleep.      Past Medical History  Diagnosis Date  . Hypertension   . Hyperlipidemia   . Gout   . CAD (coronary artery disease)     a. cath 2010 b. CAD s/p PCI to ramus and LCX on 03/04/15  . AAA (abdominal aortic aneurysm) ([facility])     stent graft put in 02/2009  . Arthritis   . Paroxysmal atrial fibrillation (H[facility])   . Tobacco abuse   . OSA (obstructive sleep apnea) 09/30/2015    Very mild with AHI 6.2/hr    Past Surgical History  Procedure Laterality Date  . Abdominal aortic aneurysm repair  02/19/2009    performed by [facility]  . Colonoscopy    . Knee arthroscopy  2003    left  . Shoulder arthroscopy with rotator cuff repair and subacromial decompression Left 11/19/2013    Procedure: LEFT SHOULDER ARTHROSCOPY DEBRIDEMENT EXTENTSIVE DISTAL CLAVICULECTOMY DECOMPRESSION PARTIAL ACROMIOPLASTY WITH CORACOACROMIAL WITH ROTATOR CUFF REPAIR ;  Surgeon: Renette Butters, MD;  Location: [facility];  Service: Orthopedics;  Laterality: Left;  . Left and right heart catheterization with coronary angiogram N/A 03/03/2015    Procedure: LEFT AND RIGHT HEART CATHETERIZATION WITH CORONARY ANGIOGRAM;  Surgeon: Troy Sine, MD;    . Percutaneous coronary stent intervention (pci-s) N/A 03/04/2015    Procedure: PERCUTANEOUS CORONARY STENT INTERVENTION  (PCI-S);  Surgeon: Troy Sine, MD; RI 99>>0% w/   BMS, bifurcation CFX-OM 90>>40% w/ Angiosculpt PTCA; dCFX 90>>0% w/   2.7520 mm Rebel BMS     Current Outpatient Prescriptions  Medication Sig Dispense Refill  . amLODipine (NORVASC) 10 MG tablet TAKE 1 TABLET (10 MG TOTAL) BY MOUTH DAILY. 30 tablet 2  . apixaban (ELIQUIS) 5 MG TABS tablet Take 1 tablet (5 mg total) by mouth 2 (two) times daily. 180 tablet 3  . aspirin EC 81 MG tablet Take 81 mg by mouth daily.    Marland Kitchen atorvastatin (LIPITOR) 80 MG tablet Take 1 tablet (80 mg total) by mouth daily. 90 tablet 3  . carvedilol (COREG) 6.25 MG tablet Take 1 tablet (6.25 mg total) by mouth 2 (two) times daily with a meal. 60 tablet 5  . diltiazem (CARDIZEM) 30 MG tablet Take 1 tablet every 4 hours AS NEEDED for irregular HR>100 and BP>100 (Patient not taking: Reported on 09/25/2015) 30 tablet 1  . dofetilide (TIKOSYN) 250 MCG capsule Take 1 capsule (250 mcg total) by mouth 2 (two) times daily. 60 capsule 5  . famotidine (PEPCID) 40 MG tablet Take 1 tablet (40 mg total) by mouth 2 (two) times daily. 180 tablet 1  . fish oil-omega-3 fatty acids 1000 MG capsule Take 1 g  by mouth daily.     . furosemide (LASIX) 20 MG tablet Take 1 tablet (20 mg total) by mouth daily. 30 tablet 0  . irbesartan (AVAPRO) 300 MG tablet Take 1 tablet (300 mg total) by mouth daily. 30 tablet 11  . isosorbide mononitrate (IMDUR) 60 MG 24 hr tablet Take 1 tablet (60 mg total) by mouth daily. 30 tablet 11  . loratadine (CLARITIN) 10 MG tablet Take 10 mg by mouth daily as needed for allergies.    . Multiple Vitamin (MULTIVITAMIN WITH MINERALS) TABS tablet Take 1 tablet by mouth daily.    . nicotine (NICODERM CQ - DOSED IN MG/24 HOURS) 14 mg/24hr patch Place 7 mg onto the skin daily.     . nitroGLYCERIN (NITROSTAT) 0.4 MG SL tablet Place 1 tablet (0.4 mg total) under the tongue every 5 (five) minutes as needed for chest pain (CP or SOB). (Patient not taking: Reported on 08/21/2015)  25 tablet 12  . potassium chloride (K-DUR) 10 MEQ tablet Take 1 tablet (10 mEq total) by mouth daily. 30 tablet 0   No current facility-administered medications for this visit.    Allergies:   Iohexol and Lisinopril    Social History:  The patient  reports that he has quit smoking. His smoking use included Cigarettes. He has a 25 pack-year smoking history. He has never used smokeless tobacco. He reports that he drinks about 4.2 - 4.8 oz of alcohol per week. He reports that he does not use illicit drugs.   Family History:  The patient's family history includes Diabetes in his sister; Hypertension in his mother and sister; Prostate cancer in his father. There is no history of Stroke or Heart attack.    ROS:  Please see the history of present illness.   Otherwise, review of systems are positive for none.   All other systems are reviewed and negative.    PHYSICAL EXAM: VS:  BP 132/72 mmHg  Pulse 64  Ht 6\' 2"  (1.88 m)  Wt 219 lb (99.338 kg)  BMI 28.11 kg/m2  SpO2 63% , BMI Body mass index is 28.11 kg/(m^2). GEN: Well nourished, well developed, in no acute distress HEENT: normal Neck: no JVD, carotid bruits, or masses Cardiac: RRR; no murmurs, rubs, or gallops,no edema  Respiratory:  clear to auscultation bilaterally, normal work of breathing GI: soft, nontender, nondistended, + BS MS: no deformity or atrophy Skin: warm and dry, no rash Neuro:  Strength and sensation are intact Psych: euthymic mood, full affect   EKG:  EKG is not ordered today.    Recent Labs: 03/16/2015: ALT 30; B Natriuretic Peptide 164.4*; TSH 0.407 07/25/2015: Hemoglobin 14.7; Platelets 133* 09/25/2015: BUN 9; Creatinine, Ser 1.27*; Magnesium 2.2; Potassium 4.1; Sodium 142    Lipid Panel    Component Value Date/Time   CHOL 125 02/21/2015 0555   TRIG 173* 02/21/2015 0555   HDL 43 02/21/2015 0555   CHOLHDL 2.9 02/21/2015 0555   VLDL 35 02/21/2015 0555   LDLCALC 47 02/21/2015 0555      Wt Readings  from Last 3 Encounters:  09/30/15 219 lb (99.338 kg)  09/25/15 216 lb 12.8 oz (98.34 kg)  09/06/15 218 lb (98.884 kg)       ASSESSMENT AND PLAN:  1.  Very mild OSA with an AHI of 6.2/hr.  Most events occurred while supine.  He does not have any excessive daytime sleepiness and no significant oxygen desaturations during sleep.  His AHI if very low and I doubt this  is triggering his Afib.  Most of his respiratory events occurred in the supine position.  I have recommended that he avoid sleeping in the supine position.  I do not think his OSA is bad enough to warrant any further treatment at this time without any symptoms.  I have recommended repeating a HST at home with him sleeping on his back and if no significant OSA then no further treatment. 2.  PAF - maintaining NSR on CCB/BB 3.  HTN - BP controlled on amlodipine/Coreg/Cardizem  Current medicines are reviewed at length with the patient today.  The patient does not have concerns regarding medicines.  The following changes have been made:  no change  Labs/ tests ordered today: See above Assessment and Plan No orders of the defined types were placed in this encounter.     Disposition:   FU with me PRN pending results of home sleep study  SignedSueanne Margarita, MD  09/30/2015 3:04 PM    [facility] [place], [place], [place]  [address] Phone: [phone]; Fax: [phone]

## 2015-10-02 ENCOUNTER — Encounter (HOSPITAL_COMMUNITY)
Admission: RE | Admit: 2015-10-02 | Discharge: 2015-10-02 | Disposition: A | Discharge status: Home / Self Care | Payer: Medicare Other | Source: Ambulatory Visit | Attending: Internal Medicine | Admitting: Internal Medicine

## 2015-10-02 DIAGNOSIS — Z955 Presence of coronary angioplasty implant and graft: Secondary | ICD-10-CM | POA: Diagnosis not present

## 2015-10-02 NOTE — Progress Notes (Addendum)
Rodney [name] is in atrial fibrillation this morning at a controlled rate in the 80's. Patient asymptomatic. Blood pressure 124/74. Oxygen saturation 97% on room air. The Atrial fib clinic called and notified. Bill exercised without complaints or difficulties. Vital signs stable. Rodney [name] said he took a PRN diltiazem at 0400. Will continue to monitor the patient throughout  the program. Rodney [name] converted to sinus brady post exercise 47 nonsustained. Blood pressure 118/70. Heart rate went back up into the 60's. The Atrial fibrillation clinic was called and notified. Will fax exercise flow sheets to the atrial fib clinic  office for review.

## 2015-10-05 ENCOUNTER — Encounter (HOSPITAL_COMMUNITY)
Admission: RE | Admit: 2015-10-05 | Discharge: 2015-10-05 | Disposition: A | Discharge status: Home / Self Care | Payer: Medicare Other | Source: Ambulatory Visit | Attending: Internal Medicine | Admitting: Internal Medicine

## 2015-10-05 DIAGNOSIS — Z955 Presence of coronary angioplasty implant and graft: Secondary | ICD-10-CM | POA: Diagnosis not present

## 2015-10-07 ENCOUNTER — Encounter (HOSPITAL_COMMUNITY): Payer: Medicare Other

## 2015-10-09 ENCOUNTER — Encounter (HOSPITAL_COMMUNITY)
Admission: RE | Admit: 2015-10-09 | Discharge: 2015-10-09 | Disposition: A | Discharge status: Home / Self Care | Payer: Medicare Other | Source: Ambulatory Visit | Attending: Internal Medicine | Admitting: Internal Medicine

## 2015-10-09 DIAGNOSIS — Z955 Presence of coronary angioplasty implant and graft: Secondary | ICD-10-CM | POA: Diagnosis not present

## 2015-10-12 ENCOUNTER — Encounter (HOSPITAL_COMMUNITY)
Admission: RE | Admit: 2015-10-12 | Discharge: 2015-10-12 | Disposition: A | Discharge status: Home / Self Care | Payer: Medicare Other | Source: Ambulatory Visit | Attending: Internal Medicine | Admitting: Internal Medicine

## 2015-10-12 DIAGNOSIS — Z955 Presence of coronary angioplasty implant and graft: Secondary | ICD-10-CM | POA: Diagnosis not present

## 2015-10-14 ENCOUNTER — Encounter (HOSPITAL_COMMUNITY)
Admission: RE | Admit: 2015-10-14 | Discharge: 2015-10-14 | Disposition: A | Discharge status: Home / Self Care | Payer: Medicare Other | Source: Ambulatory Visit | Attending: Internal Medicine | Admitting: Internal Medicine

## 2015-10-14 DIAGNOSIS — Z955 Presence of coronary angioplasty implant and graft: Secondary | ICD-10-CM | POA: Insufficient documentation

## 2015-10-15 ENCOUNTER — Encounter: Payer: Self-pay | Admitting: Internal Medicine

## 2015-10-16 ENCOUNTER — Encounter (HOSPITAL_COMMUNITY)
Admission: RE | Admit: 2015-10-16 | Discharge: 2015-10-16 | Disposition: A | Discharge status: Home / Self Care | Payer: Medicare Other | Source: Ambulatory Visit | Attending: Internal Medicine | Admitting: Internal Medicine

## 2015-10-16 DIAGNOSIS — Z955 Presence of coronary angioplasty implant and graft: Secondary | ICD-10-CM | POA: Diagnosis not present

## 2015-10-19 ENCOUNTER — Other Ambulatory Visit: Payer: Self-pay | Admitting: Internal Medicine

## 2015-10-19 ENCOUNTER — Telehealth: Payer: Self-pay | Admitting: Internal Medicine

## 2015-10-19 ENCOUNTER — Encounter (HOSPITAL_COMMUNITY)
Admission: RE | Admit: 2015-10-19 | Discharge: 2015-10-19 | Disposition: A | Discharge status: Home / Self Care | Payer: Medicare Other | Source: Ambulatory Visit | Attending: Internal Medicine | Admitting: Internal Medicine

## 2015-10-19 DIAGNOSIS — Z955 Presence of coronary angioplasty implant and graft: Secondary | ICD-10-CM | POA: Diagnosis not present

## 2015-10-19 MED ORDER — FAMOTIDINE 40 MG PO TABS: 40.0000 mg | ORAL_TABLET | Freq: Two times a day (BID) | ORAL | Status: DC

## 2015-10-19 NOTE — Telephone Encounter (Signed)
°*  STAT* If patient is at the pharmacy, call can be transferred to refill team.   1. Which medications need to be refilled? (please list name of each medication and dose if known) Pepcid-Please call today,he is completely out of his medicine.  2. Which pharmacy/location (including street and city if local pharmacy) is medication to be sent to?[phone] 3. Do they need a 30 day or 90 day supply? 90 and refills

## 2015-10-19 NOTE — Telephone Encounter (Signed)
Rx(s) sent to pharmacy electronically.  

## 2015-10-21 ENCOUNTER — Encounter (HOSPITAL_COMMUNITY): Payer: Medicare Other

## 2015-10-23 ENCOUNTER — Encounter (HOSPITAL_COMMUNITY)
Admission: RE | Admit: 2015-10-23 | Discharge: 2015-10-23 | Disposition: A | Discharge status: Home / Self Care | Payer: Medicare Other | Source: Ambulatory Visit | Attending: Internal Medicine | Admitting: Internal Medicine

## 2015-10-23 DIAGNOSIS — Z955 Presence of coronary angioplasty implant and graft: Secondary | ICD-10-CM | POA: Diagnosis not present

## 2015-10-23 NOTE — Progress Notes (Signed)
Pt graduated from cardiac rehab program today with completion of 36 exercise sessions in Phase II. Pt maintained good attendance and progressed nicely during his participation in rehab as evidenced by increased MET level.   Medication list reconciled. Repeat  PHQ score- 1 .  Pt has made significant lifestyle changes and should be commended for his success. Pt feels he has achieved his goals during cardiac rehab.   Pt plans to continue exercise via [employer] at the [employer]. Mr Dominic is still smoking and plans to attend a smoking cessation class at the [place].

## 2015-11-10 ENCOUNTER — Encounter: Payer: Self-pay | Admitting: Internal Medicine

## 2015-12-25 ENCOUNTER — Ambulatory Visit (HOSPITAL_COMMUNITY)
Admission: RE | Admit: 2015-12-25 | Discharge: 2015-12-25 | Disposition: A | Discharge status: Home / Self Care | Payer: No Typology Code available for payment source | Source: Ambulatory Visit | Attending: Nurse Practitioner | Admitting: Nurse Practitioner

## 2015-12-25 ENCOUNTER — Encounter (HOSPITAL_COMMUNITY): Payer: Self-pay | Admitting: Nurse Practitioner

## 2015-12-25 VITALS — BP 138/78 | HR 69 | Ht 74.0 in | Wt 222.6 lb

## 2015-12-25 DIAGNOSIS — I480 Paroxysmal atrial fibrillation: Secondary | ICD-10-CM | POA: Diagnosis present

## 2015-12-25 DIAGNOSIS — I10 Essential (primary) hypertension: Secondary | ICD-10-CM | POA: Insufficient documentation

## 2015-12-25 DIAGNOSIS — I2510 Atherosclerotic heart disease of native coronary artery without angina pectoris: Secondary | ICD-10-CM | POA: Diagnosis not present

## 2015-12-25 LAB — BASIC METABOLIC PANEL
Anion gap: 7 (ref 5–15)
BUN: 14 mg/dL (ref 6–20)
CALCIUM: 8.9 mg/dL (ref 8.9–10.3)
CHLORIDE: 111 mmol/L (ref 101–111)
CO2: 25 mmol/L (ref 22–32)
CREATININE: 1.21 mg/dL (ref 0.61–1.24)
GFR calc Af Amer: >60 mL/min (ref >60)
GFR calc non Af Amer: 58 mL/min — ABNORMAL LOW (ref >60)
Glucose, Bld: 119 mg/dL — ABNORMAL HIGH (ref 65–99)
Potassium: 4 mmol/L (ref 3.5–5.1)
SODIUM: 143 mmol/L (ref 135–145)

## 2015-12-25 LAB — MAGNESIUM: MAGNESIUM: 2.1 mg/dL (ref 1.7–2.4)

## 2015-12-25 NOTE — Progress Notes (Signed)
Patient ID: BUKHARI SERVEDIO, male   DOB: [birth year], 75 y.o.   MRN: [id number]     Primary Care Physician: [name], MD Referring Physician: Dr. Debara Pickett Cardiologist: Dr. Debarah Crape KAVEN DELTORO is a 75 y.o. male with a h/o PAF on Tikosyn 250 mg bid, CAD s/p stenting of bifurcation stenosis involving the circumflex, AAA s/p stent repair, HTN, HLD.  Review of lifestyle revealed that pt was drinking 2 mixed drinks containing crown royal a night and is positive for snoring and apnea as witnessed by the wife.  He was counseled re alcohol and increased afib burden. He j had a sleep study with mild sleep apnea and has an appointment with Dr. Radford Pax for discussion of sleep hygiene measures.  He was found to have mild sleep apnea not requiring cpap.He has minimal caffeine use and  has had smoking cessation.. He has finiished  cardiac rehab and is not overweight. Plays golf weather requiring. He is compliant with eliquis and tikosyn. Chadsvasc score is at least 5.   He has had some breakthrough afib in past usually picked up in cardiac rehab for which he was asymptomatic. He denies any sensation of irregular heart beat. He does note some mild gum bleeding from movement of his partial, intermittently, is on asa for cad and eliquis for Afib. He may want to try holdiing fish oil to see if less bleeding.  Today, he denies symptoms of palpitations, chest pain, shortness of breath, orthopnea, PND, lower extremity edema, dizziness, presyncope, syncope, or neurologic sequela. The patient is tolerating medications without difficulties and is otherwise without complaint today.   Past Medical History  Diagnosis Date  . Hypertension   . Hyperlipidemia   . Gout   . CAD (coronary artery disease)     a. cath 2010 b. CAD s/p PCI to ramus and LCX on 03/04/15  . AAA (abdominal aortic aneurysm) ([facility] [place])     stent graft put in 02/2009  . Arthritis   . Paroxysmal atrial fibrillation ([facility])   . Tobacco abuse   . OSA  (obstructive sleep apnea) 09/30/2015    Very mild with AHI 6.2/hr   Past Surgical History  Procedure Laterality Date  . Abdominal aortic aneurysm repair  02/19/2009    performed by [facility]  . Colonoscopy    . Knee arthroscopy  2003    left  . Shoulder arthroscopy with rotator cuff repair and subacromial decompression Left 11/19/2013    Procedure: LEFT SHOULDER ARTHROSCOPY DEBRIDEMENT EXTENTSIVE DISTAL CLAVICULECTOMY DECOMPRESSION PARTIAL ACROMIOPLASTY WITH CORACOACROMIAL WITH ROTATOR CUFF REPAIR ;  Surgeon: Renette Butters, MD;  Location: [facility];  Service: Orthopedics;  Laterality: Left;  . Left and right heart catheterization with coronary angiogram N/A 03/03/2015    Procedure: LEFT AND RIGHT HEART CATHETERIZATION WITH CORONARY ANGIOGRAM;  Surgeon: Troy Sine, MD;    . Percutaneous coronary stent intervention (pci-s) N/A 03/04/2015    Procedure: PERCUTANEOUS CORONARY STENT INTERVENTION (PCI-S);  Surgeon: Troy Sine, MD; RI 99>>0% w/   BMS, bifurcation CFX-OM 90>>40% w/ Angiosculpt PTCA; dCFX 90>>0% w/   2.7520 mm Rebel BMS    Current Outpatient Prescriptions  Medication Sig Dispense Refill  . amLODipine (NORVASC) 10 MG tablet TAKE 1 TABLET (10 MG TOTAL) BY MOUTH DAILY. 30 tablet 2  . apixaban (ELIQUIS) 5 MG TABS tablet Take 1 tablet (5 mg total) by mouth 2 (two) times daily. 180 tablet 3  . aspirin EC 81 MG tablet Take 81 mg  by mouth daily.    Marland Kitchen atorvastatin (LIPITOR) 80 MG tablet Take 1 tablet (80 mg total) by mouth daily. 90 tablet 3  . carvedilol (COREG) 6.25 MG tablet Take 1 tablet (6.25 mg total) by mouth 2 (two) times daily with a meal. 60 tablet 5  . diltiazem (CARDIZEM) 30 MG tablet Take 1 tablet every 4 hours AS NEEDED for irregular HR>100 and BP>100 30 tablet 1  . dofetilide (TIKOSYN) 250 MCG capsule Take 1 capsule (250 mcg total) by mouth 2 (two) times daily. 60 capsule 5  . famotidine (PEPCID) 40 MG tablet Take 1 tablet (40 mg total) by mouth 2 (two) times  daily. 180 tablet 1  . fish oil-omega-3 fatty acids 1000 MG capsule Take 1 g by mouth daily.     . furosemide (LASIX) 20 MG tablet Take 1 tablet (20 mg total) by mouth daily. 30 tablet 0  . irbesartan (AVAPRO) 300 MG tablet Take 1 tablet (300 mg total) by mouth daily. 30 tablet 11  . isosorbide mononitrate (IMDUR) 60 MG 24 hr tablet Take 1 tablet (60 mg total) by mouth daily. 30 tablet 11  . loratadine (CLARITIN) 10 MG tablet Take 10 mg by mouth daily as needed for allergies.    . Multiple Vitamin (MULTIVITAMIN WITH MINERALS) TABS tablet Take 1 tablet by mouth daily.    . nicotine (NICODERM CQ - DOSED IN MG/24 HOURS) 14 mg/24hr patch Place 7 mg onto the skin daily.     . nitroGLYCERIN (NITROSTAT) 0.4 MG SL tablet Place 1 tablet (0.4 mg total) under the tongue every 5 (five) minutes as needed for chest pain (CP or SOB). 25 tablet 12  . potassium chloride (K-DUR) 10 MEQ tablet Take 1 tablet (10 mEq total) by mouth daily. 30 tablet 0   No current facility-administered medications for this encounter.    Allergies  Allergen Reactions  . Iohexol      Code: RASH, Desc: PAITENT STATED THAT HE BEGAN ITCHING TOWARDS END OF INJECTIN OF IV CONTRAST-- 13 HR PREP RECOMMENDED/MMS   . Lisinopril Cough    Social History   Social History  . Marital Status: Married    Spouse Name: N/A  . Number of Children: N/A  . Years of Education: N/A   Occupational History  . Not on file.   Social History Main Topics  . Smoking status: Former Smoker -- 0.50 packs/day for 50 years    Types: Cigarettes  . Smokeless tobacco: Never Used  . Alcohol Use: 4.2 - 4.8 oz/week    6 Cans of beer, 1-2 Shots of liquor per week     Comment: couple shots of royal per day  . Drug Use: No  . Sexual Activity: Not Currently     Comment: cutting down 1/2 ppd   Other Topics Concern  . Not on file   Social History Narrative   Lives with wife, does not use assist, drives.  No home health services.      Family History    Problem Relation Age of Onset  . Diabetes Sister   . Hypertension Sister   . Prostate cancer Father   . Hypertension Mother   . Stroke Neg Hx   . Heart attack Neg Hx     ROS- All systems are reviewed and negative except as per the HPI above  Physical Exam: Filed Vitals:   12/25/15 0840  BP: 138/78  Pulse: 69  Height: 6\' 2"  (1.88 m)  Weight: 222 lb 9.6 oz (100.971  kg)    GEN- The patient is well appearing, alert and oriented x 3 today.   Head- normocephalic, atraumatic Eyes-  Sclera clear, conjunctiva pink Ears- hearing intact Oropharynx- clear Neck- supple, no JVP Lymph- no cervical lymphadenopathy Lungs- Clear to ausculation bilaterally, normal work of breathing Heart- Irregular rate and rhythm, no murmurs, rubs or gallops, PMI not laterally displaced GI- soft, NT, ND, + BS Extremities- no clubbing, cyanosis, or edema MS- no significant deformity or atrophy Skin- no rash or lesion Psych- euthymic mood, full affect Neuro- strength and sensation are intact  EKG-afib at 101 bpm, qrs int 88 ms, QRS duration 88 ms, QTc 433  Echo-Left ventricle: The cavity size was normal. Wall thickness was increased in a pattern of moderate LVH. Systolic function was moderately reduced. The estimated ejection fraction was in the range of 35% to 40%. Diffuse hypokinesis. There is mild hypokinesis of the mid-apicalanterolateral myocardium. Doppler parameters are consistent with abnormal left ventricular relaxation (grade 1 diastolic dysfunction). Doppler parameters are consistent with high ventricular filling pressure. - Mitral valve: There was mild regurgitation.  Impressions:  - Global L. Strain -13.4. Left atrium normal in size 31 mm.  Epic records reviewed. Ekg - NSR at 69 bpm, pr it 190 ms, qrs int 92 ms, qtc 465 ms   Assessment and Plan:  1.  Asymptomatic PAF For most part seems to be staying in rhythm with tikosyn and carvedilol, qtc stable Continue  apixaban May want to hold fish oil for several weeks to see if gum bleeding is improved Bmet/mag today  2. Lifestyle factors contributing to afib burden Limit alcohol use to no more than 2 drinks a week Continue with smoking cessation   Recent sleep study with mild sleep apnea, no cpap required Continue exercise efforts   3. CAD Appears stable Continue asa,stain, bb, nitro  Will get appointment with DR. Hilty since it has been over 6 months and no recall is showing in EPIC  4. HTN Stable Continue amlodipine, irbesartan, lasix  F/u 3 months in afib clinic for monitoring of tiksoyn  Butch Penny C. Maverick Dieudonne, [facility] [address] [place], [place] [address] [phone]

## 2015-12-25 NOTE — Patient Instructions (Signed)
Scheduler will be in touch with you regarding follow up appointment with Dr. Debara Pickett.

## 2016-02-03 ENCOUNTER — Other Ambulatory Visit: Payer: Self-pay | Admitting: Internal Medicine

## 2016-02-03 NOTE — Telephone Encounter (Signed)
Patient walked in office requesting refill for amlodipine.Refill sent to pharmacy.Advised keep appointment with Dr.Hilty 03/30/16 at 1:30 pm.

## 2016-02-27 ENCOUNTER — Other Ambulatory Visit: Payer: Self-pay | Admitting: Physician Assistant

## 2016-03-03 ENCOUNTER — Other Ambulatory Visit: Payer: Self-pay | Admitting: *Deleted

## 2016-03-03 MED ORDER — IRBESARTAN 300 MG PO TABS: 300.0000 mg | ORAL_TABLET | Freq: Every day | ORAL | Status: DC

## 2016-03-18 ENCOUNTER — Encounter (HOSPITAL_COMMUNITY): Payer: Self-pay | Admitting: Nurse Practitioner

## 2016-03-18 ENCOUNTER — Other Ambulatory Visit (HOSPITAL_COMMUNITY): Payer: Self-pay | Admitting: *Deleted

## 2016-03-18 ENCOUNTER — Ambulatory Visit (HOSPITAL_COMMUNITY)
Admission: RE | Admit: 2016-03-18 | Discharge: 2016-03-18 | Disposition: A | Discharge status: Home / Self Care | Payer: No Typology Code available for payment source | Source: Ambulatory Visit | Attending: Nurse Practitioner | Admitting: Nurse Practitioner

## 2016-03-18 VITALS — BP 130/66 | HR 59 | Ht 74.0 in | Wt 222.4 lb

## 2016-03-18 DIAGNOSIS — I10 Essential (primary) hypertension: Secondary | ICD-10-CM | POA: Insufficient documentation

## 2016-03-18 DIAGNOSIS — Z87891 Personal history of nicotine dependence: Secondary | ICD-10-CM | POA: Insufficient documentation

## 2016-03-18 DIAGNOSIS — Z7901 Long term (current) use of anticoagulants: Secondary | ICD-10-CM | POA: Diagnosis not present

## 2016-03-18 DIAGNOSIS — I480 Paroxysmal atrial fibrillation: Secondary | ICD-10-CM | POA: Insufficient documentation

## 2016-03-18 DIAGNOSIS — I2510 Atherosclerotic heart disease of native coronary artery without angina pectoris: Secondary | ICD-10-CM | POA: Diagnosis not present

## 2016-03-18 DIAGNOSIS — G4733 Obstructive sleep apnea (adult) (pediatric): Secondary | ICD-10-CM | POA: Diagnosis not present

## 2016-03-18 DIAGNOSIS — Z79899 Other long term (current) drug therapy: Secondary | ICD-10-CM | POA: Insufficient documentation

## 2016-03-18 DIAGNOSIS — Z955 Presence of coronary angioplasty implant and graft: Secondary | ICD-10-CM | POA: Insufficient documentation

## 2016-03-18 DIAGNOSIS — Z7982 Long term (current) use of aspirin: Secondary | ICD-10-CM | POA: Insufficient documentation

## 2016-03-18 DIAGNOSIS — M109 Gout, unspecified: Secondary | ICD-10-CM | POA: Diagnosis not present

## 2016-03-18 DIAGNOSIS — I4819 Other persistent atrial fibrillation: Secondary | ICD-10-CM

## 2016-03-18 DIAGNOSIS — Z8249 Family history of ischemic heart disease and other diseases of the circulatory system: Secondary | ICD-10-CM | POA: Diagnosis not present

## 2016-03-18 DIAGNOSIS — I4891 Unspecified atrial fibrillation: Secondary | ICD-10-CM

## 2016-03-18 DIAGNOSIS — Z8042 Family history of malignant neoplasm of prostate: Secondary | ICD-10-CM | POA: Insufficient documentation

## 2016-03-18 DIAGNOSIS — Z833 Family history of diabetes mellitus: Secondary | ICD-10-CM | POA: Insufficient documentation

## 2016-03-18 DIAGNOSIS — E785 Hyperlipidemia, unspecified: Secondary | ICD-10-CM | POA: Insufficient documentation

## 2016-03-18 LAB — BASIC METABOLIC PANEL
ANION GAP: 10 (ref 5–15)
BUN: 10 mg/dL (ref 6–20)
CHLORIDE: 105 mmol/L (ref 101–111)
CO2: 22 mmol/L (ref 22–32)
Calcium: 8.8 mg/dL — ABNORMAL LOW (ref 8.9–10.3)
Creatinine, Ser: 1.2 mg/dL (ref 0.61–1.24)
GFR calc Af Amer: >60 mL/min (ref >60)
GFR, EST NON AFRICAN AMERICAN: 58 mL/min — AB (ref >60)
GLUCOSE: 105 mg/dL — AB (ref 65–99)
POTASSIUM: 3.9 mmol/L (ref 3.5–5.1)
Sodium: 137 mmol/L (ref 135–145)

## 2016-03-18 LAB — MAGNESIUM: Magnesium: 2 mg/dL (ref 1.7–2.4)

## 2016-03-18 NOTE — Progress Notes (Signed)
Patient ID: Rodney [name], male   DOB: 1941-10-10, 75 y.o.   MRN: [id number]     Primary Care Physician: [name], MD Referring Physician: Dr. Debara Pickett Cardiologist: Dr. Debarah Crape SAVEER BIVENS is a 75 y.o. male with a h/o PAF on Tikosyn 250 mg bid, CAD s/p stenting of bifurcation stenosis involving the circumflex, 02/25/15, AAA s/p stent repair, HTN, HLD.  Review of lifestyle revealed that pt was drinking 2 mixed drinks containing crown royal a night and is positive for snoring and apnea as witnessed by the wife.  He was counseled re alcohol and increased afib burden. He  had a sleep study with mild sleep apnea and had an appointment with Dr. Radford Pax for discussion of sleep hygiene measures.  He was found to have mild sleep apnea not requiring cpap.He has minimal caffeine use and  has had smoking cessation.. He has finiished  cardiac rehab and is not overweight. Plays golf weather requiring. He is compliant with eliquis and tikosyn. Chadsvasc score is at least 5.   He has had some breakthrough afib in past usually picked up in cardiac rehab for which he was asymptomatic. He denies any sensation of irregular heart beat. He does note some mild gum bleeding from movement of his partial, intermittently, is on asa for cad and eliquis for Afib. He may want to try holdiing fish oil to see if less bleeding.  4/7, back for f/u in afib for tikosyn use. Maintaining SR. No c/o chest pain,but still has some gum bleeding where irritated with his partial. He avoids wearing for most part. He states he will require more dental surgery in the near future for lower teeth.  Today, he denies symptoms of palpitations, chest pain, shortness of breath, orthopnea, PND, lower extremity edema, dizziness, presyncope, syncope, or neurologic sequela. The patient is tolerating medications without difficulties and is otherwise without complaint today.   Past Medical History  Diagnosis Date  . Hypertension   .  Hyperlipidemia   . Gout   . CAD (coronary artery disease)     a. cath 2010 b. CAD s/p PCI to ramus and LCX on 03/04/15  . AAA (abdominal aortic aneurysm) ([facility])     stent graft put in 02/2009  . Arthritis   . Paroxysmal atrial fibrillation ([facility])   . Tobacco abuse   . OSA (obstructive sleep apnea) 09/30/2015    Very mild with AHI 6.2/hr   Past Surgical History  Procedure Laterality Date  . Abdominal aortic aneurysm repair  02/19/2009    performed by [facility]  . Colonoscopy    . Knee arthroscopy  2003    left  . Shoulder arthroscopy with rotator cuff repair and subacromial decompression Left 11/19/2013    Procedure: LEFT SHOULDER ARTHROSCOPY DEBRIDEMENT EXTENTSIVE DISTAL CLAVICULECTOMY DECOMPRESSION PARTIAL ACROMIOPLASTY WITH CORACOACROMIAL WITH ROTATOR CUFF REPAIR ;  Surgeon: Renette Butters, MD;  Location: [facility];  Service: Orthopedics;  Laterality: Left;  . Left and right heart catheterization with coronary angiogram N/A 03/03/2015    Procedure: LEFT AND RIGHT HEART CATHETERIZATION WITH CORONARY ANGIOGRAM;  Surgeon: Troy Sine, MD;    . Percutaneous coronary stent intervention (pci-s) N/A 03/04/2015    Procedure: PERCUTANEOUS CORONARY STENT INTERVENTION (PCI-S);  Surgeon: Troy Sine, MD; RI 99>>0% w/   BMS, bifurcation CFX-OM 90>>40% w/ Angiosculpt PTCA; dCFX 90>>0% w/   2.7520 mm Rebel BMS    Current Outpatient Prescriptions  Medication Sig Dispense Refill  . amLODipine (NORVASC)  10 MG tablet TAKE 1 TABLET BY MOUTH EVERY DAY 30 tablet 6  . apixaban (ELIQUIS) 5 MG TABS tablet Take 1 tablet (5 mg total) by mouth 2 (two) times daily. 180 tablet 3  . aspirin EC 81 MG tablet Take 81 mg by mouth daily.    Marland Kitchen atorvastatin (LIPITOR) 80 MG tablet Take 1 tablet (80 mg total) by mouth daily. 90 tablet 3  . carvedilol (COREG) 6.25 MG tablet Take 1 tablet (6.25 mg total) by mouth 2 (two) times daily with a meal. 60 tablet 5  . diltiazem (CARDIZEM) 30 MG tablet Take 1 tablet  every 4 hours AS NEEDED for irregular HR>100 and BP>100 30 tablet 1  . dofetilide (TIKOSYN) 250 MCG capsule Take 1 capsule (250 mcg total) by mouth 2 (two) times daily. 60 capsule 5  . famotidine (PEPCID) 40 MG tablet Take 1 tablet (40 mg total) by mouth 2 (two) times daily. 180 tablet 1  . fish oil-omega-3 fatty acids 1000 MG capsule Take 1 g by mouth daily.     . furosemide (LASIX) 20 MG tablet Take 1 tablet (20 mg total) by mouth daily. 30 tablet 0  . irbesartan (AVAPRO) 300 MG tablet Take 1 tablet (300 mg total) by mouth daily. 30 tablet 11  . isosorbide mononitrate (IMDUR) 60 MG 24 hr tablet TAKE 1 TABLET BY MOUTH EVERY DAY 30 tablet 1  . KLOR-CON M10 10 MEQ tablet TAKE 1 TABLET EVERY DAY 90 tablet 3  . loratadine (CLARITIN) 10 MG tablet Take 10 mg by mouth daily as needed for allergies.    . Multiple Vitamin (MULTIVITAMIN WITH MINERALS) TABS tablet Take 1 tablet by mouth daily.    . nitroGLYCERIN (NITROSTAT) 0.4 MG SL tablet Place 1 tablet (0.4 mg total) under the tongue every 5 (five) minutes as needed for chest pain (CP or SOB). 25 tablet 12   No current facility-administered medications for this encounter.    Allergies  Allergen Reactions  . Iohexol      Code: RASH, Desc: PAITENT STATED THAT HE BEGAN ITCHING TOWARDS END OF INJECTIN OF IV CONTRAST-- 13 HR PREP RECOMMENDED/MMS   . Lisinopril Cough    Social History   Social History  . Marital Status: Married    Spouse Name: N/A  . Number of Children: N/A  . Years of Education: N/A   Occupational History  . Not on file.   Social History Main Topics  . Smoking status: Former Smoker -- 0.50 packs/day for 50 years    Types: Cigarettes  . Smokeless tobacco: Never Used  . Alcohol Use: 4.2 - 4.8 oz/week    6 Cans of beer, 1-2 Shots of liquor per week     Comment: couple shots of royal per day  . Drug Use: No  . Sexual Activity: Not Currently     Comment: cutting down 1/2 ppd   Other Topics Concern  . Not on file    Social History Narrative   Lives with wife, does not use assist, drives.  No home health services.      Family History  Problem Relation Age of Onset  . Diabetes Sister   . Hypertension Sister   . Prostate cancer Father   . Hypertension Mother   . Stroke Neg Hx   . Heart attack Neg Hx     ROS- All systems are reviewed and negative except as per the HPI above  Physical Exam: Filed Vitals:   03/18/16 0844  BP: 158/78  Pulse: 59  Height: 6\' 2"  (1.88 m)  Weight: 222 lb 6.4 oz (100.88 kg)    GEN- The patient is well appearing, alert and oriented x 3 today.   Head- normocephalic, atraumatic Eyes-  Sclera clear, conjunctiva pink Ears- hearing intact Oropharynx- clear Neck- supple, no JVP Lymph- no cervical lymphadenopathy Lungs- Clear to ausculation bilaterally, normal work of breathing Heart-Regular rate and rhythm, no murmurs, rubs or gallops, PMI not laterally displaced GI- soft, NT, ND, + BS Extremities- no clubbing, cyanosis, or edema MS- no significant deformity or atrophy Skin- no rash or lesion Psych- euthymic mood, full affect Neuro- strength and sensation are intact  EKG-Sinus brady at 59 bpm, NS st/t wave abnormality, Qtc 475 ms  Echo-Left ventricle: The cavity size was normal. Wall thickness was increased in a pattern of moderate LVH. Systolic function was moderately reduced. The estimated ejection fraction was in the range of 35% to 40%. Diffuse hypokinesis. There is mild hypokinesis of the mid-apicalanterolateral myocardium. Doppler parameters are consistent with abnormal left ventricular relaxation (grade 1 diastolic dysfunction). Doppler parameters are consistent with high ventricular filling pressure. - Mitral valve: There was mild regurgitation.  Impressions:  - Global L. Strain -13.4. Left atrium normal in size 31 mm.  Epic records reviewed.   Assessment and Plan:  1.  Asymptomatic PAF For most part seems to be staying in  rhythm with tikosyn and carvedilol, qtc stable Continue apixaban May want to hold fish oil for several weeks to see if gum bleeding is improved He does see Dr. Debara Pickett soon and he can see if asa can be decreased to maybe every other day? Bmet/mag today  2. Lifestyle factors contributing to afib burden Limit alcohol use to no more than 2 drinks a week Continue with smoking cessation   Recent sleep study with mild sleep apnea, no cpap required Continue exercise efforts   3. CAD Appears stable Continue asa,stain, bb, nitro   4. HTN Stable Continue amlodipine, irbesartan, lasix  F/u 3 months in afib clinic for monitoring of tiksoyn F/u with Dr. Debara Pickett as scheduled 4/19  Geroge Baseman. Hailie Searight, [facility] [address] [place], [place] [address] [phone]

## 2016-03-30 ENCOUNTER — Encounter: Payer: Self-pay | Admitting: Internal Medicine

## 2016-03-30 ENCOUNTER — Ambulatory Visit (INDEPENDENT_AMBULATORY_CARE_PROVIDER_SITE_OTHER): Payer: Medicare Other | Admitting: Internal Medicine

## 2016-03-30 VITALS — BP 142/77 | HR 54 | Ht 74.0 in | Wt 216.4 lb

## 2016-03-30 DIAGNOSIS — Z79899 Other long term (current) drug therapy: Secondary | ICD-10-CM

## 2016-03-30 DIAGNOSIS — I2583 Coronary atherosclerosis due to lipid rich plaque: Secondary | ICD-10-CM

## 2016-03-30 DIAGNOSIS — I255 Ischemic cardiomyopathy: Secondary | ICD-10-CM | POA: Diagnosis not present

## 2016-03-30 DIAGNOSIS — Z8679 Personal history of other diseases of the circulatory system: Secondary | ICD-10-CM | POA: Diagnosis not present

## 2016-03-30 DIAGNOSIS — I481 Persistent atrial fibrillation: Secondary | ICD-10-CM | POA: Diagnosis not present

## 2016-03-30 DIAGNOSIS — I5042 Chronic combined systolic (congestive) and diastolic (congestive) heart failure: Secondary | ICD-10-CM

## 2016-03-30 DIAGNOSIS — I2510 Atherosclerotic heart disease of native coronary artery without angina pectoris: Secondary | ICD-10-CM | POA: Diagnosis not present

## 2016-03-30 DIAGNOSIS — I480 Paroxysmal atrial fibrillation: Secondary | ICD-10-CM | POA: Diagnosis not present

## 2016-03-30 DIAGNOSIS — I4819 Other persistent atrial fibrillation: Secondary | ICD-10-CM

## 2016-03-30 DIAGNOSIS — Z9889 Other specified postprocedural states: Secondary | ICD-10-CM | POA: Diagnosis not present

## 2016-03-30 LAB — BASIC METABOLIC PANEL
BUN: 16 mg/dL (ref 7–25)
CALCIUM: 9 mg/dL (ref 8.6–10.3)
CO2: 24 mmol/L (ref 20–31)
CREATININE: 1.28 mg/dL — AB (ref 0.70–1.18)
Chloride: 108 mmol/L (ref 98–110)
Glucose, Bld: 81 mg/dL (ref 65–99)
Potassium: 4.4 mmol/L (ref 3.5–5.3)
SODIUM: 140 mmol/L (ref 135–146)

## 2016-03-30 MED ORDER — FUROSEMIDE 20 MG PO TABS: 20.0000 mg | ORAL_TABLET | Freq: Every day | ORAL | Status: DC

## 2016-03-30 MED ORDER — CARVEDILOL 6.25 MG PO TABS: 6.2500 mg | ORAL_TABLET | Freq: Two times a day (BID) | ORAL | Status: DC

## 2016-03-30 MED ORDER — ISOSORBIDE MONONITRATE ER 60 MG PO TB24: 60.0000 mg | ORAL_TABLET | Freq: Every day | ORAL | Status: DC

## 2016-03-30 NOTE — Patient Instructions (Signed)
Your physician recommends that you return for lab work TODAY - first floor of this building in [place] has requested that you have an echocardiogram. Echocardiography is a painless test that uses sound waves to create images of your heart. It provides your doctor with information about the size and shape of your heart and how well your heart's chambers and valves are working. This procedure takes approximately one hour. There are no restrictions for this procedure.  Your physician wants you to follow-up in: 1 year with Dr. Debara Pickett. You will receive a reminder letter in the mail two months in advance. If you don't receive a letter, please call our office to schedule the follow-up appointment.

## 2016-03-30 NOTE — Progress Notes (Signed)
OFFICE NOTE  Chief Complaint:  Routine follow-up  Primary Care Physician: [name], MD  HPI:  Rodney [name] is a 75 y.o. male with a past medical history significant for CAD, hypertension, dyslipidemia, and AAA s/p stent-grafting in 2012 by Dr. Kellie Simmering. Preoperatively, he underwent a stress test which was abnormal, showing an inferior defect consistent with possible scar and mild to moderate LV dysfunction. He had a LHC, which revealed the following:  ANGIOGRAPHY: Left main: The left main is fairly normal.  The left anterior descending artery has some proximal luminal irregularities between 20 and 30%. The mid vessel stenosis around 30- 40%. It gives off several small diagonal arteries. The first diagonal artery is moderate in size and has mild irregularities. The second diagonal artery is very tiny and has an 80% proximal stenosis. This vessel is quite small and is not a candidate for PTCA.  The remaining LAD has only minor luminal irregularities.  The left circumflex artery is a large vessel. It gives off a very high obtuse marginal artery. The remaining circumflex artery continues around the A-V groove and supplies a posterolateral branch.  The first obtuse marginal artery is subtotally occluded at 2 different sites. There was sluggish TIMI grade 1 flow through this vessel. This vessel reaches around the lateral wall and supplies a small amount of the inferior left ventricle. This was quite likely where the site of the abnormal Cardiolite.  The right coronary artery is large and dominant. There are minor luminal irregularities. The posterior descending artery and the posterolateral segment artery, they taper fairly quickly at the takeoff. This taper improved slightly with intracoronary nitroglycerin. There are only minor luminal irregularities between 20 and 30%.  The left ventriculogram was performed in a 30-RAO  position. It reveals a moderately dilated and hypocontractile left ventricle. The ejection fraction is probably 40%.  During this left ventriculogram, we noticed that he had a very dilated aortic root. We performed an aortic root angiography, which revealed a dilated aortic root. There was no evidence of aortic insufficiency.  He now presents with accelerated hypertension and dyspnea, but no chest pain -troponin is borderline elevated at 0.05 and 0.07, BNP is elevated at 331. CXR demonstrates pulmonary edema. EKG shows sinus rhythm with LVH and lateral repolarization changes. Cardiology is asked to consult regarding management.  [name] was recently hospitalized and treated for heart failure. He started on diuretic and we discontinued his nifedipine and switched him to carvedilol. He recently followed up with his primary care provider who noted he was in new onset atrial fibrillation. It's feasible that atrial fibrillation was the precipitating factor for his heart failure. He seems to be unaware of his atrial fibrillation. A repeat EKG in our office today shows that he is converted back to sinus rhythm but he does have deep anterolateral and inferior T-wave inversions. These could be due to repolarization secondary to LVH however given his history of known coronary artery disease, could represent ischemia.  I saw [name] back in the office today. He underwent a nuclear stress test which was interpreted as follows:  Overall Impression: High risk stress nuclear study due to  severely decreased LV function (EF 28% - down from 445). There is a moderate inferior wall scar. No reversible ischemia is seen.. Consider multivessel CAD  versus adverse post-infarction remodeling as cause of worsening  Cardiomyopathy.  [name] returns today for follow-up. Recently he underwent cardiac catheterization and underwent stenting to the ramus and  circumflex as follows:  Difficult but successful  complex intervention involving PTCA/BM stenting of a subtotal ramus intermediate vessel with the entire proximal region being reduced to 0% in an intermediate vessel which extended to the LV apex once opened; and Angiosculpt/PTCA of bifurcation stenosis involving the distal circumflex flex marginal branch and Angiosculpt/BM stenting of a dominant distal circumflex extending into the PD/PLA vessel.  Subsequently he was noted to have increasing problems with palpitations and atrial fibrillation with rapid ventricular response. We elected to start him on dofetilide which he did well with however had some QT prolongation on the 500 g dose. Subsequently was placed on 250 g twice a day and is tolerating that well. Overall he feels well and has not had recurrence since discharge. He did feel subjective palpitations recently and went to the emergency room. He also was notably hypertensive. His rhythm was sinus. Blood pressure still is persistently elevated despite being on max dose irbesartan, carvedilol, Imdur and Lasix. He denies any chest pain. He successfully stop smoking and currently using nicotine patches but is having cravings.  [name] returns today for follow-up. Overall he seems to be doing fairly well. He exercises regularly and has gotten his weight down to his lowest wait which he has not seen a number of years. He reports very infrequent episodes of A. fib, maybe 2 or 3 over the past year which represents good control on Tikosyn. 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 This has not been assessed in the last year. He occasionally gets some bleeding of his gums which of encouraged him to follow-up with his dentist. He needs to stay on aspirin on a daily basis because of the coronary benefit. Recently he saw Roderic Palau, NP for ongoing follow-up of Tikosyn. His potassium was 3.9 and she encouraged him to take potassium supplements to try to get it above 4.0. Were requested a recheck  his potassium today.  PMHx:  Past Medical History  Diagnosis Date  . Hypertension   . Hyperlipidemia   . Gout   . CAD (coronary artery disease)     a. cath 2010 b. CAD s/p PCI to ramus and LCX on 03/04/15  . AAA (abdominal aortic aneurysm) ([place])     stent graft put in 02/2009  . Arthritis   . Paroxysmal atrial fibrillation (HCC)   . Tobacco abuse   . OSA (obstructive sleep apnea) 09/30/2015    Very mild with AHI 6.2/hr    Past Surgical History  Procedure Laterality Date  . Abdominal aortic aneurysm repair  02/19/2009    performed by [facility]  . Colonoscopy    . Knee arthroscopy  2003    left  . Shoulder arthroscopy with rotator cuff repair and subacromial decompression Left 11/19/2013    Procedure: LEFT SHOULDER ARTHROSCOPY DEBRIDEMENT EXTENTSIVE DISTAL CLAVICULECTOMY DECOMPRESSION PARTIAL ACROMIOPLASTY WITH CORACOACROMIAL WITH ROTATOR CUFF REPAIR ;  Surgeon: Renette Butters, MD;  Location: [facility];  Service: Orthopedics;  Laterality: Left;  . Left and right heart catheterization with coronary angiogram N/A 03/03/2015    Procedure: LEFT AND RIGHT HEART CATHETERIZATION WITH CORONARY ANGIOGRAM;  Surgeon: Troy Sine, MD;    . Percutaneous coronary stent intervention (pci-s) N/A 03/04/2015    Procedure: PERCUTANEOUS CORONARY STENT INTERVENTION (PCI-S);  Surgeon: Troy Sine, MD; RI 99>>0% w/   BMS, bifurcation CFX-OM 90>>40% w/ Angiosculpt PTCA; dCFX 90>>0% w/   2.7520 mm Rebel BMS    FAMHx:  Family History  Problem Relation Age of Onset  . Diabetes Sister   . Hypertension Sister   . Prostate cancer Father   . Hypertension Mother   . Stroke Neg Hx   . Heart attack Neg Hx     SOCHx:   reports that he has quit smoking. His smoking use included Cigarettes. He has a 25 pack-year smoking history. He has never used smokeless tobacco. He reports that he drinks about 4.2 - 4.8 oz of alcohol per week. He reports that he does not use illicit drugs.  ALLERGIES:    Allergies  Allergen Reactions  . Iohexol      Code: RASH, Desc: PAITENT STATED THAT HE BEGAN ITCHING TOWARDS END OF INJECTIN OF IV CONTRAST-- 13 HR PREP RECOMMENDED/MMS   . Lisinopril Cough    ROS: Pertinent items noted in HPI and remainder of comprehensive ROS otherwise negative.  HOME MEDS: Current Outpatient Prescriptions  Medication Sig Dispense Refill  . amLODipine (NORVASC) 10 MG tablet TAKE 1 TABLET BY MOUTH EVERY DAY 30 tablet 6  . apixaban (ELIQUIS) 5 MG TABS tablet Take 1 tablet (5 mg total) by mouth 2 (two) times daily. 180 tablet 3  . aspirin EC 81 MG tablet Take 81 mg by mouth daily.    Marland Kitchen atorvastatin (LIPITOR) 80 MG tablet Take 1 tablet (80 mg total) by mouth daily. 90 tablet 3  . carvedilol (COREG) 6.25 MG tablet Take 1 tablet (6.25 mg total) by mouth 2 (two) times daily with a meal. 60 tablet 5  . diltiazem (CARDIZEM) 30 MG tablet Take 1 tablet every 4 hours AS NEEDED for irregular HR>100 and BP>100 30 tablet 1  . dofetilide (TIKOSYN) 250 MCG capsule Take 1 capsule (250 mcg total) by mouth 2 (two) times daily. 60 capsule 5  . famotidine (PEPCID) 40 MG tablet Take 1 tablet (40 mg total) by mouth 2 (two) times daily. 180 tablet 1  . fish oil-omega-3 fatty acids 1000 MG capsule Take 1 g by mouth daily.     . furosemide (LASIX) 20 MG tablet Take 1 tablet (20 mg total) by mouth daily. 30 tablet 0  . irbesartan (AVAPRO) 300 MG tablet Take 1 tablet (300 mg total) by mouth daily. 30 tablet 11  . isosorbide mononitrate (IMDUR) 60 MG 24 hr tablet TAKE 1 TABLET BY MOUTH EVERY DAY 30 tablet 1  . KLOR-CON M10 10 MEQ tablet TAKE 1 TABLET EVERY DAY 90 tablet 3  . loratadine (CLARITIN) 10 MG tablet Take 10 mg by mouth daily as needed for allergies.    . Multiple Vitamin (MULTIVITAMIN WITH MINERALS) TABS tablet Take 1 tablet by mouth daily.    . nitroGLYCERIN (NITROSTAT) 0.4 MG SL tablet Place 1 tablet (0.4 mg total) under the tongue every 5 (five) minutes as needed for chest pain (CP  or SOB). 25 tablet 12   No current facility-administered medications for this visit.    LABS/IMAGING: No results found for this or any previous visit (from the past 48 hour(s)). No results found.  VITALS: BP 142/77 mmHg  Pulse 54  Ht 6\' 2"  (1.88 m)  Wt 216 lb 6.4 oz (98.158 kg)  BMI 27.77 kg/m2  EXAM: General appearance: alert and no distress Neck: no carotid bruit and no JVD Lungs: clear to auscultation bilaterally Heart: regular rate and rhythm, S1, S2 normal, no murmur, click, rub or gallop Abdomen: soft, non-tender; bowel sounds normal; no masses,  no organomegaly Extremities: extremities normal, atraumatic, no cyanosis or edema Pulses: 2+ and symmetric  Skin: Skin color, texture, turgor normal. No rashes or lesions Neurologic: Grossly normal Psych: Pleasant  EKG: Sinus bradycardia 54, inferolateral T-wave changes-stable, QTC 458 ms   ASSESSMENT: 1. Recent PCI to the ramus intermedius and distal circumflex arteries (02/2015) 2. Ischemic cardiomyopathy EF 35-40%  3. History of systolic and diastolic congestive heart failure 4. Accelerated hypertension 5. History of abdominal aortic aneurysm repair 6. Paroxysmal atrial fibrillation - CHADSVASC 4, on dofetilide  PLAN: 1.   Mr. Haldane seems to have been fairly stable since PCI last year. At that time echo was showing EF of 35-40%. He is due for reassessment of that. We'll order repeat echo today. His weight is remaining down and he is able to exercise regularly. He's had very few breakthroughs of PAF and remains on Tikosyn as well as Eliquis. He takes low-dose aspirin for coronary disease which will need to be taken on a daily basis. Of encouraged him to follow-up with his dentist regarding his bleeding gums and may need to see a periodontist. Blood pressure appears well controlled today. We'll go ahead and recheck a metabolic profile to reassess potassium. If he remains below for eye may need to increase his standing potassium  dose.  Follow-up annually or sooner as necessary.  [name], MD, [facility] Attending Cardiologist [place] 03/30/2016, 1:57 PM

## 2016-04-19 ENCOUNTER — Ambulatory Visit (HOSPITAL_COMMUNITY): Payer: Medicare Other | Attending: Cardiology

## 2016-04-19 ENCOUNTER — Other Ambulatory Visit: Payer: Self-pay

## 2016-04-19 DIAGNOSIS — I77810 Thoracic aortic ectasia: Secondary | ICD-10-CM | POA: Diagnosis not present

## 2016-04-19 DIAGNOSIS — I2510 Atherosclerotic heart disease of native coronary artery without angina pectoris: Secondary | ICD-10-CM | POA: Diagnosis not present

## 2016-04-19 DIAGNOSIS — I509 Heart failure, unspecified: Secondary | ICD-10-CM | POA: Diagnosis not present

## 2016-04-19 DIAGNOSIS — Z87891 Personal history of nicotine dependence: Secondary | ICD-10-CM | POA: Diagnosis not present

## 2016-04-19 DIAGNOSIS — I255 Ischemic cardiomyopathy: Secondary | ICD-10-CM | POA: Diagnosis present

## 2016-04-19 DIAGNOSIS — I340 Nonrheumatic mitral (valve) insufficiency: Secondary | ICD-10-CM | POA: Diagnosis not present

## 2016-04-19 DIAGNOSIS — I110 Hypertensive heart disease with heart failure: Secondary | ICD-10-CM | POA: Insufficient documentation

## 2016-04-19 DIAGNOSIS — E785 Hyperlipidemia, unspecified: Secondary | ICD-10-CM | POA: Insufficient documentation

## 2016-04-19 DIAGNOSIS — G4733 Obstructive sleep apnea (adult) (pediatric): Secondary | ICD-10-CM | POA: Diagnosis not present

## 2016-04-19 DIAGNOSIS — I071 Rheumatic tricuspid insufficiency: Secondary | ICD-10-CM | POA: Insufficient documentation

## 2016-04-25 ENCOUNTER — Other Ambulatory Visit: Payer: Self-pay | Admitting: Internal Medicine

## 2016-04-25 NOTE — Telephone Encounter (Signed)
Rx request sent to pharmacy.  

## 2016-04-28 ENCOUNTER — Telehealth: Payer: Self-pay | Admitting: Internal Medicine

## 2016-04-28 NOTE — Telephone Encounter (Signed)
New message      Calling to confirm heart failure diagnosis and get most recent ejection fraction

## 2016-04-28 NOTE — Telephone Encounter (Signed)
Left message on secure voice mail  Recent echo 04/19/2016 -- EF 45-50% ANY QUESTION MAY CALL BACK

## 2016-05-10 ENCOUNTER — Telehealth: Payer: Self-pay | Admitting: Internal Medicine

## 2016-05-10 NOTE — Telephone Encounter (Signed)
Returned call and left msg for HF case manager w/ last EF and other requested information.

## 2016-05-10 NOTE — Telephone Encounter (Signed)
F/u  RN from [facility] calling to get the date of ECHO- that last EF was recorded- and pt's diagnosis of HF. Please call back and discuss.

## 2016-07-29 ENCOUNTER — Emergency Department (HOSPITAL_COMMUNITY)
Admission: EM | Admit: 2016-07-29 | Discharge: 2016-07-29 | Disposition: A | Discharge status: Home / Self Care | Payer: Medicare Other | Attending: Emergency Medicine | Admitting: Emergency Medicine

## 2016-07-29 ENCOUNTER — Emergency Department (HOSPITAL_COMMUNITY): Payer: Medicare Other

## 2016-07-29 ENCOUNTER — Encounter (HOSPITAL_COMMUNITY): Payer: Self-pay | Admitting: Emergency Medicine

## 2016-07-29 DIAGNOSIS — Z955 Presence of coronary angioplasty implant and graft: Secondary | ICD-10-CM | POA: Diagnosis not present

## 2016-07-29 DIAGNOSIS — I480 Paroxysmal atrial fibrillation: Secondary | ICD-10-CM | POA: Diagnosis not present

## 2016-07-29 DIAGNOSIS — I4891 Unspecified atrial fibrillation: Secondary | ICD-10-CM | POA: Diagnosis present

## 2016-07-29 DIAGNOSIS — I5042 Chronic combined systolic (congestive) and diastolic (congestive) heart failure: Secondary | ICD-10-CM | POA: Insufficient documentation

## 2016-07-29 DIAGNOSIS — I2510 Atherosclerotic heart disease of native coronary artery without angina pectoris: Secondary | ICD-10-CM | POA: Insufficient documentation

## 2016-07-29 DIAGNOSIS — Z87891 Personal history of nicotine dependence: Secondary | ICD-10-CM | POA: Diagnosis not present

## 2016-07-29 DIAGNOSIS — I110 Hypertensive heart disease with heart failure: Secondary | ICD-10-CM | POA: Diagnosis not present

## 2016-07-29 DIAGNOSIS — Z7982 Long term (current) use of aspirin: Secondary | ICD-10-CM | POA: Diagnosis not present

## 2016-07-29 DIAGNOSIS — Z7901 Long term (current) use of anticoagulants: Secondary | ICD-10-CM | POA: Diagnosis not present

## 2016-07-29 DIAGNOSIS — Z8673 Personal history of transient ischemic attack (TIA), and cerebral infarction without residual deficits: Secondary | ICD-10-CM | POA: Insufficient documentation

## 2016-07-29 DIAGNOSIS — I951 Orthostatic hypotension: Secondary | ICD-10-CM

## 2016-07-29 LAB — I-STAT TROPONIN, ED
TROPONIN I, POC: 0.01 ng/mL (ref 0.00–0.08)
Troponin i, poc: 0 ng/mL (ref 0.00–0.08)

## 2016-07-29 LAB — BASIC METABOLIC PANEL
ANION GAP: 8 (ref 5–15)
BUN: 11 mg/dL (ref 6–20)
CHLORIDE: 108 mmol/L (ref 101–111)
CO2: 23 mmol/L (ref 22–32)
Calcium: 8.8 mg/dL — ABNORMAL LOW (ref 8.9–10.3)
Creatinine, Ser: 1.23 mg/dL (ref 0.61–1.24)
GFR calc Af Amer: >60 mL/min (ref >60)
GFR, EST NON AFRICAN AMERICAN: 56 mL/min — AB (ref >60)
Glucose, Bld: 103 mg/dL — ABNORMAL HIGH (ref 65–99)
POTASSIUM: 4 mmol/L (ref 3.5–5.1)
SODIUM: 139 mmol/L (ref 135–145)

## 2016-07-29 LAB — CBC WITH DIFFERENTIAL/PLATELET
BASOS ABS: 0 10*3/uL (ref 0.0–0.1)
Basophils Relative: 0 %
EOS PCT: 2 %
Eosinophils Absolute: 0.1 10*3/uL (ref 0.0–0.7)
HEMATOCRIT: 41 % (ref 39.0–52.0)
HEMOGLOBIN: 13.7 g/dL (ref 13.0–17.0)
LYMPHS ABS: 1.7 10*3/uL (ref 0.7–4.0)
LYMPHS PCT: 36 %
MCH: 31.6 pg (ref 26.0–34.0)
MCHC: 33.4 g/dL (ref 30.0–36.0)
MCV: 94.5 fL (ref 78.0–100.0)
Monocytes Absolute: 0.4 10*3/uL (ref 0.1–1.0)
Monocytes Relative: 8 %
NEUTROS ABS: 2.6 10*3/uL (ref 1.7–7.7)
Neutrophils Relative %: 54 %
Platelets: 125 10*3/uL — ABNORMAL LOW (ref 150–400)
RBC: 4.34 MIL/uL (ref 4.22–5.81)
RDW: 13.8 % (ref 11.5–15.5)
WBC: 4.7 10*3/uL (ref 4.0–10.5)

## 2016-07-29 MED ORDER — SODIUM CHLORIDE 0.9 % IV BOLUS (SEPSIS): 500.0000 mL | Freq: Once | INTRAVENOUS | Status: DC

## 2016-07-29 NOTE — Discharge Instructions (Signed)
Read the information below.  You may return to the Emergency Department at any time for worsening condition or any new symptoms that concern you.   If you develop chest pain, shortness of breath, fever, you pass out, or become weak or dizzy, return to the ER for a recheck.    °

## 2016-07-29 NOTE — ED Notes (Signed)
Attempted to place PIV for orthostatic result, pt declined.  PA-C aware, PO fluids given, pt requested ginger ale.

## 2016-07-29 NOTE — ED Notes (Signed)
Gave pt water to drink per Humberto Seals, RN

## 2016-07-29 NOTE — ED Notes (Signed)
Pt ambulated to and from restroom; tolerated well 

## 2016-07-29 NOTE — ED Provider Notes (Signed)
[facility] DEPT Provider Note   CSN: [id number] Arrival date & time: 07/29/16  0700     History   Chief Complaint Chief Complaint  Patient presents with  . Atrial Fibrillation    HPI Rodney [name] is a 75 y.o. male.  HPI   Patient with hx paroxysmal afib, HTN, CHF, CAD with stent placement, AA repair p/w episode of afib this morning that continued longer than usual so he thought he should get checked out.  Reports he got up at his usual time to urinate and realized he felt like he was in afib - felt "drained" with some shortness of breath and lightheadedness, the lightheadedness is worse with standing.  Feels back to baseline now, though some of his lightheadedness is with standing.  Denies fevers, recent illness, leg swelling, orthopnea.    PCP Dr [name] Cardiolgist Dr Debara Pickett   Past Medical History:  Diagnosis Date  . AAA (abdominal aortic aneurysm) ([place])    stent graft put in 02/2009  . Arthritis   . CAD (coronary artery disease)    a. cath 2010 b. CAD s/p PCI to ramus and LCX on 03/04/15  . Gout   . Hyperlipidemia   . Hypertension   . OSA (obstructive sleep apnea) 09/30/2015   Very mild with AHI 6.2/hr  . Paroxysmal atrial fibrillation ([facility])   . Tobacco abuse     Patient Active Problem List   Diagnosis Date Noted  . Cardiomyopathy, ischemic 03/30/2016  . OSA (obstructive sleep apnea) 09/30/2015  . Encounter for monitoring anti-arrhythmic therapy 05/04/2015  . DOE (dyspnea on exertion) 03/16/2015  . Atrial fibrillation with RVR ([place]) 03/16/2015  . Hypertensive urgency 03/08/2015  . Coronary artery disease due to lipid rich plaque   . AA (aortic aneurysm) ([place])   . Dyspnea 03/02/2015  . Progressive angina ([place]) 03/02/2015  . History of TIA (transient ischemic attack), 02/20/15 03/02/2015  . Mitral regurgitation 03/02/2015  . HX: anticoagulation 03/02/2015  . Chest tightness   . Tobacco abuse   . HLD (hyperlipidemia)   . TIA (transient ischemic  attack) 02/20/2015  . 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 Chronic combined systolic and diastolic heart failure, NYHA class 1 ([place]) 02/20/2015  . PAF (paroxysmal atrial fibrillation) ([place]) 01/29/2015  . Accelerated hypertension, hx of 01/03/2015  . Acute diastolic CHF (congestive heart failure), NYHA class 2 ([place]) 01/03/2015  . Demand ischemia ([place]) 01/03/2015  . CAD (coronary artery disease) stent place 03/04/15 01/03/2015  . Troponin level elevated   . Thrombocytopenia ([place])   . Essential hypertension   . S/P abdominal aortic aneurysm repair 07/03/2012    Past Surgical History:  Procedure Laterality Date  . ABDOMINAL AORTIC ANEURYSM REPAIR  02/19/2009   performed by [facility]  . COLONOSCOPY    . KNEE ARTHROSCOPY  2003   left  . LEFT AND RIGHT HEART CATHETERIZATION WITH CORONARY ANGIOGRAM N/A 03/03/2015   Procedure: LEFT AND RIGHT HEART CATHETERIZATION WITH CORONARY ANGIOGRAM;  Surgeon: Troy Sine, MD;    . PERCUTANEOUS CORONARY STENT INTERVENTION (PCI-S) N/A 03/04/2015   Procedure: PERCUTANEOUS CORONARY STENT INTERVENTION (PCI-S);  Surgeon: Troy Sine, MD; RI 99>>0% w/   BMS, bifurcation CFX-OM 90>>40% w/ Angiosculpt PTCA; dCFX 90>>0% w/   2.7520 mm Rebel BMS  . SHOULDER ARTHROSCOPY WITH ROTATOR CUFF REPAIR AND SUBACROMIAL DECOMPRESSION Left 11/19/2013   Procedure: LEFT SHOULDER ARTHROSCOPY DEBRIDEMENT EXTENTSIVE DISTAL CLAVICULECTOMY DECOMPRESSION PARTIAL ACROMIOPLASTY WITH CORACOACROMIAL WITH ROTATOR CUFF REPAIR ;  Surgeon: Renette Butters, MD;  Location: [facility];  Service: Orthopedics;  Laterality: Left;       Home Medications    Prior to Admission medications   Medication Sig Start Date End Date Taking? Authorizing Provider  amLODipine (NORVASC) 10 MG tablet TAKE 1 TABLET BY MOUTH EVERY DAY 02/03/16  Yes [name], MD  apixaban (ELIQUIS) 5 MG TABS tablet Take 1 tablet (5 mg total) by mouth 2 (two) times daily. 05/26/15  Yes [name], MD  aspirin EC 81 MG  tablet Take 81 mg by mouth daily.   Yes Historical Provider, MD  atorvastatin (LIPITOR) 80 MG tablet Take 1 tablet (80 mg total) by mouth daily. 05/21/15  Yes [name], MD  carvedilol (COREG) 6.25 MG tablet Take 1 tablet (6.25 mg total) by mouth 2 (two) times daily with a meal. 03/30/16  Yes [name], MD  diltiazem (CARDIZEM) 30 MG tablet Take 1 tablet every 4 hours AS NEEDED for irregular HR>100 and BP>100 07/27/15  Yes Sherran Needs, NP  dofetilide (TIKOSYN) 250 MCG capsule Take 1 capsule (250 mcg total) by mouth 2 (two) times daily. 03/20/15  Yes Brittainy Erie Noe, PA-C  famotidine (PEPCID) 40 MG tablet Take 1 tablet (40 mg total) by mouth 2 (two) times daily. 10/19/15  Yes [name], MD  fish oil-omega-3 fatty acids 1000 MG capsule Take 1 g by mouth daily.    Yes Historical Provider, MD  furosemide (LASIX) 20 MG tablet Take 1 tablet (20 mg total) by mouth daily. 03/30/16  Yes [name], MD  irbesartan (AVAPRO) 300 MG tablet Take 1 tablet (300 mg total) by mouth daily. 03/03/16  Yes [name], MD  isosorbide mononitrate (IMDUR) 60 MG 24 hr tablet TAKE 1 TABLET BY MOUTH EVERY DAY 04/25/16  Yes [name], MD  KLOR-CON M10 10 MEQ tablet TAKE 1 TABLET EVERY DAY 02/03/16  Yes [name], MD  loratadine (CLARITIN) 10 MG tablet Take 10 mg by mouth daily as needed for allergies.   Yes Historical Provider, MD  Multiple Vitamin (MULTIVITAMIN WITH MINERALS) TABS tablet Take 1 tablet by mouth daily.   Yes Historical Provider, MD  nitroGLYCERIN (NITROSTAT) 0.4 MG SL tablet Place 1 tablet (0.4 mg total) under the tongue every 5 (five) minutes as needed for chest pain (CP or SOB). 03/09/15   Brett Canales, PA-C    Family History Family History  Problem Relation Age of Onset  . Prostate cancer Father   . Hypertension Mother   . Diabetes Sister   . Hypertension Sister   . Stroke Neg Hx   . Heart attack Neg Hx     Social History Social History  Substance Use Topics    . Smoking status: Former Smoker    Packs/day: 0.50    Years: 50.00    Types: Cigarettes  . Smokeless tobacco: Never Used  . Alcohol use 4.2 - 4.8 oz/week    6 Cans of beer, 1 - 2 Shots of liquor per week     Comment: couple shots of royal per day     Allergies   Iohexol and Lisinopril   Review of Systems Review of Systems  All other systems reviewed and are negative.    Physical Exam Updated Vital Signs BP 133/78 (BP Location: Right Arm)   Pulse (!) 53   Temp 98 F (36.7 C) (Oral)   Resp 13   Ht 6\' 2"  (1.88 m)   Wt 98.4 kg  SpO2 99%   BMI 27.86 kg/m   Physical Exam  Constitutional: He appears well-developed and well-nourished. No distress.  HENT:  Head: Normocephalic and atraumatic.  Neck: Neck supple.  Cardiovascular: Normal rate and regular rhythm.   Pulmonary/Chest: Effort normal and breath sounds normal. No respiratory distress. He has no wheezes. He has no rales.  Abdominal: Soft. He exhibits no distension and no mass. There is no tenderness. There is no rebound and no guarding.  Musculoskeletal: He exhibits no edema.  Neurological: He is alert. He exhibits normal muscle tone.  Skin: He is not diaphoretic.  Nursing note and vitals reviewed.    ED Treatments / Results  Labs (all labs ordered are listed, but only abnormal results are displayed) Labs Reviewed  BASIC METABOLIC PANEL - Abnormal; Notable for the following:       Result Value   Glucose, Bld 103 (*)    Calcium 8.8 (*)    GFR calc non Af Amer 56 (*)    All other components within normal limits  CBC WITH DIFFERENTIAL/PLATELET - Abnormal; Notable for the following:    Platelets 125 (*)    All other components within normal limits  I-STAT TROPOININ, ED  I-STAT TROPOININ, ED    EKG  EKG Interpretation  Date/Time:  Friday July 29 2016 11:58:50 EDT Ventricular Rate:  53 PR Interval:    QRS Duration: 126 QT Interval:  495 QTC Calculation: 465 R Axis:   15 Text Interpretation:   Sinus rhythm Nonspecific intraventricular conduction delay Nonspecific repol abnormality, diffuse leads No significant change was found Confirmed by Wyvonnia Dusky  MD, STEPHEN [phone]) on 07/29/2016 12:14:44 PM       Radiology Dg Chest 2 View  Result Date: 07/29/2016 CLINICAL DATA:  Right-sided chest pain and atrial fibrillation EXAM: CHEST  2 VIEW COMPARISON:  March 16, 2015 FINDINGS: There is mild left base atelectasis. Lungs elsewhere clear. Heart size and pulmonary vascularity are normal. No adenopathy. There is degenerative change in the mid thoracic spine. There is slight anterior wedging of the T11 and T12 vertebral bodies. IMPRESSION: Mild left base atelectasis.  No edema or consolidation. Electronically Signed   By: Lowella Grip III M.D.   On: 07/29/2016 07:58    Procedures Procedures (including critical care time)  Medications Ordered in ED Medications  sodium chloride 0.9 % bolus 500 mL (500 mLs Intravenous Not Given 07/29/16 0953)     Initial Impression / Assessment and Plan / ED Course  I have reviewed the triage vital signs and the nursing notes.  Pertinent labs & imaging results that were available during my care of the patient were reviewed by me and considered in my medical decision making (see chart for details).  Clinical Course  Comment By Time  Pt remains in sinus rhythm.  He is feeling well.  Will discharge home with cardiology follow up.  Clayton Bibles, PA-C 08/18 1208   Afebrile, nontoxic patient with sensation of having afib (lightheaded, feeling drained, SOB) at 4:30am, feeling better now.  EKG shows normal sinus.  Pt is on eliquis and aspirin.  Rate controlled. Workup reassuring.  Pt did have a drop in blood pressure with orthostatic testing but pt declined IVF.  Was drinking PO.  Dr Wyvonnia Dusky also saw patient, spoke with on call cardiologist Dr Radford Pax.  Please see his note for details.  Pt feeling well after second troponin, no further afib in ED.      D/C home with  cardiology follow up.  Discussed result, findings, treatment, and follow up  with patient.  Pt given return precautions.  Pt verbalizes understanding and agrees with plan.       Final Clinical Impressions(s) / ED Diagnoses   Final diagnoses:  Paroxysmal atrial fibrillation ([place])  Orthostasis    New Prescriptions Discharge Medication List as of 07/29/2016 12:09 PM       Clayton Bibles, PA-C 07/29/16 1531    Ezequiel Essex, MD 07/29/16 [phone]

## 2016-07-29 NOTE — ED Triage Notes (Signed)
Pt in from home with c/o AFib episode at home. States he was up at 4:30, took his vitals d/t feeling weak and sob. Pt states his monitor told him he was in afib, which he has hx of. States HR remained in 60's, but Afib lasted 1 hr. Pt awake, a&ox4, denies cp or n/v.

## 2016-07-29 NOTE — ED Notes (Signed)
Pt given ice water and informed that he can take his regular medications that he brought from home, per Dr. Wyvonnia Dusky.

## 2016-08-02 ENCOUNTER — Other Ambulatory Visit (HOSPITAL_COMMUNITY): Payer: Medicare Other

## 2016-08-02 ENCOUNTER — Ambulatory Visit: Payer: Medicare Other | Admitting: Family

## 2016-08-05 ENCOUNTER — Encounter (HOSPITAL_COMMUNITY): Payer: Self-pay | Admitting: Nurse Practitioner

## 2016-08-05 ENCOUNTER — Ambulatory Visit (HOSPITAL_COMMUNITY)
Admission: RE | Admit: 2016-08-05 | Discharge: 2016-08-05 | Disposition: A | Discharge status: Home / Self Care | Payer: Medicare Other | Source: Ambulatory Visit | Attending: Nurse Practitioner | Admitting: Nurse Practitioner

## 2016-08-05 VITALS — BP 138/74 | HR 57 | Ht 74.0 in | Wt 216.2 lb

## 2016-08-05 DIAGNOSIS — E785 Hyperlipidemia, unspecified: Secondary | ICD-10-CM | POA: Diagnosis not present

## 2016-08-05 DIAGNOSIS — I10 Essential (primary) hypertension: Secondary | ICD-10-CM | POA: Insufficient documentation

## 2016-08-05 DIAGNOSIS — F17210 Nicotine dependence, cigarettes, uncomplicated: Secondary | ICD-10-CM | POA: Diagnosis not present

## 2016-08-05 DIAGNOSIS — Z8249 Family history of ischemic heart disease and other diseases of the circulatory system: Secondary | ICD-10-CM | POA: Diagnosis not present

## 2016-08-05 DIAGNOSIS — I480 Paroxysmal atrial fibrillation: Secondary | ICD-10-CM | POA: Insufficient documentation

## 2016-08-05 DIAGNOSIS — G4733 Obstructive sleep apnea (adult) (pediatric): Secondary | ICD-10-CM | POA: Insufficient documentation

## 2016-08-05 DIAGNOSIS — Z91041 Radiographic dye allergy status: Secondary | ICD-10-CM | POA: Insufficient documentation

## 2016-08-05 DIAGNOSIS — Z79899 Other long term (current) drug therapy: Secondary | ICD-10-CM | POA: Insufficient documentation

## 2016-08-05 DIAGNOSIS — Z888 Allergy status to other drugs, medicaments and biological substances status: Secondary | ICD-10-CM | POA: Insufficient documentation

## 2016-08-05 DIAGNOSIS — Z955 Presence of coronary angioplasty implant and graft: Secondary | ICD-10-CM | POA: Insufficient documentation

## 2016-08-05 DIAGNOSIS — Z833 Family history of diabetes mellitus: Secondary | ICD-10-CM | POA: Insufficient documentation

## 2016-08-05 DIAGNOSIS — Z7901 Long term (current) use of anticoagulants: Secondary | ICD-10-CM | POA: Diagnosis not present

## 2016-08-05 DIAGNOSIS — M1990 Unspecified osteoarthritis, unspecified site: Secondary | ICD-10-CM | POA: Diagnosis not present

## 2016-08-05 DIAGNOSIS — I2510 Atherosclerotic heart disease of native coronary artery without angina pectoris: Secondary | ICD-10-CM | POA: Insufficient documentation

## 2016-08-05 DIAGNOSIS — Z7982 Long term (current) use of aspirin: Secondary | ICD-10-CM | POA: Diagnosis not present

## 2016-08-05 NOTE — Progress Notes (Signed)
Patient ID: ARDIN NIEL, male   DOB: 13-Jun-1941, 75 y.o.   MRN: [id number]     Primary Care Physician: [name], MD Referring Physician: Dr. Debara Pickett Cardiologist: Dr. Debarah Crape Rodney [name] is a 75 y.o. male with a h/o PAF on Tikosyn 250 mg bid, CAD s/p stenting of bifurcation stenosis involving the circumflex, 02/25/15, AAA s/p stent repair, HTN, HLD.  Review of lifestyle revealed that pt was drinking 2 mixed drinks containing crown royal a night and is positive for snoring and apnea as witnessed by the wife.  He was counseled re alcohol and increased afib burden. He  had a sleep study with mild sleep apnea and had an appointment with Dr. Radford Pax for discussion of sleep hygiene measures.  He was found to have mild sleep apnea not requiring cpap.He has minimal caffeine use and  has had smoking cessation.. He has finiished  cardiac rehab and is not overweight. Plays golf weather requiring. He is compliant with eliquis and tikosyn. Chadsvasc score is at least 5.   He has had some breakthrough afib in past usually picked up in cardiac rehab for which he was asymptomatic. He denies any sensation of irregular heart beat. He does note some mild gum bleeding from movement of his partial, intermittently, is on asa for cad and eliquis for Afib. He may want to try holdiing fish oil to see if less bleeding.  4/7, back for f/u in afib for tikosyn use. Maintaining SR. No c/o chest pain,but still has some gum bleeding where irritated with his partial. He avoids wearing for most part. He states he will require more dental surgery in the near future for lower teeth.  In afib clinic for f/u, he had an episode of afib 8/18 and went to the ER. He woke up at 4:30 am and was aware of irregular heart beat. After an hour of afib, he proceeded to the ER. He was recently evaluated at the [facility] and wore a 10 day monitor without any evidence of afib but states he was told next time he had afib to go to ER. Rate was  not rapid and he was not that uncomfortable. He also wanted to go because he had a rash and was at the tail end of a prednisone taper as well as receiving cortisone shot and he wanted that evaluated as well. He returned to afib with in the hour of ER visit. Otherwise, he has afib only about once every 4 months and will return to SR within 1-2 hours. Discussed ablation but with current low afib burden, he is happy to wait on this approach.He has stopped drinking alcohol and is playing golf several times a week.Echo repeated in May shows an improvement in EF to 45-50 % from previous 35%.  Today, he denies symptoms of palpitations, chest pain, shortness of breath, orthopnea, PND, lower extremity edema, dizziness, presyncope, syncope, or neurologic sequela. The patient is tolerating medications without difficulties and is otherwise without complaint today.   Past Medical History:  Diagnosis Date  . AAA (abdominal aortic aneurysm) ([place])    stent graft put in 02/2009  . Arthritis   . CAD (coronary artery disease)    a. cath 2010 b. CAD s/p PCI to ramus and LCX on 03/04/15  . Gout   . Hyperlipidemia   . Hypertension   . OSA (obstructive sleep apnea) 09/30/2015   Very mild with AHI 6.2/hr  . Paroxysmal atrial fibrillation (HCC)   . Tobacco  abuse    Past Surgical History:  Procedure Laterality Date  . ABDOMINAL AORTIC ANEURYSM REPAIR  02/19/2009   performed by [facility]  . COLONOSCOPY    . KNEE ARTHROSCOPY  2003   left  . LEFT AND RIGHT HEART CATHETERIZATION WITH CORONARY ANGIOGRAM N/A 03/03/2015   Procedure: LEFT AND RIGHT HEART CATHETERIZATION WITH CORONARY ANGIOGRAM;  Surgeon: Troy Sine, MD;    . PERCUTANEOUS CORONARY STENT INTERVENTION (PCI-S) N/A 03/04/2015   Procedure: PERCUTANEOUS CORONARY STENT INTERVENTION (PCI-S);  Surgeon: Troy Sine, MD; RI 99>>0% w/   BMS, bifurcation CFX-OM 90>>40% w/ Angiosculpt PTCA; dCFX 90>>0% w/   2.7520 mm Rebel BMS  . SHOULDER ARTHROSCOPY WITH ROTATOR CUFF  REPAIR AND SUBACROMIAL DECOMPRESSION Left 11/19/2013   Procedure: LEFT SHOULDER ARTHROSCOPY DEBRIDEMENT EXTENTSIVE DISTAL CLAVICULECTOMY DECOMPRESSION PARTIAL ACROMIOPLASTY WITH CORACOACROMIAL WITH ROTATOR CUFF REPAIR ;  Surgeon: Renette Butters, MD;  Location: [place];  Service: Orthopedics;  Laterality: Left;    Current Outpatient Prescriptions  Medication Sig Dispense Refill  . amLODipine (NORVASC) 10 MG tablet TAKE 1 TABLET BY MOUTH EVERY DAY 30 tablet 6  . apixaban (ELIQUIS) 5 MG TABS tablet Take 1 tablet (5 mg total) by mouth 2 (two) times daily. 180 tablet 3  . aspirin EC 81 MG tablet Take 81 mg by mouth daily.    Marland Kitchen atorvastatin (LIPITOR) 80 MG tablet Take 1 tablet (80 mg total) by mouth daily. 90 tablet 3  . carvedilol (COREG) 6.25 MG tablet Take 1 tablet (6.25 mg total) by mouth 2 (two) times daily with a meal. 60 tablet 11  . diltiazem (CARDIZEM) 30 MG tablet Take 1 tablet every 4 hours AS NEEDED for irregular HR>100 and BP>100 30 tablet 1  . dofetilide (TIKOSYN) 250 MCG capsule Take 1 capsule (250 mcg total) by mouth 2 (two) times daily. 60 capsule 5  . famotidine (PEPCID) 40 MG tablet Take 1 tablet (40 mg total) by mouth 2 (two) times daily. 180 tablet 1  . fish oil-omega-3 fatty acids 1000 MG capsule Take 1 g by mouth daily.     . furosemide (LASIX) 20 MG tablet Take 1 tablet (20 mg total) by mouth daily. 30 tablet 11  . irbesartan (AVAPRO) 300 MG tablet Take 1 tablet (300 mg total) by mouth daily. 30 tablet 11  . isosorbide mononitrate (IMDUR) 60 MG 24 hr tablet TAKE 1 TABLET BY MOUTH EVERY DAY 30 tablet 10  . KLOR-CON M10 10 MEQ tablet TAKE 1 TABLET EVERY DAY 90 tablet 3  . loratadine (CLARITIN) 10 MG tablet Take 10 mg by mouth daily as needed for allergies.    . Multiple Vitamin (MULTIVITAMIN WITH MINERALS) TABS tablet Take 1 tablet by mouth daily.    . nitroGLYCERIN (NITROSTAT) 0.4 MG SL tablet Place 1 tablet (0.4 mg total) under the tongue every 5 (five)  minutes as needed for chest pain (CP or SOB). 25 tablet 12   No current facility-administered medications for this encounter.     Allergies  Allergen Reactions  . Iohexol      Code: RASH, Desc: PAITENT STATED THAT HE BEGAN ITCHING TOWARDS END OF INJECTIN OF IV CONTRAST-- 13 HR PREP RECOMMENDED/MMS   . Lisinopril Cough    Social History   Social History  . Marital status: Married    Spouse name: N/A  . Number of children: N/A  . Years of education: N/A   Occupational History  . Not on file.   Social History Main Topics  .  Smoking status: Former Smoker    Packs/day: 0.50    Years: 50.00    Types: Cigarettes  . Smokeless tobacco: Never Used  . Alcohol use 4.2 - 4.8 oz/week    6 Cans of beer, 1 - 2 Shots of liquor per week     Comment: couple shots of royal per day  . Drug use: No  . Sexual activity: Not Currently     Comment: cutting down 1/2 ppd   Other Topics Concern  . Not on file   Social History Narrative   Lives with wife, does not use assist, drives.  No home health services.      Family History  Problem Relation Age of Onset  . Prostate cancer Father   . Hypertension Mother   . Diabetes Sister   . Hypertension Sister   . Stroke Neg Hx   . Heart attack Neg Hx     ROS- All systems are reviewed and negative except as per the HPI above  Physical Exam: Vitals:   08/05/16 0838  BP: 138/74  Pulse: (!) 57  Weight: 216 lb 3.2 oz (98.1 kg)  Height: 6\' 2"  (1.88 m)    GEN- The patient is well appearing, alert and oriented x 3 today.   Head- normocephalic, atraumatic Eyes-  Sclera clear, conjunctiva pink Ears- hearing intact Oropharynx- clear Neck- supple, no JVP Lymph- no cervical lymphadenopathy Lungs- Clear to ausculation bilaterally, normal work of breathing Heart-Regular rate and rhythm, no murmurs, rubs or gallops, PMI not laterally displaced GI- soft, NT, ND, + BS Extremities- no clubbing, cyanosis, or edema MS- no significant deformity or  atrophy Skin- no rash or lesion Psych- euthymic mood, full affect Neuro- strength and sensation are intact  EKG-Sinus brady at 57 bpm, NS st/t wave abnormality, Qtc 475 ms  Echo- Repeated in May- - Left ventricle: The cavity size was mildly dilated. Wall   thickness was increased in a pattern of mild LVH. Systolic   function was mildly reduced. The estimated ejection fraction was   in the range of 45% to 50%. Diffuse hypokinesis. Doppler   parameters are consistent with abnormal left ventricular   relaxation (grade 1 diastolic dysfunction). - Aortic root: The aortic root was mildly dilated. - Left atrium: The atrium was mildly dilated. 43 mm - Pulmonary arteries: Systolic pressure was mildly to moderately   increased. PA peak pressure: 44 mm Hg (S).  Impressions:  - Mild global reduction in LV function; grade 1 diastolic   dysfunction; mild LVH and LVE; mild LAE; trace MR and TR; mild to   moderate elevation in pulmonary pressure.  Epic records reviewed.   Assessment and Plan:  1.  Asymptomatic PAF For most part, low afib burden,staying in rhythm with tikosyn and carvedilol, qtc stable Recent afib in the setting of prednisone, ER visit but pt converted within an hour Discussed when appropriate to go to there ER with afib and how to manage at home Continue apixaban Discussed ablation but afib burden  still low and pt is not interested at this time Mag to be drawn today, K+ checked in the ER was at 4.0, creatinine  at 1.23  2. Lifestyle factors contributing to afib burden Congratulated on stopping drinking Continue with smoking cessation   Recent sleep study with mild sleep apnea, no cpap required Continue exercise efforts   3. CAD Appears stable Continue asa,stain, bb, nitro   4. HTN Stable Continue amlodipine, irbesartan, lasix  F/u 3 months in afib clinic  for monitoring of tiksoyn F/u with Dr. Debara Pickett as scheduled   Rodney [name], [facility] [address] [place], [place] [address] [phone]

## 2016-08-26 ENCOUNTER — Encounter: Payer: Self-pay | Admitting: Family

## 2016-09-01 ENCOUNTER — Ambulatory Visit (HOSPITAL_COMMUNITY)
Admission: RE | Admit: 2016-09-01 | Discharge: 2016-09-01 | Disposition: A | Discharge status: Home / Self Care | Payer: Medicare Other | Source: Ambulatory Visit | Attending: Family | Admitting: Family

## 2016-09-01 ENCOUNTER — Ambulatory Visit (INDEPENDENT_AMBULATORY_CARE_PROVIDER_SITE_OTHER): Payer: Medicare Other | Admitting: Family

## 2016-09-01 ENCOUNTER — Encounter: Payer: Self-pay | Admitting: Family

## 2016-09-01 VITALS — BP 129/74 | HR 55 | Temp 97.1°F | Resp 20 | Ht 74.0 in | Wt 218.8 lb

## 2016-09-01 DIAGNOSIS — I714 Abdominal aortic aneurysm, without rupture, unspecified: Secondary | ICD-10-CM

## 2016-09-01 DIAGNOSIS — T82330S Leakage of aortic (bifurcation) graft (replacement), sequela: Secondary | ICD-10-CM | POA: Diagnosis not present

## 2016-09-01 DIAGNOSIS — Z95828 Presence of other vascular implants and grafts: Secondary | ICD-10-CM | POA: Insufficient documentation

## 2016-09-01 DIAGNOSIS — X58XXXA Exposure to other specified factors, initial encounter: Secondary | ICD-10-CM | POA: Diagnosis not present

## 2016-09-01 DIAGNOSIS — IMO0001 Reserved for inherently not codable concepts without codable children: Secondary | ICD-10-CM

## 2016-09-01 NOTE — Patient Instructions (Signed)

## 2016-09-01 NOTE — Progress Notes (Signed)
[facility] [place]  CC: Follow up s/p EVAR  History of Present Illness  Rodney [name] is a 75 y.o. ([birth year]) male patient of Dr. Kellie Simmering who is seen for continued follow-up regarding his abdominal aortic aneurysm stent graft repair in 2010. He denies any abdominal or back symptoms. He did have some cardiac stents placed in early 2016 by Dr. Claiborne Billings and is doing well from that standpoint. He is currently on aspirin and Eliquis, has a hx of paroxysmal atrial fib. He also takes a daily statin. Dr. Kellie Simmering last saw pt on 07/28/15. At that time duple demonstarted contraction of extra stent aneurysmal sac and no evidence of endoleak.  He denies claudication symptoms with walking. He exercises in a gym 3x/week. He denies any known history of stroke or TIA.    Pt Diabetic: No Pt smoker: smoker  (1/3 ppd, started at age 82 yrs)   Past Medical History:  Diagnosis Date  . AAA (abdominal aortic aneurysm) ([place])    stent graft put in 02/2009  . Arthritis   . CAD (coronary artery disease)    a. cath 2010 b. CAD s/p PCI to ramus and LCX on 03/04/15  . Gout   . Hyperlipidemia   . Hypertension   . OSA (obstructive sleep apnea) 09/30/2015   Very mild with AHI 6.2/hr  . Paroxysmal atrial fibrillation (HCC)   . Tobacco abuse    Past Surgical History:  Procedure Laterality Date  . ABDOMINAL AORTIC ANEURYSM REPAIR  02/19/2009   performed by [facility]  . COLONOSCOPY    . KNEE ARTHROSCOPY  2003   left  . LEFT AND RIGHT HEART CATHETERIZATION WITH CORONARY ANGIOGRAM N/A 03/03/2015   Procedure: LEFT AND RIGHT HEART CATHETERIZATION WITH CORONARY ANGIOGRAM;  Surgeon: Troy Sine, MD;    . PERCUTANEOUS CORONARY STENT INTERVENTION (PCI-S) N/A 03/04/2015   Procedure: PERCUTANEOUS CORONARY STENT INTERVENTION (PCI-S);  Surgeon: Troy Sine, MD; RI 99>>0% w/   BMS, bifurcation CFX-OM 90>>40% w/ Angiosculpt PTCA; dCFX 90>>0% w/   2.7520 mm Rebel BMS  . SHOULDER ARTHROSCOPY WITH ROTATOR CUFF  REPAIR AND SUBACROMIAL DECOMPRESSION Left 11/19/2013   Procedure: LEFT SHOULDER ARTHROSCOPY DEBRIDEMENT EXTENTSIVE DISTAL CLAVICULECTOMY DECOMPRESSION PARTIAL ACROMIOPLASTY WITH CORACOACROMIAL WITH ROTATOR CUFF REPAIR ;  Surgeon: Renette Butters, MD;  Location: [place];  Service: Orthopedics;  Laterality: Left;   Social History Social History  Substance Use Topics  . Smoking status: Former Smoker    Packs/day: 0.50    Years: 50.00    Types: Cigarettes    Quit date: 03/01/2016  . Smokeless tobacco: Never Used  . Alcohol use 4.2 - 4.8 oz/week    6 Cans of beer, 1 - 2 Shots of liquor per week     Comment: couple shots of royal per day   Family History Family History  Problem Relation Age of Onset  . Prostate cancer Father   . Hypertension Mother   . Diabetes Sister   . Hypertension Sister   . Stroke Neg Hx   . Heart attack Neg Hx    Current Outpatient Prescriptions on File Prior to Visit  Medication Sig Dispense Refill  . amLODipine (NORVASC) 10 MG tablet TAKE 1 TABLET BY MOUTH EVERY DAY 30 tablet 6  . apixaban (ELIQUIS) 5 MG TABS tablet Take 1 tablet (5 mg total) by mouth 2 (two) times daily. 180 tablet 3  . aspirin EC 81 MG tablet Take 81 mg by mouth daily.    Marland Kitchen  atorvastatin (LIPITOR) 80 MG tablet Take 1 tablet (80 mg total) by mouth daily. 90 tablet 3  . carvedilol (COREG) 6.25 MG tablet Take 1 tablet (6.25 mg total) by mouth 2 (two) times daily with a meal. 60 tablet 11  . diltiazem (CARDIZEM) 30 MG tablet Take 1 tablet every 4 hours AS NEEDED for irregular HR>100 and BP>100 30 tablet 1  . dofetilide (TIKOSYN) 250 MCG capsule Take 1 capsule (250 mcg total) by mouth 2 (two) times daily. 60 capsule 5  . famotidine (PEPCID) 40 MG tablet Take 1 tablet (40 mg total) by mouth 2 (two) times daily. 180 tablet 1  . fish oil-omega-3 fatty acids 1000 MG capsule Take 1 g by mouth daily.     . furosemide (LASIX) 20 MG tablet Take 1 tablet (20 mg total) by mouth daily. 30  tablet 11  . irbesartan (AVAPRO) 300 MG tablet Take 1 tablet (300 mg total) by mouth daily. 30 tablet 11  . isosorbide mononitrate (IMDUR) 60 MG 24 hr tablet TAKE 1 TABLET BY MOUTH EVERY DAY 30 tablet 10  . KLOR-CON M10 10 MEQ tablet TAKE 1 TABLET EVERY DAY 90 tablet 3  . loratadine (CLARITIN) 10 MG tablet Take 10 mg by mouth daily as needed for allergies.    . Multiple Vitamin (MULTIVITAMIN WITH MINERALS) TABS tablet Take 1 tablet by mouth daily.    . nitroGLYCERIN (NITROSTAT) 0.4 MG SL tablet Place 1 tablet (0.4 mg total) under the tongue every 5 (five) minutes as needed for chest pain (CP or SOB). 25 tablet 12   No current facility-administered medications on file prior to visit.    Allergies  Allergen Reactions  . Iohexol      Code: RASH, Desc: PAITENT STATED THAT HE BEGAN ITCHING TOWARDS END OF INJECTIN OF IV CONTRAST-- 13 HR PREP RECOMMENDED/MMS   . Lisinopril Cough     ROS: See HPI for pertinent positives and negatives.  Physical Examination  Vitals:   09/01/16 1025  BP: 129/74  Pulse: (!) 55  Resp: 20  Temp: 97.1 F (36.2 C)  TempSrc: Oral  Weight: 218 lb 12.8 oz (99.2 kg)  Height: 6\' 2"  (1.88 m)   Body mass index is 28.09 kg/m.  General: A&O x 3, WD  Pulmonary: Sym exp, respirations are non labored, good air movt in all fields, CTAB  Cardiac: Regular rhythm, bradycardic rate (on a beta blocker), Nl S1, S2, no murmur appreciated  Vascular: Vessel Right Left  Radial 2+Palpable 2+Palpable  Carotid  without bruit  without bruit  Aorta Not palpable N/A  Femoral 2+Palpable 2+Palpable  Popliteal 2+palpable 2+ palpable  PT Faintly Palpable Faintly Palpable  DP Faintly Palpable Faintly Palpable   Gastrointestinal: soft, NTND, -G/R, - HSM, - palpable masses, - CVAT B.  Musculoskeletal: M/S 5/5 throughout, extremities without ischemic changes.  Neurologic: Pain and light touch intact in extremities, Motor exam as listed above  Non-Invasive Vascular  Imaging  EVAR Duplex (Date: 09/01/16)  AAA sac size: 3.4 cm x 3.3 cm  no endoleak detected   Medical Decision Making  [name] is a 75 y.o. male who presents s/p EVAR (Date: 2010).  Pt is asymptomatic with stable sac size.  The patient was counseled re smoking cessation and given several free resources re smoking cessation.    I discussed with the patient the importance of surveillance of the endograft.  The next endograft duplex will be scheduled for 12 months.  The patient will follow up with [place]  in 12 months with these studies.  I emphasized the importance of maximal medical management including strict control of blood pressure, blood glucose, and lipid levels, antiplatelet agents, obtaining regular exercise, and cessation of smoking.   Thank you for allowing [place] to participate in this patient's care.  Clemon Chambers, RN, MSN, FNP-C [facility] Office: [facility] Physician: Oneida Alar  09/01/2016, 10:44 AM

## 2016-09-07 ENCOUNTER — Other Ambulatory Visit: Payer: Self-pay | Admitting: Internal Medicine

## 2016-10-02 ENCOUNTER — Other Ambulatory Visit: Payer: Self-pay

## 2016-10-02 ENCOUNTER — Encounter (HOSPITAL_COMMUNITY): Payer: Self-pay | Admitting: Emergency Medicine

## 2016-10-02 ENCOUNTER — Emergency Department (HOSPITAL_COMMUNITY)
Admission: EM | Admit: 2016-10-02 | Discharge: 2016-10-02 | Disposition: A | Discharge status: Home / Self Care | Payer: Non-veteran care | Attending: Emergency Medicine | Admitting: Emergency Medicine

## 2016-10-02 ENCOUNTER — Emergency Department (HOSPITAL_COMMUNITY): Payer: Non-veteran care

## 2016-10-02 DIAGNOSIS — Z7982 Long term (current) use of aspirin: Secondary | ICD-10-CM | POA: Insufficient documentation

## 2016-10-02 DIAGNOSIS — I110 Hypertensive heart disease with heart failure: Secondary | ICD-10-CM | POA: Insufficient documentation

## 2016-10-02 DIAGNOSIS — Z87891 Personal history of nicotine dependence: Secondary | ICD-10-CM | POA: Insufficient documentation

## 2016-10-02 DIAGNOSIS — Z79899 Other long term (current) drug therapy: Secondary | ICD-10-CM | POA: Diagnosis not present

## 2016-10-02 DIAGNOSIS — Z8673 Personal history of transient ischemic attack (TIA), and cerebral infarction without residual deficits: Secondary | ICD-10-CM | POA: Diagnosis not present

## 2016-10-02 DIAGNOSIS — I2510 Atherosclerotic heart disease of native coronary artery without angina pectoris: Secondary | ICD-10-CM | POA: Insufficient documentation

## 2016-10-02 DIAGNOSIS — I5042 Chronic combined systolic (congestive) and diastolic (congestive) heart failure: Secondary | ICD-10-CM | POA: Insufficient documentation

## 2016-10-02 DIAGNOSIS — I4891 Unspecified atrial fibrillation: Secondary | ICD-10-CM | POA: Insufficient documentation

## 2016-10-02 LAB — BASIC METABOLIC PANEL
ANION GAP: 9 (ref 5–15)
BUN: 10 mg/dL (ref 6–20)
CALCIUM: 8.6 mg/dL — AB (ref 8.9–10.3)
CO2: 20 mmol/L — AB (ref 22–32)
Chloride: 109 mmol/L (ref 101–111)
Creatinine, Ser: 1.14 mg/dL (ref 0.61–1.24)
GFR calc Af Amer: >60 mL/min (ref >60)
GFR calc non Af Amer: >60 mL/min (ref >60)
GLUCOSE: 143 mg/dL — AB (ref 65–99)
POTASSIUM: 3.5 mmol/L (ref 3.5–5.1)
Sodium: 138 mmol/L (ref 135–145)

## 2016-10-02 LAB — CBC
HEMATOCRIT: 44.3 % (ref 39.0–52.0)
HEMOGLOBIN: 15 g/dL (ref 13.0–17.0)
MCH: 31.1 pg (ref 26.0–34.0)
MCHC: 33.9 g/dL (ref 30.0–36.0)
MCV: 91.9 fL (ref 78.0–100.0)
Platelets: 142 10*3/uL — ABNORMAL LOW (ref 150–400)
RBC: 4.82 MIL/uL (ref 4.22–5.81)
RDW: 13.4 % (ref 11.5–15.5)
WBC: 4.7 10*3/uL (ref 4.0–10.5)

## 2016-10-02 LAB — I-STAT TROPONIN, ED: Troponin i, poc: 0 ng/mL (ref 0.00–0.08)

## 2016-10-02 MED ORDER — DILTIAZEM HCL 25 MG/5ML IV SOLN
10.0000 mg | Freq: Once | INTRAVENOUS | Status: AC
  Administered 2016-10-02: 10 mg via INTRAVENOUS
  Filled 2016-10-02: qty 5

## 2016-10-02 NOTE — ED Provider Notes (Signed)
[facility] DEPT Provider Note   CSN: [id number] Arrival date & time: 10/02/16  0115  By signing my name below, I, Royce Macadamia, attest that this documentation has been prepared under the direction and in the presence of Dorie Rank, MD . Electronically Signed: Royce Macadamia, Bray. 10/02/2016. 2:00 AM.  History   Chief Complaint Chief Complaint  Patient presents with  . Atrial Fibrillation  . Chest Pain   The history is provided by the patient and medical records. No language interpreter was used.     HPI Comments:  Rodney [name] is a 75 y.o. male with a history of paroxysmal atrial fibrillation who presents to the Emergency Department complaining of a heart rate over 100 tonight which is abnormal for him.  He also notes chest tightness and states he doesn't normally have chest tightness with is A-fib.  Pt hasn't had an episode of A-fib since August; normally he is not in A-fib.  His episodes have always relieved on their own; usually before he can be seen by a physician.  He sees his cardiologist Dr. Debara Pickett every 3 months.  Pt is on Tikosyn, Eliquis and Asprin.  He is prescribed Cardizem to be used as needed for A-fib episodes but states he's never taken it. Pt denies a feeling of heart racing, fever, chills and SOB.    Past Medical History:  Diagnosis Date  . AAA (abdominal aortic aneurysm) ([place])    stent graft put in 02/2009  . Arthritis   . CAD (coronary artery disease)    a. cath 2010 b. CAD s/p PCI to ramus and LCX on 03/04/15  . Gout   . Hyperlipidemia   . Hypertension   . OSA (obstructive sleep apnea) 09/30/2015   Very mild with AHI 6.2/hr  . Paroxysmal atrial fibrillation (HCC)   . Tobacco abuse     Patient Active Problem List   Diagnosis Date Noted  . H/O endovascular stent graft for abdominal aortic aneurysm 09/01/2016  . Cardiomyopathy, ischemic 03/30/2016  . OSA (obstructive sleep apnea) 09/30/2015  . Encounter for monitoring anti-arrhythmic  therapy 05/04/2015  . DOE (dyspnea on exertion) 03/16/2015  . Atrial fibrillation with RVR ([place]) 03/16/2015  . Hypertensive urgency 03/08/2015  . Coronary artery disease due to lipid rich plaque   . AA (aortic aneurysm) ([place])   . Dyspnea 03/02/2015  . Progressive angina ([place]) 03/02/2015  . History of TIA (transient ischemic attack), 02/20/15 03/02/2015  . Mitral regurgitation 03/02/2015  . HX: anticoagulation 03/02/2015  . Chest tightness   . Tobacco abuse   . HLD (hyperlipidemia)   . TIA (transient ischemic attack) 02/20/2015  . 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 Chronic combined systolic and diastolic heart failure, NYHA class 1 ([place]) 02/20/2015  . PAF (paroxysmal atrial fibrillation) ([employer]) 01/29/2015  . Accelerated hypertension, hx of 01/03/2015  . Acute diastolic CHF (congestive heart failure), NYHA class 2 ([place]) 01/03/2015  . Demand ischemia ([place]) 01/03/2015  . CAD (coronary artery disease) stent place 03/04/15 01/03/2015  . Troponin level elevated   . Thrombocytopenia ([place])   . Essential hypertension   . S/P abdominal aortic aneurysm repair 07/03/2012    Past Surgical History:  Procedure Laterality Date  . ABDOMINAL AORTIC ANEURYSM REPAIR  02/19/2009   performed by [facility]  . COLONOSCOPY    . KNEE ARTHROSCOPY  2003   left  . LEFT AND RIGHT HEART CATHETERIZATION WITH CORONARY ANGIOGRAM N/A 03/03/2015   Procedure: LEFT AND RIGHT HEART CATHETERIZATION WITH CORONARY ANGIOGRAM;  Surgeon: Joyice Faster  Claiborne Billings, MD;    . PERCUTANEOUS CORONARY STENT INTERVENTION (PCI-S) N/A 03/04/2015   Procedure: PERCUTANEOUS CORONARY STENT INTERVENTION (PCI-S);  Surgeon: Troy Sine, MD; RI 99>>0% w/   BMS, bifurcation CFX-OM 90>>40% w/ Angiosculpt PTCA; dCFX 90>>0% w/   2.7520 mm Rebel BMS  . SHOULDER ARTHROSCOPY WITH ROTATOR CUFF REPAIR AND SUBACROMIAL DECOMPRESSION Left 11/19/2013   Procedure: LEFT SHOULDER ARTHROSCOPY DEBRIDEMENT EXTENTSIVE DISTAL CLAVICULECTOMY DECOMPRESSION PARTIAL ACROMIOPLASTY WITH  CORACOACROMIAL WITH ROTATOR CUFF REPAIR ;  Surgeon: Renette Butters, MD;  Location: [facility];  Service: Orthopedics;  Laterality: Left;       Home Medications    Prior to Admission medications   Medication Sig Start Date End Date Taking? Authorizing Provider  amLODipine (NORVASC) 10 MG tablet TAKE 1 TABLET BY MOUTH EVERY DAY 09/07/16  Yes [name], MD  apixaban (ELIQUIS) 5 MG TABS tablet Take 1 tablet (5 mg total) by mouth 2 (two) times daily. 05/26/15  Yes [name], MD  aspirin EC 81 MG tablet Take 81 mg by mouth daily.   Yes Historical Provider, MD  atorvastatin (LIPITOR) 80 MG tablet Take 1 tablet (80 mg total) by mouth daily. 05/21/15  Yes [name], MD  carvedilol (COREG) 6.25 MG tablet Take 1 tablet (6.25 mg total) by mouth 2 (two) times daily with a meal. 03/30/16  Yes [name], MD  diltiazem (CARDIZEM) 30 MG tablet Take 1 tablet every 4 hours AS NEEDED for irregular HR>100 and BP>100 07/27/15  Yes Sherran Needs, NP  dofetilide (TIKOSYN) 250 MCG capsule Take 1 capsule (250 mcg total) by mouth 2 (two) times daily. 03/20/15  Yes Brittainy Erie Noe, PA-C  famotidine (PEPCID) 40 MG tablet Take 1 tablet (40 mg total) by mouth 2 (two) times daily. 10/19/15  Yes [name], MD  fish oil-omega-3 fatty acids 1000 MG capsule Take 1 g by mouth daily.    Yes Historical Provider, MD  furosemide (LASIX) 20 MG tablet Take 1 tablet (20 mg total) by mouth daily. 03/30/16  Yes [name], MD  irbesartan (AVAPRO) 300 MG tablet Take 1 tablet (300 mg total) by mouth daily. 03/03/16  Yes [name], MD  isosorbide mononitrate (IMDUR) 60 MG 24 hr tablet TAKE 1 TABLET BY MOUTH EVERY DAY 04/25/16  Yes [name], MD  loratadine (CLARITIN) 10 MG tablet Take 10 mg by mouth daily as needed for allergies.   Yes Historical Provider, MD  Multiple Vitamin (MULTIVITAMIN WITH MINERALS) TABS tablet Take 1 tablet by mouth daily.   Yes Historical Provider, MD    nitroGLYCERIN (NITROSTAT) 0.4 MG SL tablet Place 1 tablet (0.4 mg total) under the tongue every 5 (five) minutes as needed for chest pain (CP or SOB). 03/09/15  Yes Brett Canales, PA-C  KLOR-CON M10 10 MEQ tablet TAKE 1 TABLET EVERY DAY 02/03/16   [name], MD    Family History Family History  Problem Relation Age of Onset  . Prostate cancer Father   . Hypertension Mother   . Diabetes Sister   . Hypertension Sister   . Stroke Neg Hx   . Heart attack Neg Hx     Social History Social History  Substance Use Topics  . Smoking status: Former Smoker    Packs/day: 0.50    Years: 50.00    Types: Cigarettes    Quit date: 03/01/2016  . Smokeless tobacco: Never Used  . Alcohol use 4.2 - 4.8 oz/week  6 Cans of beer, 1 - 2 Shots of liquor per week     Comment: couple shots of royal per day     Allergies   Iohexol and Lisinopril   Review of Systems Review of Systems  All other systems reviewed and are negative.    Physical Exam Updated Vital Signs BP 131/85   Pulse 73   Temp 97.4 F (36.3 C) (Oral)   Resp 17   Ht 6\' 2"  (1.88 m)   Wt 98.4 kg   SpO2 96%   BMI 27.86 kg/m   Physical Exam  Constitutional: He appears well-developed and well-nourished. No distress.  HENT:  Head: Normocephalic and atraumatic.  Right Ear: External ear normal.  Left Ear: External ear normal.  Eyes: Conjunctivae are normal. Right eye exhibits no discharge. Left eye exhibits no discharge. No scleral icterus.  Neck: Neck supple. No tracheal deviation present.  Cardiovascular: Intact distal pulses.  An irregularly irregular rhythm present. Tachycardia present.   Pulmonary/Chest: Effort normal and breath sounds normal. No stridor. No respiratory distress. He has no wheezes. He has no rales.  Abdominal: Soft. Bowel sounds are normal. He exhibits no distension. There is no tenderness. There is no rebound and no guarding.  Musculoskeletal: He exhibits no edema or tenderness.  Neurological: He  is alert. He has normal strength. No cranial nerve deficit (no facial droop, extraocular movements intact, no slurred speech) or sensory deficit. He exhibits normal muscle tone. He displays no seizure activity. Coordination normal.  Skin: Skin is warm and dry. No rash noted.  Psychiatric: He has a normal mood and affect.  Nursing note and vitals reviewed.    ED Treatments / Results   DIAGNOSTIC STUDIES:  Oxygen Saturation is 96% on RA, NML by my interpretation.    COORDINATION OF CARE:  1:53 AM Will give Cardizem and order cardiac workup.  Discussed treatment plan with pt at bedside and pt agreed to plan.  Labs (all labs ordered are listed, but only abnormal results are displayed) Labs Reviewed  BASIC METABOLIC PANEL - Abnormal; Notable for the following:       Result Value   CO2 20 (*)    Glucose, Bld 143 (*)    Calcium 8.6 (*)    All other components within normal limits  CBC - Abnormal; Notable for the following:    Platelets 142 (*)    All other components within normal limits  I-STAT TROPOININ, ED    EKG  EKG Interpretation  Date/Time:  Sunday October 02 2016 01:19:38 EDT Ventricular Rate:  108 PR Interval:    QRS Duration: 92 QT Interval:  362 QTC Calculation: 485 R Axis:   20 Text Interpretation:  Atrial fibrillation with rapid ventricular response Nonspecific ST and T wave abnormality Abnormal ECG a fib is new since last tracing Confirmed by Deasiah Hagberg  MD-J, Kitty Cadavid [id number]) on 10/02/2016 1:29:16 AM       Radiology Dg Chest 2 View  Result Date: 10/02/2016 CLINICAL DATA:  75 year old male with chest pain EXAM: CHEST  2 VIEW COMPARISON:  Chest radiograph dated 07/29/2016 FINDINGS: Minimal left lung base atelectatic changes/scarring. No focal consolidation, pleural effusion, or pneumothorax. The cardiac silhouette is within normal limits. Degenerative changes of the spine. No acute fracture. Partially visualized endovascular stent graft repair of the abdominal aorta.  IMPRESSION: No active cardiopulmonary disease. Electronically Signed   By: Anner Crete M.D.   On: 10/02/2016 02:30    Procedures Procedures (including critical care time)  Medications Ordered  in ED Medications  diltiazem (CARDIZEM) injection 10 mg (10 mg Intravenous Given 10/02/16 0254)     Initial Impression / Assessment and Plan / ED Course  I have reviewed the triage vital signs and the nursing notes.  Pertinent labs & imaging results that were available during my care of the patient were reviewed by me and considered in my medical decision making (see chart for details).  Clinical Course     3:14 AM Discussed option of cardioversion in the ED vs close outpatient follow up. Pt would prefer to follow up with his cardiologist.  He does of a cardizem rx to use prn rapid heart rate.  He was given dose of cardizem in the ED and the rate is in the 90s.  Chadsvasc 3 on eliquis.  Final Clinical Impressions(s) / ED Diagnoses   Final diagnoses:  Atrial fibrillation with RVR (HCC)    New Prescriptions New Prescriptions   No medications on file   I personally performed the services described in this documentation, which was scribed in my presence.  The recorded information has been reviewed and is accurate.    Dorie Rank, MD 10/02/16 [phone]

## 2016-10-02 NOTE — ED Triage Notes (Signed)
Patient arrives with complaint of Afib symptoms. States onset tonight while watching game. Endorses history of transient Afib. Tonight he began to feel some fluttering in his left chest coupled with some tightness. Pt measured his vitals and states his machine reported he was in Afib with a rate >100. Currently endorses mild chest pain with bilateral hand "tightness".

## 2016-10-02 NOTE — Discharge Instructions (Signed)
Follow-up with your cardiologist next week, take your Cardizem as needed for rapid heart rate, return as needed for worsening symptoms

## 2016-10-02 NOTE — ED Notes (Signed)
Patient transported to X-ray via stretcher 

## 2016-10-03 ENCOUNTER — Telehealth (HOSPITAL_COMMUNITY): Payer: Self-pay | Admitting: *Deleted

## 2016-10-03 NOTE — Telephone Encounter (Signed)
LMOM for pt to [facility].  Pt discharged from ED 10/02/16 with afib

## 2016-11-07 ENCOUNTER — Ambulatory Visit (HOSPITAL_COMMUNITY)
Admission: RE | Admit: 2016-11-07 | Discharge: 2016-11-07 | Disposition: A | Discharge status: Home / Self Care | Payer: Medicare Other | Source: Ambulatory Visit | Attending: Nurse Practitioner | Admitting: Nurse Practitioner

## 2016-11-07 ENCOUNTER — Other Ambulatory Visit (HOSPITAL_COMMUNITY): Payer: Self-pay | Admitting: *Deleted

## 2016-11-07 ENCOUNTER — Encounter (HOSPITAL_COMMUNITY): Payer: Self-pay | Admitting: Nurse Practitioner

## 2016-11-07 VITALS — BP 156/80 | HR 56 | Ht 74.0 in | Wt 222.4 lb

## 2016-11-07 DIAGNOSIS — Z823 Family history of stroke: Secondary | ICD-10-CM | POA: Insufficient documentation

## 2016-11-07 DIAGNOSIS — E785 Hyperlipidemia, unspecified: Secondary | ICD-10-CM | POA: Diagnosis not present

## 2016-11-07 DIAGNOSIS — Z87891 Personal history of nicotine dependence: Secondary | ICD-10-CM | POA: Diagnosis not present

## 2016-11-07 DIAGNOSIS — M109 Gout, unspecified: Secondary | ICD-10-CM | POA: Diagnosis not present

## 2016-11-07 DIAGNOSIS — Z7982 Long term (current) use of aspirin: Secondary | ICD-10-CM | POA: Insufficient documentation

## 2016-11-07 DIAGNOSIS — Z833 Family history of diabetes mellitus: Secondary | ICD-10-CM | POA: Diagnosis not present

## 2016-11-07 DIAGNOSIS — Z888 Allergy status to other drugs, medicaments and biological substances status: Secondary | ICD-10-CM | POA: Diagnosis not present

## 2016-11-07 DIAGNOSIS — Z8249 Family history of ischemic heart disease and other diseases of the circulatory system: Secondary | ICD-10-CM | POA: Insufficient documentation

## 2016-11-07 DIAGNOSIS — I480 Paroxysmal atrial fibrillation: Secondary | ICD-10-CM | POA: Insufficient documentation

## 2016-11-07 DIAGNOSIS — I714 Abdominal aortic aneurysm, without rupture: Secondary | ICD-10-CM | POA: Insufficient documentation

## 2016-11-07 DIAGNOSIS — G4733 Obstructive sleep apnea (adult) (pediatric): Secondary | ICD-10-CM | POA: Diagnosis not present

## 2016-11-07 DIAGNOSIS — Z8042 Family history of malignant neoplasm of prostate: Secondary | ICD-10-CM | POA: Insufficient documentation

## 2016-11-07 DIAGNOSIS — Z79899 Other long term (current) drug therapy: Secondary | ICD-10-CM | POA: Insufficient documentation

## 2016-11-07 DIAGNOSIS — Z9889 Other specified postprocedural states: Secondary | ICD-10-CM | POA: Insufficient documentation

## 2016-11-07 DIAGNOSIS — I10 Essential (primary) hypertension: Secondary | ICD-10-CM | POA: Diagnosis not present

## 2016-11-07 DIAGNOSIS — I2510 Atherosclerotic heart disease of native coronary artery without angina pectoris: Secondary | ICD-10-CM | POA: Insufficient documentation

## 2016-11-07 LAB — BASIC METABOLIC PANEL WITH GFR
Anion gap: 5 (ref 5–15)
BUN: 15 mg/dL (ref 6–20)
CO2: 26 mmol/L (ref 22–32)
Calcium: 9.1 mg/dL (ref 8.9–10.3)
Chloride: 110 mmol/L (ref 101–111)
Creatinine, Ser: 1.17 mg/dL (ref 0.61–1.24)
GFR calc Af Amer: >60 mL/min
GFR calc non Af Amer: 60 mL/min — ABNORMAL LOW
Glucose, Bld: 115 mg/dL — ABNORMAL HIGH (ref 65–99)
Potassium: 3.8 mmol/L (ref 3.5–5.1)
Sodium: 141 mmol/L (ref 135–145)

## 2016-11-07 LAB — MAGNESIUM: Magnesium: 2.3 mg/dL (ref 1.7–2.4)

## 2016-11-07 MED ORDER — POTASSIUM CHLORIDE CRYS ER 10 MEQ PO TBCR: 10.0000 meq | EXTENDED_RELEASE_TABLET | Freq: Two times a day (BID) | ORAL | 3 refills | Status: DC

## 2016-11-07 NOTE — Progress Notes (Signed)
Patient ID: Rodney [name], male   DOB: [birth year], 75 y.o.   MRN: [id number]     Primary Care Physician: [name], MD Referring Physician: Dr. Debara Pickett Cardiologist: Dr. Debarah Crape Rodney [name] is a 75 y.o. male with a h/o PAF on Tikosyn 250 mg bid, CAD s/p stenting of bifurcation stenosis involving the circumflex, 02/25/15, AAA s/p stent repair, HTN, HLD.  Review of lifestyle revealed that pt was drinking 2 mixed drinks containing crown royal a night and is positive for snoring and apnea as witnessed by the wife.  He was counseled re alcohol and increased afib burden. He  had a sleep study with mild sleep apnea and had an appointment with Dr. Radford Pax for discussion of sleep hygiene measures.  He was found to have mild sleep apnea not requiring cpap.He has minimal caffeine use and  has had smoking cessation. He has finiished  cardiac rehab and is not overweight. Plays golf weather requiring. He is compliant with eliquis and tikosyn. Chadsvasc score is at least 5.   He has had some breakthrough afib in past usually picked up in cardiac rehab for which he was asymptomatic. He denies any sensation of irregular heart beat. He does note some mild gum bleeding from movement of his partial, intermittently, is on asa for cad and eliquis for Afib. He may want to try holdiing fish oil to see if less bleeding.  4/7, back for f/u in afib for tikosyn use. Maintaining SR. No c/o chest pain,but still has some gum bleeding where irritated with his partial. He avoids wearing for most part. He states he will require more dental surgery in the near future for lower teeth.  In afib clinic for f/u, he had an episode of afib 8/18 and went to the ER. He woke up at 4:30 am and was aware of irregular heart beat. After an hour of afib, he proceeded to the ER. He was recently evaluated at the [facility] and wore a 10 day monitor without any evidence of afib but states he was told next time he had afib to go to ER. Rate was not  rapid and he was not that uncomfortable. He also wanted to go because he had a rash and was at the tail end of a prednisone taper as well as receiving cortisone shot and he wanted that evaluated as well. He returned to afib with in the hour of ER visit. Otherwise, he has afib only about once every 4 months and will return to SR within 1-2 hours. Discussed ablation but with current low afib burden, he is happy to wait on this approach.He has stopped drinking alcohol and is playing golf several times a week.Echo repeated in May shows an improvement in EF to 45-50 % from previous 35%.  F/u afib clinic 11/27. He is doing well and has not had any appreciable afib since 10/22 when he presented to the ER. HR was 108 bpm. He told me he had been to a party and drank more alcohol than usual and took his tikosyn late. He was given cardizem in the ER and d/c. He converted a few hours later. He has prn cardizem to use but he was afraid to take it.   Today, he denies symptoms of palpitations, chest pain, shortness of breath, orthopnea, PND, lower extremity edema, dizziness, presyncope, syncope, or neurologic sequela. The patient is tolerating medications without difficulties and is otherwise without complaint today.   Past Medical History:  Diagnosis Date  .  AAA (abdominal aortic aneurysm) ([place])    stent graft put in 02/2009  . Arthritis   . CAD (coronary artery disease)    a. cath 2010 b. CAD s/p PCI to ramus and LCX on 03/04/15  . Gout   . Hyperlipidemia   . Hypertension   . OSA (obstructive sleep apnea) 09/30/2015   Very mild with AHI 6.2/hr  . Paroxysmal atrial fibrillation (HCC)   . Tobacco abuse    Past Surgical History:  Procedure Laterality Date  . ABDOMINAL AORTIC ANEURYSM REPAIR  02/19/2009   performed by [facility]  . COLONOSCOPY    . KNEE ARTHROSCOPY  2003   left  . LEFT AND RIGHT HEART CATHETERIZATION WITH CORONARY ANGIOGRAM N/A 03/03/2015   Procedure: LEFT AND RIGHT HEART CATHETERIZATION WITH  CORONARY ANGIOGRAM;  Surgeon: Troy Sine, MD;    . PERCUTANEOUS CORONARY STENT INTERVENTION (PCI-S) N/A 03/04/2015   Procedure: PERCUTANEOUS CORONARY STENT INTERVENTION (PCI-S);  Surgeon: Troy Sine, MD; RI 99>>0% w/   BMS, bifurcation CFX-OM 90>>40% w/ Angiosculpt PTCA; dCFX 90>>0% w/   2.7520 mm Rebel BMS  . SHOULDER ARTHROSCOPY WITH ROTATOR CUFF REPAIR AND SUBACROMIAL DECOMPRESSION Left 11/19/2013   Procedure: LEFT SHOULDER ARTHROSCOPY DEBRIDEMENT EXTENTSIVE DISTAL CLAVICULECTOMY DECOMPRESSION PARTIAL ACROMIOPLASTY WITH CORACOACROMIAL WITH ROTATOR CUFF REPAIR ;  Surgeon: Renette Butters, MD;  Location: [facility];  Service: Orthopedics;  Laterality: Left;    Current Outpatient Prescriptions  Medication Sig Dispense Refill  . amLODipine (NORVASC) 10 MG tablet TAKE 1 TABLET BY MOUTH EVERY DAY 30 tablet 6  . apixaban (ELIQUIS) 5 MG TABS tablet Take 1 tablet (5 mg total) by mouth 2 (two) times daily. 180 tablet 3  . aspirin EC 81 MG tablet Take 81 mg by mouth daily.    Marland Kitchen atorvastatin (LIPITOR) 80 MG tablet Take 1 tablet (80 mg total) by mouth daily. 90 tablet 3  . carvedilol (COREG) 6.25 MG tablet Take 1 tablet (6.25 mg total) by mouth 2 (two) times daily with a meal. 60 tablet 11  . diltiazem (CARDIZEM) 30 MG tablet Take 1 tablet every 4 hours AS NEEDED for irregular HR>100 and BP>100 30 tablet 1  . dofetilide (TIKOSYN) 250 MCG capsule Take 1 capsule (250 mcg total) by mouth 2 (two) times daily. 60 capsule 5  . famotidine (PEPCID) 40 MG tablet Take 1 tablet (40 mg total) by mouth 2 (two) times daily. 180 tablet 1  . fish oil-omega-3 fatty acids 1000 MG capsule Take 1 g by mouth daily.     . furosemide (LASIX) 20 MG tablet Take 1 tablet (20 mg total) by mouth daily. 30 tablet 11  . irbesartan (AVAPRO) 300 MG tablet Take 1 tablet (300 mg total) by mouth daily. 30 tablet 11  . isosorbide mononitrate (IMDUR) 60 MG 24 hr tablet TAKE 1 TABLET BY MOUTH EVERY DAY 30 tablet 10  .  KLOR-CON M10 10 MEQ tablet TAKE 1 TABLET EVERY DAY 90 tablet 3  . loratadine (CLARITIN) 10 MG tablet Take 10 mg by mouth daily as needed for allergies.    . Multiple Vitamin (MULTIVITAMIN WITH MINERALS) TABS tablet Take 1 tablet by mouth daily.    . nitroGLYCERIN (NITROSTAT) 0.4 MG SL tablet Place 1 tablet (0.4 mg total) under the tongue every 5 (five) minutes as needed for chest pain (CP or SOB). 25 tablet 12   No current facility-administered medications for this encounter.     Allergies  Allergen Reactions  . Iohexol  Code: RASH, Desc: PAITENT STATED THAT HE BEGAN ITCHING TOWARDS END OF INJECTIN OF IV CONTRAST-- 13 HR PREP RECOMMENDED/MMS   . Lisinopril Cough    Social History   Social History  . Marital status: Married    Spouse name: N/A  . Number of children: N/A  . Years of education: N/A   Occupational History  . Not on file.   Social History Main Topics  . Smoking status: Former Smoker    Packs/day: 0.50    Years: 50.00    Types: Cigarettes    Quit date: 03/01/2016  . Smokeless tobacco: Never Used  . Alcohol use 4.2 - 4.8 oz/week    6 Cans of beer, 1 - 2 Shots of liquor per week     Comment: couple shots of royal per day  . Drug use: No  . Sexual activity: Not Currently     Comment: cutting down 1/2 ppd   Other Topics Concern  . Not on file   Social History Narrative   Lives with wife, does not use assist, drives.  No home health services.      Family History  Problem Relation Age of Onset  . Prostate cancer Father   . Hypertension Mother   . Diabetes Sister   . Hypertension Sister   . Stroke Neg Hx   . Heart attack Neg Hx     ROS- All systems are reviewed and negative except as per the HPI above  Physical Exam: Vitals:   11/07/16 0834  BP: (!) 156/80  Pulse: (!) 56  Weight: 222 lb 6.4 oz (100.9 kg)  Height: 6\' 2"  (1.88 m)    GEN- The patient is well appearing, alert and oriented x 3 today.   Head- normocephalic, atraumatic Eyes-   Sclera clear, conjunctiva pink Ears- hearing intact Oropharynx- clear Neck- supple, no JVP Lymph- no cervical lymphadenopathy Lungs- Clear to ausculation bilaterally, normal work of breathing Heart-Regular rate and rhythm, no murmurs, rubs or gallops, PMI not laterally displaced GI- soft, NT, ND, + BS Extremities- no clubbing, cyanosis, or edema MS- no significant deformity or atrophy Skin- no rash or lesion Psych- euthymic mood, full affect Neuro- strength and sensation are intact  EKG-Sinus brady at 56 bpm, NS st/t wave abnormality,  Pr int 196 ms, qrs int 90 ms, qtc 436 ms(stable)  Echo- Repeated in May- - Left ventricle: The cavity size was mildly dilated. Wall   thickness was increased in a pattern of mild LVH. Systolic   function was mildly reduced. The estimated ejection fraction was   in the range of 45% to 50%. Diffuse hypokinesis. Doppler   parameters are consistent with abnormal left ventricular   relaxation (grade 1 diastolic dysfunction). - Aortic root: The aortic root was mildly dilated. - Left atrium: The atrium was mildly dilated. 43 mm - Pulmonary arteries: Systolic pressure was mildly to moderately   increased. PA peak pressure: 44 mm Hg (S).  Impressions:  - Mild global reduction in LV function; grade 1 diastolic   dysfunction; mild LVH and LVE; mild LAE; trace MR and TR; mild to   moderate elevation in pulmonary pressure.  Epic records reviewed.   Assessment and Plan:  1.  Asymptomatic PAF For most part, low afib burden,staying in rhythm with tikosyn and carvedilol, qtc stable Recent afib in the setting of alcohol, ER visit but pt converted within a few hours going home. Reviewed with pt how to take 30 mg cardizem for breakthrough afib and how to  prevent ER visits Discussed when appropriate to go to there ER with afib and how to manage at home Continue apixaban Discussed ablation but afib burden  still low and pt is not interested at this time   bmet/mag today  2. Lifestyle factors contributing to afib burden Cautioned re alcohol and afib burden Continue with smoking cessation   Recent sleep study with mild sleep apnea, no cpap required Continue exercise efforts   3. CAD Appears stable Continue asa,stain, bb, nitro   4. HTN Not optimal today Continue amlodipine, irbesartan, lasix Minimize salt in the diet  F/u 3 months in afib clinic for monitoring of tiksoyn F/u with Dr. Debara Pickett as scheduled   Geroge Baseman. Eastyn Skalla, [facility] [address] [place], [place] [address] [phone]

## 2016-11-15 NOTE — Addendum Note (Signed)
Addended by: Lianne Cure A on: 11/15/2016 09:07 AM   Modules accepted: Orders

## 2016-11-21 ENCOUNTER — Ambulatory Visit (HOSPITAL_COMMUNITY)
Admission: RE | Admit: 2016-11-21 | Discharge: 2016-11-21 | Disposition: A | Discharge status: Home / Self Care | Payer: No Typology Code available for payment source | Source: Ambulatory Visit | Attending: Nurse Practitioner | Admitting: Nurse Practitioner

## 2016-11-21 ENCOUNTER — Other Ambulatory Visit (HOSPITAL_COMMUNITY): Payer: Self-pay | Admitting: *Deleted

## 2016-11-21 DIAGNOSIS — I480 Paroxysmal atrial fibrillation: Secondary | ICD-10-CM | POA: Diagnosis not present

## 2016-11-21 DIAGNOSIS — I481 Persistent atrial fibrillation: Secondary | ICD-10-CM | POA: Insufficient documentation

## 2016-11-21 LAB — BASIC METABOLIC PANEL
Anion gap: 7 (ref 5–15)
BUN: 14 mg/dL (ref 6–20)
CALCIUM: 8.8 mg/dL — AB (ref 8.9–10.3)
CO2: 27 mmol/L (ref 22–32)
CREATININE: 1.31 mg/dL — AB (ref 0.61–1.24)
Chloride: 108 mmol/L (ref 101–111)
GFR calc Af Amer: >60 mL/min (ref >60)
GFR, EST NON AFRICAN AMERICAN: 52 mL/min — AB (ref >60)
GLUCOSE: 114 mg/dL — AB (ref 65–99)
Potassium: 4.1 mmol/L (ref 3.5–5.1)
Sodium: 142 mmol/L (ref 135–145)

## 2016-11-21 MED ORDER — POTASSIUM CHLORIDE CRYS ER 10 MEQ PO TBCR: 10.0000 meq | EXTENDED_RELEASE_TABLET | Freq: Two times a day (BID) | ORAL | 3 refills | Status: DC

## 2016-11-21 NOTE — Progress Notes (Signed)
error 

## 2017-01-01 ENCOUNTER — Other Ambulatory Visit (HOSPITAL_COMMUNITY): Payer: Self-pay | Admitting: Nurse Practitioner

## 2017-02-23 ENCOUNTER — Other Ambulatory Visit: Payer: Self-pay | Admitting: Internal Medicine

## 2017-03-11 ENCOUNTER — Other Ambulatory Visit: Payer: Self-pay

## 2017-03-11 ENCOUNTER — Emergency Department (HOSPITAL_COMMUNITY): Payer: Medicare Other

## 2017-03-11 ENCOUNTER — Encounter (HOSPITAL_COMMUNITY): Payer: Self-pay | Admitting: Adult Health

## 2017-03-11 ENCOUNTER — Inpatient Hospital Stay (HOSPITAL_COMMUNITY)
Admission: EM | Admit: 2017-03-11 | Discharge: 2017-03-13 | DRG: 310 | Disposition: A | Discharge status: Home / Self Care | Payer: Medicare Other | Attending: Cardiology | Admitting: Cardiology

## 2017-03-11 DIAGNOSIS — Z91041 Radiographic dye allergy status: Secondary | ICD-10-CM

## 2017-03-11 DIAGNOSIS — Z8249 Family history of ischemic heart disease and other diseases of the circulatory system: Secondary | ICD-10-CM

## 2017-03-11 DIAGNOSIS — T462X5A Adverse effect of other antidysrhythmic drugs, initial encounter: Secondary | ICD-10-CM | POA: Diagnosis present

## 2017-03-11 DIAGNOSIS — I255 Ischemic cardiomyopathy: Secondary | ICD-10-CM | POA: Diagnosis present

## 2017-03-11 DIAGNOSIS — I480 Paroxysmal atrial fibrillation: Secondary | ICD-10-CM | POA: Diagnosis not present

## 2017-03-11 DIAGNOSIS — I2510 Atherosclerotic heart disease of native coronary artery without angina pectoris: Secondary | ICD-10-CM | POA: Diagnosis present

## 2017-03-11 DIAGNOSIS — Z7982 Long term (current) use of aspirin: Secondary | ICD-10-CM

## 2017-03-11 DIAGNOSIS — Z7901 Long term (current) use of anticoagulants: Secondary | ICD-10-CM | POA: Diagnosis not present

## 2017-03-11 DIAGNOSIS — Z8673 Personal history of transient ischemic attack (TIA), and cerebral infarction without residual deficits: Secondary | ICD-10-CM

## 2017-03-11 DIAGNOSIS — M1990 Unspecified osteoarthritis, unspecified site: Secondary | ICD-10-CM | POA: Diagnosis not present

## 2017-03-11 DIAGNOSIS — E785 Hyperlipidemia, unspecified: Secondary | ICD-10-CM | POA: Diagnosis present

## 2017-03-11 DIAGNOSIS — Z833 Family history of diabetes mellitus: Secondary | ICD-10-CM | POA: Diagnosis not present

## 2017-03-11 DIAGNOSIS — G4733 Obstructive sleep apnea (adult) (pediatric): Secondary | ICD-10-CM | POA: Diagnosis present

## 2017-03-11 DIAGNOSIS — Z955 Presence of coronary angioplasty implant and graft: Secondary | ICD-10-CM | POA: Diagnosis not present

## 2017-03-11 DIAGNOSIS — Z79899 Other long term (current) drug therapy: Secondary | ICD-10-CM | POA: Diagnosis not present

## 2017-03-11 DIAGNOSIS — I493 Ventricular premature depolarization: Secondary | ICD-10-CM | POA: Diagnosis not present

## 2017-03-11 DIAGNOSIS — I10 Essential (primary) hypertension: Secondary | ICD-10-CM | POA: Diagnosis not present

## 2017-03-11 DIAGNOSIS — M109 Gout, unspecified: Secondary | ICD-10-CM | POA: Diagnosis present

## 2017-03-11 DIAGNOSIS — Z888 Allergy status to other drugs, medicaments and biological substances status: Secondary | ICD-10-CM

## 2017-03-11 DIAGNOSIS — Z87891 Personal history of nicotine dependence: Secondary | ICD-10-CM

## 2017-03-11 DIAGNOSIS — Z95828 Presence of other vascular implants and grafts: Secondary | ICD-10-CM

## 2017-03-11 LAB — URINALYSIS, ROUTINE W REFLEX MICROSCOPIC
BACTERIA UA: NONE SEEN
BILIRUBIN URINE: NEGATIVE
Glucose, UA: NEGATIVE mg/dL
KETONES UR: NEGATIVE mg/dL
Leukocytes, UA: NEGATIVE
Nitrite: NEGATIVE
PROTEIN: NEGATIVE mg/dL
Specific Gravity, Urine: 1.009 (ref 1.005–1.030)
Squamous Epithelial / HPF: NONE SEEN
pH: 5 (ref 5.0–8.0)

## 2017-03-11 LAB — BASIC METABOLIC PANEL
Anion gap: 5 (ref 5–15)
BUN: 11 mg/dL (ref 6–20)
CHLORIDE: 111 mmol/L (ref 101–111)
CO2: 20 mmol/L — ABNORMAL LOW (ref 22–32)
CREATININE: 1.12 mg/dL (ref 0.61–1.24)
Calcium: 8.7 mg/dL — ABNORMAL LOW (ref 8.9–10.3)
GFR calc Af Amer: >60 mL/min (ref >60)
GFR calc non Af Amer: >60 mL/min (ref >60)
GLUCOSE: 100 mg/dL — AB (ref 65–99)
Potassium: 3.8 mmol/L (ref 3.5–5.1)
Sodium: 136 mmol/L (ref 135–145)

## 2017-03-11 LAB — MAGNESIUM: MAGNESIUM: 2.1 mg/dL (ref 1.7–2.4)

## 2017-03-11 LAB — CBC
HCT: 43.7 % (ref 39.0–52.0)
Hemoglobin: 14.2 g/dL (ref 13.0–17.0)
MCH: 29 pg (ref 26.0–34.0)
MCHC: 32.5 g/dL (ref 30.0–36.0)
MCV: 89.2 fL (ref 78.0–100.0)
Platelets: 152 10*3/uL (ref 150–400)
RBC: 4.9 MIL/uL (ref 4.22–5.81)
RDW: 13.5 % (ref 11.5–15.5)
WBC: 3.6 10*3/uL — AB (ref 4.0–10.5)

## 2017-03-11 LAB — TSH: TSH: 1.456 u[IU]/mL (ref 0.350–4.500)

## 2017-03-11 LAB — I-STAT TROPONIN, ED: Troponin i, poc: 0 ng/mL (ref 0.00–0.08)

## 2017-03-11 LAB — BRAIN NATRIURETIC PEPTIDE: B Natriuretic Peptide: 61.5 pg/mL (ref 0.0–100.0)

## 2017-03-11 MED ORDER — POTASSIUM CHLORIDE ER 10 MEQ PO TBCR
20.0000 meq | EXTENDED_RELEASE_TABLET | Freq: Once | ORAL | Status: AC
  Administered 2017-03-12: 20 meq via ORAL
  Filled 2017-03-11 (×2): qty 2

## 2017-03-11 NOTE — ED Provider Notes (Signed)
[facility] DEPT Provider Note   CSN: [id number] Arrival date & time: 03/11/17  1955     History   Chief Complaint Chief Complaint  Patient presents with  . Chest Pain    HPI Rodney [name] is a 76 y.o. male.  HPI 76 year old male with past medical history of coronary artery disease and paroxysmal A. fib followed by A. fib clinic who presents with symptomatic palpitations. The patient was recently seen and treated for atrial fibrillation. He is normally in sinus rhythm. However, patient states that 3 days ago, he noticed onset of palpitations and dyspnea on exertion. He has been taking his decrease in an Eliquis as prescribed but has had persistent palpitations as well as shortness of breath with exertion. Denies any chest pain, but he never has chest pain with this atrial fibrillation. He has had associated fatigue and has difficulty getting around the house. When his symptoms did not improve and he noted his blood pressure and heart rate to increase at home, he presents for evaluation.  Past Medical History:  Diagnosis Date  . AAA (abdominal aortic aneurysm) ([place])    stent graft put in 02/2009  . Arthritis   . CAD (coronary artery disease)    a. cath 2010 b. CAD s/p PCI to ramus and LCX on 03/04/15  . Gout   . Hyperlipidemia   . Hypertension   . OSA (obstructive sleep apnea) 09/30/2015   Very mild with AHI 6.2/hr  . Paroxysmal atrial fibrillation (HCC)   . Tobacco abuse     Patient Active Problem List   Diagnosis Date Noted  . Premature ventricular contractions (PVCs) (VPCs) 03/11/2017  . H/O endovascular stent graft for abdominal aortic aneurysm 09/01/2016  . Cardiomyopathy, ischemic 03/30/2016  . OSA (obstructive sleep apnea) 09/30/2015  . Encounter for monitoring anti-arrhythmic therapy 05/04/2015  . DOE (dyspnea on exertion) 03/16/2015  . Atrial fibrillation with RVR ([place]) 03/16/2015  . Hypertensive urgency 03/08/2015  . Coronary artery disease due to lipid rich  plaque   . AA (aortic aneurysm) ([place])   . Dyspnea 03/02/2015  . Progressive angina ([place]) 03/02/2015  . History of TIA (transient ischemic attack), 02/20/15 03/02/2015  . Mitral regurgitation 03/02/2015  . HX: anticoagulation 03/02/2015  . Chest tightness   . Tobacco abuse   . HLD (hyperlipidemia)   . TIA (transient ischemic attack) 02/20/2015  . 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 Chronic combined systolic and diastolic heart failure, NYHA class 1 ([place]) 02/20/2015  . PAF (paroxysmal atrial fibrillation) ([place]) 01/29/2015  . Accelerated hypertension, hx of 01/03/2015  . Acute diastolic CHF (congestive heart failure), NYHA class 2 ([place]) 01/03/2015  . Demand ischemia ([place]) 01/03/2015  . CAD (coronary artery disease) stent place 03/04/15 01/03/2015  . Troponin level elevated   . Thrombocytopenia ([place])   . Essential hypertension   . S/P abdominal aortic aneurysm repair 07/03/2012    Past Surgical History:  Procedure Laterality Date  . ABDOMINAL AORTIC ANEURYSM REPAIR  02/19/2009   performed by [facility]  . COLONOSCOPY    . KNEE ARTHROSCOPY  2003   left  . LEFT AND RIGHT HEART CATHETERIZATION WITH CORONARY ANGIOGRAM N/A 03/03/2015   Procedure: LEFT AND RIGHT HEART CATHETERIZATION WITH CORONARY ANGIOGRAM;  Surgeon: Troy Sine, MD;    . PERCUTANEOUS CORONARY STENT INTERVENTION (PCI-S) N/A 03/04/2015   Procedure: PERCUTANEOUS CORONARY STENT INTERVENTION (PCI-S);  Surgeon: Troy Sine, MD; RI 99>>0% w/   BMS, bifurcation CFX-OM 90>>40% w/ Angiosculpt PTCA; dCFX 90>>0% w/   2.7520  mm Rebel BMS  . SHOULDER ARTHROSCOPY WITH ROTATOR CUFF REPAIR AND SUBACROMIAL DECOMPRESSION Left 11/19/2013   Procedure: LEFT SHOULDER ARTHROSCOPY DEBRIDEMENT EXTENTSIVE DISTAL CLAVICULECTOMY DECOMPRESSION PARTIAL ACROMIOPLASTY WITH CORACOACROMIAL WITH ROTATOR CUFF REPAIR ;  Surgeon: Renette Butters, MD;  Location: [facility];  Service: Orthopedics;  Laterality: Left;       Home Medications    Prior to  Admission medications   Medication Sig Start Date End Date Taking? Authorizing Provider  amLODipine (NORVASC) 10 MG tablet TAKE 1 TABLET BY MOUTH EVERY DAY Patient taking differently: TAKE 1 TABLET BY MOUTH EVERY DAY AT BEDTIME 09/07/16  Yes [name], MD  apixaban (ELIQUIS) 5 MG TABS tablet Take 1 tablet (5 mg total) by mouth 2 (two) times daily. 05/26/15  Yes [name], MD  aspirin EC 81 MG tablet Take 81 mg by mouth daily.   Yes Historical Provider, MD  atorvastatin (LIPITOR) 80 MG tablet Take 1 tablet (80 mg total) by mouth daily. Patient taking differently: Take 80 mg by mouth at bedtime.  05/21/15  Yes [name], MD  carvedilol (COREG) 6.25 MG tablet Take 1 tablet (6.25 mg total) by mouth 2 (two) times daily with a meal. 03/30/16  Yes [name], MD  diltiazem (CARDIZEM) 30 MG tablet TAKE 1 TABLET EVERY 4 HOURS AS NEEDED FOR IRREGULAR HR>100 AND BP>100 01/02/17  Yes Sherran Needs, NP  dofetilide (TIKOSYN) 250 MCG capsule Take 1 capsule (250 mcg total) by mouth 2 (two) times daily. 03/20/15  Yes Brittainy Erie Noe, PA-C  famotidine (PEPCID) 40 MG tablet Take 1 tablet (40 mg total) by mouth 2 (two) times daily. 10/19/15  Yes [name], MD  fish oil-omega-3 fatty acids 1000 MG capsule Take 1 g by mouth daily.    Yes Historical Provider, MD  furosemide (LASIX) 20 MG tablet Take 1 tablet (20 mg total) by mouth daily. 03/30/16  Yes [name], MD  irbesartan (AVAPRO) 300 MG tablet Take 1 tablet (300 mg total) by mouth daily. 02/23/17  Yes [name], MD  isosorbide mononitrate (IMDUR) 60 MG 24 hr tablet TAKE 1 TABLET BY MOUTH EVERY DAY 04/25/16  Yes [name], MD  loratadine (CLARITIN) 10 MG tablet Take 10 mg by mouth daily as needed (seasonal allergies).    Yes Historical Provider, MD  Multiple Vitamin (MULTIVITAMIN WITH MINERALS) TABS tablet Take 1 tablet by mouth daily.   Yes Historical Provider, MD  nitroGLYCERIN (NITROSTAT) 0.4 MG SL tablet Place 1 tablet  (0.4 mg total) under the tongue every 5 (five) minutes as needed for chest pain (CP or SOB). Patient taking differently: Place 0.4 mg under the tongue every 5 (five) minutes as needed for chest pain (shortness of breath).  03/09/15  Yes Brett Canales, PA-C  potassium chloride (KLOR-CON M10) 10 MEQ tablet Take 1 tablet (10 mEq total) by mouth 2 (two) times daily. 11/21/16  Yes Sherran Needs, NP    Family History Family History  Problem Relation Age of Onset  . Prostate cancer Father   . Hypertension Mother   . Diabetes Sister   . Hypertension Sister   . Stroke Neg Hx   . Heart attack Neg Hx     Social History Social History  Substance Use Topics  . Smoking status: Former Smoker    Packs/day: 0.50    Years: 50.00    Types: Cigarettes    Quit date: 03/01/2016  . Smokeless tobacco: Never  Used  . Alcohol use 4.2 - 4.8 oz/week    6 Cans of beer, 1 - 2 Shots of liquor per week     Comment: couple shots of royal per day     Allergies   Iohexol and Lisinopril   Review of Systems Review of Systems  Constitutional: Positive for fatigue.  Respiratory: Positive for chest tightness and shortness of breath.   Cardiovascular: Positive for palpitations.  All other systems reviewed and are negative.    Physical Exam Updated Vital Signs BP 111/75 (BP Location: Left Arm)   Pulse 63   Temp 97.9 F (36.6 C) (Oral)   Resp 16   Ht 6\' 2"  (1.88 m)   Wt 215 lb 6.4 oz (97.7 kg)   SpO2 97%   BMI 27.66 kg/m   Physical Exam  Constitutional: He is oriented to person, place, and time. He appears well-developed and well-nourished. No distress.  HENT:  Head: Normocephalic and atraumatic.  Eyes: Conjunctivae are normal.  Neck: Neck supple.  Cardiovascular: Normal rate, normal heart sounds and normal pulses.  An irregularly irregular rhythm present.  Extrasystoles are present. Exam reveals no friction rub.   No murmur heard. Pulmonary/Chest: Effort normal and breath sounds normal. No  respiratory distress. He has no wheezes. He has no rales.  Abdominal: He exhibits no distension.  Musculoskeletal: He exhibits no edema.  Neurological: He is alert and oriented to person, place, and time. He exhibits normal muscle tone.  Skin: Skin is warm. Capillary refill takes less than 2 seconds.  Psychiatric: He has a normal mood and affect.  Nursing note and vitals reviewed.    ED Treatments / Results  Labs (all labs ordered are listed, but only abnormal results are displayed) Labs Reviewed  BASIC METABOLIC PANEL - Abnormal; Notable for the following:       Result Value   CO2 20 (*)    Glucose, Bld 100 (*)    Calcium 8.7 (*)    All other components within normal limits  CBC - Abnormal; Notable for the following:    WBC 3.6 (*)    All other components within normal limits  URINALYSIS, ROUTINE W REFLEX MICROSCOPIC - Abnormal; Notable for the following:    Hgb urine dipstick SMALL (*)    All other components within normal limits  BASIC METABOLIC PANEL - Abnormal; Notable for the following:    Glucose, Bld 117 (*)    Calcium 8.8 (*)    GFR calc non Af Amer 59 (*)    All other components within normal limits  CBC - Abnormal; Notable for the following:    Platelets 147 (*)    All other components within normal limits  PROTIME-INR - Abnormal; Notable for the following:    Prothrombin Time 15.9 (*)    All other components within normal limits  MAGNESIUM  TSH  BRAIN NATRIURETIC PEPTIDE  MAGNESIUM  I-STAT TROPOININ, ED    EKG  EKG Interpretation  Date/Time:  Saturday March 11 2017 20:00:04 EDT Ventricular Rate:  88 PR Interval:    QRS Duration: 88 QT Interval:  362 QTC Calculation: 438 R Axis:   20 Text Interpretation:  Atrial fibrillation with premature ventricular or aberrantly conducted complexes ST & T wave abnormality, consider inferolateral ischemia Abnormal ECG Since last EKG Atrial fibrillation has replaced sinus rhythm Confirmed by Roshawna Colclasure MD, Naif Alabi [phone])  on 03/11/2017 9:41:23 PM       Radiology Dg Chest 2 View  Result Date: 03/11/2017 CLINICAL DATA:  Acute onset of generalized chest discomfort. Initial encounter. EXAM: CHEST  2 VIEW COMPARISON:  Chest radiograph performed 10/02/2016 FINDINGS: The lungs are well-aerated. Mild left basilar opacity likely reflects atelectasis or scarring. There is no evidence of pleural effusion or pneumothorax. The heart is normal in size; the mediastinal contour is within normal limits. No acute osseous abnormalities are seen. IMPRESSION: Mild left basilar airspace opacity likely reflects atelectasis or scarring. Lungs otherwise clear. Electronically Signed   By: Garald Balding M.D.   On: 03/11/2017 21:25    Procedures Procedures (including critical care time)  Medications Ordered in ED Medications  irbesartan (AVAPRO) tablet 300 mg (300 mg Oral Given 03/12/17 1024)  potassium chloride (K-DUR,KLOR-CON) CR tablet 10 mEq (10 mEq Oral Given 03/12/17 1025)  amLODipine (NORVASC) tablet 10 mg (not administered)  isosorbide mononitrate (IMDUR) 24 hr tablet 60 mg (60 mg Oral Given 03/12/17 1024)  furosemide (LASIX) tablet 20 mg (20 mg Oral Given 03/12/17 1026)  famotidine (PEPCID) tablet 40 mg (40 mg Oral Given 03/12/17 1024)  apixaban (ELIQUIS) tablet 5 mg (5 mg Oral Given 03/12/17 1024)  atorvastatin (LIPITOR) tablet 80 mg (not administered)  aspirin EC tablet 81 mg (81 mg Oral Given 03/12/17 1025)  omega-3 acid ethyl esters (LOVAZA) capsule 1 g (1 g Oral Given 03/12/17 1025)  acetaminophen (TYLENOL) tablet 650 mg (not administered)  ondansetron (ZOFRAN) injection 4 mg (not administered)  potassium chloride (K-DUR) CR tablet 20 mEq (20 mEq Oral Given 03/12/17 0116)     Initial Impression / Assessment and Plan / ED Course  I have reviewed the triage vital signs and the nursing notes.  Pertinent labs & imaging results that were available during my care of the patient were reviewed by me and considered in my medical decision  making (see chart for details).     76 yo M with PMHx as above here with symptomatic palpitations. On arrival, tele and EKG shows intermittent sinus rhythm with frequent PVCs and couplets. Lab work is unremarkable. No ischemia. Concern for tikosyn related arrhythmia versus occult ischemia/SSS. D/w Cardiology - pt is high risk of malignant arrhythmia. Will admit for tele, obs. Pt in agreement. He is HDS here.  Final Clinical Impressions(s) / ED Diagnoses   Final diagnoses:  Frequent PVCs  Paroxysmal atrial fibrillation [facility])    New Prescriptions Current Discharge Medication List       Duffy Bruce, MD 03/12/17 1140

## 2017-03-11 NOTE — H&P (Signed)
History & Physical    Patient ID: Rodney [name] MRN: [id number], DOB/AGE: 1941-11-25   Admit date: 03/11/2017   Primary Physician: [name], MD Primary Cardiologist: [facility]  Patient Profile    [phone] with CAD s/p PCI (RI and LCx 03/04/15), AAA s/p stenting, OSA, pAF on Tikosyn and apixaban who presents with closesly coupled PVCs in setting of paroxysmal bradycardia, concerning for tikosyn toxicity.   Past Medical History    Past Medical History:  Diagnosis Date  . AAA (abdominal aortic aneurysm) ([place])    stent graft put in 02/2009  . Arthritis   . CAD (coronary artery disease)    a. cath 2010 b. CAD s/p PCI to ramus and LCX on 03/04/15  . Gout   . Hyperlipidemia   . Hypertension   . OSA (obstructive sleep apnea) 09/30/2015   Very mild with AHI 6.2/hr  . Paroxysmal atrial fibrillation (H[facility])   . Tobacco abuse     Past Surgical History:  Procedure Laterality Date  . ABDOMINAL AORTIC ANEURYSM REPAIR  02/19/2009   performed by [facility]  . COLONOSCOPY    . KNEE ARTHROSCOPY  2003   left  . LEFT AND RIGHT HEART CATHETERIZATION WITH CORONARY ANGIOGRAM N/A 03/03/2015   Procedure: LEFT AND RIGHT HEART CATHETERIZATION WITH CORONARY ANGIOGRAM;  Surgeon: Troy Sine, MD;    . PERCUTANEOUS CORONARY STENT INTERVENTION (PCI-S) N/A 03/04/2015   Procedure: PERCUTANEOUS CORONARY STENT INTERVENTION (PCI-S);  Surgeon: Troy Sine, MD; RI 99>>0% w/   BMS, bifurcation CFX-OM 90>>40% w/ Angiosculpt PTCA; dCFX 90>>0% w/   2.7520 mm Rebel BMS  . SHOULDER ARTHROSCOPY WITH ROTATOR CUFF REPAIR AND SUBACROMIAL DECOMPRESSION Left 11/19/2013   Procedure: LEFT SHOULDER ARTHROSCOPY DEBRIDEMENT EXTENTSIVE DISTAL CLAVICULECTOMY DECOMPRESSION PARTIAL ACROMIOPLASTY WITH CORACOACROMIAL WITH ROTATOR CUFF REPAIR ;  Surgeon: Renette Butters, MD;  Location: [facility];  Service: Orthopedics;  Laterality: Left;     Allergies  Allergies  Allergen Reactions  . Iohexol Other (See Comments)       Code: RASH, Desc: PATIENT STATED THAT HE BEGAN ITCHING TOWARDS END OF INJECTION OF IV CONTRAST-- 13 HR PREP RECOMMENDED/MMS   . Lisinopril Cough    History of Present Illness    76M with CAD s/p PCI (RI and LCx 03/04/15), AAA s/p stenting, OSA, pAF on Tikosyn and apixaban who presents with symptoms he felt were due to recurrent AF.  Rodney [name] states he has been on tikosyn 249mcg BID and reports taking it religiously. He states he could not tolerate 529mcg BID. He has generally had good rhythm control on the agent with just occasional brief recurrences.  He presented to the ER today due to symptoms he thought might be consistent with recurrent AF.   However, he also noted that some aspects were different in that his HR was around 50 (usually in 80s-90s in AF) and he felt lightheadedness which he doesn't feel with AF. Also some intermittent chest discomfort. On the whole, he stated he has not felt like this before.   Aside from taking an antibiotic recently for a URI (last dose nearly a week ago, he cannot remember the name but states it was 4x per day dosing), nothing else was out of the ordinary. Denies medication changes or accidentally taking additional doses of any medications including coreg and tikosyn.   On arrival to the ER, VS notable for HR 86, RR 16, 134/75, 98% on RA. Labs notable for K 3.8, Cr 1.12, Mg 2.1, BNP  61.5. CXR demonstrated mild left basilar airspace opacity likely reflects atelectasis or scarring. ECG demonstrated NSR with frequent PACs. QTC ~421ms. Review of telemetry demonstrated frequent periods of bradycardia with PP intervals of 1.5 seconds followed by closely coupled couplets and occasional triplets.  This made it such that the averaged HR was "normal" although the sinus rate was often 40-50bpm.   Home Medications    Prior to Admission medications   Medication Sig Start Date End Date Taking? Authorizing Provider  amLODipine (NORVASC) 10 MG tablet TAKE 1 TABLET BY  MOUTH EVERY DAY Patient taking differently: TAKE 1 TABLET BY MOUTH EVERY DAY AT BEDTIME 09/07/16  Yes [name], MD  apixaban (ELIQUIS) 5 MG TABS tablet Take 1 tablet (5 mg total) by mouth 2 (two) times daily. 05/26/15  Yes [name], MD  aspirin EC 81 MG tablet Take 81 mg by mouth daily.   Yes Historical Provider, MD  atorvastatin (LIPITOR) 80 MG tablet Take 1 tablet (80 mg total) by mouth daily. Patient taking differently: Take 80 mg by mouth at bedtime.  05/21/15  Yes [name], MD  carvedilol (COREG) 6.25 MG tablet Take 1 tablet (6.25 mg total) by mouth 2 (two) times daily with a meal. 03/30/16  Yes [name], MD  diltiazem (CARDIZEM) 30 MG tablet TAKE 1 TABLET EVERY 4 HOURS AS NEEDED FOR IRREGULAR HR>100 AND BP>100 01/02/17  Yes Sherran Needs, NP  dofetilide (TIKOSYN) 250 MCG capsule Take 1 capsule (250 mcg total) by mouth 2 (two) times daily. 03/20/15  Yes Brittainy Erie Noe, PA-C  famotidine (PEPCID) 40 MG tablet Take 1 tablet (40 mg total) by mouth 2 (two) times daily. 10/19/15  Yes [name], MD  fish oil-omega-3 fatty acids 1000 MG capsule Take 1 g by mouth daily.    Yes Historical Provider, MD  furosemide (LASIX) 20 MG tablet Take 1 tablet (20 mg total) by mouth daily. 03/30/16  Yes [name], MD  irbesartan (AVAPRO) 300 MG tablet Take 1 tablet (300 mg total) by mouth daily. 02/23/17  Yes [name], MD  isosorbide mononitrate (IMDUR) 60 MG 24 hr tablet TAKE 1 TABLET BY MOUTH EVERY DAY 04/25/16  Yes [name], MD  loratadine (CLARITIN) 10 MG tablet Take 10 mg by mouth daily as needed (seasonal allergies).    Yes Historical Provider, MD  Multiple Vitamin (MULTIVITAMIN WITH MINERALS) TABS tablet Take 1 tablet by mouth daily.   Yes Historical Provider, MD  nitroGLYCERIN (NITROSTAT) 0.4 MG SL tablet Place 1 tablet (0.4 mg total) under the tongue every 5 (five) minutes as needed for chest pain (CP or SOB). Patient taking differently: Place 0.4 mg under the  tongue every 5 (five) minutes as needed for chest pain (shortness of breath).  03/09/15  Yes Brett Canales, PA-C  potassium chloride (KLOR-CON M10) 10 MEQ tablet Take 1 tablet (10 mEq total) by mouth 2 (two) times daily. 11/21/16  Yes Sherran Needs, NP    Family History    Family History  Problem Relation Age of Onset  . Prostate cancer Father   . Hypertension Mother   . Diabetes Sister   . Hypertension Sister   . Stroke Neg Hx   . Heart attack Neg Hx     Social History    Social History   Social History  . Marital status: Married    Spouse name: N/A  . Number of children: N/A  . Years of education: N/A  Occupational History  . Not on file.   Social History Main Topics  . Smoking status: Former Smoker    Packs/day: 0.50    Years: 50.00    Types: Cigarettes    Quit date: 03/01/2016  . Smokeless tobacco: Never Used  . Alcohol use 4.2 - 4.8 oz/week    6 Cans of beer, 1 - 2 Shots of liquor per week     Comment: couple shots of royal per day  . Drug use: No  . Sexual activity: Not Currently     Comment: cutting down 1/2 ppd   Other Topics Concern  . Not on file   Social History Narrative   Lives with wife, does not use assist, drives.  No home health services.       Review of Systems    General:  No chills, fever, night sweats or weight changes.  Cardiovascular:  See hpi Dermatological: No rash, lesions/masses Respiratory: No cough, dyspnea Urologic: No hematuria, dysuria Abdominal:   No nausea, vomiting, diarrhea, bright red blood per rectum, melena, or hematemesis Neurologic:  No visual changes, wkns, changes in mental status. All other systems reviewed and are otherwise negative except as noted above.  Physical Exam    Blood pressure (!) 146/77, pulse (!) 29, temperature 97.2 F (36.2 C), temperature source Oral, resp. rate 14, height 6\' 2"  (1.88 m), weight 102.1 kg (225 lb), SpO2 98 %.  General: Pleasant, NAD Psych: Normal affect. Neuro: Alert and  oriented X 3. Moves all extremities spontaneously. HEENT: Normal  Neck: Supple without bruits or JVD. Lungs:  Resp regular and unlabored, CTA. Heart: RRR no s3, s4, or murmurs. Abdomen: Soft, non-tender, non-distended, BS + x 4.  Extremities: No clubbing, cyanosis or edema. DP/PT/Radials 2+ and equal bilaterally.  Labs    Troponin [facility] Test)  Recent Labs  03/11/17 2022  TROPIPOC 0.00   No results for input(s): CKTOTAL, CKMB, TROPONINI in the last 72 hours. Lab Results  Component Value Date   WBC 3.6 (L) 03/11/2017   HGB 14.2 03/11/2017   HCT 43.7 03/11/2017   MCV 89.2 03/11/2017   PLT 152 03/11/2017    Recent Labs Lab 03/11/17 2013  NA 136  K 3.8  CL 111  CO2 20*  BUN 11  CREATININE 1.12  CALCIUM 8.7*  GLUCOSE 100*   Lab Results  Component Value Date   CHOL 125 02/21/2015   HDL 43 02/21/2015   LDLCALC 47 02/21/2015   TRIG 173 (H) 02/21/2015   No results found for: [facility]   Radiology Studies    Dg Chest 2 View  Result Date: 03/11/2017 CLINICAL DATA:  Acute onset of generalized chest discomfort. Initial encounter. EXAM: CHEST  2 VIEW COMPARISON:  Chest radiograph performed 10/02/2016 FINDINGS: The lungs are well-aerated. Mild left basilar opacity likely reflects atelectasis or scarring. There is no evidence of pleural effusion or pneumothorax. The heart is normal in size; the mediastinal contour is within normal limits. No acute osseous abnormalities are seen. IMPRESSION: Mild left basilar airspace opacity likely reflects atelectasis or scarring. Lungs otherwise clear. Electronically Signed   By: Garald Balding M.D.   On: 03/11/2017 21:25    ECG & Cardiac Imaging    04/19/16 TTE - Left ventricle: The cavity size was mildly dilated. Wall   thickness was increased in a pattern of mild LVH. Systolic   function was mildly reduced. The estimated ejection fraction was   in the range of 45% to 50%. Diffuse hypokinesis.  Doppler   parameters are consistent  with abnormal left ventricular   relaxation (grade 1 diastolic dysfunction). - Aortic root: The aortic root was mildly dilated. - Left atrium: The atrium was mildly dilated. - Pulmonary arteries: Systolic pressure was mildly to moderately   increased. PA peak pressure: 44 mm Hg (S).  Assessment & Plan    [phone] with CAD s/p PCI (RI and LCx 03/04/15), AAA s/p stenting, OSA, pAF on Tikosyn and apixaban who presents with symptomatic PVCs rather than symptomatic AF.  The closesly coupled PVCs in setting of paroxysmal bradycardia is concerning for tikosyn toxicity. Although it is possible that with pacemaker back-up tikosyn might be safe, there were a few instances where closely coupled PVCs occurred without antecedent bradycardia. He has had no sustained arrhythmias on telemetry. Given risk for torsades, will plan to admit for observation and reconsideration of rhythm control strategy. (He expressed interest in possibly pursuing ablation if he could not take tikosyn any longer)  1. Admit for observation 2. Hold both coreg and tikosyn 3. K>4, Mg>2  Signed, Rodney Sprinkles, MD 03/11/2017, 11:36 PM

## 2017-03-11 NOTE — ED Triage Notes (Signed)
PResents with nausea, SOB and chest pain and fatigue that began yesterday, he states, "When I feel this way I am usually in afib but my heart rate wasn't high enough to take my diltiazem" Endorses fatigue, generalized chest discomfort and SOB. Nothing makes it feel worse. Taking a blood thinner-eloquis. Alert and oriented, e/u respirations.

## 2017-03-11 NOTE — ED Notes (Signed)
Pt requested a cup of ice and ginger ale. Pt given the same

## 2017-03-12 DIAGNOSIS — Z8249 Family history of ischemic heart disease and other diseases of the circulatory system: Secondary | ICD-10-CM | POA: Diagnosis not present

## 2017-03-12 DIAGNOSIS — Z7982 Long term (current) use of aspirin: Secondary | ICD-10-CM | POA: Diagnosis not present

## 2017-03-12 DIAGNOSIS — I2510 Atherosclerotic heart disease of native coronary artery without angina pectoris: Secondary | ICD-10-CM | POA: Diagnosis not present

## 2017-03-12 DIAGNOSIS — T462X5A Adverse effect of other antidysrhythmic drugs, initial encounter: Secondary | ICD-10-CM | POA: Diagnosis not present

## 2017-03-12 DIAGNOSIS — M109 Gout, unspecified: Secondary | ICD-10-CM | POA: Diagnosis not present

## 2017-03-12 DIAGNOSIS — I255 Ischemic cardiomyopathy: Secondary | ICD-10-CM | POA: Diagnosis not present

## 2017-03-12 DIAGNOSIS — Z7901 Long term (current) use of anticoagulants: Secondary | ICD-10-CM | POA: Diagnosis not present

## 2017-03-12 DIAGNOSIS — Z833 Family history of diabetes mellitus: Secondary | ICD-10-CM | POA: Diagnosis not present

## 2017-03-12 DIAGNOSIS — Z955 Presence of coronary angioplasty implant and graft: Secondary | ICD-10-CM | POA: Diagnosis not present

## 2017-03-12 DIAGNOSIS — E785 Hyperlipidemia, unspecified: Secondary | ICD-10-CM | POA: Diagnosis not present

## 2017-03-12 DIAGNOSIS — I480 Paroxysmal atrial fibrillation: Secondary | ICD-10-CM | POA: Diagnosis not present

## 2017-03-12 DIAGNOSIS — Z79899 Other long term (current) drug therapy: Secondary | ICD-10-CM | POA: Diagnosis not present

## 2017-03-12 DIAGNOSIS — I493 Ventricular premature depolarization: Secondary | ICD-10-CM | POA: Diagnosis present

## 2017-03-12 DIAGNOSIS — I10 Essential (primary) hypertension: Secondary | ICD-10-CM | POA: Diagnosis not present

## 2017-03-12 DIAGNOSIS — G4733 Obstructive sleep apnea (adult) (pediatric): Secondary | ICD-10-CM | POA: Diagnosis not present

## 2017-03-12 DIAGNOSIS — M1990 Unspecified osteoarthritis, unspecified site: Secondary | ICD-10-CM | POA: Diagnosis not present

## 2017-03-12 DIAGNOSIS — Z95828 Presence of other vascular implants and grafts: Secondary | ICD-10-CM | POA: Diagnosis not present

## 2017-03-12 DIAGNOSIS — Z8673 Personal history of transient ischemic attack (TIA), and cerebral infarction without residual deficits: Secondary | ICD-10-CM | POA: Diagnosis not present

## 2017-03-12 LAB — CBC
HCT: 42.8 % (ref 39.0–52.0)
Hemoglobin: 13.6 g/dL (ref 13.0–17.0)
MCH: 28.5 pg (ref 26.0–34.0)
MCHC: 31.8 g/dL (ref 30.0–36.0)
MCV: 89.7 fL (ref 78.0–100.0)
PLATELETS: 147 10*3/uL — AB (ref 150–400)
RBC: 4.77 MIL/uL (ref 4.22–5.81)
RDW: 13.5 % (ref 11.5–15.5)
WBC: 4.1 10*3/uL (ref 4.0–10.5)

## 2017-03-12 LAB — BASIC METABOLIC PANEL
Anion gap: 8 (ref 5–15)
BUN: 10 mg/dL (ref 6–20)
CALCIUM: 8.8 mg/dL — AB (ref 8.9–10.3)
CHLORIDE: 106 mmol/L (ref 101–111)
CO2: 23 mmol/L (ref 22–32)
CREATININE: 1.18 mg/dL (ref 0.61–1.24)
GFR, EST NON AFRICAN AMERICAN: 59 mL/min — AB (ref >60)
Glucose, Bld: 117 mg/dL — ABNORMAL HIGH (ref 65–99)
Potassium: 3.8 mmol/L (ref 3.5–5.1)
SODIUM: 137 mmol/L (ref 135–145)

## 2017-03-12 LAB — MAGNESIUM: MAGNESIUM: 2.1 mg/dL (ref 1.7–2.4)

## 2017-03-12 LAB — PROTIME-INR
INR: 1.26
PROTHROMBIN TIME: 15.9 s — AB (ref 11.4–15.2)

## 2017-03-12 MED ORDER — OMEGA-3-ACID ETHYL ESTERS 1 G PO CAPS
1.0000 g | ORAL_CAPSULE | Freq: Every day | ORAL | Status: DC
  Administered 2017-03-12 – 2017-03-13 (×2): 1 g via ORAL
  Filled 2017-03-12 (×2): qty 1

## 2017-03-12 MED ORDER — AMLODIPINE BESYLATE 10 MG PO TABS
10.0000 mg | ORAL_TABLET | Freq: Every day | ORAL | Status: DC
  Administered 2017-03-12: 10 mg via ORAL
  Filled 2017-03-12: qty 1

## 2017-03-12 MED ORDER — ONDANSETRON HCL 4 MG/2ML IJ SOLN: 4.0000 mg | Freq: Four times a day (QID) | INTRAMUSCULAR | Status: DC | PRN

## 2017-03-12 MED ORDER — IRBESARTAN 300 MG PO TABS
300.0000 mg | ORAL_TABLET | Freq: Every day | ORAL | Status: DC
  Administered 2017-03-12 – 2017-03-13 (×2): 300 mg via ORAL
  Filled 2017-03-12 (×2): qty 1

## 2017-03-12 MED ORDER — ISOSORBIDE MONONITRATE ER 60 MG PO TB24
60.0000 mg | ORAL_TABLET | Freq: Every day | ORAL | Status: DC
  Administered 2017-03-12 – 2017-03-13 (×2): 60 mg via ORAL
  Filled 2017-03-12 (×2): qty 1

## 2017-03-12 MED ORDER — ASPIRIN EC 81 MG PO TBEC
81.0000 mg | DELAYED_RELEASE_TABLET | Freq: Every day | ORAL | Status: DC
  Administered 2017-03-12 – 2017-03-13 (×2): 81 mg via ORAL
  Filled 2017-03-12 (×2): qty 1

## 2017-03-12 MED ORDER — APIXABAN 5 MG PO TABS
5.0000 mg | ORAL_TABLET | Freq: Two times a day (BID) | ORAL | Status: DC
  Administered 2017-03-12 – 2017-03-13 (×3): 5 mg via ORAL
  Filled 2017-03-12 (×3): qty 1

## 2017-03-12 MED ORDER — POTASSIUM CHLORIDE CRYS ER 10 MEQ PO TBCR
10.0000 meq | EXTENDED_RELEASE_TABLET | Freq: Two times a day (BID) | ORAL | Status: DC
  Administered 2017-03-12 – 2017-03-13 (×3): 10 meq via ORAL
  Filled 2017-03-12 (×3): qty 1

## 2017-03-12 MED ORDER — ATORVASTATIN CALCIUM 80 MG PO TABS
80.0000 mg | ORAL_TABLET | Freq: Every day | ORAL | Status: DC
  Administered 2017-03-12: 80 mg via ORAL
  Filled 2017-03-12: qty 1

## 2017-03-12 MED ORDER — ACETAMINOPHEN 325 MG PO TABS: 650.0000 mg | ORAL_TABLET | ORAL | Status: DC | PRN

## 2017-03-12 MED ORDER — FUROSEMIDE 20 MG PO TABS
20.0000 mg | ORAL_TABLET | Freq: Every day | ORAL | Status: DC
  Administered 2017-03-12 – 2017-03-13 (×2): 20 mg via ORAL
  Filled 2017-03-12 (×2): qty 1

## 2017-03-12 MED ORDER — FAMOTIDINE 20 MG PO TABS
40.0000 mg | ORAL_TABLET | Freq: Two times a day (BID) | ORAL | Status: DC
  Administered 2017-03-12 – 2017-03-13 (×3): 40 mg via ORAL
  Filled 2017-03-12 (×3): qty 2

## 2017-03-12 NOTE — Progress Notes (Signed)
Reached out to EP; Chanetta Marshall said patient will be assigned to Dr. [name] to see - he agreed to see before clinic on Monday. Dayna Dunn PA-C

## 2017-03-12 NOTE — Progress Notes (Signed)
DAILY PROGRESS NOTE  Subjective:  Admitted with symptomatic PVC's - bradycardia. 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 Tikosyn held.  Potassium repleted to 4.1 today. Magnesium 2.1. Troponin negative. BNP normal.   Objective:  Temp:  [97.2 F (36.2 C)-97.9 F (36.6 C)] 97.9 F (36.6 C) (04/01 0500) Pulse Rate:  [29-86] 63 (04/01 0500) Resp:  [14-18] 16 (04/01 0500) BP: (105-146)/(71-90) 111/75 (04/01 0500) SpO2:  [96 %-100 %] 97 % (04/01 0500) Weight:  [215 lb 6.4 oz (97.7 kg)-225 lb (102.1 kg)] 215 lb 6.4 oz (97.7 kg) (04/01 0036) Weight change:   Intake/Output from previous day: 03/31 0701 - 04/01 0700 In: 420 [P.O.:420] Out: 1 [Urine:1]  Intake/Output from this shift: No intake/output data recorded.  Medications: No current facility-administered medications on file prior to encounter.    Current Outpatient Prescriptions on File Prior to Encounter  Medication Sig Dispense Refill  . amLODipine (NORVASC) 10 MG tablet TAKE 1 TABLET BY MOUTH EVERY DAY (Patient taking differently: TAKE 1 TABLET BY MOUTH EVERY DAY AT BEDTIME) 30 tablet 6  . apixaban (ELIQUIS) 5 MG TABS tablet Take 1 tablet (5 mg total) by mouth 2 (two) times daily. 180 tablet 3  . aspirin EC 81 MG tablet Take 81 mg by mouth daily.    Marland Kitchen atorvastatin (LIPITOR) 80 MG tablet Take 1 tablet (80 mg total) by mouth daily. (Patient taking differently: Take 80 mg by mouth at bedtime. ) 90 tablet 3  . carvedilol (COREG) 6.25 MG tablet Take 1 tablet (6.25 mg total) by mouth 2 (two) times daily with a meal. 60 tablet 11  . diltiazem (CARDIZEM) 30 MG tablet TAKE 1 TABLET EVERY 4 HOURS AS NEEDED FOR IRREGULAR HR>100 AND BP>100 45 tablet 2  . dofetilide (TIKOSYN) 250 MCG capsule Take 1 capsule (250 mcg total) by mouth 2 (two) times daily. 60 capsule 5  . famotidine (PEPCID) 40 MG tablet Take 1 tablet (40 mg total) by mouth 2 (two) times daily. 180 tablet 1  . fish  oil-omega-3 fatty acids 1000 MG capsule Take 1 g by mouth daily.     . furosemide (LASIX) 20 MG tablet Take 1 tablet (20 mg total) by mouth daily. 30 tablet 11  . irbesartan (AVAPRO) 300 MG tablet Take 1 tablet (300 mg total) by mouth daily. 30 tablet 2  . isosorbide mononitrate (IMDUR) 60 MG 24 hr tablet TAKE 1 TABLET BY MOUTH EVERY DAY 30 tablet 10  . loratadine (CLARITIN) 10 MG tablet Take 10 mg by mouth daily as needed (seasonal allergies).     . Multiple Vitamin (MULTIVITAMIN WITH MINERALS) TABS tablet Take 1 tablet by mouth daily.    . nitroGLYCERIN (NITROSTAT) 0.4 MG SL tablet Place 1 tablet (0.4 mg total) under the tongue every 5 (five) minutes as needed for chest pain (CP or SOB). (Patient taking differently: Place 0.4 mg under the tongue every 5 (five) minutes as needed for chest pain (shortness of breath). ) 25 tablet 12  . potassium chloride (KLOR-CON M10) 10 MEQ tablet Take 1 tablet (10 mEq total) by mouth 2 (two) times daily. 180 tablet 3    Physical Exam: General appearance: alert and no distress Lungs: clear to auscultation bilaterally Heart: irregularly irregular rhythm Extremities: extremities normal, atraumatic, no cyanosis or edema Neurologic: Grossly normal  Lab Results: Results for orders placed or performed during the hospital encounter of 03/11/17 (from the past 48 hour(s))  Basic metabolic panel  Status: Abnormal   Collection Time: 03/11/17  8:13 PM  Result Value Ref Range   Sodium 136 135 - 145 mmol/L   Potassium 3.8 3.5 - 5.1 mmol/L   Chloride 111 101 - 111 mmol/L   CO2 20 (L) 22 - 32 mmol/L   Glucose, Bld 100 (H) 65 - 99 mg/dL   BUN 11 6 - 20 mg/dL   Creatinine, Ser 1.12 0.61 - 1.24 mg/dL   Calcium 8.7 (L) 8.9 - 10.3 mg/dL   GFR calc non Af Amer >60 >60 mL/min   GFR calc Af Amer >60 >60 mL/min    Comment: (NOTE) The eGFR has been calculated using the CKD EPI equation. This calculation has not been validated in all clinical situations. eGFR's  persistently <60 mL/min signify possible Chronic Kidney Disease.    Anion gap 5 5 - 15  CBC     Status: Abnormal   Collection Time: 03/11/17  8:13 PM  Result Value Ref Range   WBC 3.6 (L) 4.0 - 10.5 K/uL   RBC 4.90 4.22 - 5.81 MIL/uL   Hemoglobin 14.2 13.0 - 17.0 g/dL   HCT 43.7 39.0 - 52.0 %   MCV 89.2 78.0 - 100.0 fL   MCH 29.0 26.0 - 34.0 pg   MCHC 32.5 30.0 - 36.0 g/dL   RDW 13.5 11.5 - 15.5 %   Platelets 152 150 - 400 K/uL  I-stat troponin, ED     Status: None   Collection Time: 03/11/17  8:22 PM  Result Value Ref Range   Troponin i, poc 0.00 0.00 - 0.08 ng/mL   Comment 3            Comment: Due to the release kinetics of cTnI, a negative result within the first hours of the onset of symptoms does not rule out myocardial infarction with certainty. If myocardial infarction is still suspected, repeat the test at appropriate intervals.   Urinalysis, Routine w reflex microscopic     Status: Abnormal   Collection Time: 03/11/17  9:05 PM  Result Value Ref Range   Color, Urine YELLOW YELLOW   APPearance CLEAR CLEAR   Specific Gravity, Urine 1.009 1.005 - 1.030   pH 5.0 5.0 - 8.0   Glucose, UA NEGATIVE NEGATIVE mg/dL   Hgb urine dipstick SMALL (A) NEGATIVE   Bilirubin Urine NEGATIVE NEGATIVE   Ketones, ur NEGATIVE NEGATIVE mg/dL   Protein, ur NEGATIVE NEGATIVE mg/dL   Nitrite NEGATIVE NEGATIVE   Leukocytes, UA NEGATIVE NEGATIVE   RBC / HPF 0-5 0 - 5 RBC/hpf   WBC, UA 0-5 0 - 5 WBC/hpf   Bacteria, UA NONE SEEN NONE SEEN   Squamous Epithelial / LPF NONE SEEN NONE SEEN  Magnesium     Status: None   Collection Time: 03/11/17  9:10 PM  Result Value Ref Range   Magnesium 2.1 1.7 - 2.4 mg/dL  TSH     Status: None   Collection Time: 03/11/17  9:10 PM  Result Value Ref Range   TSH 1.456 0.350 - 4.500 uIU/mL    Comment: Performed by a 3rd Generation assay with a functional sensitivity of <=0.01 uIU/mL.  Brain natriuretic peptide     Status: None   Collection Time: 03/11/17   9:10 PM  Result Value Ref Range   B Natriuretic Peptide 61.5 0.0 - 100.0 pg/mL  Basic metabolic panel     Status: Abnormal   Collection Time: 03/12/17  1:55 AM  Result Value Ref Range   Sodium  137 135 - 145 mmol/L   Potassium 3.8 3.5 - 5.1 mmol/L   Chloride 106 101 - 111 mmol/L   CO2 23 22 - 32 mmol/L   Glucose, Bld 117 (H) 65 - 99 mg/dL   BUN 10 6 - 20 mg/dL   Creatinine, Ser 1.18 0.61 - 1.24 mg/dL   Calcium 8.8 (L) 8.9 - 10.3 mg/dL   GFR calc non Af Amer 59 (L) >60 mL/min   GFR calc Af Amer >60 >60 mL/min    Comment: (NOTE) The eGFR has been calculated using the CKD EPI equation. This calculation has not been validated in all clinical situations. eGFR's persistently <60 mL/min signify possible Chronic Kidney Disease.    Anion gap 8 5 - 15  CBC     Status: Abnormal   Collection Time: 03/12/17  1:55 AM  Result Value Ref Range   WBC 4.1 4.0 - 10.5 K/uL   RBC 4.77 4.22 - 5.81 MIL/uL   Hemoglobin 13.6 13.0 - 17.0 g/dL   HCT 42.8 39.0 - 52.0 %   MCV 89.7 78.0 - 100.0 fL   MCH 28.5 26.0 - 34.0 pg   MCHC 31.8 30.0 - 36.0 g/dL   RDW 13.5 11.5 - 15.5 %   Platelets 147 (L) 150 - 400 K/uL  Protime-INR     Status: Abnormal   Collection Time: 03/12/17  1:55 AM  Result Value Ref Range   Prothrombin Time 15.9 (H) 11.4 - 15.2 seconds   INR 1.26   Magnesium     Status: None   Collection Time: 03/12/17  1:55 AM  Result Value Ref Range   Magnesium 2.1 1.7 - 2.4 mg/dL    Imaging: Dg Chest 2 View  Result Date: 03/11/2017 CLINICAL DATA:  Acute onset of generalized chest discomfort. Initial encounter. EXAM: CHEST  2 VIEW COMPARISON:  Chest radiograph performed 10/02/2016 FINDINGS: The lungs are well-aerated. Mild left basilar opacity likely reflects atelectasis or scarring. There is no evidence of pleural effusion or pneumothorax. The heart is normal in size; the mediastinal contour is within normal limits. No acute osseous abnormalities are seen. IMPRESSION: Mild left basilar airspace  opacity likely reflects atelectasis or scarring. Lungs otherwise clear. Electronically Signed   By: Garald Balding M.D.   On: 03/11/2017 21:25    Assessment:  Principal Problem:   Premature ventricular contractions (PVCs) (VPCs) Active Problems:   PAF (paroxysmal atrial fibrillation) ([place])   Plan:  1. Mr. Copher is likely symptomatic with breakthrough a-fib - he is having new findings of sinus bradycardia and couplets. This was concerning for tikosyn toxicity, but I'm not sure this is the case. 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 He reports fatigue. Recent URI and was on unknown antibiotic from Dr. Mancel Bale. Will continue to hold tikosyn - continue anticoagultion. Will d/w EP tomorrow about options including possible ablation and ?role of pacemaker.  Time Spent Directly with Patient:  15 minutes  Length of Stay:  LOS: 0 days   [name], MD, [facility] Attending Cardiologist [place] 03/12/2017, 10:49 AM

## 2017-03-13 DIAGNOSIS — I480 Paroxysmal atrial fibrillation: Secondary | ICD-10-CM | POA: Diagnosis not present

## 2017-03-13 MED ORDER — AMIODARONE HCL 200 MG PO TABS: 200.0000 mg | ORAL_TABLET | Freq: Every day | ORAL | 3 refills | Status: DC

## 2017-03-13 NOTE — Discharge Instructions (Signed)

## 2017-03-13 NOTE — Discharge Summary (Signed)
DISCHARGE SUMMARY    [name] ID: Rodney [name],  MRN: [id number], DOB/AGE: 75-Oct-1942 75 y.o.  Admit date: 03/11/2017 Discharge date: 03/13/2017  Primary Care Physician: Rodney Jacobson, MD  Primary Cardiologist: Dr. Debara [name] Electrophysiologist: New, Dr. Curt [name]  Primary Discharge Diagnosis:  1. Palpitations 2. Paroxysmal Afib 3. Bradycardia 4. PVC's  Secondary Discharge Diagnosis:  1. CAD     Last intervention March 2016 2. HTN 3. HLD 4. OSA, mild, no CPAP required  Allergies  Allergen Reactions  . Iohexol Other (See Comments)     Code: RASH, Desc: [name] STATED THAT HE BEGAN ITCHING TOWARDS END OF INJECTION OF IV CONTRAST-- 13 HR PREP RECOMMENDED/MMS   . Lisinopril Cough     Procedures This Admission:  None   Brief HPI: Rodney [name] is a 76 y.o. male was admitted to [facility] 03/11/17 with palpitations, and lightheadedness he suspected might be his Af though unusaly slower Rodney what he has noted historically, he was observed to have frequent PAC's, PVC's, and bradycardia, admitted for further evaluation and management with concerns particularly with PVC's given he was on Tikosyn.  Hospital Course:  Rodney [name] was admitted and monitored throughout his stay on telemetry which noted inus rhythm with episodes of atrial fibrillation. PVCs closely coupled with episodes of trigeminy.  His Tikosyn and coreg were both held upon admission.  His EKG did not note QT prolongation.  He was followed by cardiology service over Rodney weekend, there was mention of some degree of chest discomfort that was not a recurrent or ongoing complaint, his poc Trop negative, BNP/lytes were wnl.  Rodney [name] is on Eliquis for a/c and reported compliance without missed doses.   EP was consulted, Dr. Curt [name] saw this morning and discussed rhythm control strategy options with Rodney [name], and Rodney [name] plan to pursue AF ablation as his schedule permits, as an out [name].  Rodney [name] load on amiodarone.  I  discussed Rodney medications to be stopped and new medicine as well, to start Rodney amiodarone tomorrow AM (48 hours after last Tikosyn, [name] reported to be Saturday evening). Rodney [name] is feeling well this morning, VSS.  Rodney [name] was examined by Dr. Curt [name] this morning and considered stable for discharge to home. Rodney [name] Rodney [name] be contacted by Dr. Macky Lower RN to make arrangements/scheduling for his ablation procedure.   Physical Exam: Vitals:   03/12/17 1300 03/12/17 2000 03/13/17 0510 03/13/17 0942  BP: 126/86 (!) 141/79 109/67 119/74  Pulse: 70 (!) 49 (!) 57 (!) 58  Resp: 18  18   Temp:  98.8 F (37.1 C) 97.7 F (36.5 C)   TempSrc:  Oral Oral   SpO2: 100% 98%    Weight:   216 lb 4.8 oz (98.1 kg)   Height:         Labs:   Lab Results  Component Value Date   WBC 4.1 03/12/2017   HGB 13.6 03/12/2017   HCT 42.8 03/12/2017   MCV 89.7 03/12/2017   PLT 147 (L) 03/12/2017     Recent Labs Lab 03/12/17 0155  NA 137  K 3.8  CL 106  CO2 23  BUN 10  CREATININE 1.18  CALCIUM 8.8*  GLUCOSE 117*    Discharge Medications:  Allergies as of 03/13/2017      Reactions   Iohexol Other (See Comments)    Code: RASH, Desc: [name] STATED THAT HE BEGAN ITCHING TOWARDS END OF INJECTION OF IV CONTRAST-- 13 HR PREP RECOMMENDED/MMS  Lisinopril Cough      Medication List    STOP taking these medications   carvedilol 6.25 MG tablet Commonly known as:  COREG   dofetilide 250 MCG capsule Commonly known as:  TIKOSYN     TAKE these medications   amiodarone 200 MG tablet Commonly known as:  PACERONE Take 1 tablet (200 mg total) by mouth daily. Start by taking 2 tablets (400mg ) by mouth twice daily for 1 week, then reduce to 1 tablet (200mg ) by mouth twice daily for 1 week, then reduce to 1 table (200mg ) by mouth once daily. Notes to [name]:  Do not start until 03/14/17 morning    amLODipine 10 MG tablet Commonly known as:  NORVASC TAKE 1 TABLET BY MOUTH EVERY DAY What  changed:  See Rodney new instructions.   apixaban 5 MG Tabs tablet Commonly known as:  ELIQUIS Take 1 tablet (5 mg total) by mouth 2 (two) times daily.   aspirin EC 81 MG tablet Take 81 mg by mouth daily.   atorvastatin 80 MG tablet Commonly known as:  LIPITOR Take 1 tablet (80 mg total) by mouth daily. What changed:  when to take this   diltiazem 30 MG tablet Commonly known as:  CARDIZEM TAKE 1 TABLET EVERY 4 HOURS AS NEEDED FOR IRREGULAR HR>100 AND BP>100   famotidine 40 MG tablet Commonly known as:  PEPCID Take 1 tablet (40 mg total) by mouth 2 (two) times daily.   fish oil-omega-3 fatty acids 1000 MG capsule Take 1 g by mouth daily.   furosemide 20 MG tablet Commonly known as:  LASIX Take 1 tablet (20 mg total) by mouth daily.   irbesartan 300 MG tablet Commonly known as:  AVAPRO Take 1 tablet (300 mg total) by mouth daily.   isosorbide mononitrate 60 MG 24 hr tablet Commonly known as:  IMDUR TAKE 1 TABLET BY MOUTH EVERY DAY   loratadine 10 MG tablet Commonly known as:  CLARITIN Take 10 mg by mouth daily as needed (seasonal allergies).   multivitamin with minerals Tabs tablet Take 1 tablet by mouth daily.   nitroGLYCERIN 0.4 MG SL tablet Commonly known as:  NITROSTAT Place 1 tablet (0.4 mg total) under Rodney tongue every 5 (five) minutes as needed for chest pain (CP or SOB). What changed:  reasons to take this   potassium chloride 10 MEQ tablet Commonly known as:  KLOR-CON M10 Take 1 tablet (10 mEq total) by mouth 2 (two) times daily.       Disposition:  Home Discharge Instructions    Diet - low sodium heart healthy    Complete by:  As directed    Increase activity slowly    Complete by:  As directed      Follow-up Information    Rodney Pereyra Meredith Leeds, MD Follow up.   Specialty:  Cardiology Why:  Dr. Macky [name] nurse Rodney [name] call you to schdule/arrange your procedure.  Please call Rodney office if you have not been contacted by Friday. Contact  information: [address]  [place] [address] [phone]           Duration of Discharge Encounter: Greater than 30 minutes including physician time.  Signed, Rodney Standard, PA-C 03/13/2017 10:40 AM  I have seen and examined this [name] with Rodney [name].  Agree with above, note added to reflect my findings.  On exam, regular rhythm, no murmurs, lungs clear.  Return to Rodney hospital with atrial fibrillation found to have possible Tikosyn toxicity with an  increasing amount of PVCs. Has since been taken off his Tikosyn. He has had short runs of atrial fibrillation since that time. Rodney'll plan for atrial fibrillation ablation. Risks and benefits were discussed. Rodney [name] plan for discharge from Rodney hospital today on amiodarone with follow-up in clinic.  Rodney [name] M. Caitlyne Ingham MD 03/13/2017 12:20 PM

## 2017-03-13 NOTE — Consult Note (Addendum)
CARDIOLOGY CONSULT NOTE     Primary Care Physician: [name], MD Referring Physician:  Admit Date: 03/11/2017  Reason for consultation:  Rodney [name] is a 76 y.o. male with a h/o atrial fibrillation. Rodney [name] is a 76 y.o. male who is being seen today for the evaluation of atrial fibrillation at the request of Lyman Bishop. This relatively been on Tikosyn 250 g twice a day and reports good compliance with medication. He has had brief recurrences of atrial fibrillation in the past. He presented to the hospital thinking that he was back in atrial fibrillation with heart rates in the 50s, usually they are in the 80s to 90s in atrial fibrillation, and lightheadedness. On arrival to the ER his vital signs were stable with heart rate of 86. EKG showed sinus rhythm with frequent APCs and a QTC of 450. Telemetry demonstrated frequent periods of bradycardia with closely coupled PVCs and occasional triplets. His Tikosyn was stopped by the cardiology team. Says that when he is in atrial fibrillation, he feels fatigue and weakness.   Past Medical History:  Diagnosis Date  . AAA (abdominal aortic aneurysm) ([place])    stent graft put in 02/2009  . Arthritis   . CAD (coronary artery disease)    a. cath 2010 b. CAD s/p PCI to ramus and LCX on 03/04/15  . Gout   . Hyperlipidemia   . Hypertension   . OSA (obstructive sleep apnea) 09/30/2015   Very mild with AHI 6.2/hr  . Paroxysmal atrial fibrillation ([facility])   . Tobacco abuse    Past Surgical History:  Procedure Laterality Date  . ABDOMINAL AORTIC ANEURYSM REPAIR  02/19/2009   performed by [facility]  . COLONOSCOPY    . KNEE ARTHROSCOPY  2003   left  . LEFT AND RIGHT HEART CATHETERIZATION WITH CORONARY ANGIOGRAM N/A 03/03/2015   Procedure: LEFT AND RIGHT HEART CATHETERIZATION WITH CORONARY ANGIOGRAM;  Surgeon: Troy Sine, MD;    . PERCUTANEOUS CORONARY STENT INTERVENTION (PCI-S) N/A 03/04/2015   Procedure: PERCUTANEOUS CORONARY STENT  INTERVENTION (PCI-S);  Surgeon: Troy Sine, MD; RI 99>>0% w/   BMS, bifurcation CFX-OM 90>>40% w/ Angiosculpt PTCA; dCFX 90>>0% w/   2.7520 mm Rebel BMS  . SHOULDER ARTHROSCOPY WITH ROTATOR CUFF REPAIR AND SUBACROMIAL DECOMPRESSION Left 11/19/2013   Procedure: LEFT SHOULDER ARTHROSCOPY DEBRIDEMENT EXTENTSIVE DISTAL CLAVICULECTOMY DECOMPRESSION PARTIAL ACROMIOPLASTY WITH CORACOACROMIAL WITH ROTATOR CUFF REPAIR ;  Surgeon: Renette Butters, MD;  Location: [facility];  Service: Orthopedics;  Laterality: Left;    . amLODipine  10 mg Oral QHS  . apixaban  5 mg Oral BID  . aspirin EC  81 mg Oral Daily  . atorvastatin  80 mg Oral QHS  . famotidine  40 mg Oral BID  . furosemide  20 mg Oral Daily  . irbesartan  300 mg Oral Daily  . isosorbide mononitrate  60 mg Oral Daily  . omega-3 acid ethyl esters  1 g Oral Daily  . potassium chloride  10 mEq Oral BID     Allergies  Allergen Reactions  . Iohexol Other (See Comments)     Code: RASH, Desc: PATIENT STATED THAT HE BEGAN ITCHING TOWARDS END OF INJECTION OF IV CONTRAST-- 13 HR PREP RECOMMENDED/MMS   . Lisinopril Cough    Social History   Social History  . Marital status: Married    Spouse name: N/A  . Number of children: N/A  . Years of education: N/A   Occupational History  .  Not on file.   Social History Main Topics  . Smoking status: Former Smoker    Packs/day: 0.50    Years: 50.00    Types: Cigarettes    Quit date: 03/01/2016  . Smokeless tobacco: Never Used  . Alcohol use 4.2 - 4.8 oz/week    6 Cans of beer, 1 - 2 Shots of liquor per week     Comment: couple shots of royal per day  . Drug use: No  . Sexual activity: Not Currently     Comment: cutting down 1/2 ppd   Other Topics Concern  . Not on file   Social History Narrative   Lives with wife, does not use assist, drives.  No home health services.      Family History  Problem Relation Age of Onset  . Prostate cancer Father   . Hypertension  Mother   . Diabetes Sister   . Hypertension Sister   . Stroke Neg Hx   . Heart attack Neg Hx     ROS- All systems are reviewed and negative except as per the HPI above  Physical Exam: Telemetry: Vitals:   03/12/17 0500 03/12/17 1300 03/12/17 2000 03/13/17 0510  BP: 111/75 126/86 (!) 141/79 109/67  Pulse: 63 70 (!) 49 (!) 57  Resp: 16 18  18   Temp: 97.9 F (36.6 C)  98.8 F (37.1 C) 97.7 F (36.5 C)  TempSrc: Oral  Oral Oral  SpO2: 97% 100% 98%   Weight:    216 lb 4.8 oz (98.1 kg)  Height:        GEN- The patient is well appearing, alert and oriented x 3 today.   Head- normocephalic, atraumatic Eyes-  Sclera clear, conjunctiva pink Ears- hearing intact Oropharynx- clear Neck- supple, no JVP Lymph- no cervical lymphadenopathy Lungs- Clear to ausculation bilaterally, normal work of breathing Heart- Regular rate and rhythm, no murmurs, rubs or gallops, PMI not laterally displaced GI- soft, NT, ND, + BS Extremities- no clubbing, cyanosis, or edema MS- no significant deformity or atrophy Skin- no rash or lesion Psych- euthymic mood, full affect Neuro- strength and sensation are intact  EKG: sinus rhythm- personally reviewed  Telemetry: Sinus rhythm with episodes of atrial fibrillation. PVCs closely coupled with episodes of trigeminy. Personally  reviewed  Labs:   Lab Results  Component Value Date   WBC 4.1 03/12/2017   HGB 13.6 03/12/2017   HCT 42.8 03/12/2017   MCV 89.7 03/12/2017   PLT 147 (L) 03/12/2017    Recent Labs Lab 03/12/17 0155  NA 137  K 3.8  CL 106  CO2 23  BUN 10  CREATININE 1.18  CALCIUM 8.8*  GLUCOSE 117*   Lab Results  Component Value Date   TROPONINI <0.03 03/17/2015    Lab Results  Component Value Date   CHOL 125 02/21/2015   Lab Results  Component Value Date   HDL 43 02/21/2015   Lab Results  Component Value Date   LDLCALC 47 02/21/2015   Lab Results  Component Value Date   TRIG 173 (H) 02/21/2015   Lab Results    Component Value Date   CHOLHDL 2.9 02/21/2015   No results found for: LDLDIRECT    Radiology: Mild left basilar airspace opacity likely reflects atelectasis or scarring. Lungs otherwise clear  Echo:- Left ventricle: The cavity size was mildly dilated. Wall   thickness was increased in a pattern of mild LVH. Systolic   function was mildly reduced. The estimated ejection fraction was  in the range of 45% to 50%. Diffuse hypokinesis. Doppler   parameters are consistent with abnormal left ventricular   relaxation (grade 1 diastolic dysfunction). - Aortic root: The aortic root was mildly dilated. - Left atrium: The atrium was mildly dilated. - Pulmonary arteries: Systolic pressure was mildly to moderately   increased. PA peak pressure: 44 mm Hg (S).  ASSESSMENT AND PLAN:   1. Paroxysmal atrial fibrillation: Is compliant with his Eliquis. Has since been taken off of his Tikosyn. I discussed with him the possibility of further rhythm control for his atrial fibrillation including ablation. Risks and benefits of ablation were discussed. Risks include bleeding, tamponade, heart block, stroke, and damage to surrounding organs. He understands these risks and has agreed to the procedure. In the interim, to hopefully prevent his symptoms of atrial fibrillation, we'll start him on amiodarone 400 mg twice a day for a week followed by 200 mg twice a day for a week followed by 200 mg a day.  2. Coronary artery disease: Status post PCI 2016. No current chest pain.  3. AAA: Status post stenting. Continue current management.  4. Ischemic cardiomyopathy: appears well compensated.  Christepher Melchior Meredith Leeds, MD 03/13/2017  8:50 AM

## 2017-03-16 ENCOUNTER — Telehealth: Payer: Self-pay | Admitting: *Deleted

## 2017-03-16 NOTE — Telephone Encounter (Signed)
Follow up   Pt is returning call to [facility]

## 2017-03-16 NOTE — Telephone Encounter (Signed)
lmtcb to schedule AFib ablation 

## 2017-03-16 NOTE — Telephone Encounter (Signed)
Scheduled AFib  Ablation for 5/18. Pt understands I will arrange procedure and call him tomorrow//next week to review instructions.

## 2017-03-18 ENCOUNTER — Emergency Department (HOSPITAL_COMMUNITY)
Admission: EM | Admit: 2017-03-18 | Discharge: 2017-03-18 | Disposition: A | Discharge status: Home / Self Care | Payer: Medicare Other | Attending: Emergency Medicine | Admitting: Emergency Medicine

## 2017-03-18 ENCOUNTER — Emergency Department (HOSPITAL_COMMUNITY): Payer: Medicare Other

## 2017-03-18 ENCOUNTER — Encounter (HOSPITAL_COMMUNITY): Payer: Self-pay | Admitting: *Deleted

## 2017-03-18 DIAGNOSIS — I110 Hypertensive heart disease with heart failure: Secondary | ICD-10-CM | POA: Diagnosis not present

## 2017-03-18 DIAGNOSIS — I4891 Unspecified atrial fibrillation: Secondary | ICD-10-CM | POA: Insufficient documentation

## 2017-03-18 DIAGNOSIS — Z7901 Long term (current) use of anticoagulants: Secondary | ICD-10-CM | POA: Insufficient documentation

## 2017-03-18 DIAGNOSIS — I2510 Atherosclerotic heart disease of native coronary artery without angina pectoris: Secondary | ICD-10-CM | POA: Diagnosis not present

## 2017-03-18 DIAGNOSIS — Z7982 Long term (current) use of aspirin: Secondary | ICD-10-CM | POA: Insufficient documentation

## 2017-03-18 DIAGNOSIS — Z87891 Personal history of nicotine dependence: Secondary | ICD-10-CM | POA: Insufficient documentation

## 2017-03-18 DIAGNOSIS — I5031 Acute diastolic (congestive) heart failure: Secondary | ICD-10-CM | POA: Insufficient documentation

## 2017-03-18 DIAGNOSIS — R002 Palpitations: Secondary | ICD-10-CM | POA: Diagnosis present

## 2017-03-18 DIAGNOSIS — Z8673 Personal history of transient ischemic attack (TIA), and cerebral infarction without residual deficits: Secondary | ICD-10-CM | POA: Insufficient documentation

## 2017-03-18 LAB — BASIC METABOLIC PANEL
Anion gap: 9 (ref 5–15)
BUN: 12 mg/dL (ref 6–20)
CALCIUM: 8.9 mg/dL (ref 8.9–10.3)
CHLORIDE: 108 mmol/L (ref 101–111)
CO2: 23 mmol/L (ref 22–32)
CREATININE: 1.42 mg/dL — AB (ref 0.61–1.24)
GFR, EST AFRICAN AMERICAN: 55 mL/min — AB (ref >60)
GFR, EST NON AFRICAN AMERICAN: 47 mL/min — AB (ref >60)
Glucose, Bld: 142 mg/dL — ABNORMAL HIGH (ref 65–99)
Potassium: 4.4 mmol/L (ref 3.5–5.1)
SODIUM: 140 mmol/L (ref 135–145)

## 2017-03-18 LAB — CBC
HCT: 42.6 % (ref 39.0–52.0)
Hemoglobin: 14.5 g/dL (ref 13.0–17.0)
MCH: 30.9 pg (ref 26.0–34.0)
MCHC: 34 g/dL (ref 30.0–36.0)
MCV: 90.6 fL (ref 78.0–100.0)
Platelets: 150 10*3/uL (ref 150–400)
RBC: 4.7 MIL/uL (ref 4.22–5.81)
RDW: 13.6 % (ref 11.5–15.5)
WBC: 4 10*3/uL (ref 4.0–10.5)

## 2017-03-18 LAB — I-STAT TROPONIN, ED: TROPONIN I, POC: 0 ng/mL (ref 0.00–0.08)

## 2017-03-18 LAB — TROPONIN I

## 2017-03-18 NOTE — ED Provider Notes (Signed)
[facility] DEPT Provider Note   CSN: [id number] Arrival date & time: 03/18/17  1157     History   Chief Complaint Chief Complaint  Patient presents with  . Atrial Fibrillation    HPI Rodney [name] is a 76 y.o. male.  HPI   76 year old male history of paroxysmal atrial fibrillation who was recently hospitalized and had medications changed and he was started on amiodarone and amlodipine. His tikosyn and carvedilol has been stopped. He felt some palpitations this morning. He took his 30 mg of Cardizem and he has been instructed to take if he notes that his heart rate has gone up. His heart rate was 104. He states that his diastolic blood pressure was 1:15. He had some mild chest tightness. He did not have any dyspnea. Her to previous symptoms. He is currently asymptomatic. He states that he quit drinking but continues to smoke.  Past Medical History:  Diagnosis Date  . AAA (abdominal aortic aneurysm) ([place])    stent graft put in 02/2009  . Arthritis   . CAD (coronary artery disease)    a. cath 2010 b. CAD s/p PCI to ramus and LCX on 03/04/15  . Gout   . Hyperlipidemia   . Hypertension   . OSA (obstructive sleep apnea) 09/30/2015   Very mild with AHI 6.2/hr  . Paroxysmal atrial fibrillation ([facility])   . Tobacco abuse     Patient Active Problem List   Diagnosis Date Noted  . Premature ventricular contractions (PVCs) (VPCs) 03/11/2017  . H/O endovascular stent graft for abdominal aortic aneurysm 09/01/2016  . Cardiomyopathy, ischemic 03/30/2016  . OSA (obstructive sleep apnea) 09/30/2015  . Encounter for monitoring anti-arrhythmic therapy 05/04/2015  . DOE (dyspnea on exertion) 03/16/2015  . Atrial fibrillation with RVR ([place]) 03/16/2015  . Hypertensive urgency 03/08/2015  . Coronary artery disease due to lipid rich plaque   . AA (aortic aneurysm) ([place])   . Dyspnea 03/02/2015  . Progressive angina ([place]) 03/02/2015  . History of TIA (transient ischemic attack), 02/20/15  03/02/2015  . Mitral regurgitation 03/02/2015  . HX: anticoagulation 03/02/2015  . Chest tightness   . Tobacco abuse   . HLD (hyperlipidemia)   . TIA (transient ischemic attack) 02/20/2015  . 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 Chronic combined systolic and diastolic heart failure, NYHA class 1 ([place]) 02/20/2015  . PAF (paroxysmal atrial fibrillation) ([place]) 01/29/2015  . Accelerated hypertension, hx of 01/03/2015  . Acute diastolic CHF (congestive heart failure), NYHA class 2 ([place]) 01/03/2015  . Demand ischemia ([place]) 01/03/2015  . CAD (coronary artery disease) stent place 03/04/15 01/03/2015  . Troponin level elevated   . Thrombocytopenia ([place])   . Essential hypertension   . S/P abdominal aortic aneurysm repair 07/03/2012    Past Surgical History:  Procedure Laterality Date  . ABDOMINAL AORTIC ANEURYSM REPAIR  02/19/2009   performed by [facility]  . COLONOSCOPY    . KNEE ARTHROSCOPY  2003   left  . LEFT AND RIGHT HEART CATHETERIZATION WITH CORONARY ANGIOGRAM N/A 03/03/2015   Procedure: LEFT AND RIGHT HEART CATHETERIZATION WITH CORONARY ANGIOGRAM;  Surgeon: Troy Sine, MD;    . PERCUTANEOUS CORONARY STENT INTERVENTION (PCI-S) N/A 03/04/2015   Procedure: PERCUTANEOUS CORONARY STENT INTERVENTION (PCI-S);  Surgeon: Troy Sine, MD; RI 99>>0% w/   BMS, bifurcation CFX-OM 90>>40% w/ Angiosculpt PTCA; dCFX 90>>0% w/   2.7520 mm Rebel BMS  . SHOULDER ARTHROSCOPY WITH ROTATOR CUFF REPAIR AND SUBACROMIAL DECOMPRESSION Left 11/19/2013   Procedure: LEFT SHOULDER ARTHROSCOPY DEBRIDEMENT  EXTENTSIVE DISTAL CLAVICULECTOMY DECOMPRESSION PARTIAL ACROMIOPLASTY WITH CORACOACROMIAL WITH ROTATOR CUFF REPAIR ;  Surgeon: Renette Butters, MD;  Location: [facility];  Service: Orthopedics;  Laterality: Left;       Home Medications    Prior to Admission medications   Medication Sig Start Date End Date Taking? Authorizing Provider  amiodarone (PACERONE) 200 MG tablet Take 1 tablet (200 mg total) by  mouth daily. Start by taking 2 tablets (400mg ) by mouth twice daily for 1 week, then reduce to 1 tablet (200mg ) by mouth twice daily for 1 week, then reduce to 1 table (200mg ) by mouth once daily. 03/13/17  Yes Renee Dyane Dustman, PA-C  amLODipine (NORVASC) 10 MG tablet TAKE 1 TABLET BY MOUTH EVERY DAY Patient taking differently: TAKE 1 TABLET BY MOUTH EVERY DAY AT BEDTIME 09/07/16  Yes [name], MD  apixaban (ELIQUIS) 5 MG TABS tablet Take 1 tablet (5 mg total) by mouth 2 (two) times daily. 05/26/15  Yes [name], MD  aspirin EC 81 MG tablet Take 81 mg by mouth daily.   Yes Historical Provider, MD  atorvastatin (LIPITOR) 80 MG tablet Take 1 tablet (80 mg total) by mouth daily. Patient taking differently: Take 80 mg by mouth at bedtime.  05/21/15  Yes [name], MD  diltiazem (CARDIZEM) 30 MG tablet TAKE 1 TABLET EVERY 4 HOURS AS NEEDED FOR IRREGULAR HR>100 AND BP>100 01/02/17  Yes Sherran Needs, NP  famotidine (PEPCID) 40 MG tablet Take 1 tablet (40 mg total) by mouth 2 (two) times daily. 10/19/15  Yes [name], MD  fish oil-omega-3 fatty acids 1000 MG capsule Take 1 g by mouth daily.    Yes Historical Provider, MD  furosemide (LASIX) 20 MG tablet Take 1 tablet (20 mg total) by mouth daily. 03/30/16  Yes [name], MD  irbesartan (AVAPRO) 300 MG tablet Take 1 tablet (300 mg total) by mouth daily. 02/23/17  Yes [name], MD  isosorbide mononitrate (IMDUR) 60 MG 24 hr tablet TAKE 1 TABLET BY MOUTH EVERY DAY 04/25/16  Yes [name], MD  loratadine (CLARITIN) 10 MG tablet Take 10 mg by mouth daily as needed (seasonal allergies).    Yes Historical Provider, MD  Multiple Vitamin (MULTIVITAMIN WITH MINERALS) TABS tablet Take 1 tablet by mouth daily.   Yes Historical Provider, MD  nitroGLYCERIN (NITROSTAT) 0.4 MG SL tablet Place 1 tablet (0.4 mg total) under the tongue every 5 (five) minutes as needed for chest pain (CP or SOB). Patient taking differently: Place 0.4 mg  under the tongue every 5 (five) minutes as needed for chest pain (shortness of breath).  03/09/15  Yes Brett Canales, PA-C  potassium chloride (KLOR-CON M10) 10 MEQ tablet Take 1 tablet (10 mEq total) by mouth 2 (two) times daily. 11/21/16  Yes Sherran Needs, NP    Family History Family History  Problem Relation Age of Onset  . Prostate cancer Father   . Hypertension Mother   . Diabetes Sister   . Hypertension Sister   . Stroke Neg Hx   . Heart attack Neg Hx     Social History Social History  Substance Use Topics  . Smoking status: Former Smoker    Packs/day: 0.50    Years: 50.00    Types: Cigarettes    Quit date: 03/01/2016  . Smokeless tobacco: Never Used  . Alcohol use 4.2 - 4.8 oz/week    6 Cans of beer, 1 -  2 Shots of liquor per week     Comment: couple shots of royal per day     Allergies   Iohexol and Lisinopril   Review of Systems Review of Systems  All other systems reviewed and are negative.    Physical Exam Updated Vital Signs BP 125/65   Pulse (!) 59   Temp 97.7 F (36.5 C) (Oral)   Resp 17   SpO2 94%   Physical Exam  Constitutional: He is oriented to person, place, and time. He appears well-developed and well-nourished.  HENT:  Head: Normocephalic and atraumatic.  Right Ear: External ear normal.  Left Ear: External ear normal.  Nose: Nose normal.  Mouth/Throat: Oropharynx is clear and moist.  Eyes: Conjunctivae and EOM are normal. Pupils are equal, round, and reactive to light.  Neck: Normal range of motion. Neck supple.  Cardiovascular: Normal rate, regular rhythm, normal heart sounds and intact distal pulses.   Pulmonary/Chest: Effort normal and breath sounds normal.  Abdominal: Soft. Bowel sounds are normal.  Musculoskeletal: Normal range of motion.  Neurological: He is alert and oriented to person, place, and time. He has normal reflexes.  Skin: Skin is warm and dry.  Psychiatric: He has a normal mood and affect. His behavior is  normal. Judgment and thought content normal.  Nursing note and vitals reviewed.    ED Treatments / Results  Labs (all labs ordered are listed, but only abnormal results are displayed) Labs Reviewed  BASIC METABOLIC PANEL - Abnormal; Notable for the following:       Result Value   Glucose, Bld 142 (*)    Creatinine, Ser 1.42 (*)    GFR calc non Af Amer 47 (*)    GFR calc Af Amer 55 (*)    All other components within normal limits  CBC  TROPONIN I    EKG  EKG Interpretation  Date/Time:  Saturday March 18 2017 12:07:20 EDT Ventricular Rate:  105 PR Interval:    QRS Duration: 94 QT Interval:  384 QTC Calculation: 507 R Axis:   -19 Text Interpretation:  Atrial fibrillation with rapid ventricular response ST & T wave abnormality, consider lateral ischemia Abnormal ECG Confirmed by Dayla Gasca MD, Andee Poles [id number]) on 03/18/2017 12:23:56 PM       Radiology Dg Chest 2 View  Result Date: 03/18/2017 CLINICAL DATA:  76 year old male with history of atrial fibrillation EXAM: CHEST  2 VIEW COMPARISON:  03/11/2017, 10/02/2016 FINDINGS: Cardiomediastinal silhouette unchanged. No pneumothorax or pleural effusion. Coarsened interstitial markings, similar to comparison studies. No confluent airspace disease. No displaced fracture IMPRESSION: Background of chronic lung changes without evidence of superimposed acute cardiopulmonary disease Electronically Signed   By: Corrie Mckusick D.O.   On: 03/18/2017 13:06    Procedures Procedures (including critical care time)  Medications Ordered in ED Medications - No data to display   Initial Impression / Assessment and Plan / ED Course  I have reviewed the triage vital signs and the nursing notes.  Pertinent labs & imaging results that were available during my care of the patient were reviewed by me and considered in my medical decision making (see chart for details).   since initial EKG here was significant for atrial fib with RVR. In the interim, before  my evaluation, patient appears to have converted to normal sinus rhythm. He appears to be in sinus rhythm at a rate at about 80 on the monitor. An repeat EKG. Will assess labs and may DC home if continued normal. The  patient is scheduled for a shunt as an outpatient.  Final Clinical Impressions(s) / ED Diagnoses   Final diagnoses:  Atrial fibrillation with RVR [facility])    New Prescriptions New Prescriptions   No medications on file     Pattricia Boss, MD 03/18/17 [phone]

## 2017-03-18 NOTE — ED Notes (Signed)
Pt to xray

## 2017-03-18 NOTE — Discharge Instructions (Signed)
Call your cardiologist tomorrow for a recheck. Take your cardizem if your heart rate is elevated.

## 2017-03-18 NOTE — ED Triage Notes (Signed)
Pt reports hx of afib, was in hospital recently and had medications changed. Today reports irregular HR > 100 and HTN. Pt took his cardizem pta. No acute distress is noted at triage, ekg done.

## 2017-03-20 ENCOUNTER — Telehealth: Payer: Self-pay | Admitting: Cardiology

## 2017-03-20 ENCOUNTER — Emergency Department (HOSPITAL_COMMUNITY): Payer: Medicare Other

## 2017-03-20 ENCOUNTER — Encounter (HOSPITAL_COMMUNITY): Payer: Self-pay | Admitting: Emergency Medicine

## 2017-03-20 DIAGNOSIS — Z7901 Long term (current) use of anticoagulants: Secondary | ICD-10-CM | POA: Insufficient documentation

## 2017-03-20 DIAGNOSIS — R0789 Other chest pain: Secondary | ICD-10-CM | POA: Diagnosis present

## 2017-03-20 DIAGNOSIS — I110 Hypertensive heart disease with heart failure: Secondary | ICD-10-CM | POA: Diagnosis not present

## 2017-03-20 DIAGNOSIS — Z8673 Personal history of transient ischemic attack (TIA), and cerebral infarction without residual deficits: Secondary | ICD-10-CM | POA: Insufficient documentation

## 2017-03-20 DIAGNOSIS — I2510 Atherosclerotic heart disease of native coronary artery without angina pectoris: Secondary | ICD-10-CM | POA: Diagnosis not present

## 2017-03-20 DIAGNOSIS — I480 Paroxysmal atrial fibrillation: Secondary | ICD-10-CM

## 2017-03-20 DIAGNOSIS — Z01812 Encounter for preprocedural laboratory examination: Secondary | ICD-10-CM

## 2017-03-20 DIAGNOSIS — I5042 Chronic combined systolic (congestive) and diastolic (congestive) heart failure: Secondary | ICD-10-CM | POA: Insufficient documentation

## 2017-03-20 DIAGNOSIS — Z87891 Personal history of nicotine dependence: Secondary | ICD-10-CM | POA: Insufficient documentation

## 2017-03-20 DIAGNOSIS — Z7982 Long term (current) use of aspirin: Secondary | ICD-10-CM | POA: Insufficient documentation

## 2017-03-20 DIAGNOSIS — Z79899 Other long term (current) drug therapy: Secondary | ICD-10-CM | POA: Insufficient documentation

## 2017-03-20 LAB — CBC
HCT: 43.3 % (ref 39.0–52.0)
Hemoglobin: 14.7 g/dL (ref 13.0–17.0)
MCH: 30.6 pg (ref 26.0–34.0)
MCHC: 33.9 g/dL (ref 30.0–36.0)
MCV: 90 fL (ref 78.0–100.0)
Platelets: 152 10*3/uL (ref 150–400)
RBC: 4.81 MIL/uL (ref 4.22–5.81)
RDW: 13.3 % (ref 11.5–15.5)
WBC: 5.1 10*3/uL (ref 4.0–10.5)

## 2017-03-20 LAB — BASIC METABOLIC PANEL
ANION GAP: 9 (ref 5–15)
BUN: 12 mg/dL (ref 6–20)
CALCIUM: 9.1 mg/dL (ref 8.9–10.3)
CO2: 23 mmol/L (ref 22–32)
Chloride: 107 mmol/L (ref 101–111)
Creatinine, Ser: 1.44 mg/dL — ABNORMAL HIGH (ref 0.61–1.24)
GFR calc Af Amer: 54 mL/min — ABNORMAL LOW (ref >60)
GFR, EST NON AFRICAN AMERICAN: 46 mL/min — AB (ref >60)
GLUCOSE: 146 mg/dL — AB (ref 65–99)
Potassium: 3.8 mmol/L (ref 3.5–5.1)
Sodium: 139 mmol/L (ref 135–145)

## 2017-03-20 LAB — I-STAT TROPONIN, ED: TROPONIN I, POC: 0 ng/mL (ref 0.00–0.08)

## 2017-03-20 NOTE — Telephone Encounter (Signed)
Pt called because he states has been taking the Pacerone medication as instructed " take 400 mg twice a day for a week then 200 mg twice a week for one week" Today is pt's  7 th day of taking the 400 mg of  Pacerone, and  he continues to be  in A-Fib.  Pt's heart rate early today was 113 beat/ minute now is 72 beats/minute, BP 151/79. According to pt  is scheduled for cardioversion next month. He would like to know if he can be scheduled for cardioversion sooner if possible.

## 2017-03-20 NOTE — ED Triage Notes (Signed)
Pt states he has hx of afib and has been in it today at home. Pt states he feels SOB, chest tightness. Pt states his BP at home was 811 systolic.

## 2017-03-20 NOTE — ED Triage Notes (Signed)
Pt is not in Afib in triage

## 2017-03-20 NOTE — Telephone Encounter (Signed)
New Message  Pt c/o BP issue: STAT if pt c/o blurred vision, one-sided weakness or slurred speech  1. What are your last 5 BP readings? Pt voiced his lower number was in the high 90's  2. Are you having any other symptoms (ex. Dizziness, headache, blurred vision, passed out)? Pt went to ED  3. What is your BP issue? Pt voiced his bp is way too high and needs to speak to someone. Pt voiced he also was in ED this weekend for afib. Pt thinks it's the medication he's only been on for a week.  Pt c/o medication issue:  1. Name of Medication: amiodarone (Pacerone) 200 mg tablets twice daily, then reduce to one tablet after a week once daily  2. How are you currently taking this medication (dosage and times per day)? See above  3. Are you having a reaction (difficulty breathing--STAT)? N/A  4. What is your medication issue? Pt voiced thinking this medication is causing him to have BP and HR problems

## 2017-03-21 ENCOUNTER — Emergency Department (HOSPITAL_COMMUNITY)
Admission: EM | Admit: 2017-03-21 | Discharge: 2017-03-21 | Disposition: A | Discharge status: Home / Self Care | Payer: Medicare Other | Attending: Emergency Medicine | Admitting: Emergency Medicine

## 2017-03-21 DIAGNOSIS — I10 Essential (primary) hypertension: Secondary | ICD-10-CM

## 2017-03-21 DIAGNOSIS — R0789 Other chest pain: Secondary | ICD-10-CM

## 2017-03-21 LAB — I-STAT TROPONIN, ED: TROPONIN I, POC: 0 ng/mL (ref 0.00–0.08)

## 2017-03-21 NOTE — ED Provider Notes (Signed)
[facility] DEPT Provider Note   CSN: [id number] Arrival date & time: 03/20/17  1841  By signing my name below, I, Georgette Shell, attest that this documentation has been prepared under the direction and in the presence of Merryl Hacker, MD. Electronically Signed: Georgette Shell, ED Scribe. 03/21/17. 2:30 AM.  History   Chief Complaint Chief Complaint  Patient presents with  . Chest Pain    HPI The history is provided by the patient. No language interpreter was used.   HPI Comments: Rodney [name] is a 76 y.o. male with h/o AAA, CAD, CHF, TIA, HTN, HLD, A-fib, and tobacco abuse, who presents to the Emergency Department complaining of elevated blood pressure beginning yesterday. Pt notes that his BP has been elevated yesterday and today, stating that his BP at home was 811 systolic. He further reports that he has been feeling chest tightness and shortness of breath worse. Currently symptom-free. States that his shortness of breath is not so bad but in combination with his blood pressure, this concerned him. He takes his blood pressure routinely 3 times per day. He has had some recent changes in his blood pressure medications. Pt reports he has h/o A-fib and is scheduled for an ablation soon. Pt denies fever, chills, or any other associated symptoms. Pt notes his symptoms seem to be resolved at this time.   PCP: [name], MD Cardiologist: Constance Haw, MD  Past Medical History:  Diagnosis Date  . AAA (abdominal aortic aneurysm) ([place])    stent graft put in 02/2009  . Arthritis   . CAD (coronary artery disease)    a. cath 2010 b. CAD s/p PCI to ramus and LCX on 03/04/15  . Gout   . Hyperlipidemia   . Hypertension   . OSA (obstructive sleep apnea) 09/30/2015   Very mild with AHI 6.2/hr  . Paroxysmal atrial fibrillation ([facility])   . Tobacco abuse     Patient Active Problem List   Diagnosis Date Noted  . Premature ventricular contractions (PVCs) (VPCs) 03/11/2017  . H/O  endovascular stent graft for abdominal aortic aneurysm 09/01/2016  . Cardiomyopathy, ischemic 03/30/2016  . OSA (obstructive sleep apnea) 09/30/2015  . Encounter for monitoring anti-arrhythmic therapy 05/04/2015  . DOE (dyspnea on exertion) 03/16/2015  . Atrial fibrillation with RVR ([place]) 03/16/2015  . Hypertensive urgency 03/08/2015  . Coronary artery disease due to lipid rich plaque   . AA (aortic aneurysm) ([place])   . Dyspnea 03/02/2015  . Progressive angina ([place]) 03/02/2015  . History of TIA (transient ischemic attack), 02/20/15 03/02/2015  . Mitral regurgitation 03/02/2015  . HX: anticoagulation 03/02/2015  . Chest tightness   . Tobacco abuse   . HLD (hyperlipidemia)   . TIA (transient ischemic attack) 02/20/2015  . 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 Chronic combined systolic and diastolic heart failure, NYHA class 1 ([place]) 02/20/2015  . PAF (paroxysmal atrial fibrillation) ([place]) 01/29/2015  . Accelerated hypertension, hx of 01/03/2015  . Acute diastolic CHF (congestive heart failure), NYHA class 2 ([place]) 01/03/2015  . Demand ischemia ([place]) 01/03/2015  . CAD (coronary artery disease) stent place 03/04/15 01/03/2015  . Troponin level elevated   . Thrombocytopenia ([place])   . Essential hypertension   . S/P abdominal aortic aneurysm repair 07/03/2012    Past Surgical History:  Procedure Laterality Date  . ABDOMINAL AORTIC ANEURYSM REPAIR  02/19/2009   performed by [facility]  . COLONOSCOPY    . KNEE ARTHROSCOPY  2003   left  . LEFT AND RIGHT  HEART CATHETERIZATION WITH CORONARY ANGIOGRAM N/A 03/03/2015   Procedure: LEFT AND RIGHT HEART CATHETERIZATION WITH CORONARY ANGIOGRAM;  Surgeon: Troy Sine, MD;    . PERCUTANEOUS CORONARY STENT INTERVENTION (PCI-S) N/A 03/04/2015   Procedure: PERCUTANEOUS CORONARY STENT INTERVENTION (PCI-S);  Surgeon: Troy Sine, MD; RI 99>>0% w/   BMS, bifurcation CFX-OM 90>>40% w/ Angiosculpt PTCA; dCFX 90>>0% w/   2.7520 mm Rebel BMS  . SHOULDER ARTHROSCOPY WITH  ROTATOR CUFF REPAIR AND SUBACROMIAL DECOMPRESSION Left 11/19/2013   Procedure: LEFT SHOULDER ARTHROSCOPY DEBRIDEMENT EXTENTSIVE DISTAL CLAVICULECTOMY DECOMPRESSION PARTIAL ACROMIOPLASTY WITH CORACOACROMIAL WITH ROTATOR CUFF REPAIR ;  Surgeon: Renette Butters, MD;  Location: [facility];  Service: Orthopedics;  Laterality: Left;       Home Medications    Prior to Admission medications   Medication Sig Start Date End Date Taking? Authorizing Provider  amiodarone (PACERONE) 200 MG tablet Take 1 tablet (200 mg total) by mouth daily. Start by taking 2 tablets (400mg ) by mouth twice daily for 1 week, then reduce to 1 tablet (200mg ) by mouth twice daily for 1 week, then reduce to 1 table (200mg ) by mouth once daily. 03/13/17   Renee Dyane Dustman, PA-C  amLODipine (NORVASC) 10 MG tablet TAKE 1 TABLET BY MOUTH EVERY DAY Patient taking differently: TAKE 1 TABLET BY MOUTH EVERY DAY AT BEDTIME 09/07/16   [name], MD  apixaban (ELIQUIS) 5 MG TABS tablet Take 1 tablet (5 mg total) by mouth 2 (two) times daily. 05/26/15   [name], MD  aspirin EC 81 MG tablet Take 81 mg by mouth daily.    Historical Provider, MD  atorvastatin (LIPITOR) 80 MG tablet Take 1 tablet (80 mg total) by mouth daily. Patient taking differently: Take 80 mg by mouth at bedtime.  05/21/15   [name], MD  diltiazem (CARDIZEM) 30 MG tablet TAKE 1 TABLET EVERY 4 HOURS AS NEEDED FOR IRREGULAR HR>100 AND BP>100 01/02/17   Sherran Needs, NP  famotidine (PEPCID) 40 MG tablet Take 1 tablet (40 mg total) by mouth 2 (two) times daily. 10/19/15   [name], MD  fish oil-omega-3 fatty acids 1000 MG capsule Take 1 g by mouth daily.     Historical Provider, MD  furosemide (LASIX) 20 MG tablet Take 1 tablet (20 mg total) by mouth daily. 03/30/16   [name], MD  irbesartan (AVAPRO) 300 MG tablet Take 1 tablet (300 mg total) by mouth daily. 02/23/17   [name], MD  isosorbide mononitrate (IMDUR) 60 MG 24 hr  tablet TAKE 1 TABLET BY MOUTH EVERY DAY 04/25/16   [name], MD  loratadine (CLARITIN) 10 MG tablet Take 10 mg by mouth daily as needed (seasonal allergies).     Historical Provider, MD  Multiple Vitamin (MULTIVITAMIN WITH MINERALS) TABS tablet Take 1 tablet by mouth daily.    Historical Provider, MD  nitroGLYCERIN (NITROSTAT) 0.4 MG SL tablet Place 1 tablet (0.4 mg total) under the tongue every 5 (five) minutes as needed for chest pain (CP or SOB). Patient taking differently: Place 0.4 mg under the tongue every 5 (five) minutes as needed for chest pain (shortness of breath).  03/09/15   Brett Canales, PA-C  potassium chloride (KLOR-CON M10) 10 MEQ tablet Take 1 tablet (10 mEq total) by mouth 2 (two) times daily. 11/21/16   Sherran Needs, NP    Family History Family History  Problem Relation Age of Onset  . Prostate cancer Father   .  Hypertension Mother   . Diabetes Sister   . Hypertension Sister   . Stroke Neg Hx   . Heart attack Neg Hx     Social History Social History  Substance Use Topics  . Smoking status: Former Smoker    Packs/day: 0.50    Years: 50.00    Types: Cigarettes    Quit date: 03/01/2016  . Smokeless tobacco: Never Used  . Alcohol use 4.2 - 4.8 oz/week    6 Cans of beer, 1 - 2 Shots of liquor per week     Comment: couple shots of royal per day     Allergies   Iohexol and Lisinopril   Review of Systems Review of Systems  Constitutional: Negative for chills and fever.  Respiratory: Positive for chest tightness and shortness of breath.   Cardiovascular: Negative for chest pain, palpitations and leg swelling.  Gastrointestinal: Negative for abdominal pain, nausea and vomiting.  All other systems reviewed and are negative.   Physical Exam Updated Vital Signs BP 140/79   Pulse (!) 51   Temp 97.6 F (36.4 C) (Oral)   Resp 17   Ht 6\' 2"  (1.88 m)   Wt 215 lb (97.5 kg)   SpO2 95%   BMI 27.60 kg/m   Physical Exam  Constitutional: He is oriented  to person, place, and time. He appears well-developed and well-nourished. No distress.  HENT:  Head: Normocephalic and atraumatic.  Cardiovascular: Normal rate, regular rhythm and normal heart sounds.   Pulmonary/Chest: Effort normal and breath sounds normal. No respiratory distress. He has no wheezes.  Abdominal: Soft. Bowel sounds are normal. There is no tenderness. There is no rebound.  Musculoskeletal: He exhibits edema.  Trace bilateral lower extremity edema  Neurological: He is alert and oriented to person, place, and time.  Skin: Skin is warm and dry.  Psychiatric: He has a normal mood and affect.  Nursing note and vitals reviewed.    ED Treatments / Results  DIAGNOSTIC STUDIES: Oxygen Saturation is 99% on RA, normal by my interpretation.   COORDINATION OF CARE: 2:30 AM-Discussed next steps with pt. Pt verbalized understanding and is agreeable with the plan.   Labs (all labs ordered are listed, but only abnormal results are displayed) Labs Reviewed  BASIC METABOLIC PANEL - Abnormal; Notable for the following:       Result Value   Glucose, Bld 146 (*)    Creatinine, Ser 1.44 (*)    GFR calc non Af Amer 46 (*)    GFR calc Af Amer 54 (*)    All other components within normal limits  CBC  I-STAT TROPOININ, ED  I-STAT TROPOININ, ED    EKG  EKG Interpretation  Date/Time:  Monday March 20 2017 18:49:29 EDT Ventricular Rate:  73 PR Interval:  202 QRS Duration: 94 QT Interval:  464 QTC Calculation: 511 R Axis:   -16 Text Interpretation:  Normal sinus rhythm ST & T wave abnormality, consider lateral ischemia Prolonged QT Abnormal ECG T wave inversions present on prior Confirmed by Ivor Kishi  MD, Shekita Boyden ([id number]) on 03/21/2017 2:04:24 AM       Radiology Dg Chest 2 View  Result Date: 03/20/2017 CLINICAL DATA:  Dyspnea and chest tightness. History of atrial fibrillation. EXAM: CHEST  2 VIEW COMPARISON:  03/18/2017 FINDINGS: Heart and mediastinal contours are stable in  appearance with minimal aortic atherosclerosis at the arch. No pneumonic consolidation, effusion or pneumothorax. Left basilar atelectasis and/or scarring is stable. No acute nor suspicious osseous abnormalities.  Chronic degenerative changes are seen along the dorsal spine with osteophytes at multiple levels. Mild physiologic wedging of vertebral bodies at the thoracolumbar junction. IMPRESSION: No active cardiopulmonary disease. Electronically Signed   By: Ashley Royalty M.D.   On: 03/20/2017 20:06    Procedures Procedures (including critical care time)  Medications Ordered in ED Medications - No data to display   Initial Impression / Assessment and Plan / ED Course  I have reviewed the triage vital signs and the nursing notes.  Pertinent labs & imaging results that were available during my care of the patient were reviewed by me and considered in my medical decision making (see chart for details).     Patient presents with hypertension and shortness of breath with chest pressure. Currently symptom free. Takes his blood pressure routinely daily and noted elevated blood pressures. Is concerned his medications may not be helping. He has been seen 2 times in last week for atrial fibrillation. He is not currently in atrial fibrillation. He is in sinus rhythm. Initial workup is reassuring including troponin. Chest x-ray shows no evidence of pulmonary edema. Repeat blood pressures here have been 229-798 systolic. He has remained symptom free. Repeat troponin is negative. I have reassured the patient. Unclear whether his blood pressure cuff at home has appropriate calibration. He will need to have this checked. He needs to also closely follow with cardiology regarding his symptoms. Patient and his wife stated understanding.  After history, exam, and medical workup I feel the patient has been appropriately medically screened and is safe for discharge home. Pertinent diagnoses were discussed with the  patient. Patient was given return precautions.   Final Clinical Impressions(s) / ED Diagnoses   Final diagnoses:  Essential hypertension  Atypical chest pain    New Prescriptions Discharge Medication List as of 03/21/2017  4:18 AM     I personally performed the services described in this documentation, which was scribed in my presence. The recorded information has been reviewed and is accurate.     Merryl Hacker, MD 03/21/17 [phone]

## 2017-03-21 NOTE — Discharge Instructions (Signed)
You were seen today for high blood pressure. Your workup is reassuring. Follow-up with your cardiologist for reevaluation of her blood pressure medications.

## 2017-03-21 NOTE — Telephone Encounter (Signed)
Offered soonest available ablation date of 5/4 but pt cannot do b/c he will be out of town.  Informed I would place him on wait list incase sooner case became available. Instructed that I would call him by the end of the week to review instructions for next months procedure. Patient verbalized understanding and agreeable to plan.

## 2017-03-26 ENCOUNTER — Other Ambulatory Visit: Payer: Self-pay | Admitting: Internal Medicine

## 2017-03-27 ENCOUNTER — Encounter: Payer: Self-pay | Admitting: *Deleted

## 2017-03-27 NOTE — Telephone Encounter (Signed)
AFib ablation scheduled for 5/18.  H&P and pre procedure labs on 5/15. Pt aware office will call him to arrange cardiac CT w/i 7 days prior to the procedure.  Letter of instructions reviewed with the patient and mailed to home address. Post procedure follow up appts to be made when pt comes in on 5/15. Patient verbalized understanding and agreeable to plan.

## 2017-04-03 ENCOUNTER — Telehealth: Payer: Self-pay | Admitting: Cardiology

## 2017-04-03 NOTE — Telephone Encounter (Signed)
New message     Pt c/o medication issue:  1. Name of Medication: amiodarone 2. How are you currently taking this medication (dosage and times per day)? 200mg  1 tab daily 3. Are you having a reaction (difficulty breathing--STAT)? Pt thinks he is having a reaction to the medication--rash a few weeks ago, chest discomfort almost daily since starting medication lasting for a few seconds 4. What is your medication issue?  Pt thinks he is having a reaction to the medication. However, he is not having any symptom at the time of the call

## 2017-04-03 NOTE — Telephone Encounter (Signed)
Patient walked in to clinic today after no call back from the nurse- RN in clinic at the time.  He has complaints of a rash that he noticed to his arms and itching to his ears and face- this started last week, but has essentially resolved per the patient.  He was started on a loading dose of amiodarone on 03/13/17, but is currently on 200 mg once daily. He complains of intermittent nausea. He is also complaining of a "flash" pain that crosses his breast bone from right to left.  He states this started late Saturday, occurred twice yesterday, and has been occurring about 3-4 x/ hour today.  Symptoms are not related to activity. He also complains of SOB that he has about 2 hours after he takes his medications. He does have a history of coronary stents x 2, but states symptoms of pain are not similar to prior stenting.   His initial concern was his symptoms being related to his amiodarone.  I advised chest pain is not a side effect.  Reviewed with Derrill Memo- she called Dr. [name] and spoke with him via phone. Symptoms reviewed.  No acute concern for a coronary event.  The patient may stop amiodarone per Dr. [name] with the understanding he may revert back to a-fib with RVR.   I have reviewed this with the patient. We have discussed that his fleeting pain may be related to brief episodes of a-fib or PVC's (he has a history of this).  I have advised him that his symptoms do not appear to be overly concerning at this time, however, if chest pain worsens he should report to the ER.  I have advised him to avoid stimulants such as caffeine. We have discussed how stress/ anxiety can be a trigger for symptoms- he is pending a-fib ablation on 04/28/17.  I have also offered to him to that can make an appointment with the PA/ NP if he desires, but I am unsure at this time if his course of treatment will change. He will continue to monitor symptoms for now.

## 2017-04-05 ENCOUNTER — Other Ambulatory Visit: Payer: Self-pay | Admitting: Internal Medicine

## 2017-04-20 ENCOUNTER — Encounter: Payer: Self-pay | Admitting: Cardiology

## 2017-04-20 ENCOUNTER — Telehealth: Payer: Self-pay | Admitting: *Deleted

## 2017-04-20 MED ORDER — PREDNISONE 50 MG PO TABS: ORAL_TABLET | ORAL | 0 refills | Status: DC

## 2017-04-20 NOTE — Telephone Encounter (Signed)
Dye allergy pre medication instructions given to pt. Rx sent to [facility] Patient verbalized understanding and agreeable to plan.

## 2017-04-20 NOTE — Telephone Encounter (Signed)
lmtcb for pt to give instructions for pre medications prior to CT next week.  (13hr, 7hr, & 1 hr prior -- take Prednisone 50 mg) (1hr prior -- take Benadryl 50 mg)

## 2017-04-24 ENCOUNTER — Ambulatory Visit (INDEPENDENT_AMBULATORY_CARE_PROVIDER_SITE_OTHER): Payer: Medicare Other | Admitting: Internal Medicine

## 2017-04-24 ENCOUNTER — Encounter: Payer: Self-pay | Admitting: Internal Medicine

## 2017-04-24 ENCOUNTER — Encounter (INDEPENDENT_AMBULATORY_CARE_PROVIDER_SITE_OTHER): Payer: Self-pay

## 2017-04-24 VITALS — BP 144/73 | HR 60 | Ht 74.0 in | Wt 221.4 lb

## 2017-04-24 DIAGNOSIS — I10 Essential (primary) hypertension: Secondary | ICD-10-CM | POA: Diagnosis not present

## 2017-04-24 DIAGNOSIS — I480 Paroxysmal atrial fibrillation: Secondary | ICD-10-CM | POA: Diagnosis not present

## 2017-04-24 DIAGNOSIS — I255 Ischemic cardiomyopathy: Secondary | ICD-10-CM | POA: Diagnosis not present

## 2017-04-24 NOTE — Progress Notes (Signed)
OFFICE NOTE  Chief Complaint:  hospital follow-up, no recurrent a-fib  Primary Care Physician: [name], MD  HPI:  Rodney [name] is a 76 y.o. male with a past medical history significant for CAD, hypertension, dyslipidemia, and AAA s/p stent-grafting in 2012 by Dr. Kellie Simmering. Preoperatively, he underwent a stress test which was abnormal, showing an inferior defect consistent with possible scar and mild to moderate LV dysfunction. He had a LHC, which revealed the following:  ANGIOGRAPHY: Left main: The left main is fairly normal.  The left anterior descending artery has some proximal luminal irregularities between 20 and 30%. The mid vessel stenosis around 30- 40%. It gives off several small diagonal arteries. The first diagonal artery is moderate in size and has mild irregularities. The second diagonal artery is very tiny and has an 80% proximal stenosis. This vessel is quite small and is not a candidate for PTCA.  The remaining LAD has only minor luminal irregularities.  The left circumflex artery is a large vessel. It gives off a very high obtuse marginal artery. The remaining circumflex artery continues around the A-V groove and supplies a posterolateral branch.  The first obtuse marginal artery is subtotally occluded at 2 different sites. There was sluggish TIMI grade 1 flow through this vessel. This vessel reaches around the lateral wall and supplies a small amount of the inferior left ventricle. This was quite likely where the site of the abnormal Cardiolite.  The right coronary artery is large and dominant. There are minor luminal irregularities. The posterior descending artery and the posterolateral segment artery, they taper fairly quickly at the takeoff. This taper improved slightly with intracoronary nitroglycerin. There are only minor luminal irregularities between 20 and 30%.  The left ventriculogram was performed  in a 30-RAO position. It reveals a moderately dilated and hypocontractile left ventricle. The ejection fraction is probably 40%.  During this left ventriculogram, we noticed that he had a very dilated aortic root. We performed an aortic root angiography, which revealed a dilated aortic root. There was no evidence of aortic insufficiency.  He now presents with accelerated hypertension and dyspnea, but no chest pain -troponin is borderline elevated at 0.05 and 0.07, BNP is elevated at 331. CXR demonstrates pulmonary edema. EKG shows sinus rhythm with LVH and lateral repolarization changes. Cardiology is asked to consult regarding management.  Rodney [name] was recently hospitalized and treated for heart failure. He started on diuretic and we discontinued his nifedipine and switched him to carvedilol. He recently followed up with his primary care provider who noted he was in new onset atrial fibrillation. It's feasible that atrial fibrillation was the precipitating factor for his heart failure. He seems to be unaware of his atrial fibrillation. A repeat EKG in our office today shows that he is converted back to sinus rhythm but he does have deep anterolateral and inferior T-wave inversions. These could be due to repolarization secondary to LVH however given his history of known coronary artery disease, could represent ischemia.  I saw Rodney [name] back in the office today. He underwent a nuclear stress test which was interpreted as follows:  Overall Impression: High risk stress nuclear study due to  severely decreased LV function (EF 28% - down from 445). There is a moderate inferior wall scar. No reversible ischemia is seen.. Consider multivessel CAD  versus adverse post-infarction remodeling as cause of worsening  Cardiomyopathy.  Rodney [name] returns today for follow-up. Recently he underwent cardiac catheterization and underwent stenting to the  ramus and circumflex as follows:  Difficult but  successful complex intervention involving PTCA/BM stenting of a subtotal ramus intermediate vessel with the entire proximal region being reduced to 0% in an intermediate vessel which extended to the LV apex once opened; and Angiosculpt/PTCA of bifurcation stenosis involving the distal circumflex flex marginal branch and Angiosculpt/BM stenting of a dominant distal circumflex extending into the PD/PLA vessel.  Subsequently he was noted to have increasing problems with palpitations and atrial fibrillation with rapid ventricular response. We elected to start him on dofetilide which he did well with however had some QT prolongation on the 500 g dose. Subsequently was placed on 250 g twice a day and is tolerating that well. Overall he feels well and has not had recurrence since discharge. He did feel subjective palpitations recently and went to the emergency room. He also was notably hypertensive. His rhythm was sinus. Blood pressure still is persistently elevated despite being on max dose irbesartan, carvedilol, Imdur and Lasix. He denies any chest pain. He successfully stop smoking and currently using nicotine patches but is having cravings.  Rodney [name] returns today for follow-up. Overall he seems to be doing fairly well. He exercises regularly and has gotten his weight down to his lowest wait which he has not seen a number of years. He reports very infrequent episodes of A. fib, maybe 2 or 3 over the past year which represents good control on Tikosyn. 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 This has not been assessed in the last year. He occasionally gets some bleeding of his gums which of encouraged him to follow-up with his dentist. He needs to stay on aspirin on a daily basis because of the coronary benefit. Recently he saw Roderic Palau, NP for ongoing follow-up of Tikosyn. His potassium was 3.9 and she encouraged him to take potassium supplements to try to get it above 4.0. Were requested  a recheck his potassium today.  04/24/2017  Rodney [name] returns today for hospital follow-up. He was hospitalized in April with recurrent A. fib. He was weaned off Tikosyn and started on amiodarone. He was seen by Dr. [name] for a-fib ablation. He reports having had a rash on 400 mg amiodarone but that resolved with decreasing the dose to 200 mg daily. He scheduled to see Dr. [name] tomorrow and have CT angiography with a planned A. fib ablation on Friday.  PMHx:  Past Medical History:  Diagnosis Date  . AAA (abdominal aortic aneurysm) ([place])    stent graft put in 02/2009  . Arthritis   . CAD (coronary artery disease)    a. cath 2010 b. CAD s/p PCI to ramus and LCX on 03/04/15  . Gout   . Hyperlipidemia   . Hypertension   . OSA (obstructive sleep apnea) 09/30/2015   Very mild with AHI 6.2/hr  . Paroxysmal atrial fibrillation ([facility])   . Tobacco abuse     Past Surgical History:  Procedure Laterality Date  . ABDOMINAL AORTIC ANEURYSM REPAIR  02/19/2009   performed by [facility]  . COLONOSCOPY    . KNEE ARTHROSCOPY  2003   left  . LEFT AND RIGHT HEART CATHETERIZATION WITH CORONARY ANGIOGRAM N/A 03/03/2015   Procedure: LEFT AND RIGHT HEART CATHETERIZATION WITH CORONARY ANGIOGRAM;  Surgeon: Troy Sine, MD;    . PERCUTANEOUS CORONARY STENT INTERVENTION (PCI-S) N/A 03/04/2015   Procedure: PERCUTANEOUS CORONARY STENT INTERVENTION (PCI-S);  Surgeon: Troy Sine, MD; RI 99>>0% w/   BMS, bifurcation CFX-OM 90>>40%  w/ Angiosculpt PTCA; dCFX 90>>0% w/   2.7520 mm Rebel BMS  . SHOULDER ARTHROSCOPY WITH ROTATOR CUFF REPAIR AND SUBACROMIAL DECOMPRESSION Left 11/19/2013   Procedure: LEFT SHOULDER ARTHROSCOPY DEBRIDEMENT EXTENTSIVE DISTAL CLAVICULECTOMY DECOMPRESSION PARTIAL ACROMIOPLASTY WITH CORACOACROMIAL WITH ROTATOR CUFF REPAIR ;  Surgeon: Renette Butters, MD;  Location: [place];  Service: Orthopedics;  Laterality: Left;    FAMHx:  Family History  Problem Relation Age of Onset  .  Prostate cancer Father   . Hypertension Mother   . Diabetes Sister   . Hypertension Sister   . Stroke Neg Hx   . Heart attack Neg Hx     SOCHx:   reports that he quit smoking about 13 months ago. His smoking use included Cigarettes. He has a 25.00 pack-year smoking history. He has never used smokeless tobacco. He reports that he drinks about 4.2 - 4.8 oz of alcohol per week . He reports that he does not use drugs.  ALLERGIES:  Allergies  Allergen Reactions  . Iohexol Other (See Comments)     Code: RASH, Desc: PATIENT STATED THAT HE BEGAN ITCHING TOWARDS END OF INJECTION OF IV CONTRAST-- 13 HR PREP RECOMMENDED/MMS   . Lisinopril Cough    ROS: Pertinent items noted in HPI and remainder of comprehensive ROS otherwise negative. No updates.  HOME MEDS: Current Outpatient Prescriptions  Medication Sig Dispense Refill  . amiodarone (PACERONE) 200 MG tablet Take 1 tablet (200 mg total) by mouth daily. Start by taking 2 tablets (400mg ) by mouth twice daily for 1 week, then reduce to 1 tablet (200mg ) by mouth twice daily for 1 week, then reduce to 1 table (200mg ) by mouth once daily. 58 tablet 3  . amLODipine (NORVASC) 10 MG tablet TAKE 1 TABLET BY MOUTH EVERY DAY 30 tablet 0  . apixaban (ELIQUIS) 5 MG TABS tablet Take 1 tablet (5 mg total) by mouth 2 (two) times daily. 180 tablet 3  . aspirin EC 81 MG tablet Take 81 mg by mouth daily.    Marland Kitchen atorvastatin (LIPITOR) 80 MG tablet Take 1 tablet (80 mg total) by mouth daily. (Patient taking differently: Take 80 mg by mouth at bedtime. ) 90 tablet 3  . diltiazem (CARDIZEM) 30 MG tablet TAKE 1 TABLET EVERY 4 HOURS AS NEEDED FOR IRREGULAR HR>100 AND BP>100 45 tablet 2  . famotidine (PEPCID) 40 MG tablet Take 1 tablet (40 mg total) by mouth 2 (two) times daily. 180 tablet 1  . fish oil-omega-3 fatty acids 1000 MG capsule Take 1 g by mouth daily.     . furosemide (LASIX) 20 MG tablet Take 1 tablet (20 mg total) by mouth daily. 30 tablet 11  .  irbesartan (AVAPRO) 300 MG tablet Take 1 tablet (300 mg total) by mouth daily. 30 tablet 2  . isosorbide mononitrate (IMDUR) 60 MG 24 hr tablet TAKE 1 TABLET BY MOUTH EVERY DAY 30 tablet 1  . loratadine (CLARITIN) 10 MG tablet Take 10 mg by mouth daily as needed (seasonal allergies).     . Multiple Vitamin (MULTIVITAMIN WITH MINERALS) TABS tablet Take 1 tablet by mouth daily.    . nitroGLYCERIN (NITROSTAT) 0.4 MG SL tablet Place 1 tablet (0.4 mg total) under the tongue every 5 (five) minutes as needed for chest pain (CP or SOB). (Patient taking differently: Place 0.4 mg under the tongue every 5 (five) minutes as needed for chest pain (shortness of breath). ) 25 tablet 12  . potassium chloride (KLOR-CON M10) 10 MEQ tablet  Take 1 tablet (10 mEq total) by mouth 2 (two) times daily. 180 tablet 3  . predniSONE (DELTASONE) 50 MG tablet Take 1 tablet (50 mg total) 13 hr, and 7 hr, and 1 hr before your CT test. 3 tablet 0   No current facility-administered medications for this visit.     LABS/IMAGING: No results found for this or any previous visit (from the past 48 hour(s)). No results found.  VITALS: BP (!) 144/73   Pulse 60   Ht 6\' 2"  (1.88 m)   Wt 221 lb 6.4 oz (100.4 kg)   BMI 28.43 kg/m   EXAM: General appearance: alert and no distress Lungs: clear to auscultation bilaterally Heart: regular rate and rhythm, S1, S2 normal, no murmur, click, rub or gallop Extremities: extremities normal, atraumatic, no cyanosis or edema Skin: Skin color, texture, turgor normal. No rashes or lesions Neurologic: Grossly normal  EKG: Normal sinus rhythm at 60, anterolateral T-wave inversions  ASSESSMENT: 1. Recent PCI to the ramus intermedius and distal circumflex arteries (02/2015) 2. Ischemic cardiomyopathy EF 35-40%  - improved to 45-50% on 04/19/2016 3. History of systolic and diastolic congestive heart failure 4. Accelerated hypertension 5. History of abdominal aortic aneurysm repair 6. Paroxysmal  atrial fibrillation - CHADSVASC 4, was on dofetilide - now amiodarone for breakthrough  PLAN: 1.   Mr. Gater has had recurrent atrial fibrillation on amiodarone. Without recurrence. He plans to have A. fib ablation with Dr. [name] on 5/18. He is maintaining sinus rhythm today. He did have a refill rash which resolved after starting on amiodarone. Follow-up with Dr. [name] after the procedure and with me in 6 months.  [name], MD, [facility] Attending Cardiologist Waikele C Latria Mccarron 04/24/2017, 9:29 AM

## 2017-04-24 NOTE — Patient Instructions (Signed)
Your physician wants you to follow-up in: 6 months with Dr. Hilty. You will receive a reminder letter in the mail two months in advance. If you don't receive a letter, please call our office to schedule the follow-up appointment.    

## 2017-04-25 ENCOUNTER — Encounter: Payer: Self-pay | Admitting: Cardiology

## 2017-04-25 ENCOUNTER — Encounter (INDEPENDENT_AMBULATORY_CARE_PROVIDER_SITE_OTHER): Payer: Self-pay

## 2017-04-25 ENCOUNTER — Ambulatory Visit (INDEPENDENT_AMBULATORY_CARE_PROVIDER_SITE_OTHER): Payer: Medicare Other | Admitting: Cardiology

## 2017-04-25 VITALS — BP 118/72 | HR 61 | Ht 74.0 in | Wt 223.4 lb

## 2017-04-25 DIAGNOSIS — Z01812 Encounter for preprocedural laboratory examination: Secondary | ICD-10-CM | POA: Diagnosis not present

## 2017-04-25 DIAGNOSIS — I480 Paroxysmal atrial fibrillation: Secondary | ICD-10-CM

## 2017-04-25 NOTE — Addendum Note (Signed)
Addended by: Eulis Foster on: 04/25/2017 01:10 PM   Modules accepted: Orders

## 2017-04-25 NOTE — Patient Instructions (Addendum)
Medication Instructions:    Your physician recommends that you continue on your current medications as directed. Please refer to the Current Medication list given to you today.  Labwork:  Pre procedure labs today: BMET & CBC w/ diff  Testing/Procedures:  None ordered  Follow-Up:  Your physician recommends that you schedule a follow-up appointment in: 4 weeks, after your procedure on 04/28/17, with Roderic Palau in the AFib clinic.   Your physician recommends that you schedule a follow-up appointment in: 3 months, after your procedure on 04/28/17, with Dr. [name].   - If you need a refill on your cardiac medications before your next appointment, please call your pharmacy.   Thank you for choosing [facility]!!   Trinidad Curet, RN [phone]   Any Other Special Instructions Will Be Listed Below (If Applicable).   Cardiac Ablation Cardiac ablation is a procedure to stop some heart tissue from causing problems. The heart has many electrical connections. Sometimes these connections make the heart beat very fast or irregularly. Removing some problem areas can improve the heart rhythm or make it normal. What happens before the procedure?  Follow instructions from your doctor about what you cannot eat or drink.  Ask your doctor about:  Changing or stopping your normal medicines. This is important if you take diabetes medicines or blood thinners.  Taking medicines such as aspirin and ibuprofen. These medicines can thin your blood. Do not take these medicines before your procedure if your doctor tells you not to.  Plan to have someone take you home.  If you will be going home right after the procedure, plan to have someone with you for 24 hours. What happens during the procedure?  To lower your risk of infection:  Your health care team will wash or sanitize their hands.  Your skin will be washed with soap.  Hair may be removed from your neck or groin.  An IV tube  will be put into one of your veins.  You will be given a medicine to help you relax (sedative).  Skin on your neck or groin will be numbed.  A cut (incision) will be made in your neck or groin.  A needle will be put through your cut and into a vein in your neck or groin.  A tube (catheter) will be put into the needle. The tube will be moved to your heart. X-rays (fluoroscopy) will be used to help guide the tube.  Small devices (electrodes) on the tip of the tube will send out electrical currents.  Dye may be put through the tube. This helps your surgeon see your heart.  Electrical energy will be used to scar (ablate) some heart tissue. Your surgeon may use:  Heat (radiofrequency energy).  Laser energy.  Extreme cold (cryoablation).  The tube will be taken out.  Pressure will be held on your cut. This helps stop bleeding.  A bandage (dressing) will be put on your cut. The procedure may vary. What happens after the procedure?  You will be monitored until your medicines have worn off.  Your cut will be watched for bleeding. You will need to lie still for a few hours.  Do not drive for 24 hours or as long as your doctor tells you. Summary  Cardiac ablation is a procedure to stop some heart tissue from causing problems.  Electrical energy will be used to scar (ablate) some heart tissue. This information is not intended to replace advice given to you by your health  care provider. Make sure you discuss any questions you have with your health care provider. Document Released: 07/31/2013 Document Revised: 10/17/2016 Document Reviewed: 10/17/2016 Elsevier Interactive Patient Education  2017 [employer].

## 2017-04-25 NOTE — Progress Notes (Signed)
Electrophysiology Office Note   Date:  04/25/2017   ID:  Rodney [name], DOB 1941/11/26, MRN [id number]  PCP:  [name], MD  Cardiologist:  Debara Pickett Primary Electrophysiologist:  Bernie Ransford Meredith Leeds, MD    Chief Complaint  Patient presents with  . Follow-up    PAF/H&P and Pre labs     History of Present Illness: Rodney [name] is a 76 y.o. male who is being seen today for the evaluation of atrial fibrillation at the request of [name], MD. Presenting today for electrophysiology evaluation. He has a history of AAA, coronary artery disease status post stent to the ramus and circumflex, hypertension, hyperlipidemia, OSA, and paroxysmal atrial fibrillation. He was hospitalized for atrial fibrillation at the beginning of April. He had previously been on dofetilide, which was stopped. Plan for atrial fibrillation ablation on 04/28/17.    Today, he denies symptoms of palpitations, chest pain, shortness of breath, orthopnea, PND, lower extremity edema, claudication, dizziness, presyncope, syncope, bleeding, or neurologic sequela. The patient is tolerating medications without difficulties.    Past Medical History:  Diagnosis Date  . AAA (abdominal aortic aneurysm) ([place])    stent graft put in 02/2009  . Arthritis   . CAD (coronary artery disease)    a. cath 2010 b. CAD s/p PCI to ramus and LCX on 03/04/15  . Gout   . Hyperlipidemia   . Hypertension   . OSA (obstructive sleep apnea) 09/30/2015   Very mild with AHI 6.2/hr  . Paroxysmal atrial fibrillation ([facility])   . Tobacco abuse    Past Surgical History:  Procedure Laterality Date  . ABDOMINAL AORTIC ANEURYSM REPAIR  02/19/2009   performed by [facility]  . COLONOSCOPY    . KNEE ARTHROSCOPY  2003   left  . LEFT AND RIGHT HEART CATHETERIZATION WITH CORONARY ANGIOGRAM N/A 03/03/2015   Procedure: LEFT AND RIGHT HEART CATHETERIZATION WITH CORONARY ANGIOGRAM;  Surgeon: Troy Sine, MD;    . PERCUTANEOUS CORONARY STENT INTERVENTION  (PCI-S) N/A 03/04/2015   Procedure: PERCUTANEOUS CORONARY STENT INTERVENTION (PCI-S);  Surgeon: Troy Sine, MD; RI 99>>0% w/   BMS, bifurcation CFX-OM 90>>40% w/ Angiosculpt PTCA; dCFX 90>>0% w/   2.7520 mm Rebel BMS  . SHOULDER ARTHROSCOPY WITH ROTATOR CUFF REPAIR AND SUBACROMIAL DECOMPRESSION Left 11/19/2013   Procedure: LEFT SHOULDER ARTHROSCOPY DEBRIDEMENT EXTENTSIVE DISTAL CLAVICULECTOMY DECOMPRESSION PARTIAL ACROMIOPLASTY WITH CORACOACROMIAL WITH ROTATOR CUFF REPAIR ;  Surgeon: Renette Butters, MD;  Location: [place];  Service: Orthopedics;  Laterality: Left;     Current Outpatient Prescriptions  Medication Sig Dispense Refill  . amiodarone (PACERONE) 200 MG tablet Take 1 tablet (200 mg total) by mouth daily. Start by taking 2 tablets (400mg ) by mouth twice daily for 1 week, then reduce to 1 tablet (200mg ) by mouth twice daily for 1 week, then reduce to 1 table (200mg ) by mouth once daily. 58 tablet 3  . amLODipine (NORVASC) 10 MG tablet TAKE 1 TABLET BY MOUTH EVERY DAY 30 tablet 0  . apixaban (ELIQUIS) 5 MG TABS tablet Take 1 tablet (5 mg total) by mouth 2 (two) times daily. 180 tablet 3  . aspirin EC 81 MG tablet Take 81 mg by mouth daily.    Marland Kitchen atorvastatin (LIPITOR) 80 MG tablet Take 1 tablet (80 mg total) by mouth daily. (Patient taking differently: Take 80 mg by mouth at bedtime. ) 90 tablet 3  . diltiazem (CARDIZEM) 30 MG tablet TAKE 1 TABLET EVERY 4 HOURS AS NEEDED FOR IRREGULAR  HR>100 AND BP>100 45 tablet 2  . famotidine (PEPCID) 40 MG tablet Take 1 tablet (40 mg total) by mouth 2 (two) times daily. 180 tablet 1  . fish oil-omega-3 fatty acids 1000 MG capsule Take 1,000 mg by mouth daily.     . furosemide (LASIX) 20 MG tablet Take 1 tablet (20 mg total) by mouth daily. 30 tablet 11  . irbesartan (AVAPRO) 300 MG tablet Take 1 tablet (300 mg total) by mouth daily. 30 tablet 2  . isosorbide mononitrate (IMDUR) 60 MG 24 hr tablet TAKE 1 TABLET BY MOUTH EVERY DAY 30  tablet 1  . loratadine (CLARITIN) 10 MG tablet Take 10 mg by mouth daily as needed (seasonal allergies).     . Multiple Vitamin (MULTIVITAMIN WITH MINERALS) TABS tablet Take 1 tablet by mouth daily.    . nicotine polacrilex (COMMIT) 2 MG lozenge Take 2 mg by mouth 3 (three) times daily as needed for smoking cessation.    . nitroGLYCERIN (NITROSTAT) 0.4 MG SL tablet Place 1 tablet (0.4 mg total) under the tongue every 5 (five) minutes as needed for chest pain (CP or SOB). (Patient taking differently: Place 0.4 mg under the tongue every 5 (five) minutes as needed for chest pain (shortness of breath). ) 25 tablet 12  . potassium chloride (KLOR-CON M10) 10 MEQ tablet Take 1 tablet (10 mEq total) by mouth 2 (two) times daily. 180 tablet 3  . predniSONE (DELTASONE) 50 MG tablet Take 1 tablet (50 mg total) 13 hr, and 7 hr, and 1 hr before your CT test. 3 tablet 0   No current facility-administered medications for this visit.     Allergies:   Lisinopril and Iohexol   Social History:  The patient  reports that he quit smoking about 13 months ago. His smoking use included Cigarettes. He has a 25.00 pack-year smoking history. He has never used smokeless tobacco. He reports that he drinks about 4.2 - 4.8 oz of alcohol per week . He reports that he does not use drugs.   Family History:  The patient's family history includes Diabetes in his sister; Hypertension in his mother and sister; Prostate cancer in his father.    ROS:  Please see the history of present illness.   Otherwise, review of systems is positive for Appetite change, orthopnea, palpitations, cough, wheezing, rash.   All other systems are reviewed and negative.    PHYSICAL EXAM: VS:  BP 118/72   Pulse 61   Ht 6\' 2"  (1.88 m)   Wt 223 lb 6.4 oz (101.3 kg)   SpO2 96%   BMI 28.68 kg/m  , BMI Body mass index is 28.68 kg/m. GEN: Well nourished, well developed, in no acute distress  HEENT: normal  Neck: no JVD, carotid bruits, or  masses Cardiac: RRR; no murmurs, rubs, or gallops,no edema  Respiratory:  clear to auscultation bilaterally, normal work of breathing GI: soft, nontender, nondistended, + BS MS: no deformity or atrophy  Skin: warm and dry Neuro:  Strength and sensation are intact Psych: euthymic mood, full affect  EKG:  EKG is not ordered today. Personal review of the ekg ordered 5/14/18shows sinus rhythm, anterior T wave changes  Recent Labs: 03/11/2017: B Natriuretic Peptide 61.5; TSH 1.456 03/12/2017: Magnesium 2.1 03/20/2017: BUN 12; Creatinine, Ser 1.44; Hemoglobin 14.7; Platelets 152; Potassium 3.8; Sodium 139    Lipid Panel     Component Value Date/Time   CHOL 125 02/21/2015 0555   TRIG 173 (H) 02/21/2015 2841  HDL 43 02/21/2015 0555   CHOLHDL 2.9 02/21/2015 0555   VLDL 35 02/21/2015 0555   LDLCALC 47 02/21/2015 0555     Wt Readings from Last 3 Encounters:  04/25/17 223 lb 6.4 oz (101.3 kg)  04/24/17 221 lb 6.4 oz (100.4 kg)  03/20/17 215 lb (97.5 kg)      Other studies Reviewed: Additional studies/ records that were reviewed today include: TTE 04/19/16  Review of the above records today demonstrates:  - Left ventricle: The cavity size was mildly dilated. Wall   thickness was increased in a pattern of mild LVH. Systolic   function was mildly reduced. The estimated ejection fraction was   in the range of 45% to 50%. Diffuse hypokinesis. Doppler   parameters are consistent with abnormal left ventricular   relaxation (grade 1 diastolic dysfunction). - Aortic root: The aortic root was mildly dilated. - Left atrium: The atrium was mildly dilated. - Pulmonary arteries: Systolic pressure was mildly to moderately   increased. PA peak pressure: 44 mm Hg (S).   ASSESSMENT AND PLAN:  1.  Paroxysmal atrial fibrillation: Was taken off of Tikosyn and started on amiodarone and his last hospitalization. Plan for ablation 04/28/17. Risks and benefits of ablation were discussed. Risks include  bleeding, tamponade, heart block, stroke, and damage to surrounding organs. He understands these risks. Currently he is on Eliquis.  This patients CHA2DS2-VASc Score and unadjusted Ischemic Stroke Rate (% per year) is equal to 4.8 % stroke rate/year from a score of 4  Above score calculated as 1 point each if present [CHF, HTN, DM, Vascular=MI/PAD/Aortic Plaque, Age if 65-74, or Male] Above score calculated as 2 points each if present [Age > 75, or Stroke/TIA/TE]  2. Coronary artery disease: Status post PCI to the ramus and distal circumflex. Early chest pain-free. No changes necessary today.  3. Essential hypertension: Blood pressure well controlled. No changes necessary.  4. Ischemic cardiomyopathy: EF improved to 45-50% on 04/19/16. Continue current management.     Current medicines are reviewed at length with the patient today.   The patient does not have concerns regarding his medicines.  The following changes were made today:  none  Labs/ tests ordered today include:  No orders of the defined types were placed in this encounter.    Disposition:   FU with Vy Badley 3 months  Signed, Lillyauna Jenkinson Meredith Leeds, MD  04/25/2017 12:28 PM     [facility] [address] [place] [address] [phone] (office) [phone] (fax)

## 2017-04-26 ENCOUNTER — Ambulatory Visit (HOSPITAL_COMMUNITY)
Admission: RE | Admit: 2017-04-26 | Discharge: 2017-04-26 | Disposition: A | Discharge status: Home / Self Care | Payer: Medicare Other | Source: Ambulatory Visit | Attending: Cardiology | Admitting: Cardiology

## 2017-04-26 ENCOUNTER — Encounter (HOSPITAL_COMMUNITY): Payer: Self-pay

## 2017-04-26 DIAGNOSIS — I480 Paroxysmal atrial fibrillation: Secondary | ICD-10-CM | POA: Insufficient documentation

## 2017-04-26 DIAGNOSIS — J439 Emphysema, unspecified: Secondary | ICD-10-CM | POA: Diagnosis not present

## 2017-04-26 DIAGNOSIS — I700 Atherosclerosis of aorta: Secondary | ICD-10-CM | POA: Diagnosis not present

## 2017-04-26 DIAGNOSIS — I4891 Unspecified atrial fibrillation: Secondary | ICD-10-CM | POA: Diagnosis not present

## 2017-04-26 LAB — CBC WITH DIFFERENTIAL/PLATELET
BASOS: 0 %
Basophils Absolute: 0 10*3/uL (ref 0.0–0.2)
EOS (ABSOLUTE): 0.1 10*3/uL (ref 0.0–0.4)
EOS: 2 %
HEMATOCRIT: 41.3 % (ref 37.5–51.0)
Hemoglobin: 13.7 g/dL (ref 13.0–17.7)
IMMATURE GRANULOCYTES: 0 %
Immature Grans (Abs): 0 10*3/uL (ref 0.0–0.1)
Lymphocytes Absolute: 1.6 10*3/uL (ref 0.7–3.1)
Lymphs: 40 %
MCH: 29.7 pg (ref 26.6–33.0)
MCHC: 33.2 g/dL (ref 31.5–35.7)
MCV: 90 fL (ref 79–97)
MONOS ABS: 0.5 10*3/uL (ref 0.1–0.9)
Monocytes: 12 %
NEUTROS PCT: 46 %
Neutrophils Absolute: 1.8 10*3/uL (ref 1.4–7.0)
Platelets: 163 10*3/uL (ref 150–379)
RBC: 4.61 x10E6/uL (ref 4.14–5.80)
RDW: 14.5 % (ref 12.3–15.4)
WBC: 3.9 10*3/uL (ref 3.4–10.8)

## 2017-04-26 LAB — BASIC METABOLIC PANEL
BUN/Creatinine Ratio: 10 (ref 10–24)
BUN: 12 mg/dL (ref 8–27)
CO2: 20 mmol/L (ref 18–29)
Calcium: 9.2 mg/dL (ref 8.6–10.2)
Chloride: 105 mmol/L (ref 96–106)
Creatinine, Ser: 1.2 mg/dL (ref 0.76–1.27)
GFR calc Af Amer: 68 mL/min/{1.73_m2} (ref >59)
GFR calc non Af Amer: 59 mL/min/{1.73_m2} — ABNORMAL LOW (ref >59)
GLUCOSE: 96 mg/dL (ref 65–99)
Potassium: 4.6 mmol/L (ref 3.5–5.2)
SODIUM: 142 mmol/L (ref 134–144)

## 2017-04-26 MED ORDER — IOPAMIDOL (ISOVUE-370) INJECTION 76%
INTRAVENOUS | Status: AC
  Administered 2017-04-26: 80 mL
  Filled 2017-04-26: qty 100

## 2017-04-28 ENCOUNTER — Ambulatory Visit (HOSPITAL_COMMUNITY): Payer: Medicare Other | Admitting: Certified Registered Nurse Anesthetist

## 2017-04-28 ENCOUNTER — Ambulatory Visit (HOSPITAL_COMMUNITY)
Admission: RE | Admit: 2017-04-28 | Discharge: 2017-04-29 | Disposition: A | Discharge status: Home / Self Care | Payer: Medicare Other | Source: Ambulatory Visit | Attending: Cardiology | Admitting: Cardiology

## 2017-04-28 ENCOUNTER — Encounter (HOSPITAL_COMMUNITY): Payer: Self-pay | Admitting: Certified Registered Nurse Anesthetist

## 2017-04-28 ENCOUNTER — Encounter (HOSPITAL_COMMUNITY)
Admission: RE | Disposition: A | Discharge status: Home / Self Care | Payer: Self-pay | Source: Ambulatory Visit | Attending: Cardiology

## 2017-04-28 DIAGNOSIS — I4891 Unspecified atrial fibrillation: Secondary | ICD-10-CM | POA: Diagnosis present

## 2017-04-28 DIAGNOSIS — I110 Hypertensive heart disease with heart failure: Secondary | ICD-10-CM | POA: Diagnosis not present

## 2017-04-28 DIAGNOSIS — I509 Heart failure, unspecified: Secondary | ICD-10-CM | POA: Diagnosis not present

## 2017-04-28 DIAGNOSIS — Z8673 Personal history of transient ischemic attack (TIA), and cerebral infarction without residual deficits: Secondary | ICD-10-CM | POA: Diagnosis not present

## 2017-04-28 DIAGNOSIS — I2510 Atherosclerotic heart disease of native coronary artery without angina pectoris: Secondary | ICD-10-CM | POA: Diagnosis not present

## 2017-04-28 DIAGNOSIS — E785 Hyperlipidemia, unspecified: Secondary | ICD-10-CM | POA: Insufficient documentation

## 2017-04-28 DIAGNOSIS — I255 Ischemic cardiomyopathy: Secondary | ICD-10-CM | POA: Diagnosis not present

## 2017-04-28 DIAGNOSIS — I480 Paroxysmal atrial fibrillation: Secondary | ICD-10-CM

## 2017-04-28 DIAGNOSIS — Z87891 Personal history of nicotine dependence: Secondary | ICD-10-CM | POA: Insufficient documentation

## 2017-04-28 DIAGNOSIS — G4733 Obstructive sleep apnea (adult) (pediatric): Secondary | ICD-10-CM | POA: Diagnosis not present

## 2017-04-28 DIAGNOSIS — I10 Essential (primary) hypertension: Secondary | ICD-10-CM | POA: Diagnosis not present

## 2017-04-28 HISTORY — PX: ATRIAL FIBRILLATION ABLATION: EP1191

## 2017-04-28 LAB — POCT ACTIVATED CLOTTING TIME
ACTIVATED CLOTTING TIME: 301 s
Activated Clotting Time: 274 seconds
Activated Clotting Time: 175 seconds

## 2017-04-28 SURGERY — ATRIAL FIBRILLATION ABLATION
Anesthesia: General

## 2017-04-28 MED ORDER — FUROSEMIDE 20 MG PO TABS
20.0000 mg | ORAL_TABLET | Freq: Every day | ORAL | Status: DC
  Administered 2017-04-28 – 2017-04-29 (×2): 20 mg via ORAL
  Filled 2017-04-28 (×2): qty 1

## 2017-04-28 MED ORDER — OMEGA-3-ACID ETHYL ESTERS 1 G PO CAPS
1.0000 g | ORAL_CAPSULE | Freq: Every day | ORAL | Status: DC
  Administered 2017-04-29: 1 g via ORAL
  Filled 2017-04-28: qty 1

## 2017-04-28 MED ORDER — HEPARIN (PORCINE) IN NACL 2-0.9 UNIT/ML-% IJ SOLN
INTRAMUSCULAR | Status: AC
  Filled 2017-04-28: qty 500

## 2017-04-28 MED ORDER — LORATADINE 10 MG PO TABS: 10.0000 mg | ORAL_TABLET | Freq: Every day | ORAL | Status: DC | PRN

## 2017-04-28 MED ORDER — LIDOCAINE HCL (CARDIAC) 20 MG/ML IV SOLN
INTRAVENOUS | Status: DC | PRN
  Administered 2017-04-28: 100 mg via INTRAVENOUS

## 2017-04-28 MED ORDER — ACETAMINOPHEN 325 MG PO TABS: 650.0000 mg | ORAL_TABLET | ORAL | Status: DC | PRN

## 2017-04-28 MED ORDER — PROPOFOL 10 MG/ML IV BOLUS
INTRAVENOUS | Status: DC | PRN
  Administered 2017-04-28: 200 mg via INTRAVENOUS
  Administered 2017-04-28: 20 mg via INTRAVENOUS

## 2017-04-28 MED ORDER — BUPIVACAINE HCL (PF) 0.25 % IJ SOLN
INTRAMUSCULAR | Status: AC
  Filled 2017-04-28: qty 30

## 2017-04-28 MED ORDER — PHENYLEPHRINE HCL 10 MG/ML IJ SOLN
INTRAVENOUS | Status: DC | PRN
  Administered 2017-04-28: 50 ug/min via INTRAVENOUS

## 2017-04-28 MED ORDER — AMIODARONE HCL 200 MG PO TABS
200.0000 mg | ORAL_TABLET | Freq: Every day | ORAL | Status: DC
  Administered 2017-04-28 – 2017-04-29 (×2): 200 mg via ORAL
  Filled 2017-04-28 (×2): qty 1

## 2017-04-28 MED ORDER — ASPIRIN EC 81 MG PO TBEC
81.0000 mg | DELAYED_RELEASE_TABLET | Freq: Every day | ORAL | Status: DC
  Administered 2017-04-29: 81 mg via ORAL
  Filled 2017-04-28: qty 1

## 2017-04-28 MED ORDER — FENTANYL CITRATE (PF) 100 MCG/2ML IJ SOLN
INTRAMUSCULAR | Status: DC | PRN
  Administered 2017-04-28: 100 ug via INTRAVENOUS

## 2017-04-28 MED ORDER — NICOTINE POLACRILEX 2 MG MT LOZG
2.0000 mg | LOZENGE | Freq: Three times a day (TID) | OROMUCOSAL | Status: DC | PRN
Start: 2017-04-28 — End: 2017-04-29
  Filled 2017-04-28: qty 1

## 2017-04-28 MED ORDER — POTASSIUM CHLORIDE CRYS ER 10 MEQ PO TBCR
10.0000 meq | EXTENDED_RELEASE_TABLET | Freq: Two times a day (BID) | ORAL | Status: DC
  Administered 2017-04-28 – 2017-04-29 (×2): 10 meq via ORAL
  Filled 2017-04-28 (×2): qty 1

## 2017-04-28 MED ORDER — AMLODIPINE BESYLATE 10 MG PO TABS
10.0000 mg | ORAL_TABLET | Freq: Every day | ORAL | Status: DC
  Administered 2017-04-28 – 2017-04-29 (×2): 10 mg via ORAL
  Filled 2017-04-28 (×2): qty 1

## 2017-04-28 MED ORDER — IRBESARTAN 300 MG PO TABS
300.0000 mg | ORAL_TABLET | Freq: Every day | ORAL | Status: DC
Start: 2017-04-29 — End: 2017-04-29
  Administered 2017-04-29: 11:00:00 300 mg via ORAL
  Filled 2017-04-28: qty 1

## 2017-04-28 MED ORDER — DOBUTAMINE IN D5W 4-5 MG/ML-% IV SOLN
INTRAVENOUS | Status: AC
  Filled 2017-04-28: qty 250

## 2017-04-28 MED ORDER — OFF THE BEAT BOOK
Freq: Once | Status: AC
  Administered 2017-04-28: 21:00:00
  Filled 2017-04-28: qty 1

## 2017-04-28 MED ORDER — NITROGLYCERIN 0.4 MG SL SUBL: 0.4000 mg | SUBLINGUAL_TABLET | SUBLINGUAL | Status: DC | PRN

## 2017-04-28 MED ORDER — ONDANSETRON HCL 4 MG/2ML IJ SOLN
INTRAMUSCULAR | Status: DC | PRN
  Administered 2017-04-28: 4 mg via INTRAVENOUS

## 2017-04-28 MED ORDER — PROTAMINE SULFATE 10 MG/ML IV SOLN
INTRAVENOUS | Status: DC | PRN
  Administered 2017-04-28 (×2): 10 mg via INTRAVENOUS
  Administered 2017-04-28: 20 mg via INTRAVENOUS

## 2017-04-28 MED ORDER — SODIUM CHLORIDE 0.9% FLUSH: 3.0000 mL | INTRAVENOUS | Status: DC | PRN

## 2017-04-28 MED ORDER — ROCURONIUM BROMIDE 100 MG/10ML IV SOLN
INTRAVENOUS | Status: DC | PRN
  Administered 2017-04-28: 40 mg via INTRAVENOUS

## 2017-04-28 MED ORDER — ATORVASTATIN CALCIUM 80 MG PO TABS
80.0000 mg | ORAL_TABLET | Freq: Every day | ORAL | Status: DC
  Administered 2017-04-28: 80 mg via ORAL
  Filled 2017-04-28: qty 1

## 2017-04-28 MED ORDER — SUGAMMADEX SODIUM 200 MG/2ML IV SOLN
INTRAVENOUS | Status: DC | PRN
  Administered 2017-04-28: 200 mg via INTRAVENOUS

## 2017-04-28 MED ORDER — HEPARIN (PORCINE) IN NACL 2-0.9 UNIT/ML-% IJ SOLN
INTRAMUSCULAR | Status: AC | PRN
  Administered 2017-04-28: 2000 mL

## 2017-04-28 MED ORDER — APIXABAN 5 MG PO TABS
5.0000 mg | ORAL_TABLET | Freq: Two times a day (BID) | ORAL | Status: DC
  Administered 2017-04-28 – 2017-04-29 (×2): 5 mg via ORAL
  Filled 2017-04-28 (×2): qty 1

## 2017-04-28 MED ORDER — HEPARIN SODIUM (PORCINE) 1000 UNIT/ML IJ SOLN
INTRAMUSCULAR | Status: AC
  Filled 2017-04-28: qty 1

## 2017-04-28 MED ORDER — SODIUM CHLORIDE 0.9 % IV SOLN: 250.0000 mL | INTRAVENOUS | Status: DC | PRN

## 2017-04-28 MED ORDER — HEPARIN SODIUM (PORCINE) 1000 UNIT/ML IJ SOLN
INTRAMUSCULAR | Status: DC | PRN
  Administered 2017-04-28: 1000 [IU] via INTRAVENOUS

## 2017-04-28 MED ORDER — HEPARIN SODIUM (PORCINE) 1000 UNIT/ML IJ SOLN
INTRAMUSCULAR | Status: DC | PRN
  Administered 2017-04-28: 5000 [IU] via INTRAVENOUS
  Administered 2017-04-28: 15000 [IU] via INTRAVENOUS
  Administered 2017-04-28: 2000 [IU] via INTRAVENOUS

## 2017-04-28 MED ORDER — ISOSORBIDE MONONITRATE ER 60 MG PO TB24
60.0000 mg | ORAL_TABLET | Freq: Every day | ORAL | Status: DC
  Administered 2017-04-29: 60 mg via ORAL
  Filled 2017-04-28: qty 1

## 2017-04-28 MED ORDER — BUPIVACAINE HCL (PF) 0.25 % IJ SOLN
INTRAMUSCULAR | Status: DC | PRN
  Administered 2017-04-28: 20 mL

## 2017-04-28 MED ORDER — ADULT MULTIVITAMIN W/MINERALS CH
1.0000 | ORAL_TABLET | Freq: Every day | ORAL | Status: DC
  Administered 2017-04-29: 1 via ORAL
  Filled 2017-04-28: qty 1

## 2017-04-28 MED ORDER — OMEGA-3 FATTY ACIDS 1000 MG PO CAPS: 1000.0000 mg | ORAL_CAPSULE | Freq: Every day | ORAL | Status: DC

## 2017-04-28 MED ORDER — ONDANSETRON HCL 4 MG/2ML IJ SOLN: 4.0000 mg | Freq: Four times a day (QID) | INTRAMUSCULAR | Status: DC | PRN

## 2017-04-28 MED ORDER — FAMOTIDINE 20 MG PO TABS
40.0000 mg | ORAL_TABLET | Freq: Two times a day (BID) | ORAL | Status: DC
  Administered 2017-04-28 – 2017-04-29 (×2): 40 mg via ORAL
  Filled 2017-04-28 (×2): qty 2

## 2017-04-28 MED ORDER — DOBUTAMINE IN D5W 4-5 MG/ML-% IV SOLN
INTRAVENOUS | Status: DC | PRN
  Administered 2017-04-28: 20 ug/kg/min via INTRAVENOUS

## 2017-04-28 MED ORDER — SODIUM CHLORIDE 0.9 % IV SOLN
INTRAVENOUS | Status: DC
  Administered 2017-04-28 (×2): via INTRAVENOUS

## 2017-04-28 MED ORDER — SODIUM CHLORIDE 0.9% FLUSH: 3.0000 mL | Freq: Two times a day (BID) | INTRAVENOUS | Status: DC

## 2017-04-28 SURGICAL SUPPLY — 20 items
BAG SNAP BAND KOVER 36X36 (MISCELLANEOUS) ×3 IMPLANT
BLANKET WARM UNDERBOD FULL ACC (MISCELLANEOUS) ×3 IMPLANT
CATH SMTCH THERMOCOOL SF DF (CATHETERS) ×3 IMPLANT
CATH SOUNDSTAR ECO REPROCESSED (CATHETERS) ×3 IMPLANT
CATH VARIABLE LASSO NAV 2515 (CATHETERS) ×3 IMPLANT
CATH WEBSTER BI DIR CS D-F CRV (CATHETERS) ×3 IMPLANT
COVER SWIFTLINK CONNECTOR (BAG) ×3 IMPLANT
NEEDLE TRANSSEPTAL BRK 98CM (NEEDLE) ×3 IMPLANT
NEEDLE TRANSSEPTAL BRK XS 98CM (NEEDLE) ×3 IMPLANT
PACK EP LATEX FREE (CUSTOM PROCEDURE TRAY) ×2
PACK EP LF (CUSTOM PROCEDURE TRAY) ×1 IMPLANT
PAD DEFIB LIFELINK (PAD) ×3 IMPLANT
PATCH CARTO3 (PAD) ×3 IMPLANT
SHEATH AGILIS NXT 8.5F 71CM (SHEATH) ×6 IMPLANT
SHEATH AVANTI 11F 11CM (SHEATH) ×3 IMPLANT
SHEATH BAYLIS TRANSSEPTAL 98CM (NEEDLE) ×3 IMPLANT
SHEATH PINNACLE 7F 10CM (SHEATH) ×3 IMPLANT
SHEATH PINNACLE 8F 10CM (SHEATH) ×6 IMPLANT
SHEATH PINNACLE 9F 10CM (SHEATH) ×6 IMPLANT
TUBING SMART ABLATE COOLFLOW (TUBING) ×3 IMPLANT

## 2017-04-28 NOTE — Progress Notes (Signed)
Site area: 2 lt fv sheaths Site Prior to Removal:  Level 0 Pressure Applied For: 20 minutes Manual:   yes Patient Status During Pull:  stable Post Pull Site:  Level  0 Post Pull Instructions Given:  yes Post Pull Pulses Present: yes Dressing Applied:  Small tegaderm Bedrest begins @  1700 Comments:  IV saline locked

## 2017-04-28 NOTE — Anesthesia Procedure Notes (Signed)
Procedure Name: Intubation Date/Time: 04/28/2017 12:25 PM Performed by: Candis Shine Pre-anesthesia Checklist: Patient identified, Emergency Drugs available, Suction available and Patient being monitored Patient Re-evaluated:Patient Re-evaluated prior to inductionOxygen Delivery Method: Circle System Utilized Preoxygenation: Pre-oxygenation with 100% oxygen Intubation Type: IV induction Ventilation: Two handed mask ventilation required Laryngoscope Size: Mac and 4 Grade View: Grade I Tube type: Oral Tube size: 7.5 mm Number of attempts: 1 Airway Equipment and Method: Stylet Placement Confirmation: ETT inserted through vocal cords under direct vision,  positive ETCO2 and breath sounds checked- equal and bilateral Secured at: 22 cm Tube secured with: Tape Dental Injury: Teeth and Oropharynx as per pre-operative assessment

## 2017-04-28 NOTE — Discharge Summary (Signed)
ELECTROPHYSIOLOGY PROCEDURE DISCHARGE SUMMARY    Patient ID: Rodney [name],  MRN: [id number], DOB/AGE: [birth year] 76 y.o.  Admit date: 04/28/2017 Discharge date: 04/29/2017  Primary Care Physician: [name], MD Primary Cardiologist: [facility] Electrophysiologist: [facility]  Primary Discharge Diagnosis:  Paroxysmal atrial fibrillation  Secondary Discharge Diagnosis:  1.  HTN 2.  ICM 3.  CAD  Procedures This Admission:  1.  Electrophysiology study and radiofrequency catheter ablation on 04/28/17 by Dr [name].  This study demonstrated sinus rhythm upon presentation; successful electrical isolation and anatomical encircling of all four pulmonary veins with radiofrequency current; no inducible arrhythmias following ablation both on and off of dobutamine; no early apparent complications.    Brief HPI: Rodney [name] is a 76 y.o. male with a history of paroxysmal atrial fibrillation.  They have failed medical therapy with Tikosyn and amiodarone. Risks, benefits, and alternatives to catheter ablation of atrial fibrillation were reviewed with the patient who wished to proceed.  The patient underwent cardiac CT prior to the procedure which demonstrated no LAA thrombus.    Hospital Course:  The patient was admitted and underwent EPS/RFCA of atrial fibrillation with details as outlined above.  They were monitored on telemetry overnight which demonstrated sinus brady with rare PAC.  Groin was without complication on the day of discharge.  The patient was examined and considered to be stable for discharge.  Wound care and restrictions were reviewed with the patient.  The patient will be seen back by Roderic Palau, NP in 4 weeks and Dr [name] in 12 weeks for post ablation follow up.   This patients CHA2DS2-VASc Score and unadjusted Ischemic Stroke Rate (% per year) is equal to 3.2 % stroke rate/year from a score of 3 Above score calculated as 1 point each if present [CHF, HTN, DM,  Vascular=MI/PAD/Aortic Plaque, Age if 65-74, or Male] Above score calculated as 2 points each if present [Age > 75, or Stroke/TIA/TE]   Physical Exam: Vitals:   04/28/17 1939 04/28/17 2000 04/29/17 0103 04/29/17 0746  BP: 137/61 (!) 129/59 131/69 135/65  Pulse: 61 61 (!) 57   Resp: 15 15 16 18   Temp: 98.1 F (36.7 C)  98.1 F (36.7 C) 97.4 F (36.3 C)  TempSrc: Oral  Oral Oral  SpO2: 100% 97% 97% 96%  Weight:   225 lb 15.5 oz (102.5 kg)   Height:        GEN- The patient is well appearing, alert and oriented x 3 today.   HEENT: normocephalic, atraumatic; sclera clear, conjunctiva pink; hearing intact; oropharynx clear; neck supple  Lungs- Clear to ausculation bilaterally, normal work of breathing.  No wheezes, rales, rhonchi Heart- Regular rate and rhythm, no murmurs, rubs or gallops  GI- soft, non-tender, non-distended, bowel sounds present  Extremities- no clubbing, cyanosis, or edema; DP/PT/radial pulses 2+ bilaterally, groin without hematoma/bruit MS- no significant deformity or atrophy Skin- warm and dry, no rash or lesion Psych- euthymic mood, full affect Neuro- strength and sensation are intact   Labs:   Lab Results  Component Value Date   WBC 3.9 04/25/2017   HGB 14.7 03/20/2017   HCT 41.3 04/25/2017   MCV 90 04/25/2017   PLT 163 04/25/2017     Recent Labs Lab 04/25/17 1310  NA 142  K 4.6  CL 105  CO2 20  BUN 12  CREATININE 1.20  CALCIUM 9.2  GLUCOSE 96     Discharge Medications:  Allergies as of 04/29/2017      Reactions  Lisinopril Cough   Iohexol Itching, Rash, Other (See Comments)    Code: RASH, Desc: PATIENT STATED THAT HE BEGAN ITCHING TOWARDS END OF INJECTION OF IV CONTRAST-- 13 HR PREP RECOMMENDED/MMS      Medication List    STOP taking these medications   predniSONE 50 MG tablet Commonly known as:  DELTASONE     TAKE these medications   amiodarone 200 MG tablet Commonly known as:  PACERONE Take 1 tablet (200 mg total) by  mouth daily. What changed:  additional instructions   amLODipine 10 MG tablet Commonly known as:  NORVASC TAKE 1 TABLET BY MOUTH EVERY DAY   apixaban 5 MG Tabs tablet Commonly known as:  ELIQUIS Take 1 tablet (5 mg total) by mouth 2 (two) times daily.   aspirin EC 81 MG tablet Take 81 mg by mouth daily.   atorvastatin 80 MG tablet Commonly known as:  LIPITOR Take 1 tablet (80 mg total) by mouth daily. What changed:  when to take this   diltiazem 30 MG tablet Commonly known as:  CARDIZEM TAKE 1 TABLET EVERY 4 HOURS AS NEEDED FOR IRREGULAR HR>100 AND BP>100   famotidine 40 MG tablet Commonly known as:  PEPCID Take 1 tablet (40 mg total) by mouth 2 (two) times daily.   fish oil-omega-3 fatty acids 1000 MG capsule Take 1,000 mg by mouth daily.   furosemide 20 MG tablet Commonly known as:  LASIX Take 1 tablet (20 mg total) by mouth daily.   irbesartan 300 MG tablet Commonly known as:  AVAPRO Take 1 tablet (300 mg total) by mouth daily.   isosorbide mononitrate 60 MG 24 hr tablet Commonly known as:  IMDUR TAKE 1 TABLET BY MOUTH EVERY DAY   loratadine 10 MG tablet Commonly known as:  CLARITIN Take 10 mg by mouth daily as needed (seasonal allergies).   multivitamin with minerals Tabs tablet Take 1 tablet by mouth daily.   nicotine polacrilex 2 MG lozenge Commonly known as:  COMMIT Take 2 mg by mouth 3 (three) times daily as needed for smoking cessation.   nitroGLYCERIN 0.4 MG SL tablet Commonly known as:  NITROSTAT Place 1 tablet (0.4 mg total) under the tongue every 5 (five) minutes as needed for chest pain (CP or SOB). What changed:  reasons to take this   potassium chloride 10 MEQ tablet Commonly known as:  KLOR-CON M10 Take 1 tablet (10 mEq total) by mouth 2 (two) times daily.       Disposition:   Follow-up Information    [facility] Follow up on 05/29/2017.   Specialty:  Cardiology Why:  at 11:30AM  Contact  information: [address] [id number] [place] [address] [phone]       Constance Haw, MD Follow up on 08/02/2017.   Specialty:  Cardiology Why:  at 8:30AM  Contact information: [place] [address] [phone]           Duration of Discharge Encounter: Greater than 30 minutes including physician time.  I did discuss stopping amiodarone as an option at this time.  He would prefer to stay on amiodarone until his follow-up visit with AF clinic.  Importance of compliance with eliquis without interruption over the next 3 months was discussed with the patient.  Army Fossa MD 04/29/2017 10:00 AM

## 2017-04-28 NOTE — Discharge Instructions (Signed)
No driving for 4 days. No lifting over 5 lbs for 1 week. No sexual activity for 1 week. You may return to work in 1 week. Keep procedure site clean & dry. If you notice increased pain, swelling, bleeding or pus, call/return!  You may shower, but no soaking baths/hot tubs/pools for 1 week.  ° ° °You have an appointment set up with the Atrial Fibrillation Clinic.  Multiple studies have shown that being followed by a dedicated atrial fibrillation clinic in addition to the standard care you receive from your other physicians improves health. We believe that enrollment in the atrial fibrillation clinic will allow us to better care for you.  ° °The phone number to the Atrial Fibrillation Clinic is [phone]. The clinic is staffed Monday through Friday from 8:30am to 5pm. ° °Parking Directions: The clinic is located in the Heart and Vascular Building connected to [facility]. °1)From [address] turn on to [address] and go to the 3rd entrance  (Heart and Vascular entrance) on the right. °2)Look to the right for Heart &Vascular Parking Garage. °3)A code for the entrance is required please call the clinic to receive this.   °4)Take the elevators to the 1st floor. Registration is in the room with the glass walls at the end of the hallway. ° °If you have any trouble parking or locating the clinic, please don’t hesitate to call [phone]. ° ° °

## 2017-04-28 NOTE — Anesthesia Preprocedure Evaluation (Signed)
Anesthesia Evaluation    Airway Mallampati: I       Dental no notable dental hx.    Pulmonary former smoker,    Pulmonary exam normal breath sounds clear to auscultation       Cardiovascular hypertension, Normal cardiovascular exam Rhythm:Irregular Rate:Normal     Neuro/Psych negative psych ROS   GI/Hepatic negative GI ROS, Neg liver ROS,   Endo/Other  negative endocrine ROS  Renal/GU negative Renal ROS     Musculoskeletal   Abdominal Normal abdominal exam  (+)   Peds  Hematology negative hematology ROS (+)   Anesthesia Other Findings Rodney [name]  ECHO COMPLETE WO IMAGE ENHANCING AGENT AND WITH 3D  Order# [id number]  Reading physician: Lelon Perla, MD Ordering physician: [name], MD Study date: 04/19/16 Result Notes   Notes Recorded by Fidel Levy, RN on 04/19/2016 at 11:13 AM Patient called with results. ------  Notes Recorded by [name], MD on 04/19/2016 at 10:20 AM Good news, EF has improved from last echo to 45-50%.  Dr. Lemmie Evens   Study Result   Result status: Final result                          [name]                        [address], [place] [address]                            [phone]  ------------------------------------------------------------------- Transthoracic Echocardiography  Patient:    Rodney [name], Rodney [name] MR #:       [id number] Study Date: 04/19/2016 Gender:     M Age:        76 Height:     188 cm Weight:     98.2 kg BSA:        2.28 m^2 Pt. Status: Room:   [place], [place]     Lyman Bishop MD  [place] MD  PERFORMING   [facility], Outpatient  cc:  ------------------------------------------------------------------- LV EF: 45% -   50%  ------------------------------------------------------------------- Indications:      Ischemic  cardiomyopathy (I25.5). Global strain and Heart Model on Philips Epiq 7.  ------------------------------------------------------------------- History:   PMH:   Dyspnea.  Atrial fibrillation.  Coronary artery disease.  Congestive heart failure.  Ischemic cardiomyopathy. Transient ischemic attack.  Risk factors:  OSA. AAA s/p repair. Former tobacco use. Hypertension. Dyslipidemia.  ------------------------------------------------------------------- Study Conclusions  - Left ventricle: The cavity size was mildly dilated. Wall   thickness was increased in a pattern of mild LVH. Systolic   function was mildly reduced. The estimated ejection fraction was   in the range of 45% to 50%. Diffuse hypokinesis. Doppler   parameters are consistent with abnormal left ventricular   relaxation (grade 1 diastolic dysfunction). - Aortic root: The aortic root was mildly dilated. - Left atrium: The atrium was mildly dilated. - Pulmonary arteries: Systolic pressure was mildly to moderately   increased. PA peak pressure: 44 mm Hg (S).  Impressions:  - Mild global reduction in LV function; grade 1 diastolic   dysfunction; mild LVH and LVE;  mild LAE; trace MR and TR; mild to   moderate elevation in pulmonary pressure.  ------------------------------------------------------------------- Labs, prior tests, procedures, and surgery: Transthoracic echocardiography (03/03/2015).     EF was 40%. GLS: -13.4%.  Transthoracic echocardiography.  M-mode, complete 2D, 3D, spectral Doppler, and color Doppler.  Birthdate:  Patient birthdate: 21-Sep-1941.  Age:  Patient is 76 yr old.  Sex:  Gender: male. BMI: 27.8 kg/m^2.  Blood pressure:     142/77  Patient status: Outpatient.  Study date:  Study date: 04/19/2016. Study time: 09:07 AM.  Location:  [name]  -------------------------------------------------------------------    Reproductive/Obstetrics                              Anesthesia Physical Anesthesia Plan  ASA: III  Anesthesia Plan: General   Post-op Pain Management:    Induction: Intravenous  Airway Management Planned: Oral ETT  Additional Equipment:   Intra-op Plan:   Post-operative Plan: Extubation in OR  Informed Consent: I have reviewed the patients History and Physical, chart, labs and discussed the procedure including the risks, benefits and alternatives for the proposed anesthesia with the patient or authorized representative who has indicated his/her understanding and acceptance.     Plan Discussed with: CRNA and Surgeon  Anesthesia Plan Comments:         Anesthesia Quick Evaluation

## 2017-04-28 NOTE — Transfer of Care (Signed)
Immediate Anesthesia Transfer of Care Note  Patient: Rodney [name]  Procedure(s) Performed: Procedure(s): Atrial Fibrillation Ablation (N/A)  Patient Location: Cath Lab  Anesthesia Type:General  Level of Consciousness: awake, alert  and oriented  Airway & Oxygen Therapy: Patient Spontanous Breathing and Patient connected to nasal cannula oxygen  Post-op Assessment: Report given to RN and Post -op Vital signs reviewed and stable  Post vital signs: Reviewed and stable  Last Vitals:  Vitals:   04/28/17 0853  BP: 124/83  Pulse: 71  Temp: 36.6 C    Last Pain:  Vitals:   04/28/17 0853  TempSrc: Oral         Complications: No apparent anesthesia complications

## 2017-04-28 NOTE — Progress Notes (Signed)
Site area: 2 rt fv sheaths Site Prior to Removal:  Level 0 Pressure Applied For: 25 minutes Manual:   yes Patient Status During Pull:  stable Post Pull Site:  Level 0 Post Pull Instructions Given:  yes Post Pull Pulses Present: yes Dressing Applied:   Bedrest begins @  Comments:

## 2017-04-28 NOTE — H&P (Signed)
Rodney [name] is a 76 y.o. male with a history of paroxysmal atrial fibrillation. He presents today for ablation. He is currently on amiodarone and tolerating it well. On exam, regular rhythm, no murmurs, lungs clear. Risks and benefits of the procedure were discussed. Risks include but not limited to bleeding, tamponade, heart block, stroke, and damage to surrounding organs. He understands the risks of the procedure and has agreed to ablation.  Will [name], MD 04/28/2017 11:45 AM

## 2017-04-29 ENCOUNTER — Encounter (HOSPITAL_COMMUNITY): Payer: Self-pay | Admitting: Physician Assistant

## 2017-04-29 DIAGNOSIS — I2510 Atherosclerotic heart disease of native coronary artery without angina pectoris: Secondary | ICD-10-CM | POA: Diagnosis not present

## 2017-04-29 DIAGNOSIS — I481 Persistent atrial fibrillation: Secondary | ICD-10-CM

## 2017-04-29 DIAGNOSIS — I255 Ischemic cardiomyopathy: Secondary | ICD-10-CM | POA: Diagnosis not present

## 2017-04-29 DIAGNOSIS — I10 Essential (primary) hypertension: Secondary | ICD-10-CM | POA: Diagnosis not present

## 2017-04-29 DIAGNOSIS — I480 Paroxysmal atrial fibrillation: Secondary | ICD-10-CM | POA: Diagnosis not present

## 2017-04-29 MED ORDER — AMIODARONE HCL 200 MG PO TABS: 200.0000 mg | ORAL_TABLET | Freq: Every day | ORAL | 3 refills | Status: DC

## 2017-04-29 NOTE — Anesthesia Postprocedure Evaluation (Addendum)
Anesthesia Post Note  Patient: Rodney [name]  Procedure(s) Performed: Procedure(s) (LRB): Atrial Fibrillation Ablation (N/A)  Patient location during evaluation: Cath Lab Anesthesia Type: General Level of consciousness: awake, awake and alert and oriented Pain management: pain level controlled Vital Signs Assessment: post-procedure vital signs reviewed and stable Respiratory status: spontaneous breathing, nonlabored ventilation and respiratory function stable Cardiovascular status: blood pressure returned to baseline Anesthetic complications: no       Last Vitals:  Vitals:   04/29/17 0103 04/29/17 0746  BP: 131/69 135/65  Pulse: (!) 57   Resp: 16 18  Temp: 36.7 C 36.3 C    Last Pain:  Vitals:   04/29/17 0746  TempSrc: Oral                 Bellarae Lizer COKER

## 2017-05-01 ENCOUNTER — Encounter (HOSPITAL_COMMUNITY): Payer: Self-pay | Admitting: Cardiology

## 2017-05-05 ENCOUNTER — Telehealth: Payer: Self-pay | Admitting: Cardiology

## 2017-05-05 NOTE — Telephone Encounter (Signed)
Pt asking when he can return to swimming/golfing.  Informed pt after today he is cleared to return to normal activities.  He was excited to hear that news and thanks me for returning his call.

## 2017-05-05 NOTE — Telephone Encounter (Signed)
New message    Pt is calling stating he has a question to ask RN about his ablation surgery.

## 2017-05-10 ENCOUNTER — Other Ambulatory Visit: Payer: Self-pay

## 2017-05-10 MED ORDER — IRBESARTAN 300 MG PO TABS: 300.0000 mg | ORAL_TABLET | Freq: Every day | ORAL | 2 refills | Status: DC

## 2017-05-10 MED ORDER — ISOSORBIDE MONONITRATE ER 60 MG PO TB24: 60.0000 mg | ORAL_TABLET | Freq: Every day | ORAL | 2 refills | Status: DC

## 2017-05-18 ENCOUNTER — Other Ambulatory Visit: Payer: Self-pay | Admitting: Internal Medicine

## 2017-05-18 ENCOUNTER — Other Ambulatory Visit: Payer: Self-pay | Admitting: *Deleted

## 2017-05-18 MED ORDER — AMLODIPINE BESYLATE 10 MG PO TABS: 10.0000 mg | ORAL_TABLET | Freq: Every day | ORAL | 3 refills | Status: DC

## 2017-05-29 ENCOUNTER — Encounter (HOSPITAL_COMMUNITY): Payer: Self-pay | Admitting: Nurse Practitioner

## 2017-05-29 ENCOUNTER — Ambulatory Visit (HOSPITAL_COMMUNITY)
Admission: RE | Admit: 2017-05-29 | Discharge: 2017-05-29 | Disposition: A | Discharge status: Home / Self Care | Payer: Medicare Other | Source: Ambulatory Visit | Attending: Nurse Practitioner | Admitting: Nurse Practitioner

## 2017-05-29 VITALS — BP 140/78 | Ht 74.0 in | Wt 222.2 lb

## 2017-05-29 DIAGNOSIS — I10 Essential (primary) hypertension: Secondary | ICD-10-CM | POA: Diagnosis not present

## 2017-05-29 DIAGNOSIS — M109 Gout, unspecified: Secondary | ICD-10-CM | POA: Diagnosis not present

## 2017-05-29 DIAGNOSIS — G4733 Obstructive sleep apnea (adult) (pediatric): Secondary | ICD-10-CM | POA: Diagnosis not present

## 2017-05-29 DIAGNOSIS — Z9889 Other specified postprocedural states: Secondary | ICD-10-CM | POA: Insufficient documentation

## 2017-05-29 DIAGNOSIS — Z87891 Personal history of nicotine dependence: Secondary | ICD-10-CM | POA: Insufficient documentation

## 2017-05-29 DIAGNOSIS — Z823 Family history of stroke: Secondary | ICD-10-CM | POA: Diagnosis not present

## 2017-05-29 DIAGNOSIS — Z8042 Family history of malignant neoplasm of prostate: Secondary | ICD-10-CM | POA: Insufficient documentation

## 2017-05-29 DIAGNOSIS — Z833 Family history of diabetes mellitus: Secondary | ICD-10-CM | POA: Diagnosis not present

## 2017-05-29 DIAGNOSIS — Z8679 Personal history of other diseases of the circulatory system: Secondary | ICD-10-CM | POA: Insufficient documentation

## 2017-05-29 DIAGNOSIS — Z7901 Long term (current) use of anticoagulants: Secondary | ICD-10-CM | POA: Insufficient documentation

## 2017-05-29 DIAGNOSIS — E785 Hyperlipidemia, unspecified: Secondary | ICD-10-CM | POA: Insufficient documentation

## 2017-05-29 DIAGNOSIS — I2510 Atherosclerotic heart disease of native coronary artery without angina pectoris: Secondary | ICD-10-CM | POA: Diagnosis not present

## 2017-05-29 DIAGNOSIS — I714 Abdominal aortic aneurysm, without rupture: Secondary | ICD-10-CM | POA: Diagnosis not present

## 2017-05-29 DIAGNOSIS — Z8249 Family history of ischemic heart disease and other diseases of the circulatory system: Secondary | ICD-10-CM | POA: Diagnosis not present

## 2017-05-29 DIAGNOSIS — Z888 Allergy status to other drugs, medicaments and biological substances status: Secondary | ICD-10-CM | POA: Diagnosis not present

## 2017-05-29 DIAGNOSIS — I480 Paroxysmal atrial fibrillation: Secondary | ICD-10-CM | POA: Insufficient documentation

## 2017-05-29 DIAGNOSIS — Z79899 Other long term (current) drug therapy: Secondary | ICD-10-CM | POA: Diagnosis not present

## 2017-05-29 DIAGNOSIS — Z7982 Long term (current) use of aspirin: Secondary | ICD-10-CM | POA: Diagnosis not present

## 2017-05-29 DIAGNOSIS — Z955 Presence of coronary angioplasty implant and graft: Secondary | ICD-10-CM | POA: Diagnosis not present

## 2017-05-29 NOTE — Progress Notes (Signed)
Patient ID: Rodney [name], male   DOB: 1941/03/21, 76 y.o.   MRN: [id number]     Primary Care Physician: [name], MD Referring Physician: Dr. Debara Pickett Cardiologist: Dr. Debarah Crape Rodney [name] is a 76 y.o. male with a h/o PAF on Tikosyn 250 mg bid, CAD s/p stenting of bifurcation stenosis involving the circumflex, 02/25/15, AAA s/p stent repair, HTN, HLD. Afib burden increased and it was decided to stop tikosyn, start amiodarone and plan for ablation.   He is in afib clinic for f/u after ablation, 04/28/17. He has done well after the procedure without any afib, swallowing issues, or  groin issues.He continues amiodarone but is looking forward to stopping amiodarone at the end of the 3 month healing period.  Today, he denies symptoms of palpitations, chest pain, shortness of breath, orthopnea, PND, lower extremity edema, dizziness, presyncope, syncope, or neurologic sequela. The patient is tolerating medications without difficulties and is otherwise without complaint today.   Past Medical History:  Diagnosis Date  . AAA (abdominal aortic aneurysm) ([place])    stent graft put in 02/2009  . Arthritis   . CAD (coronary artery disease)    a. cath 2010 b. CAD s/p PCI to ramus and LCX on 03/04/15  . Gout   . Hyperlipidemia   . Hypertension   . OSA (obstructive sleep apnea) 09/30/2015   Very mild with AHI 6.2/hr  . Paroxysmal atrial fibrillation ([facility])   . Tobacco abuse    Past Surgical History:  Procedure Laterality Date  . ABDOMINAL AORTIC ANEURYSM REPAIR  02/19/2009   performed by [facility]  . ATRIAL FIBRILLATION ABLATION N/A 04/28/2017   Procedure: Atrial Fibrillation Ablation;  Surgeon: Constance Haw, MD;  Location: [facility];  Service: Cardiovascular;  Laterality: N/A;  . COLONOSCOPY    . KNEE ARTHROSCOPY  2003   left  . LEFT AND RIGHT HEART CATHETERIZATION WITH CORONARY ANGIOGRAM N/A 03/03/2015   Procedure: LEFT AND RIGHT HEART CATHETERIZATION WITH CORONARY ANGIOGRAM;   Surgeon: Troy Sine, MD;    . PERCUTANEOUS CORONARY STENT INTERVENTION (PCI-S) N/A 03/04/2015   Procedure: PERCUTANEOUS CORONARY STENT INTERVENTION (PCI-S);  Surgeon: Troy Sine, MD; RI 99>>0% w/   BMS, bifurcation CFX-OM 90>>40% w/ Angiosculpt PTCA; dCFX 90>>0% w/   2.7520 mm Rebel BMS  . SHOULDER ARTHROSCOPY WITH ROTATOR CUFF REPAIR AND SUBACROMIAL DECOMPRESSION Left 11/19/2013   Procedure: LEFT SHOULDER ARTHROSCOPY DEBRIDEMENT EXTENTSIVE DISTAL CLAVICULECTOMY DECOMPRESSION PARTIAL ACROMIOPLASTY WITH CORACOACROMIAL WITH ROTATOR CUFF REPAIR ;  Surgeon: Renette Butters, MD;  Location: [facility];  Service: Orthopedics;  Laterality: Left;    Current Outpatient Prescriptions  Medication Sig Dispense Refill  . amiodarone (PACERONE) 200 MG tablet Take 1 tablet (200 mg total) by mouth daily. 30 tablet 3  . amLODipine (NORVASC) 10 MG tablet Take 1 tablet (10 mg total) by mouth daily. 90 tablet 3  . apixaban (ELIQUIS) 5 MG TABS tablet Take 1 tablet (5 mg total) by mouth 2 (two) times daily. 180 tablet 3  . aspirin EC 81 MG tablet Take 81 mg by mouth daily.    Marland Kitchen atorvastatin (LIPITOR) 80 MG tablet Take 1 tablet (80 mg total) by mouth daily. (Patient taking differently: Take 80 mg by mouth at bedtime. ) 90 tablet 3  . famotidine (PEPCID) 40 MG tablet Take 1 tablet (40 mg total) by mouth 2 (two) times daily. 180 tablet 1  . fish oil-omega-3 fatty acids 1000 MG capsule Take 1,000 mg by mouth  daily.     . furosemide (LASIX) 20 MG tablet Take 1 tablet (20 mg total) by mouth daily. 30 tablet 11  . irbesartan (AVAPRO) 300 MG tablet Take 1 tablet (300 mg total) by mouth daily. 90 tablet 2  . isosorbide mononitrate (IMDUR) 60 MG 24 hr tablet Take 1 tablet (60 mg total) by mouth daily. 90 tablet 2  . loratadine (CLARITIN) 10 MG tablet Take 10 mg by mouth daily as needed (seasonal allergies).     . Multiple Vitamin (MULTIVITAMIN WITH MINERALS) TABS tablet Take 1 tablet by mouth daily.    .  nicotine polacrilex (COMMIT) 2 MG lozenge Take 2 mg by mouth 3 (three) times daily as needed for smoking cessation.    . nitroGLYCERIN (NITROSTAT) 0.4 MG SL tablet Place 1 tablet (0.4 mg total) under the tongue every 5 (five) minutes as needed for chest pain (CP or SOB). (Patient taking differently: Place 0.4 mg under the tongue every 5 (five) minutes as needed for chest pain (shortness of breath). ) 25 tablet 12  . potassium chloride (KLOR-CON M10) 10 MEQ tablet Take 1 tablet (10 mEq total) by mouth 2 (two) times daily. 180 tablet 3  . diltiazem (CARDIZEM) 30 MG tablet TAKE 1 TABLET EVERY 4 HOURS AS NEEDED FOR IRREGULAR HR>100 AND BP>100 (Patient not taking: Reported on 05/29/2017) 45 tablet 2   No current facility-administered medications for this encounter.     Allergies  Allergen Reactions  . Lisinopril Cough  . Iohexol Itching, Rash and Other (See Comments)     Code: RASH, Desc: PATIENT STATED THAT HE BEGAN ITCHING TOWARDS END OF INJECTION OF IV CONTRAST-- 13 HR PREP RECOMMENDED/MMS     Social History   Social History  . Marital status: Married    Spouse name: N/A  . Number of children: N/A  . Years of education: N/A   Occupational History  . Not on file.   Social History Main Topics  . Smoking status: Former Smoker    Packs/day: 0.50    Years: 50.00    Types: Cigarettes    Quit date: 03/01/2016  . Smokeless tobacco: Never Used  . Alcohol use 4.2 - 4.8 oz/week    6 Cans of beer, 1 - 2 Shots of liquor per week     Comment: couple shots of royal per day  . Drug use: No  . Sexual activity: Not Currently     Comment: cutting down 1/2 ppd   Other Topics Concern  . Not on file   Social History Narrative   Lives with wife, does not use assist, drives.  No home health services.      Family History  Problem Relation Age of Onset  . Prostate cancer Father   . Hypertension Mother   . Diabetes Sister   . Hypertension Sister   . Stroke Neg Hx   . Heart attack Neg Hx      ROS- All systems are reviewed and negative except as per the HPI above  Physical Exam: Vitals:   05/29/17 1121  BP: 140/78  Weight: 222 lb 3.2 oz (100.8 kg)  Height: 6\' 2"  (1.88 m)    GEN- The patient is well appearing, alert and oriented x 3 today.   Head- normocephalic, atraumatic Eyes-  Sclera clear, conjunctiva pink Ears- hearing intact Oropharynx- clear Neck- supple, no JVP Lymph- no cervical lymphadenopathy Lungs- Clear to ausculation bilaterally, normal work of breathing Heart-Regular rate and rhythm, no murmurs, rubs or gallops, PMI not  laterally displaced GI- soft, NT, ND, + BS Extremities- no clubbing, cyanosis, or edema MS- no significant deformity or atrophy Skin- no rash or lesion Psych- euthymic mood, full affect Neuro- strength and sensation are intact  EKG-Sinus rhythm at 70 bpm, NS st/t wave abnormality,  Pr int 188 ms, qrs int 94 ms, qtc 447 ms(stable)   Assessment and Plan:  1.  Paroxysmal afib, failing tikosyn,  with subsequent amiodarone loading and s/p ablation Maintainging SR since procedure Continue apixaban Continue  Amiodarone for now but will likely be stopped at end of 3 month post ablation healing period Continue apixaban for CHA2DS2VASc score of at least 4  2. Lifestyle factors contributing to afib burden Cautioned re alcohol and afib burden Continue with smoking cessation   Recent sleep study with mild sleep apnea, no cpap required Continue exercise efforts   3. CAD Appears stable Continue asa,stain, bb, nitro   4. HTN Stable   F/u 2 months with Dr. [name] F/u with Dr. Debara Pickett as scheduled  afib clinic as needed  Geroge Baseman. Mannie Ohlin, [facility] [address] [place], [place] [address] [phone]

## 2017-06-28 ENCOUNTER — Other Ambulatory Visit: Payer: Self-pay | Admitting: Internal Medicine

## 2017-07-17 ENCOUNTER — Encounter: Payer: Self-pay | Admitting: Cardiology

## 2017-08-02 ENCOUNTER — Encounter: Payer: Self-pay | Admitting: Cardiology

## 2017-08-02 ENCOUNTER — Ambulatory Visit (INDEPENDENT_AMBULATORY_CARE_PROVIDER_SITE_OTHER): Payer: Medicare Other | Admitting: Cardiology

## 2017-08-02 VITALS — BP 142/72 | HR 53 | Ht 74.0 in | Wt 220.2 lb

## 2017-08-02 DIAGNOSIS — I2510 Atherosclerotic heart disease of native coronary artery without angina pectoris: Secondary | ICD-10-CM

## 2017-08-02 DIAGNOSIS — I480 Paroxysmal atrial fibrillation: Secondary | ICD-10-CM | POA: Diagnosis not present

## 2017-08-02 DIAGNOSIS — I255 Ischemic cardiomyopathy: Secondary | ICD-10-CM

## 2017-08-02 DIAGNOSIS — I10 Essential (primary) hypertension: Secondary | ICD-10-CM

## 2017-08-02 NOTE — Progress Notes (Signed)
Electrophysiology Office Note   Date:  08/02/2017   ID:  Candelario, Steppe 1941-11-16, MRN [id number]  PCP:  [name], MD  Cardiologist:  Debara Pickett Primary Electrophysiologist:  Arish Redner Meredith Leeds, MD    Chief Complaint  Patient presents with  . Atrial Fibrillation     History of Present Illness: Rodney [name] is a 76 y.o. male who is being seen today for the evaluation of atrial fibrillation at the request of [name], MD. Presenting today for electrophysiology evaluation. He has a history of AAA, coronary artery disease status post stent to the ramus and circumflex, hypertension, hyperlipidemia, OSA, and paroxysmal atrial fibrillation. He was hospitalized for atrial fibrillation at the beginning of April. He had previously been on dofetilide, which was stopped. Had AF ablation 04/28/17.  Today, denies symptoms of palpitations, chest pain, shortness of breath, orthopnea, PND, lower extremity edema, claudication, dizziness, presyncope, syncope, bleeding, or neurologic sequela. The patient is tolerating medications without difficulties and is otherwise without complaint today. He does not feel like he has gone back into atrial fibrillation. That being said, he is feeling episodes of fatigue that is constant. His heart rate is in the 50s today. He has been able to play golf multiple times a week.   Past Medical History:  Diagnosis Date  . AAA (abdominal aortic aneurysm) ([place])    stent graft put in 02/2009  . Arthritis   . CAD (coronary artery disease)    a. cath 2010 b. CAD s/p PCI to ramus and LCX on 03/04/15  . Gout   . Hyperlipidemia   . Hypertension   . OSA (obstructive sleep apnea) 09/30/2015   Very mild with AHI 6.2/hr  . Paroxysmal atrial fibrillation ([facility])   . Tobacco abuse    Past Surgical History:  Procedure Laterality Date  . ABDOMINAL AORTIC ANEURYSM REPAIR  02/19/2009   performed by [facility]  . ATRIAL FIBRILLATION ABLATION N/A 04/28/2017   Procedure: Atrial  Fibrillation Ablation;  Surgeon: Constance Haw, MD;  Location: [facility];  Service: Cardiovascular;  Laterality: N/A;  . COLONOSCOPY    . KNEE ARTHROSCOPY  2003   left  . LEFT AND RIGHT HEART CATHETERIZATION WITH CORONARY ANGIOGRAM N/A 03/03/2015   Procedure: LEFT AND RIGHT HEART CATHETERIZATION WITH CORONARY ANGIOGRAM;  Surgeon: Troy Sine, MD;    . PERCUTANEOUS CORONARY STENT INTERVENTION (PCI-S) N/A 03/04/2015   Procedure: PERCUTANEOUS CORONARY STENT INTERVENTION (PCI-S);  Surgeon: Troy Sine, MD; RI 99>>0% w/   BMS, bifurcation CFX-OM 90>>40% w/ Angiosculpt PTCA; dCFX 90>>0% w/   2.7520 mm Rebel BMS  . SHOULDER ARTHROSCOPY WITH ROTATOR CUFF REPAIR AND SUBACROMIAL DECOMPRESSION Left 11/19/2013   Procedure: LEFT SHOULDER ARTHROSCOPY DEBRIDEMENT EXTENTSIVE DISTAL CLAVICULECTOMY DECOMPRESSION PARTIAL ACROMIOPLASTY WITH CORACOACROMIAL WITH ROTATOR CUFF REPAIR ;  Surgeon: Renette Butters, MD;  Location: [facility];  Service: Orthopedics;  Laterality: Left;     Current Outpatient Prescriptions  Medication Sig Dispense Refill  . amiodarone (PACERONE) 200 MG tablet Take 1 tablet (200 mg total) by mouth daily. 30 tablet 3  . amLODipine (NORVASC) 10 MG tablet Take 1 tablet (10 mg total) by mouth daily. 90 tablet 3  . apixaban (ELIQUIS) 5 MG TABS tablet Take 1 tablet (5 mg total) by mouth 2 (two) times daily. 180 tablet 3  . aspirin EC 81 MG tablet Take 81 mg by mouth daily.    Marland Kitchen atorvastatin (LIPITOR) 80 MG tablet Take 1 tablet (80 mg total) by  mouth daily. (Patient taking differently: Take 80 mg by mouth at bedtime. ) 90 tablet 3  . diltiazem (CARDIZEM) 30 MG tablet TAKE 1 TABLET EVERY 4 HOURS AS NEEDED FOR IRREGULAR HR>100 AND BP>100 45 tablet 2  . famotidine (PEPCID) 40 MG tablet Take 1 tablet (40 mg total) by mouth 2 (two) times daily. 180 tablet 1  . fish oil-omega-3 fatty acids 1000 MG capsule Take 1,000 mg by mouth daily.     . furosemide (LASIX) 20 MG  tablet Take 1 tablet (20 mg total) by mouth daily. 30 tablet 11  . irbesartan (AVAPRO) 300 MG tablet Take 1 tablet (300 mg total) by mouth daily. 90 tablet 2  . isosorbide mononitrate (IMDUR) 60 MG 24 hr tablet Take 1 tablet (60 mg total) by mouth daily. 90 tablet 2  . loratadine (CLARITIN) 10 MG tablet Take 10 mg by mouth daily as needed (seasonal allergies).     . Multiple Vitamin (MULTIVITAMIN WITH MINERALS) TABS tablet Take 1 tablet by mouth daily.    . nitroGLYCERIN (NITROSTAT) 0.4 MG SL tablet Place 1 tablet (0.4 mg total) under the tongue every 5 (five) minutes as needed for chest pain (CP or SOB). (Patient taking differently: Place 0.4 mg under the tongue every 5 (five) minutes as needed for chest pain (shortness of breath). ) 25 tablet 12  . potassium chloride (KLOR-CON M10) 10 MEQ tablet Take 1 tablet (10 mEq total) by mouth 2 (two) times daily. 180 tablet 3  . nicotine polacrilex (COMMIT) 2 MG lozenge Take 2 mg by mouth 3 (three) times daily as needed for smoking cessation.     No current facility-administered medications for this visit.     Allergies:   Lisinopril and Iohexol   Social History:  The patient  reports that he quit smoking about 17 months ago. His smoking use included Cigarettes. He has a 25.00 pack-year smoking history. He has never used smokeless tobacco. He reports that he drinks about 4.2 - 4.8 oz of alcohol per week . He reports that he does not use drugs.   Family History:  The patient's family history includes Diabetes in his sister; Hypertension in his mother and sister; Prostate cancer in his father.    ROS:  Please see the history of present illness.   Otherwise, review of systems is positive for Weight change, shortness of breath, palpitations, visual changes, cough.   All other systems are reviewed and negative.   PHYSICAL EXAM: VS:  BP (!) 142/72   Pulse (!) 53   Ht 6\' 2"  (1.88 m)   Wt 220 lb 3.2 oz (99.9 kg)   BMI 28.27 kg/m  , BMI Body mass index is  28.27 kg/m. GEN: Well nourished, well developed, in no acute distress  HEENT: normal  Neck: no JVD, carotid bruits, or masses Cardiac: RRR; no murmurs, rubs, or gallops,no edema  Respiratory:  clear to auscultation bilaterally, normal work of breathing GI: soft, nontender, nondistended, + BS MS: no deformity or atrophy  Skin: warm and dry Neuro:  Strength and sensation are intact Psych: euthymic mood, full affect  EKG:  EKG is ordered today. Personal review of the ekg ordered shows sinus rhythm, anterior T-wave inversions, rate 53   Recent Labs: 03/11/2017: B Natriuretic Peptide 61.5; TSH 1.456 03/12/2017: Magnesium 2.1 04/25/2017: BUN 12; Creatinine, Ser 1.20; Hemoglobin 13.7; Platelets 163; Potassium 4.6; Sodium 142    Lipid Panel     Component Value Date/Time   CHOL 125 02/21/2015 0555  TRIG 173 (H) 02/21/2015 0555   HDL 43 02/21/2015 0555   CHOLHDL 2.9 02/21/2015 0555   VLDL 35 02/21/2015 0555   LDLCALC 47 02/21/2015 0555     Wt Readings from Last 3 Encounters:  08/02/17 220 lb 3.2 oz (99.9 kg)  05/29/17 222 lb 3.2 oz (100.8 kg)  04/29/17 225 lb 15.5 oz (102.5 kg)      Other studies Reviewed: Additional studies/ records that were reviewed today include: TTE 04/19/16  Review of the above records today demonstrates:  - Left ventricle: The cavity size was mildly dilated. Wall   thickness was increased in a pattern of mild LVH. Systolic   function was mildly reduced. The estimated ejection fraction was   in the range of 45% to 50%. Diffuse hypokinesis. Doppler   parameters are consistent with abnormal left ventricular   relaxation (grade 1 diastolic dysfunction). - Aortic root: The aortic root was mildly dilated. - Left atrium: The atrium was mildly dilated. - Pulmonary arteries: Systolic pressure was mildly to moderately   increased. PA peak pressure: 44 mm Hg (S).   ASSESSMENT AND PLAN:  1.  Paroxysmal atrial fibrillation: On Eliquis and amiodarone S/p ablation  04/28/17. Remains in sinus rhythm today. Due to that, we'll plan to stop his amiodarone as he is also having fatigue. No further changes.  This patients CHA2DS2-VASc Score and unadjusted Ischemic Stroke Rate (% per year) is equal to 4.8 % stroke rate/year from a score of 4  Above score calculated as 1 point each if present [CHF, HTN, DM, Vascular=MI/PAD/Aortic Plaque, Age if 65-74, or Male] Above score calculated as 2 points each if present [Age > 75, or Stroke/TIA/TE]     2. Coronary artery disease: S/p PCI to ramus and Cx. No current chest pain. Continue current management.  3. Essential hypertension: Blood pressure mildly elevated today. No changes at this time.  4. Ischemic cardiomyopathy: Ejection fraction has improved to 45-50%. Continue with current management.     Current medicines are reviewed at length with the patient today.   The patient does not have concerns regarding his medicines.  The following changes were made today:  Stop amiodarone  Labs/ tests ordered today include:  No orders of the defined types were placed in this encounter.    Disposition:   FU with Krystin Keeven 3 months  Signed, Daeshawn Redmann Meredith Leeds, MD  08/02/2017 8:23 AM     [facility] [address] [place] [address] [phone] (office) [phone] (fax)

## 2017-08-02 NOTE — Addendum Note (Signed)
Addended by: Stanton Kidney on: 08/02/2017 09:03 AM   Modules accepted: Orders

## 2017-08-02 NOTE — Patient Instructions (Signed)
Medication Instructions:  Your physician has recommended you make the following change in your medication: 1. STOP Amiodarone  If you need a refill on your cardiac medications before your next appointment, please call your pharmacy.   Labwork: None ordered  Testing/Procedures: None ordered  Follow-Up: Your physician recommends that you schedule a follow-up appointment in: 3 months with Dr. [name].  Thank you for choosing [facility]!!   Trinidad Curet, RN [phone]  Any Other Special Instructions Will Be Listed Below (If Applicable).

## 2017-08-03 NOTE — Addendum Note (Signed)
Addendum  created 08/03/17 1446 by Roberts Gaudy, MD   Sign clinical note

## 2017-09-08 ENCOUNTER — Ambulatory Visit (INDEPENDENT_AMBULATORY_CARE_PROVIDER_SITE_OTHER): Payer: Medicare Other | Admitting: Family

## 2017-09-08 ENCOUNTER — Encounter: Payer: Self-pay | Admitting: Family

## 2017-09-08 ENCOUNTER — Ambulatory Visit (HOSPITAL_COMMUNITY)
Admission: RE | Admit: 2017-09-08 | Discharge: 2017-09-08 | Disposition: A | Discharge status: Home / Self Care | Payer: Medicare Other | Source: Ambulatory Visit | Attending: Family Medicine | Admitting: Family Medicine

## 2017-09-08 VITALS — BP 126/77 | HR 63 | Temp 97.0°F | Resp 16 | Ht 74.0 in | Wt 220.9 lb

## 2017-09-08 DIAGNOSIS — I714 Abdominal aortic aneurysm, without rupture, unspecified: Secondary | ICD-10-CM

## 2017-09-08 DIAGNOSIS — Z87891 Personal history of nicotine dependence: Secondary | ICD-10-CM | POA: Diagnosis not present

## 2017-09-08 DIAGNOSIS — Z720 Tobacco use: Secondary | ICD-10-CM

## 2017-09-08 DIAGNOSIS — Z95828 Presence of other vascular implants and grafts: Secondary | ICD-10-CM

## 2017-09-08 DIAGNOSIS — Z789 Other specified health status: Secondary | ICD-10-CM

## 2017-09-08 NOTE — Progress Notes (Signed)
[facility] [place]  CC: Follow up s/p Endovascular Repair of Abdominal Aortic Aneurysm    History of Present Illness  Rodney [name] is a 76 y.o. (04-18-1941) male patient of Dr. Kellie Simmering who is seen for continued follow-up regarding his abdominal aortic aneurysm stent graft repair in 2010. He denies any abdominal or back symptoms. He did have some cardiac stents placed in early 2016 by Dr. Claiborne Billings and is doing well from that standpoint. He is currently on aspirin and Eliquis, has a hx of paroxysmal atrial fib. He also takes a daily statin. Dr. Kellie Simmering last saw pt on 07/28/15. At that time duplex demonstarted contraction of extra stent aneurysmal sac and no evidence of endoleak.  He denies claudication symptoms with walking. He exercises in a gym 2x/week, plays golf twice/week. He denies any known history of stroke or TIA.   He states that his PCP at the [place] regularly checks ABI's and this has been normal.   He had a cardiac ablation in May 2018 for atrial fib, states he remains in SR, he was ale to get off Tikosyn. He remains on Eliquis and ASA.   Pt Diabetic: No Pt smoker: former smoker, quit in June 2018, started nicotine vapor product (started at age 43 yrs)   Past Medical History:  Diagnosis Date  . AAA (abdominal aortic aneurysm) ([place])    stent graft put in 02/2009  . Arthritis   . CAD (coronary artery disease)    a. cath 2010 b. CAD s/p PCI to ramus and LCX on 03/04/15  . Gout   . Hyperlipidemia   . Hypertension   . OSA (obstructive sleep apnea) 09/30/2015   Very mild with AHI 6.2/hr  . Paroxysmal atrial fibrillation (HCC)   . Tobacco abuse    Past Surgical History:  Procedure Laterality Date  . ABDOMINAL AORTIC ANEURYSM REPAIR  02/19/2009   performed by [facility]  . ATRIAL FIBRILLATION ABLATION N/A 04/28/2017   Procedure: Atrial Fibrillation Ablation;  Surgeon: Constance Haw, MD;  Location: [facility];  Service: Cardiovascular;  Laterality:  N/A;  . COLONOSCOPY    . KNEE ARTHROSCOPY  2003   left  . LEFT AND RIGHT HEART CATHETERIZATION WITH CORONARY ANGIOGRAM N/A 03/03/2015   Procedure: LEFT AND RIGHT HEART CATHETERIZATION WITH CORONARY ANGIOGRAM;  Surgeon: Troy Sine, MD;    . PERCUTANEOUS CORONARY STENT INTERVENTION (PCI-S) N/A 03/04/2015   Procedure: PERCUTANEOUS CORONARY STENT INTERVENTION (PCI-S);  Surgeon: Troy Sine, MD; RI 99>>0% w/   BMS, bifurcation CFX-OM 90>>40% w/ Angiosculpt PTCA; dCFX 90>>0% w/   2.7520 mm Rebel BMS  . SHOULDER ARTHROSCOPY WITH ROTATOR CUFF REPAIR AND SUBACROMIAL DECOMPRESSION Left 11/19/2013   Procedure: LEFT SHOULDER ARTHROSCOPY DEBRIDEMENT EXTENTSIVE DISTAL CLAVICULECTOMY DECOMPRESSION PARTIAL ACROMIOPLASTY WITH CORACOACROMIAL WITH ROTATOR CUFF REPAIR ;  Surgeon: Renette Butters, MD;  Location: [facility];  Service: Orthopedics;  Laterality: Left;   Social History Social History  Substance Use Topics  . Smoking status: Former Smoker    Packs/day: 0.50    Years: 50.00    Types: Cigarettes    Quit date: 03/01/2016  . Smokeless tobacco: Never Used  . Alcohol use 4.2 - 4.8 oz/week    6 Cans of beer, 1 - 2 Shots of liquor per week     Comment: couple shots of royal per day   Family History Family History  Problem Relation Age of Onset  . Prostate cancer Father   . Hypertension Mother   .  Diabetes Sister   . Hypertension Sister   . Stroke Neg Hx   . Heart attack Neg Hx    Current Outpatient Prescriptions on File Prior to Visit  Medication Sig Dispense Refill  . amLODipine (NORVASC) 10 MG tablet Take 1 tablet (10 mg total) by mouth daily. 90 tablet 3  . apixaban (ELIQUIS) 5 MG TABS tablet Take 1 tablet (5 mg total) by mouth 2 (two) times daily. 180 tablet 3  . aspirin EC 81 MG tablet Take 81 mg by mouth daily.    Marland Kitchen atorvastatin (LIPITOR) 80 MG tablet Take 1 tablet (80 mg total) by mouth daily. (Patient taking differently: Take 80 mg by mouth at bedtime. ) 90 tablet 3   . diltiazem (CARDIZEM) 30 MG tablet TAKE 1 TABLET EVERY 4 HOURS AS NEEDED FOR IRREGULAR HR>100 AND BP>100 45 tablet 2  . famotidine (PEPCID) 40 MG tablet Take 1 tablet (40 mg total) by mouth 2 (two) times daily. 180 tablet 1  . fish oil-omega-3 fatty acids 1000 MG capsule Take 1,000 mg by mouth daily.     . furosemide (LASIX) 20 MG tablet Take 1 tablet (20 mg total) by mouth daily. 30 tablet 11  . irbesartan (AVAPRO) 300 MG tablet Take 1 tablet (300 mg total) by mouth daily. 90 tablet 2  . isosorbide mononitrate (IMDUR) 60 MG 24 hr tablet Take 1 tablet (60 mg total) by mouth daily. 90 tablet 2  . loratadine (CLARITIN) 10 MG tablet Take 10 mg by mouth daily as needed (seasonal allergies).     . Multiple Vitamin (MULTIVITAMIN WITH MINERALS) TABS tablet Take 1 tablet by mouth daily.    . nitroGLYCERIN (NITROSTAT) 0.4 MG SL tablet Place 1 tablet (0.4 mg total) under the tongue every 5 (five) minutes as needed for chest pain (CP or SOB). (Patient taking differently: Place 0.4 mg under the tongue every 5 (five) minutes as needed for chest pain (shortness of breath). ) 25 tablet 12  . potassium chloride (KLOR-CON M10) 10 MEQ tablet Take 1 tablet (10 mEq total) by mouth 2 (two) times daily. 180 tablet 3   No current facility-administered medications on file prior to visit.    Allergies  Allergen Reactions  . Lisinopril Cough  . Iohexol Itching, Rash and Other (See Comments)     Code: RASH, Desc: PATIENT STATED THAT HE BEGAN ITCHING TOWARDS END OF INJECTION OF IV CONTRAST-- 13 HR PREP RECOMMENDED/MMS      ROS: See HPI for pertinent positives and negatives.  Physical Examination  Vitals:   09/08/17 0849 09/08/17 0859  BP: (!) 153/83 126/77  Pulse: 63   Resp: 16   Temp: (!) 97 F (36.1 C)   TempSrc: Oral   SpO2: 100%   Weight: 220 lb 14.4 oz (100.2 kg)   Height: 6\' 2"  (1.88 m)    Body mass index is 28.36 kg/m.  General: A&O x 3, WD  Pulmonary: Sym exp, respirations are non labored,  good air movt in all fields, CTAB  Cardiac: Regular rhythm and rate, no murmur appreciated  Vascular: Vessel Right Left  Radial 2+Palpable 2+Palpable  Carotid  without bruit  without bruit  Aorta Not palpable N/A  Femoral 2+Palpable 2+Palpable  Popliteal 2+palpable 2+ palpable  PT not Palpable not Palpable  DP not Palpable 1+Palpable   Gastrointestinal: soft, NTND, -G/R, - HSM, - palpable masses, - CVAT B.  Musculoskeletal: M/S 5/5 throughout, extremities without ischemic changes.  Skin: No rashes, no ulcers.   Neurologic: Pain  and light touch intact in extremities, Motor exam as listed above    Non-Invasive Vascular Imaging  EVAR Duplex (Date: 09-08-17)  AAA sac size: 3.2 cm x 3.4 cm; Right CIA: 1.9 cm; Left CIA: 1.5 cm  no endoleak detected     Previous: Date: 09/01/16) AA sac size: 3.4 cm x 3.3 cm   Medical Decision Making  Rodney [name] is a 76 y.o. male who presents s/p EVAR (Date: 2010).  Pt is asymptomatic with stable sac size.    I discussed with the patient the importance of surveillance of the endograft.  The next endograft duplex will be scheduled for18 months.  The patient will follow up with [place] in 18 months with these studies.  I emphasized the importance of maximal medical management including strict control of blood pressure, blood glucose, and lipid levels, antiplatelet agents, obtaining regular exercise, and cessation of smoking.   Thank you for allowing [place] to participate in this patient's care.  Clemon Chambers, RN, MSN, FNP-C [facility] [place] Office: [phone]  Clinic Physician: Chen/Cain  09/08/2017, 9:08 AM

## 2017-10-17 ENCOUNTER — Ambulatory Visit: Payer: Medicare Other | Admitting: Internal Medicine

## 2017-10-17 ENCOUNTER — Encounter: Payer: Self-pay | Admitting: Internal Medicine

## 2017-10-17 VITALS — BP 138/74 | HR 68 | Ht 74.0 in | Wt 224.0 lb

## 2017-10-17 DIAGNOSIS — I480 Paroxysmal atrial fibrillation: Secondary | ICD-10-CM

## 2017-10-17 DIAGNOSIS — I10 Essential (primary) hypertension: Secondary | ICD-10-CM | POA: Diagnosis not present

## 2017-10-17 DIAGNOSIS — I428 Other cardiomyopathies: Secondary | ICD-10-CM | POA: Diagnosis not present

## 2017-10-17 MED ORDER — AMLODIPINE BESYLATE 5 MG PO TABS: 5.0000 mg | ORAL_TABLET | Freq: Every day | ORAL | 3 refills | Status: AC

## 2017-10-17 MED ORDER — CARVEDILOL 3.125 MG PO TABS
3.1250 mg | ORAL_TABLET | Freq: Two times a day (BID) | ORAL | 3 refills | Status: DC
Start: 2017-10-17 — End: 2018-12-17

## 2017-10-17 NOTE — Patient Instructions (Signed)
Medication Instructions: Dr Debara Pickett has recommended making the following medication changes: 1. DECREASE Amlodipine to 5 mg daily 2. START Carvedilol 3.125 mg - take 1 tablet by mouth twice daily  Labwork: NONE ORDERED  Testing/Procedures: 1. Echocardiogram - Your physician has requested that you have an echocardiogram. Echocardiography is a painless test that uses sound waves to create images of your heart. It provides your doctor with information about the size and shape of your heart and how well your heart's chambers and valves are working. This procedure takes approximately one hour. There are no restrictions for this procedure. **This will be performed at our [facility] location - [address]  Follow-up: Dr Debara Pickett recommends that you schedule a follow-up appointment in 12 months. You will receive a reminder letter in the mail two months in advance. If you don't receive a letter, please call our office to schedule the follow-up appointment.  If you need a refill on your cardiac medications before your next appointment, please call your pharmacy.

## 2017-10-17 NOTE — Progress Notes (Signed)
OFFICE NOTE  Chief Complaint:  Occasional palpitation  Primary Care Physician: [name], MD  HPI:  Rodney [name] is a 76 y.o. male with a past medical history significant for CAD, hypertension, dyslipidemia, and AAA s/p stent-grafting in 2012 by Dr. Kellie Simmering. Preoperatively, he underwent a stress test which was abnormal, showing an inferior defect consistent with possible scar and mild to moderate LV dysfunction. He had a LHC, which revealed the following:  ANGIOGRAPHY: Left main: The left main is fairly normal.  The left anterior descending artery has some proximal luminal irregularities between 20 and 30%. The mid vessel stenosis around 30- 40%. It gives off several small diagonal arteries. The first diagonal artery is moderate in size and has mild irregularities. The second diagonal artery is very tiny and has an 80% proximal stenosis. This vessel is quite small and is not a candidate for PTCA.  The remaining LAD has only minor luminal irregularities.  The left circumflex artery is a large vessel. It gives off a very high obtuse marginal artery. The remaining circumflex artery continues around the A-V groove and supplies a posterolateral branch.  The first obtuse marginal artery is subtotally occluded at 2 different sites. There was sluggish TIMI grade 1 flow through this vessel. This vessel reaches around the lateral wall and supplies a small amount of the inferior left ventricle. This was quite likely where the site of the abnormal Cardiolite.  The right coronary artery is large and dominant. There are minor luminal irregularities. The posterior descending artery and the posterolateral segment artery, they taper fairly quickly at the takeoff. This taper improved slightly with intracoronary nitroglycerin. There are only minor luminal irregularities between 20 and 30%.  The left ventriculogram was performed in a 30-RAO  position. It reveals a moderately dilated and hypocontractile left ventricle. The ejection fraction is probably 40%.  During this left ventriculogram, we noticed that he had a very dilated aortic root. We performed an aortic root angiography, which revealed a dilated aortic root. There was no evidence of aortic insufficiency.  He now presents with accelerated hypertension and dyspnea, but no chest pain -troponin is borderline elevated at 0.05 and 0.07, BNP is elevated at 331. CXR demonstrates pulmonary edema. EKG shows sinus rhythm with LVH and lateral repolarization changes. Cardiology is asked to consult regarding management.  Rodney [name] was recently hospitalized and treated for heart failure. He started on diuretic and we discontinued his nifedipine and switched him to carvedilol. He recently followed up with his primary care provider who noted he was in new onset atrial fibrillation. It's feasible that atrial fibrillation was the precipitating factor for his heart failure. He seems to be unaware of his atrial fibrillation. A repeat EKG in our office today shows that he is converted back to sinus rhythm but he does have deep anterolateral and inferior T-wave inversions. These could be due to repolarization secondary to LVH however given his history of known coronary artery disease, could represent ischemia.  I saw Rodney [name] back in the office today. He underwent a nuclear stress test which was interpreted as follows:  Overall Impression: High risk stress nuclear study due to  severely decreased LV function (EF 28% - down from 445). There is a moderate inferior wall scar. No reversible ischemia is seen.. Consider multivessel CAD  versus adverse post-infarction remodeling as cause of worsening  Cardiomyopathy.  Rodney [name] returns today for follow-up. Recently he underwent cardiac catheterization and underwent stenting to the ramus and circumflex  as follows:  Difficult but successful  complex intervention involving PTCA/BM stenting of a subtotal ramus intermediate vessel with the entire proximal region being reduced to 0% in an intermediate vessel which extended to the LV apex once opened; and Angiosculpt/PTCA of bifurcation stenosis involving the distal circumflex flex marginal branch and Angiosculpt/BM stenting of a dominant distal circumflex extending into the PD/PLA vessel.  Subsequently he was noted to have increasing problems with palpitations and atrial fibrillation with rapid ventricular response. We elected to start him on dofetilide which he did well with however had some QT prolongation on the 500 g dose. Subsequently was placed on 250 g twice a day and is tolerating that well. Overall he feels well and has not had recurrence since discharge. He did feel subjective palpitations recently and went to the emergency room. He also was notably hypertensive. His rhythm was sinus. Blood pressure still is persistently elevated despite being on max dose irbesartan, carvedilol, Imdur and Lasix. He denies any chest pain. He successfully stop smoking and currently using nicotine patches but is having cravings.  Rodney [name] returns today for follow-up. Overall he seems to be doing fairly well. He exercises regularly and has gotten his weight down to his lowest wait which he has not seen a number of years. He reports very infrequent episodes of A. fib, maybe 2 or 3 over the past year which represents good control on Tikosyn. 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 This has not been assessed in the last year. He occasionally gets some bleeding of his gums which of encouraged him to follow-up with his dentist. He needs to stay on aspirin on a daily basis because of the coronary benefit. Recently he saw Roderic Palau, NP for ongoing follow-up of Tikosyn. His potassium was 3.9 and she encouraged him to take potassium supplements to try to get it above 4.0. Were requested a recheck  his potassium today.  04/24/2017  Rodney [name] returns today for hospital follow-up. He was hospitalized in April with recurrent A. fib. He was weaned off Tikosyn and started on amiodarone. He was seen by Dr. [name] for a-fib ablation. He reports having had a rash on 400 mg amiodarone but that resolved with decreasing the dose to 200 mg daily. He scheduled to see Dr. [name] tomorrow and have CT angiography with a planned A. fib ablation on Friday.  10/17/2017  Rodney [name] returns today for follow-up.  He underwent A. fib ablation with Dr. [name] and had been on amiodarone.  That was discontinued this past summer and he reports generally very few episodes of palpitations.  He also has a history of mixed ischemic and nonischemic cardiomyopathy with EF as low as 35% the recently came up to 45-50% as of May 2017.  In general he feels well, denying any chest pain or worsening shortness of breath.  He continues to play golf and can play 18 holes without stopping.  Review of his medications indicate that he is no longer on a beta-blocker, which was likely stopped when he went on to amiodarone.  PMHx:  Past Medical History:  Diagnosis Date  . AAA (abdominal aortic aneurysm) ([place])    stent graft put in 02/2009  . Arthritis   . CAD (coronary artery disease)    a. cath 2010 b. CAD s/p PCI to ramus and LCX on 03/04/15  . Gout   . Hyperlipidemia   . Hypertension   . OSA (obstructive sleep apnea) 09/30/2015   Very  mild with AHI 6.2/hr  . Paroxysmal atrial fibrillation (HCC)   . Tobacco abuse     Past Surgical History:  Procedure Laterality Date  . ABDOMINAL AORTIC ANEURYSM REPAIR  02/19/2009   performed by [facility]  . COLONOSCOPY    . KNEE ARTHROSCOPY  2003   left    FAMHx:  Family History  Problem Relation Age of Onset  . Prostate cancer Father   . Hypertension Mother   . Diabetes Sister   . Hypertension Sister   . Stroke Neg Hx   . Heart attack Neg Hx     SOCHx:   reports that he quit smoking  about 19 months ago. His smoking use included cigarettes. He has a 25.00 pack-year smoking history. he has never used smokeless tobacco. He reports that he drinks about 4.2 - 4.8 oz of alcohol per week. He reports that he does not use drugs.  ALLERGIES:  Allergies  Allergen Reactions  . Lisinopril Cough  . Iohexol Itching, Rash and Other (See Comments)     Code: RASH, Desc: PATIENT STATED THAT HE BEGAN ITCHING TOWARDS END OF INJECTION OF IV CONTRAST-- 13 HR PREP RECOMMENDED/MMS     ROS: Pertinent items noted in HPI and remainder of comprehensive ROS otherwise negative.  HOME MEDS: Current Outpatient Medications  Medication Sig Dispense Refill  . amLODipine (NORVASC) 10 MG tablet Take 1 tablet (10 mg total) by mouth daily. 90 tablet 3  . apixaban (ELIQUIS) 5 MG TABS tablet Take 1 tablet (5 mg total) by mouth 2 (two) times daily. 180 tablet 3  . aspirin EC 81 MG tablet Take 81 mg by mouth daily.    Marland Kitchen atorvastatin (LIPITOR) 80 MG tablet Take 1 tablet (80 mg total) by mouth daily. (Patient taking differently: Take 80 mg by mouth at bedtime. ) 90 tablet 3  . diltiazem (CARDIZEM) 30 MG tablet TAKE 1 TABLET EVERY 4 HOURS AS NEEDED FOR IRREGULAR HR>100 AND BP>100 45 tablet 2  . famotidine (PEPCID) 40 MG tablet Take 1 tablet (40 mg total) by mouth 2 (two) times daily. 180 tablet 1  . fish oil-omega-3 fatty acids 1000 MG capsule Take 1,000 mg by mouth daily.     . furosemide (LASIX) 20 MG tablet Take 1 tablet (20 mg total) by mouth daily. 30 tablet 11  . irbesartan (AVAPRO) 300 MG tablet Take 1 tablet (300 mg total) by mouth daily. 90 tablet 2  . isosorbide mononitrate (IMDUR) 60 MG 24 hr tablet Take 1 tablet (60 mg total) by mouth daily. 90 tablet 2  . loratadine (CLARITIN) 10 MG tablet Take 10 mg by mouth daily as needed (seasonal allergies).     . Multiple Vitamin (MULTIVITAMIN WITH MINERALS) TABS tablet Take 1 tablet by mouth daily.    . nitroGLYCERIN (NITROSTAT) 0.4 MG SL tablet Place 1  tablet (0.4 mg total) under the tongue every 5 (five) minutes as needed for chest pain (CP or SOB). (Patient taking differently: Place 0.4 mg under the tongue every 5 (five) minutes as needed for chest pain (shortness of breath). ) 25 tablet 12  . potassium chloride (KLOR-CON M10) 10 MEQ tablet Take 1 tablet (10 mEq total) by mouth 2 (two) times daily. 180 tablet 3   No current facility-administered medications for this visit.     LABS/IMAGING: No results found for this or any previous visit (from the past 48 hour(s)). No results found.  VITALS: Ht 6\' 2"  (1.88 m)   Wt 224 lb (101.6 kg)  BMI 28.76 kg/m   EXAM: General appearance: alert and no distress Neck: no carotid bruit, no JVD and thyroid not enlarged, symmetric, no tenderness/mass/nodules Lungs: clear to auscultation bilaterally Heart: regular rate and rhythm Abdomen: soft, non-tender; bowel sounds normal; no masses,  no organomegaly Extremities: extremities normal, atraumatic, no cyanosis or edema Pulses: 2+ and symmetric Skin: Skin color, texture, turgor normal. No rashes or lesions Neurologic: Grossly normal Psych: Pleasant  EKG: Deferred  ASSESSMENT: 1. CAD s/p PCI to the ramus intermedius and distal circumflex arteries (02/2015) 2. Ischemic cardiomyopathy EF 35-40%  - improved to 45-50% on 04/19/2016 3. History of systolic and diastolic congestive heart failure 4. Accelerated hypertension 5. History of abdominal aortic aneurysm repair 6. Paroxysmal atrial fibrillation - [facility]ASC 4, s/p ablation (2017)  PLAN: 1.   Rodney [name] seems to be doing well after his ablation.  He denies any worsening shortness of breath or chest pain.  He gets occasional palpitations.  We have not reassessed his EF by echo since May 2017.  He is on appropriate heart failure medication however not on a beta-blocker as he was on amiodarone.  I recommend starting carvedilol 3.125 mg twice daily in addition to his current medications and will  decrease his amlodipine to 5 mg daily.  This should also help with some of his breakthrough palpitations.  He should remain on Eliquis indefinitely.  He has a follow-up with his electrophysiologist next month.  Follow-up with me annually or sooner as necessary.  [name], MD, [facility] Attending Cardiologist [place] Merelyn Klump 10/17/2017, 8:08 AM

## 2017-10-30 ENCOUNTER — Other Ambulatory Visit: Payer: Self-pay

## 2017-10-30 ENCOUNTER — Ambulatory Visit (HOSPITAL_COMMUNITY): Payer: Medicare Other | Attending: Cardiology

## 2017-10-30 DIAGNOSIS — E785 Hyperlipidemia, unspecified: Secondary | ICD-10-CM | POA: Insufficient documentation

## 2017-10-30 DIAGNOSIS — I340 Nonrheumatic mitral (valve) insufficiency: Secondary | ICD-10-CM | POA: Insufficient documentation

## 2017-10-30 DIAGNOSIS — I4891 Unspecified atrial fibrillation: Secondary | ICD-10-CM | POA: Insufficient documentation

## 2017-10-30 DIAGNOSIS — I428 Other cardiomyopathies: Secondary | ICD-10-CM | POA: Diagnosis present

## 2017-10-30 DIAGNOSIS — G4733 Obstructive sleep apnea (adult) (pediatric): Secondary | ICD-10-CM | POA: Insufficient documentation

## 2017-10-30 DIAGNOSIS — F1010 Alcohol abuse, uncomplicated: Secondary | ICD-10-CM | POA: Insufficient documentation

## 2017-10-30 DIAGNOSIS — I77810 Thoracic aortic ectasia: Secondary | ICD-10-CM | POA: Insufficient documentation

## 2017-10-30 DIAGNOSIS — I119 Hypertensive heart disease without heart failure: Secondary | ICD-10-CM | POA: Diagnosis not present

## 2017-10-30 DIAGNOSIS — I2510 Atherosclerotic heart disease of native coronary artery without angina pectoris: Secondary | ICD-10-CM | POA: Insufficient documentation

## 2017-10-30 DIAGNOSIS — Z87891 Personal history of nicotine dependence: Secondary | ICD-10-CM | POA: Insufficient documentation

## 2017-11-07 ENCOUNTER — Encounter: Payer: Self-pay | Admitting: Cardiology

## 2017-11-07 ENCOUNTER — Ambulatory Visit: Payer: Medicare Other | Admitting: Cardiology

## 2017-11-07 VITALS — BP 142/80 | HR 62 | Ht 74.0 in | Wt 224.6 lb

## 2017-11-07 DIAGNOSIS — I10 Essential (primary) hypertension: Secondary | ICD-10-CM

## 2017-11-07 DIAGNOSIS — I255 Ischemic cardiomyopathy: Secondary | ICD-10-CM

## 2017-11-07 DIAGNOSIS — I480 Paroxysmal atrial fibrillation: Secondary | ICD-10-CM | POA: Diagnosis not present

## 2017-11-07 DIAGNOSIS — I2510 Atherosclerotic heart disease of native coronary artery without angina pectoris: Secondary | ICD-10-CM

## 2017-11-07 MED ORDER — NITROGLYCERIN 0.4 MG SL SUBL: 0.4000 mg | SUBLINGUAL_TABLET | SUBLINGUAL | 2 refills | Status: DC | PRN

## 2017-11-07 NOTE — Patient Instructions (Signed)
Medication Instructions:  Your physician recommends that you continue on your current medications as directed. Please refer to the Current Medication list given to you today.  If you need a refill on your cardiac medications before your next appointment, please call your pharmacy.   Labwork: None ordered  Testing/Procedures: None ordered  Follow-Up: Your physician wants you to follow-up in: 6 months  with Dr. Camnitz.  You will receive a reminder letter in the mail two months in advance. If you don't receive a letter, please call our office to schedule the follow-up appointment.  Thank you for choosing [facility]!!   Madeleyn Schwimmer, RN [phone]  Any Other Special Instructions Will Be Listed Below (If Applicable).        

## 2017-11-07 NOTE — Progress Notes (Signed)
Electrophysiology Office Note   Date:  11/07/2017   ID:  Rodney [name], DOB Nov 17, 1941, MRN [id number]  PCP:  Rodney Dy, MD  Cardiologist:  Rodney [name] Primary Electrophysiologist:  Rodney [name] Meredith Leeds, MD    Chief Complaint  Patient presents with  . Follow-up    PAF     History of Present Illness: Rodney [name] is a 76 y.o. male who is being seen today for the evaluation of atrial fibrillation at the request of Rodney Dy, MD. Presenting today for electrophysiology evaluation. He has a history of AAA, coronary artery disease status post stent to the ramus and circumflex, hypertension, hyperlipidemia, OSA, and paroxysmal atrial fibrillation. He was hospitalized for atrial fibrillation at the beginning of April. He had previously been on dofetilide, which was stopped. Had AF ablation 04/28/17.  Today, denies symptoms of palpitations, chest pain, shortness of breath, orthopnea, PND, lower extremity edema, claudication, dizziness, presyncope, syncope, bleeding, or neurologic sequela. The patient is tolerating medications without difficulties.  He had one atrial fibrillation episode last night that lasted 15 minutes.  Otherwise he has felt well without major complaint.   Past Medical History:  Diagnosis Date  . AAA (abdominal aortic aneurysm) ([place])    stent graft put in 02/2009  . Arthritis   . CAD (coronary artery disease)    a. cath 2010 b. CAD s/p PCI to ramus and LCX on 03/04/15  . Gout   . Hyperlipidemia   . Hypertension   . OSA (obstructive sleep apnea) 09/30/2015   Very mild with AHI 6.2/hr  . Paroxysmal atrial fibrillation (HCC)   . Tobacco abuse    Past Surgical History:  Procedure Laterality Date  . ABDOMINAL AORTIC ANEURYSM REPAIR  02/19/2009   performed by [facility]  . ATRIAL FIBRILLATION ABLATION N/A 04/28/2017   Procedure: Atrial Fibrillation Ablation;  Surgeon: Constance Haw, MD;  Location: [facility];  Service: Cardiovascular;  Laterality: N/A;  .  COLONOSCOPY    . KNEE ARTHROSCOPY  2003   left  . LEFT AND RIGHT HEART CATHETERIZATION WITH CORONARY ANGIOGRAM N/A 03/03/2015   Procedure: LEFT AND RIGHT HEART CATHETERIZATION WITH CORONARY ANGIOGRAM;  Surgeon: Troy Sine, MD;    . PERCUTANEOUS CORONARY STENT INTERVENTION (PCI-S) N/A 03/04/2015   Procedure: PERCUTANEOUS CORONARY STENT INTERVENTION (PCI-S);  Surgeon: Troy Sine, MD; RI 99>>0% w/   BMS, bifurcation CFX-OM 90>>40% w/ Angiosculpt PTCA; dCFX 90>>0% w/   2.7520 mm Rebel BMS  . SHOULDER ARTHROSCOPY WITH ROTATOR CUFF REPAIR AND SUBACROMIAL DECOMPRESSION Left 11/19/2013   Procedure: LEFT SHOULDER ARTHROSCOPY DEBRIDEMENT EXTENTSIVE DISTAL CLAVICULECTOMY DECOMPRESSION PARTIAL ACROMIOPLASTY WITH CORACOACROMIAL WITH ROTATOR CUFF REPAIR ;  Surgeon: Renette Butters, MD;  Location: [facility];  Service: Orthopedics;  Laterality: Left;     Current Outpatient Medications  Medication Sig Dispense Refill  . amLODipine (NORVASC) 5 MG tablet Take 1 tablet (5 mg total) daily by mouth. 90 tablet 3  . apixaban (ELIQUIS) 5 MG TABS tablet Take 1 tablet (5 mg total) by mouth 2 (two) times daily. 180 tablet 3  . aspirin EC 81 MG tablet Take 81 mg by mouth daily.    Marland Kitchen atorvastatin (LIPITOR) 80 MG tablet Take 1 tablet (80 mg total) by mouth daily. (Patient taking differently: Take 80 mg by mouth at bedtime. ) 90 tablet 3  . carvedilol (COREG) 3.125 MG tablet Take 1 tablet (3.125 mg total) 2 (two) times daily by mouth. 180 tablet 3  . diltiazem (CARDIZEM) 30  MG tablet TAKE 1 TABLET EVERY 4 HOURS AS NEEDED FOR IRREGULAR HR>100 AND BP>100 45 tablet 2  . famotidine (PEPCID) 40 MG tablet Take 1 tablet (40 mg total) by mouth 2 (two) times daily. 180 tablet 1  . fish oil-omega-3 fatty acids 1000 MG capsule Take 1,000 mg by mouth daily.     . furosemide (LASIX) 20 MG tablet Take 1 tablet (20 mg total) by mouth daily. 30 tablet 11  . irbesartan (AVAPRO) 300 MG tablet Take 1 tablet (300 mg  total) by mouth daily. 90 tablet 2  . isosorbide mononitrate (IMDUR) 60 MG 24 hr tablet Take 1 tablet (60 mg total) by mouth daily. 90 tablet 2  . loratadine (CLARITIN) 10 MG tablet Take 10 mg by mouth daily as needed (seasonal allergies).     . Multiple Vitamin (MULTIVITAMIN WITH MINERALS) TABS tablet Take 1 tablet by mouth daily.    . nitroGLYCERIN (NITROSTAT) 0.4 MG SL tablet Place 1 tablet (0.4 mg total) under the tongue every 5 (five) minutes as needed for chest pain (shortness of breath). 30 tablet 2  . potassium chloride (KLOR-CON M10) 10 MEQ tablet Take 1 tablet (10 mEq total) by mouth 2 (two) times daily. 180 tablet 3   No current facility-administered medications for this visit.     Allergies:   Lisinopril and Iohexol   Social History:  The patient  reports that he quit smoking about 20 months ago. His smoking use included cigarettes. He has a 25.00 pack-year smoking history. he has never used smokeless tobacco. He reports that he drinks about 4.2 - 4.8 oz of alcohol per week. He reports that he does not use drugs.   Family History:  The patient's family history includes Diabetes in his sister; Hypertension in his mother and sister; Prostate cancer in his father.   ROS:  Please see the history of present illness.   Otherwise, review of systems is positive for palpitations, shortness of breath, cough, snoring, visual disturbance.   All other systems are reviewed and negative.   PHYSICAL EXAM: VS:  BP (!) 142/80   Pulse 62   Ht 6\' 2"  (1.88 m)   Wt 224 lb 9.6 oz (101.9 kg)   BMI 28.84 kg/m  , BMI Body mass index is 28.84 kg/m. GEN: Well nourished, well developed, in no acute distress  HEENT: normal  Neck: no JVD, carotid bruits, or masses Cardiac: RRR; no murmurs, rubs, or gallops,no edema  Respiratory:  clear to auscultation bilaterally, normal work of breathing GI: soft, nontender, nondistended, + BS MS: no deformity or atrophy  Skin: warm and dry Neuro:  Strength and  sensation are intact Psych: euthymic mood, full affect  EKG:  EKG is not ordered today. Personal review of the ekg ordered 08/02/17 shows sinus rhythm, rate 53, anterior T wave changes  Recent Labs: 03/11/2017: B Natriuretic Peptide 61.5; TSH 1.456 03/12/2017: Magnesium 2.1 04/25/2017: BUN 12; Creatinine, Ser 1.20; Hemoglobin 13.7; Platelets 163; Potassium 4.6; Sodium 142    Lipid Panel     Component Value Date/Time   CHOL 125 02/21/2015 0555   TRIG 173 (H) 02/21/2015 0555   HDL 43 02/21/2015 0555   CHOLHDL 2.9 02/21/2015 0555   VLDL 35 02/21/2015 0555   LDLCALC 47 02/21/2015 0555     Wt Readings from Last 3 Encounters:  11/07/17 224 lb 9.6 oz (101.9 kg)  10/17/17 224 lb (101.6 kg)  09/08/17 220 lb 14.4 oz (100.2 kg)      Other studies  Reviewed: Additional studies/ records that were reviewed today include: TTE 10/30/17 Review of the above records today demonstrates:  - Left ventricle: The cavity size was normal. Wall thickness was   normal. Systolic function was normal. The estimated ejection   fraction was in the range of 50% to 55%. Wall motion was normal;   there were no regional wall motion abnormalities. Doppler   parameters are consistent with abnormal left ventricular   relaxation (grade 1 diastolic dysfunction). - Aorta: Ascending aortic diameter: 45 mm (S). - Ascending aorta: The ascending aorta was moderately dilated. - Mitral valve: There was mild regurgitation. - Left atrium: The atrium was mildly dilated.    ASSESSMENT AND PLAN:  1.  Paroxysmal atrial fibrillation: Currently on Eliquis.  Status post ablation 04/28/17.  Amiodarone stopped at his last visit due to fatigue.  He remains in sinus rhythm today.  He does feel that he was in atrial fibrillation last night for approximately 15 minutes, but otherwise has had no further episodes.  No changes at this time.    This patients CHA2DS2-VASc Score and unadjusted Ischemic Stroke Rate (% per year) is equal  to 4.8 % stroke rate/year from a score of 4  Above score calculated as 1 point each if present [CHF, HTN, DM, Vascular=MI/PAD/Aortic Plaque, Age if 65-74, or Male] Above score calculated as 2 points each if present [Age > 75, or Stroke/TIA/TE]  2. Coronary artery disease: Status post PCI to the circumflex.  No current chest pain.  Continue current management.  3. Essential hypertension: Blood pressure mildly elevated today.  Has recently had adjustments to his carvedilol and amlodipine.  He may require 10 mg of amlodipine in the future.  He Carmon Brigandi check his blood pressure at home and call us if adjustments need to be made.  4. Ischemic cardiomyopathy: Ejection fraction 45-50%.  Continue current management.     Current medicines are reviewed at length with the patient today.   The patient does not have concerns regarding his medicines.  The following changes were made today:  none  Labs/ tests ordered today include:  No orders of the defined types were placed in this encounter.    Disposition:   FU with Finneas Mathe 6 months  Signed, Phung Kotas Meredith Leeds, MD  11/07/2017 9:00 AM     [facility] [address] [place] [address] [phone] (office) [phone] (fax)

## 2017-11-21 ENCOUNTER — Other Ambulatory Visit: Payer: Self-pay | Admitting: Internal Medicine

## 2017-11-22 NOTE — Telephone Encounter (Signed)
Rx has been sent to the pharmacy electronically. ° °

## 2017-11-23 ENCOUNTER — Other Ambulatory Visit (HOSPITAL_COMMUNITY): Payer: Self-pay | Admitting: Nurse Practitioner

## 2017-11-23 NOTE — Telephone Encounter (Signed)
This is Dr. Hilty's pt 

## 2018-03-14 ENCOUNTER — Other Ambulatory Visit: Payer: Self-pay | Admitting: Internal Medicine

## 2018-05-23 ENCOUNTER — Other Ambulatory Visit: Payer: Self-pay

## 2018-05-29 ENCOUNTER — Telehealth: Payer: Self-pay | Admitting: Internal Medicine

## 2018-05-29 NOTE — Telephone Encounter (Signed)
Received fax from [facility] that irbesartan is not available. Called pharmacy to confirm this and 150mg  and 300mg  are on backorder. Valsartan is available as a possible change.   Routed to MD

## 2018-05-29 NOTE — Telephone Encounter (Signed)
Ok .. Would switch to valsartan 320 mg daily.  Dr. Lemmie Evens

## 2018-05-31 MED ORDER — VALSARTAN 320 MG PO TABS: 320.0000 mg | ORAL_TABLET | Freq: Every day | ORAL | 1 refills | Status: DC

## 2018-05-31 NOTE — Telephone Encounter (Signed)
Left detailed message for patient with info about med change. Rx(s) sent to pharmacy electronically.

## 2018-06-07 ENCOUNTER — Telehealth: Payer: Self-pay | Admitting: Internal Medicine

## 2018-06-07 NOTE — Telephone Encounter (Signed)
Spoke with pt and since starting the Valsartan has not felt well and for about 1 1/2 hours after taking is unable to function has given med almost a week to adjust but no change b/p 126/76 but sometimes this is higher and heart rate is 71 .Will forward to Dr Debara Pickett for review .Adonis Housekeeper

## 2018-06-07 NOTE — Telephone Encounter (Signed)
Hmm .. Looks like BP normal if not higher from what you are saying - so, I suspect he is not feeling bad from low blood pressure. We could try a different ARB medication or maybe split the dose into am and pm? What would he prefer to try?  Dr. Lemmie Evens

## 2018-06-07 NOTE — Telephone Encounter (Signed)
New Message   Pt c/o medication issue:  1. Name of Medication: valsartan (DIOVAN) 320 MG tablet  2. How are you currently taking this medication (dosage and times per day)? Take 1 tablet (320 mg total) by mouth daily  3. Are you having a reaction (difficulty breathing--STAT)? yes  4. What is your medication issue? Pt states that Hilty changed his medication to the Valsartan and he has been in Afib since he started taking it. Please call

## 2018-06-08 MED ORDER — IRBESARTAN 300 MG PO TABS: 300.0000 mg | ORAL_TABLET | Freq: Every day | ORAL | 3 refills | Status: DC

## 2018-06-08 NOTE — Telephone Encounter (Addendum)
Spoke with patient who would like to try another ARB - his previous irbesartan. He would like printed Rx to take to [place] on Monday to see if they have in stock. He will call back with update on this situation so our records can be updated.   Printed, signed by Merita Norton PA and left for patient to pick up

## 2018-07-22 ENCOUNTER — Emergency Department (HOSPITAL_COMMUNITY): Payer: Medicare Other

## 2018-07-22 ENCOUNTER — Encounter (HOSPITAL_COMMUNITY): Payer: Self-pay | Admitting: Emergency Medicine

## 2018-07-22 ENCOUNTER — Other Ambulatory Visit: Payer: Self-pay

## 2018-07-22 ENCOUNTER — Observation Stay (HOSPITAL_COMMUNITY)
Admission: EM | Admit: 2018-07-22 | Discharge: 2018-07-23 | Disposition: A | Discharge status: Home / Self Care | Payer: Medicare Other | Attending: Internal Medicine | Admitting: Internal Medicine

## 2018-07-22 DIAGNOSIS — Z955 Presence of coronary angioplasty implant and graft: Secondary | ICD-10-CM | POA: Diagnosis not present

## 2018-07-22 DIAGNOSIS — F17210 Nicotine dependence, cigarettes, uncomplicated: Secondary | ICD-10-CM | POA: Insufficient documentation

## 2018-07-22 DIAGNOSIS — I480 Paroxysmal atrial fibrillation: Secondary | ICD-10-CM | POA: Diagnosis not present

## 2018-07-22 DIAGNOSIS — R0600 Dyspnea, unspecified: Secondary | ICD-10-CM | POA: Diagnosis present

## 2018-07-22 DIAGNOSIS — I248 Other forms of acute ischemic heart disease: Secondary | ICD-10-CM | POA: Diagnosis present

## 2018-07-22 DIAGNOSIS — Z7951 Long term (current) use of inhaled steroids: Secondary | ICD-10-CM | POA: Diagnosis not present

## 2018-07-22 DIAGNOSIS — R0602 Shortness of breath: Secondary | ICD-10-CM

## 2018-07-22 DIAGNOSIS — I200 Unstable angina: Secondary | ICD-10-CM | POA: Diagnosis not present

## 2018-07-22 DIAGNOSIS — I110 Hypertensive heart disease with heart failure: Secondary | ICD-10-CM | POA: Insufficient documentation

## 2018-07-22 DIAGNOSIS — M1990 Unspecified osteoarthritis, unspecified site: Secondary | ICD-10-CM | POA: Insufficient documentation

## 2018-07-22 DIAGNOSIS — R079 Chest pain, unspecified: Principal | ICD-10-CM | POA: Insufficient documentation

## 2018-07-22 DIAGNOSIS — I2510 Atherosclerotic heart disease of native coronary artery without angina pectoris: Secondary | ICD-10-CM | POA: Diagnosis not present

## 2018-07-22 DIAGNOSIS — E785 Hyperlipidemia, unspecified: Secondary | ICD-10-CM | POA: Diagnosis not present

## 2018-07-22 DIAGNOSIS — Z95828 Presence of other vascular implants and grafts: Secondary | ICD-10-CM

## 2018-07-22 DIAGNOSIS — I10 Essential (primary) hypertension: Secondary | ICD-10-CM | POA: Diagnosis present

## 2018-07-22 DIAGNOSIS — I428 Other cardiomyopathies: Secondary | ICD-10-CM | POA: Diagnosis not present

## 2018-07-22 DIAGNOSIS — D696 Thrombocytopenia, unspecified: Secondary | ICD-10-CM | POA: Diagnosis not present

## 2018-07-22 DIAGNOSIS — R9431 Abnormal electrocardiogram [ECG] [EKG]: Secondary | ICD-10-CM

## 2018-07-22 DIAGNOSIS — Z7901 Long term (current) use of anticoagulants: Secondary | ICD-10-CM | POA: Insufficient documentation

## 2018-07-22 DIAGNOSIS — Z720 Tobacco use: Secondary | ICD-10-CM | POA: Diagnosis present

## 2018-07-22 DIAGNOSIS — Z8673 Personal history of transient ischemic attack (TIA), and cerebral infarction without residual deficits: Secondary | ICD-10-CM | POA: Diagnosis not present

## 2018-07-22 DIAGNOSIS — M109 Gout, unspecified: Secondary | ICD-10-CM | POA: Diagnosis not present

## 2018-07-22 DIAGNOSIS — G4733 Obstructive sleep apnea (adult) (pediatric): Secondary | ICD-10-CM | POA: Diagnosis not present

## 2018-07-22 DIAGNOSIS — I5042 Chronic combined systolic (congestive) and diastolic (congestive) heart failure: Secondary | ICD-10-CM | POA: Diagnosis not present

## 2018-07-22 LAB — CBC
HCT: 43.8 % (ref 39.0–52.0)
HEMOGLOBIN: 13.9 g/dL (ref 13.0–17.0)
MCH: 29.4 pg (ref 26.0–34.0)
MCHC: 31.7 g/dL (ref 30.0–36.0)
MCV: 92.6 fL (ref 78.0–100.0)
Platelets: 133 10*3/uL — ABNORMAL LOW (ref 150–400)
RBC: 4.73 MIL/uL (ref 4.22–5.81)
RDW: 13.4 % (ref 11.5–15.5)
WBC: 3.1 10*3/uL — AB (ref 4.0–10.5)

## 2018-07-22 LAB — BASIC METABOLIC PANEL
ANION GAP: 7 (ref 5–15)
BUN: 10 mg/dL (ref 8–23)
CALCIUM: 8.9 mg/dL (ref 8.9–10.3)
CO2: 23 mmol/L (ref 22–32)
CREATININE: 1.27 mg/dL — AB (ref 0.61–1.24)
Chloride: 109 mmol/L (ref 98–111)
GFR, EST NON AFRICAN AMERICAN: 53 mL/min — AB (ref >60)
Glucose, Bld: 114 mg/dL — ABNORMAL HIGH (ref 70–99)
Potassium: 4.1 mmol/L (ref 3.5–5.1)
SODIUM: 139 mmol/L (ref 135–145)

## 2018-07-22 LAB — I-STAT TROPONIN, ED: TROPONIN I, POC: 0 ng/mL (ref 0.00–0.08)

## 2018-07-22 LAB — BRAIN NATRIURETIC PEPTIDE: B Natriuretic Peptide: 60.5 pg/mL (ref 0.0–100.0)

## 2018-07-22 LAB — TROPONIN I: Troponin I: <0.03 ng/mL (ref <0.03)

## 2018-07-22 MED ORDER — OMEGA-3-ACID ETHYL ESTERS 1 G PO CAPS
1.0000 g | ORAL_CAPSULE | Freq: Every day | ORAL | Status: DC
  Administered 2018-07-23: 1 g via ORAL
  Filled 2018-07-22: qty 1

## 2018-07-22 MED ORDER — FUROSEMIDE 20 MG PO TABS
20.0000 mg | ORAL_TABLET | Freq: Every day | ORAL | Status: DC
  Administered 2018-07-23: 20 mg via ORAL
  Filled 2018-07-22: qty 1

## 2018-07-22 MED ORDER — ISOSORBIDE MONONITRATE ER 60 MG PO TB24
60.0000 mg | ORAL_TABLET | Freq: Every day | ORAL | Status: DC
  Administered 2018-07-23: 60 mg via ORAL
  Filled 2018-07-22: qty 1

## 2018-07-22 MED ORDER — POTASSIUM CHLORIDE CRYS ER 10 MEQ PO TBCR
10.0000 meq | EXTENDED_RELEASE_TABLET | Freq: Two times a day (BID) | ORAL | Status: DC
  Administered 2018-07-22 – 2018-07-23 (×2): 10 meq via ORAL
  Filled 2018-07-22 (×2): qty 1

## 2018-07-22 MED ORDER — ACETAMINOPHEN 325 MG PO TABS: 650.0000 mg | ORAL_TABLET | ORAL | Status: DC | PRN

## 2018-07-22 MED ORDER — AMLODIPINE BESYLATE 5 MG PO TABS
5.0000 mg | ORAL_TABLET | Freq: Every day | ORAL | Status: DC
  Administered 2018-07-23: 5 mg via ORAL
  Filled 2018-07-22: qty 1

## 2018-07-22 MED ORDER — FAMOTIDINE 20 MG PO TABS
40.0000 mg | ORAL_TABLET | Freq: Two times a day (BID) | ORAL | Status: DC
  Administered 2018-07-22 – 2018-07-23 (×2): 40 mg via ORAL
  Filled 2018-07-22 (×2): qty 2

## 2018-07-22 MED ORDER — APIXABAN 5 MG PO TABS
5.0000 mg | ORAL_TABLET | Freq: Two times a day (BID) | ORAL | Status: DC
  Administered 2018-07-22 – 2018-07-23 (×2): 5 mg via ORAL
  Filled 2018-07-22 (×3): qty 1

## 2018-07-22 MED ORDER — GI COCKTAIL ~~LOC~~
30.0000 mL | Freq: Four times a day (QID) | ORAL | Status: DC | PRN
Start: 2018-07-22 — End: 2018-07-23

## 2018-07-22 MED ORDER — CARVEDILOL 3.125 MG PO TABS
3.1250 mg | ORAL_TABLET | Freq: Two times a day (BID) | ORAL | Status: DC
  Administered 2018-07-22 – 2018-07-23 (×2): 3.125 mg via ORAL
  Filled 2018-07-22 (×2): qty 1

## 2018-07-22 MED ORDER — SODIUM CHLORIDE 0.9 % IV SOLN
INTRAVENOUS | Status: DC
  Administered 2018-07-22: 16:00:00 via INTRAVENOUS

## 2018-07-22 MED ORDER — OMEGA-3 FATTY ACIDS 1000 MG PO CAPS: 1000.0000 mg | ORAL_CAPSULE | Freq: Every day | ORAL | Status: DC

## 2018-07-22 MED ORDER — ALPRAZOLAM 0.25 MG PO TABS: 0.2500 mg | ORAL_TABLET | Freq: Two times a day (BID) | ORAL | Status: DC | PRN

## 2018-07-22 MED ORDER — ATORVASTATIN CALCIUM 80 MG PO TABS
80.0000 mg | ORAL_TABLET | Freq: Every day | ORAL | Status: DC
  Administered 2018-07-22: 80 mg via ORAL
  Filled 2018-07-22: qty 1

## 2018-07-22 MED ORDER — CARVEDILOL 3.125 MG PO TABS: 3.1250 mg | ORAL_TABLET | Freq: Two times a day (BID) | ORAL | Status: DC

## 2018-07-22 MED ORDER — ONDANSETRON HCL 4 MG/2ML IJ SOLN: 4.0000 mg | Freq: Four times a day (QID) | INTRAMUSCULAR | Status: DC | PRN

## 2018-07-22 MED ORDER — NITROGLYCERIN 0.4 MG SL SUBL: 0.4000 mg | SUBLINGUAL_TABLET | SUBLINGUAL | Status: DC | PRN

## 2018-07-22 MED ORDER — ASPIRIN EC 81 MG PO TBEC
81.0000 mg | DELAYED_RELEASE_TABLET | Freq: Every day | ORAL | Status: DC
  Administered 2018-07-23: 81 mg via ORAL
  Filled 2018-07-22: qty 1

## 2018-07-22 MED ORDER — IRBESARTAN 300 MG PO TABS
300.0000 mg | ORAL_TABLET | Freq: Every day | ORAL | Status: DC
  Administered 2018-07-23: 300 mg via ORAL
  Filled 2018-07-22: qty 1

## 2018-07-22 MED ORDER — MORPHINE SULFATE (PF) 2 MG/ML IV SOLN: 1.0000 mg | INTRAVENOUS | Status: DC | PRN

## 2018-07-22 NOTE — ED Triage Notes (Signed)
Pt reports intermittent shortness of breath for several weeks. No swelling. Endorses discomfort to the chest that is mild.

## 2018-07-22 NOTE — ED Provider Notes (Signed)
[facility] EMERGENCY DEPARTMENT Provider Note   CSN: [id number] Arrival date & time: 07/22/18  0846     History   Chief Complaint Chief Complaint  Patient presents with  . Shortness of Breath    HPI Rodney [name] is a 77 y.o. male.  HPI Patient presents to the emergency department with intermittent shortness of breath and chest discomfort that started several weeks ago.  The patient states that this feels similar to when he had to have stents placed in the past.  Patient states that nothing seems to make the condition better or worse.  He states there is not necessarily an exertional component to this.  Patient states that the chest discomfort was not constant and did not happen on a regular basis.  The patient denies  headache,blurred vision, neck pain, fever, cough, weakness, numbness, dizziness, anorexia, edema, abdominal pain, nausea, vomiting, diarrhea, rash, back pain, dysuria, hematemesis, bloody stool, near syncope, or syncope. Past Medical History:  Diagnosis Date  . AAA (abdominal aortic aneurysm) ([place])    stent graft put in 02/2009  . Arthritis   . CAD (coronary artery disease)    a. cath 2010 b. CAD s/p PCI to ramus and LCX on 03/04/15  . Gout   . Hyperlipidemia   . Hypertension   . OSA (obstructive sleep apnea) 09/30/2015   Very mild with AHI 6.2/hr  . Paroxysmal atrial fibrillation (HCC)   . Tobacco abuse     Patient Active Problem List   Diagnosis Date Noted  . Nonischemic cardiomyopathy ([place]) 10/17/2017  . AF (atrial fibrillation) ([place]) 04/28/2017  . Premature ventricular contractions (PVCs) (VPCs) 03/11/2017  . H/O endovascular stent graft for abdominal aortic aneurysm 09/01/2016  . Cardiomyopathy, ischemic 03/30/2016  . OSA (obstructive sleep apnea) 09/30/2015  . Encounter for monitoring anti-arrhythmic therapy 05/04/2015  . DOE (dyspnea on exertion) 03/16/2015  . Atrial fibrillation with RVR ([place]) 03/16/2015  . Hypertensive urgency  03/08/2015  . Coronary artery disease due to lipid rich plaque   . AA (aortic aneurysm) ([place])   . Dyspnea 03/02/2015  . Progressive angina ([place]) 03/02/2015  . History of TIA (transient ischemic attack), 02/20/15 03/02/2015  . Mitral regurgitation 03/02/2015  . HX: anticoagulation 03/02/2015  . Chest tightness   . Tobacco abuse   . HLD (hyperlipidemia)   . TIA (transient ischemic attack) 02/20/2015  . 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 Chronic combined systolic and diastolic heart failure, NYHA class 1 ([place]) 02/20/2015  . PAF (paroxysmal atrial fibrillation) ([place]) 01/29/2015  . Accelerated hypertension, hx of 01/03/2015  . Acute diastolic CHF (congestive heart failure), NYHA class 2 ([place]) 01/03/2015  . Demand ischemia ([place]) 01/03/2015  . CAD (coronary artery disease) stent place 03/04/15 01/03/2015  . Troponin level elevated   . Thrombocytopenia ([place])   . Essential hypertension   . S/P abdominal aortic aneurysm repair 07/03/2012    Past Surgical History:  Procedure Laterality Date  . ABDOMINAL AORTIC ANEURYSM REPAIR  02/19/2009   performed by [facility]  . ATRIAL FIBRILLATION ABLATION N/A 04/28/2017   Procedure: Atrial Fibrillation Ablation;  Surgeon: Constance Haw, MD;  Location: [facility];  Service: Cardiovascular;  Laterality: N/A;  . COLONOSCOPY    . KNEE ARTHROSCOPY  2003   left  . LEFT AND RIGHT HEART CATHETERIZATION WITH CORONARY ANGIOGRAM N/A 03/03/2015   Procedure: LEFT AND RIGHT HEART CATHETERIZATION WITH CORONARY ANGIOGRAM;  Surgeon: Troy Sine, MD;    . PERCUTANEOUS CORONARY STENT INTERVENTION (PCI-S) N/A 03/04/2015  Procedure: PERCUTANEOUS CORONARY STENT INTERVENTION (PCI-S);  Surgeon: Troy Sine, MD; RI 99>>0% w/   BMS, bifurcation CFX-OM 90>>40% w/ Angiosculpt PTCA; dCFX 90>>0% w/   2.7520 mm Rebel BMS  . SHOULDER ARTHROSCOPY WITH ROTATOR CUFF REPAIR AND SUBACROMIAL DECOMPRESSION Left 11/19/2013   Procedure: LEFT SHOULDER ARTHROSCOPY DEBRIDEMENT EXTENTSIVE  DISTAL CLAVICULECTOMY DECOMPRESSION PARTIAL ACROMIOPLASTY WITH CORACOACROMIAL WITH ROTATOR CUFF REPAIR ;  Surgeon: Renette Butters, MD;  Location: [facility];  Service: Orthopedics;  Laterality: Left;        Home Medications    Prior to Admission medications   Medication Sig Start Date End Date Taking? Authorizing Provider  amLODipine (NORVASC) 5 MG tablet Take 1 tablet (5 mg total) daily by mouth. 10/17/17  Yes Hilty, Nadean Corwin, MD  apixaban (ELIQUIS) 5 MG TABS tablet Take 1 tablet (5 mg total) by mouth 2 (two) times daily. 05/26/15  Yes [name], MD  aspirin EC 81 MG tablet Take 81 mg by mouth daily.   Yes [provider]  atorvastatin (LIPITOR) 80 MG tablet Take 1 tablet (80 mg total) by mouth daily. Patient taking differently: Take 80 mg by mouth at bedtime.  05/21/15  Yes Hilty, Nadean Corwin, MD  carvedilol (COREG) 3.125 MG tablet Take 1 tablet (3.125 mg total) 2 (two) times daily by mouth. 10/17/17 07/22/18 Yes Hilty, Nadean Corwin, MD  diltiazem (CARDIZEM) 30 MG tablet TAKE 1 TABLET EVERY 4 HOURS AS NEEDED FOR IRREGULAR HR>100 AND BP>100 01/02/17  Yes Sherran Needs, NP  famotidine (PEPCID) 40 MG tablet Take 1 tablet (40 mg total) by mouth 2 (two) times daily. 10/19/15  Yes Hilty, Nadean Corwin, MD  fish oil-omega-3 fatty acids 1000 MG capsule Take 1,000 mg by mouth daily.    Yes [provider]  furosemide (LASIX) 20 MG tablet Take 1 tablet (20 mg total) by mouth daily. 03/30/16  Yes Hilty, Nadean Corwin, MD  irbesartan (AVAPRO) 300 MG tablet Take 1 tablet (300 mg total) by mouth daily. 06/08/18  Yes Barrett, Evelene Croon, PA-C  isosorbide mononitrate (IMDUR) 60 MG 24 hr tablet TAKE 1 TABLET BY MOUTH EVERY DAY ( CAN NOT BE FILL 06.04.2018) Patient taking differently: Take 60 mg by mouth daily.  03/14/18  Yes Hilty, Nadean Corwin, MD  KLOR-CON M10 10 MEQ tablet TAKE 1 TABLET BY MOUTH TWICE A DAY 11/23/17  Yes Hilty, Nadean Corwin, MD  loratadine (CLARITIN) 10 MG tablet Take 10 mg  by mouth daily as needed (seasonal allergies).    Yes [provider]  Multiple Vitamin (MULTIVITAMIN WITH MINERALS) TABS tablet Take 1 tablet by mouth daily.   Yes [provider]  nitroGLYCERIN (NITROSTAT) 0.4 MG SL tablet Place 1 tablet (0.4 mg total) under the tongue every 5 (five) minutes as needed for chest pain (shortness of breath). 11/07/17  Yes Camnitz, Ocie Doyne, MD    Family History Family History  Problem Relation Age of Onset  . Prostate cancer Father   . Hypertension Mother   . Diabetes Sister   . Hypertension Sister   . Stroke Neg Hx   . Heart attack Neg Hx     Social History Social History   Tobacco Use  . Smoking status: Former Smoker    Packs/day: 0.50    Years: 50.00    Pack years: 25.00    Types: Cigarettes    Last attempt to quit: 03/01/2016    Years since quitting: 2.3  . Smokeless tobacco: Never Used  Substance Use  Topics  . Alcohol use: Yes    Alcohol/week: 7.0 - 8.0 standard drinks    Types: 6 Cans of beer, 1 - 2 Shots of liquor per week    Comment: couple shots of royal per day  . Drug use: No     Allergies   Lisinopril and Iohexol   Review of Systems Review of Systems All other systems negative except as documented in the HPI. All pertinent positives and negatives as reviewed in the HPI.  Physical Exam Updated Vital Signs BP (!) 151/81   Pulse (!) 58   Temp 97.8 F (36.6 C)   Resp 15   Ht 6\' 2"  (1.88 m)   Wt 98 kg   SpO2 98%   BMI 27.73 kg/m   Physical Exam  Constitutional: He is oriented to person, place, and time. He appears well-developed and well-nourished. No distress.  HENT:  Head: Normocephalic and atraumatic.  Mouth/Throat: Oropharynx is clear and moist.  Eyes: Pupils are equal, round, and reactive to light.  Neck: Normal range of motion. Neck supple.  Cardiovascular: Normal rate, regular rhythm and normal heart sounds. Exam reveals no gallop and no friction rub.  No murmur  heard. Pulmonary/Chest: Effort normal and breath sounds normal. No respiratory distress. He has no decreased breath sounds. He has no wheezes.  Abdominal: Soft. Bowel sounds are normal. He exhibits no distension. There is no tenderness.  Neurological: He is alert and oriented to person, place, and time. He exhibits normal muscle tone. Coordination normal.  Skin: Skin is warm and dry. Capillary refill takes less than 2 seconds. No rash noted. No erythema.  Psychiatric: He has a normal mood and affect. His behavior is normal.  Nursing note and vitals reviewed.    ED Treatments / Results  Labs (all labs ordered are listed, but only abnormal results are displayed) Labs Reviewed  BASIC METABOLIC PANEL - Abnormal; Notable for the following components:      Result Value   Glucose, Bld 114 (*)    Creatinine, Ser 1.27 (*)    GFR calc non Af Amer 53 (*)    All other components within normal limits  CBC - Abnormal; Notable for the following components:   WBC 3.1 (*)    Platelets 133 (*)    All other components within normal limits  BRAIN NATRIURETIC PEPTIDE  I-STAT TROPONIN, ED    EKG EKG Interpretation  Date/Time:  Sunday July 22 2018 08:54:29 EDT Ventricular Rate:  55 PR Interval:  188 QRS Duration: 92 QT Interval:  462 QTC Calculation: 441 R Axis:   18 Text Interpretation:  Sinus bradycardia ST & T wave abnormality, consider inferolateral ischemia Abnormal ECG Since last tracing T wave inversion new in the inferior leads Confirmed by Noemi Chapel [phone]) on 07/22/2018 9:42:05 AM   Radiology Dg Chest 2 View  Result Date: 07/22/2018 CLINICAL DATA:  Shortness of breath. EXAM: CHEST - 2 VIEW COMPARISON:  03/20/2017 FINDINGS: The heart size and mediastinal contours are within normal limits. Stable mild scarring/atelectasis at both lung bases. There is no evidence of pulmonary edema, consolidation, pneumothorax, nodule or pleural fluid. The visualized skeletal structures are  unremarkable. IMPRESSION: No acute findings.  Stable scarring/atelectasis in both lungs. Electronically Signed   By: Aletta Edouard M.D.   On: 07/22/2018 09:22    Procedures Procedures (including critical care time)  Medications Ordered in ED Medications - No data to display   Initial Impression / Assessment and Plan / ED Course  I  have reviewed the triage vital signs and the nursing notes.  Pertinent labs & imaging results that were available during my care of the patient were reviewed by me and considered in my medical decision making (see chart for details).     I spoke with the [facility] about admission for the patient for chest pain rule out.  I did speak with cardiology.  There is a 2-1/2-hour delay in the return phone call.  I initially was seeking them for admission due to the fact that the patient has a close relationship with their group and the fact that this seems like a cardiac issue.  The patient is advised the plan and all questions were answered.  Final Clinical Impressions(s) / ED Diagnoses   Final diagnoses:  Shortness of breath    ED Discharge Orders    None       Dalia Heading, PA-C 07/22/18 1432    Noemi Chapel, MD 07/22/18 [phone]

## 2018-07-22 NOTE — Progress Notes (Signed)
Report received on patient from ED.

## 2018-07-22 NOTE — Progress Notes (Signed)
Patient arrives to [facility], c/a/ox4 ambulates from stretcher to bed using cane.

## 2018-07-22 NOTE — H&P (Signed)
History and Physical    JARMAL LEWELLING WUJ:[id number] DOB: [birth year] DOA: 07/22/2018   PCP: [name], MD   Patient coming from:  Home    Chief Complaint: Chest pain and shortness of breath   HPI: EMARI DEMMER is a 77 y.o. male with medical history significant for CAD status post PCI in 2016, heat history of stenting of the AAA, OSA, paroxysmal atrial fibrillation on Eliquis,, presenting to the emergency department with intermittent shortness of breath and chest discomfort for at least 2 weeks.  He states that this feels similar to when he had stents placed in the past.  He denies any symptoms to be on exertion, mostly they are at rest.  "They come and go ".  He describes it as 6 out of 10.  He also reports that is not happening on a regular basis.  He denies any fever, cough, dizziness, vertigo, syncope or presyncope.  He denies any nausea or vomiting.  He denies any headache or vision changes.  He denies any neck or ear pain.  He denies any abdominal pain, diarrhea, or rashes.  He denies any dysuria or gross hematuria.  He denies any other areas of bleeding.  The patient smokes about half a pack of cigarettes a day.  He denies any alcohol.  He does consume occasionally marijuana, last 2 weeks ago.  No recent long distance trips.  He is not on any hormonal supplements.  He is fairly active.  No confusion is reported.   ED Course:  BP (!) 144/72   Pulse (!) 52   Temp 97.8 F (36.6 C)   Resp 19   Ht 6\' 2"  (1.88 m)   Wt 98 kg   SpO2 96%   BMI 27.73 kg/m    Last 2D echo 1 year ago showed normal systolic, EF 50 to 95%, normal wall motion.  He is grade 1 diastolic dysfunction Glucose 114 Creatinine 1.27 White count 3.1 Platelets 133, he has chronic thrombocytopenia BNP is normal EKG shows sinus bradycardia, with ST and T wave abnormality, suspicious for inferolateral ischemia, since last tracing these changes are visible. Cardiology evaluation was contacted, and they are to  consult.   n.Systems:  As per HPI otherwise all other systems reviewed and are negative  Past Medical History:  Diagnosis Date  . AAA (abdominal aortic aneurysm) ([place])    stent graft put in 02/2009  . Arthritis   . CAD (coronary artery disease)    a. cath 2010 b. CAD s/p PCI to ramus and LCX on 03/04/15  . Gout   . Hyperlipidemia   . Hypertension   . OSA (obstructive sleep apnea) 09/30/2015   Very mild with AHI 6.2/hr  . Paroxysmal atrial fibrillation (HCC)   . Tobacco abuse     Past Surgical History:  Procedure Laterality Date  . ABDOMINAL AORTIC ANEURYSM REPAIR  02/19/2009   performed by [facility]  . ATRIAL FIBRILLATION ABLATION N/A 04/28/2017   Procedure: Atrial Fibrillation Ablation;  Surgeon: Constance Haw, MD;  Location: [facility];  Service: Cardiovascular;  Laterality: N/A;  . COLONOSCOPY    . KNEE ARTHROSCOPY  2003   left  . LEFT AND RIGHT HEART CATHETERIZATION WITH CORONARY ANGIOGRAM N/A 03/03/2015   Procedure: LEFT AND RIGHT HEART CATHETERIZATION WITH CORONARY ANGIOGRAM;  Surgeon: Troy Sine, MD;    . PERCUTANEOUS CORONARY STENT INTERVENTION (PCI-S) N/A 03/04/2015   Procedure: PERCUTANEOUS CORONARY STENT INTERVENTION (PCI-S);  Surgeon: Troy Sine, MD; RI  99>>0% w/   BMS, bifurcation CFX-OM 90>>40% w/ Angiosculpt PTCA; dCFX 90>>0% w/   2.7520 mm Rebel BMS  . SHOULDER ARTHROSCOPY WITH ROTATOR CUFF REPAIR AND SUBACROMIAL DECOMPRESSION Left 11/19/2013   Procedure: LEFT SHOULDER ARTHROSCOPY DEBRIDEMENT EXTENTSIVE DISTAL CLAVICULECTOMY DECOMPRESSION PARTIAL ACROMIOPLASTY WITH CORACOACROMIAL WITH ROTATOR CUFF REPAIR ;  Surgeon: Renette Butters, MD;  Location: [facility];  Service: Orthopedics;  Laterality: Left;    Social History Social History   Socioeconomic History  . Marital status: Married    Spouse name: Not on file  . Number of children: Not on file  . Years of education: Not on file  . Highest education level: Not on file    Occupational History  . Not on file  Social Needs  . Financial resource strain: Not on file  . Food insecurity:    Worry: Not on file    Inability: Not on file  . Transportation needs:    Medical: Not on file    Non-medical: Not on file  Tobacco Use  . Smoking status: Former Smoker    Packs/day: 0.50    Years: 50.00    Pack years: 25.00    Types: Cigarettes    Last attempt to quit: 03/01/2016    Years since quitting: 2.3  . Smokeless tobacco: Never Used  Substance and Sexual Activity  . Alcohol use: Yes    Alcohol/week: 7.0 - 8.0 standard drinks    Types: 6 Cans of beer, 1 - 2 Shots of liquor per week    Comment: couple shots of royal per day  . Drug use: No  . Sexual activity: Not Currently    Comment: cutting down 1/2 ppd  Lifestyle  . Physical activity:    Days per week: Not on file    Minutes per session: Not on file  . Stress: Not on file  Relationships  . Social connections:    Talks on phone: Not on file    Gets together: Not on file    Attends religious service: Not on file    Active member of club or organization: Not on file    Attends meetings of clubs or organizations: Not on file    Relationship status: Not on file  . Intimate partner violence:    Fear of current or ex partner: Not on file    Emotionally abused: Not on file    Physically abused: Not on file    Forced sexual activity: Not on file  Other Topics Concern  . Not on file  Social History Narrative   Lives with wife, does not use assist, drives.  No home health services.       Allergies  Allergen Reactions  . Lisinopril Cough  . Iohexol Itching, Rash and Other (See Comments)     Code: RASH, Desc: PATIENT STATED THAT HE BEGAN ITCHING TOWARDS END OF INJECTION OF IV CONTRAST-- 13 HR PREP RECOMMENDED/MMS     Family History  Problem Relation Age of Onset  . Prostate cancer Father   . Hypertension Mother   . Diabetes Sister   . Hypertension Sister   . Stroke Neg Hx   . Heart attack  Neg Hx        Prior to Admission medications   Medication Sig Start Date End Date Taking? Authorizing Provider  amLODipine (NORVASC) 5 MG tablet Take 1 tablet (5 mg total) daily by mouth. 10/17/17  Yes Hilty, Nadean Corwin, MD  apixaban (ELIQUIS) 5 MG TABS tablet Take  1 tablet (5 mg total) by mouth 2 (two) times daily. 05/26/15  Yes [name], MD  aspirin EC 81 MG tablet Take 81 mg by mouth daily.   Yes [provider]  atorvastatin (LIPITOR) 80 MG tablet Take 1 tablet (80 mg total) by mouth daily. Patient taking differently: Take 80 mg by mouth at bedtime.  05/21/15  Yes Hilty, Nadean Corwin, MD  carvedilol (COREG) 3.125 MG tablet Take 1 tablet (3.125 mg total) 2 (two) times daily by mouth. 10/17/17 07/22/18 Yes Hilty, Nadean Corwin, MD  diltiazem (CARDIZEM) 30 MG tablet TAKE 1 TABLET EVERY 4 HOURS AS NEEDED FOR IRREGULAR HR>100 AND BP>100 01/02/17  Yes Sherran Needs, NP  famotidine (PEPCID) 40 MG tablet Take 1 tablet (40 mg total) by mouth 2 (two) times daily. 10/19/15  Yes Hilty, Nadean Corwin, MD  fish oil-omega-3 fatty acids 1000 MG capsule Take 1,000 mg by mouth daily.    Yes [provider]  furosemide (LASIX) 20 MG tablet Take 1 tablet (20 mg total) by mouth daily. 03/30/16  Yes Hilty, Nadean Corwin, MD  irbesartan (AVAPRO) 300 MG tablet Take 1 tablet (300 mg total) by mouth daily. 06/08/18  Yes Barrett, Evelene Croon, PA-C  isosorbide mononitrate (IMDUR) 60 MG 24 hr tablet TAKE 1 TABLET BY MOUTH EVERY DAY ( CAN NOT BE FILL 06.04.2018) Patient taking differently: Take 60 mg by mouth daily.  03/14/18  Yes Hilty, Nadean Corwin, MD  KLOR-CON M10 10 MEQ tablet TAKE 1 TABLET BY MOUTH TWICE A DAY 11/23/17  Yes Hilty, Nadean Corwin, MD  loratadine (CLARITIN) 10 MG tablet Take 10 mg by mouth daily as needed (seasonal allergies).    Yes [provider]  Multiple Vitamin (MULTIVITAMIN WITH MINERALS) TABS tablet Take 1 tablet by mouth daily.   Yes [provider]  nitroGLYCERIN (NITROSTAT) 0.4 MG  SL tablet Place 1 tablet (0.4 mg total) under the tongue every 5 (five) minutes as needed for chest pain (shortness of breath). 11/07/17  Yes Constance Haw, MD     Physical Exam:  Vitals:   07/22/18 1130 07/22/18 1145 07/22/18 1215 07/22/18 1222  BP: (!) 145/77 (!) 144/72    Pulse: (!) 55 (!) 51 (!) 51 (!) 52  Resp: (!) 23 13 15 19   Temp:      SpO2: 97% 96% 99% 96%  Weight:      Height:       Constitutional: NAD, calm, comfortable while lying down  eyes: PERRL, lids and conjunctivae normal ENMT: Mucous membranes are moist, without exudate or lesions  Neck: normal, supple, no masses, no thyromegaly Respiratory: clear to auscultation bilaterally, no wheezing, no crackles. Normal respiratory effort  Cardiovascular: Regular rate and rhythm, occasional ectopic beats, 1 out of 6 murmur, rubs or gallops. No extremity edema. 2+ pedal pulses. No carotid bruits.  Abdomen: Soft, non tender, No hepatosplenomegaly. Bowel sounds positive.  Musculoskeletal: no clubbing / cyanosis. Moves all extremities Skin: no jaundice, No lesions.  Neurologic: Sensation intact  Strength equal in all extremities Psychiatric:   Alert and oriented x 3. Normal mood.     Labs on Admission: I have personally reviewed following labs and imaging studies  CBC: Recent Labs  Lab 07/22/18 0905  WBC 3.1*  HGB 13.9  HCT 43.8  MCV 92.6  PLT 133*    Basic Metabolic Panel: Recent Labs  Lab 07/22/18 0905  NA 139  K 4.1  CL 109  CO2 23  GLUCOSE 114*  BUN 10  CREATININE 1.27*  CALCIUM 8.9    GFR: Estimated Creatinine Clearance: 57.5 mL/min (A) (by C-G formula based on SCr of 1.27 mg/dL (H)).  Liver Function Tests: No results for input(s): AST, ALT, ALKPHOS, BILITOT, PROT, ALBUMIN in the last 168 hours. No results for input(s): LIPASE, AMYLASE in the last 168 hours. No results for input(s): AMMONIA in the last 168 hours.  Coagulation Profile: No results for input(s): INR, PROTIME in the last 168  hours.  Cardiac Enzymes: No results for input(s): CKTOTAL, CKMB, CKMBINDEX, TROPONINI in the last 168 hours.  BNP (last 3 results) No results for input(s): PROBNP in the last 8760 hours.  HbA1C: No results for input(s): HGBA1C in the last 72 hours.  CBG: No results for input(s): GLUCAP in the last 168 hours.  Lipid Profile: No results for input(s): CHOL, HDL, LDLCALC, TRIG, CHOLHDL, LDLDIRECT in the last 72 hours.  Thyroid Function Tests: No results for input(s): TSH, T4TOTAL, FREET4, T3FREE, THYROIDAB in the last 72 hours.  Anemia Panel: No results for input(s): VITAMINB12, FOLATE, FERRITIN, TIBC, IRON, RETICCTPCT in the last 72 hours.  Urine analysis:    Component Value Date/Time   COLORURINE YELLOW 03/11/2017 2105   APPEARANCEUR CLEAR 03/11/2017 2105   LABSPEC 1.009 03/11/2017 2105   PHURINE 5.0 03/11/2017 2105   GLUCOSEU NEGATIVE 03/11/2017 2105   HGBUR SMALL (A) 03/11/2017 2105   BILIRUBINUR NEGATIVE 03/11/2017 2105   KETONESUR NEGATIVE 03/11/2017 2105   PROTEINUR NEGATIVE 03/11/2017 2105   UROBILINOGEN 0.2 03/16/2015 1225   NITRITE NEGATIVE 03/11/2017 2105   LEUKOCYTESUR NEGATIVE 03/11/2017 2105    Sepsis Labs: @LABRCNTIP (procalcitonin:4,lacticidven:4) )No results found for this or any previous visit (from the past 240 hour(s)).   Radiological Exams on Admission: Dg Chest 2 View  Result Date: 07/22/2018 CLINICAL DATA:  Shortness of breath. EXAM: CHEST - 2 VIEW COMPARISON:  03/20/2017 FINDINGS: The heart size and mediastinal contours are within normal limits. Stable mild scarring/atelectasis at both lung bases. There is no evidence of pulmonary edema, consolidation, pneumothorax, nodule or pleural fluid. The visualized skeletal structures are unremarkable. IMPRESSION: No acute findings.  Stable scarring/atelectasis in both lungs. Electronically Signed   By: Aletta Edouard M.D.   On: 07/22/2018 09:22    EKG: Independently reviewed.  Assessment/Plan Principal  Problem:   Chest pain Active Problems:   Abnormal EKG   Thrombocytopenia (HCC)   Essential hypertension   Demand ischemia (HCC)   CAD (coronary artery disease) stent place 03/04/15   Chronic combined systolic and diastolic heart failure, NYHA class 1 (HCC)   Tobacco abuse   HLD (hyperlipidemia)   Dyspnea   History of TIA (transient ischemic attack), 02/20/15   H/O endovascular stent graft for abdominal aortic aneurysm   Nonischemic cardiomyopathy (HCC)   Chest pain syndrome/known CAD heart score is 5, troponin 0, EKG with sinus bradycardia, with ST and T wave abnormality, suspicious for inferolateral ischemia, since last tracing these changes are visible.  Last catheterization in 2016, with stents.  Cardiology evaluation was contacted, and they are to consult.  Chest x-ray without acute findings.  Last 2D echo1 year ago showed normal systolic, EF 50 to 16%, normal wall motion.  He is grade 1 diastolic dysfunction.  BMP is normal.  Admit to Telemetry/ Observation Chest pain order set Cycle troponins EKG in am continue ASA, O2 and NTG as needed Continue preadmission beta blocker and nitrate Statins  GI cocktail Check Lipid panel  Hb A1C 2 D echo Consult to Cards pending  Atrial  Fibrillation on anticoagulation with Eliquis  Continue home meds including Eliquis    Hypertension BP (!) 144/72   Pulse (!) 52   Temp 97.8 F (36.6 C)   Resp 19   Ht 6\' 2"  (1.88 m)   Wt 98 kg   SpO2 96%   BMI 27.73 kg/m  Controlled Continue home anti-hypertensive medications  Add Hydralazine Q6 hours as needed for BP 160/90    Hyperlipidemia Continue home statins  History of thrombocytopenia, Count todayis 133,000, no active bleed. No intervention is indicated at this time CBC in a.m.  Tobacco abuse    Nicotine patch vs gum  Counseled cessation  .  DVT prophylaxis:  SCD for now Code Status:     Family Communication:  Discussed with patient Disposition Plan: Expect patient to  be discharged to home after condition improves Consults called:     Admission status:    Sharene Butters, PA-C [facility]   Amion text  [phone]   07/22/2018, 2:28 PM

## 2018-07-22 NOTE — ED Provider Notes (Signed)
Medical screening examination/treatment/procedure(s) were conducted as a shared visit with non-physician practitioner(s) and myself.  I personally evaluated the patient during the encounter.  Clinical Impression:   Final diagnoses:  None    The patient is a 77 year old male, known cardiac disease, followed both by Dr. Debara Pickett with cardiology as well as by the [facility].  He states that he has had some exertional chest pain described as heaviness as well as shortness of breath recently, has become much worse.  On exam has clear heart and lung sounds, slight murmur, no edema.  EKG abnormal with new T wave abnormalities, will need to be admitted to the hospital for cardiac evaluation.   Noemi Chapel, MD 07/22/18 [phone]

## 2018-07-23 ENCOUNTER — Other Ambulatory Visit: Payer: Self-pay | Admitting: Physician Assistant

## 2018-07-23 ENCOUNTER — Ambulatory Visit (HOSPITAL_COMMUNITY): Payer: Medicare Other

## 2018-07-23 DIAGNOSIS — I110 Hypertensive heart disease with heart failure: Secondary | ICD-10-CM | POA: Diagnosis not present

## 2018-07-23 DIAGNOSIS — I2510 Atherosclerotic heart disease of native coronary artery without angina pectoris: Secondary | ICD-10-CM

## 2018-07-23 DIAGNOSIS — I10 Essential (primary) hypertension: Secondary | ICD-10-CM

## 2018-07-23 DIAGNOSIS — I5042 Chronic combined systolic (congestive) and diastolic (congestive) heart failure: Secondary | ICD-10-CM | POA: Diagnosis not present

## 2018-07-23 DIAGNOSIS — I200 Unstable angina: Secondary | ICD-10-CM

## 2018-07-23 DIAGNOSIS — D696 Thrombocytopenia, unspecified: Secondary | ICD-10-CM | POA: Diagnosis not present

## 2018-07-23 DIAGNOSIS — E785 Hyperlipidemia, unspecified: Secondary | ICD-10-CM | POA: Diagnosis not present

## 2018-07-23 DIAGNOSIS — R079 Chest pain, unspecified: Secondary | ICD-10-CM | POA: Diagnosis not present

## 2018-07-23 LAB — LIPID PANEL
CHOL/HDL RATIO: 3.5 ratio
Cholesterol: 115 mg/dL (ref 0–200)
HDL: 33 mg/dL — AB (ref >40)
LDL Cholesterol: 61 mg/dL (ref 0–99)
TRIGLYCERIDES: 103 mg/dL (ref <150)
VLDL: 21 mg/dL (ref 0–40)

## 2018-07-23 LAB — TROPONIN I

## 2018-07-23 MED ORDER — CAMPHOR-MENTHOL 0.5-0.5 % EX LOTN
TOPICAL_LOTION | CUTANEOUS | Status: DC | PRN
  Administered 2018-07-23: 14:00:00 via TOPICAL
  Filled 2018-07-23: qty 222

## 2018-07-23 NOTE — Progress Notes (Signed)
Pt wife concerned for pt foot rash, stated she forgot to tell MD  Informed MD  Educated pt on using lotion for dryness  Pt wife stated that will not work  MD to order sorenol

## 2018-07-23 NOTE — Progress Notes (Signed)
Switched pt bed per family request

## 2018-07-23 NOTE — Discharge Summary (Signed)
Physician Discharge Summary  Rodney [name] [id number]:[id number] DOB: [birth year] DOA: 07/22/2018  PCP: [name], MD  Admit date: 07/22/2018 Discharge date: 07/23/2018  Admitted From: home Discharge disposition: home   Recommendations for Outpatient Follow-Up:   1. Outpatient stress test with cardiology follow up 2. Topical treatment of fungal infection on feet- follow for improvement 3. Smoking cessation   Discharge Diagnosis:   Principal Problem:   Chest pain Active Problems:   Thrombocytopenia ([place])   Essential hypertension   Demand ischemia (HCC)   CAD (coronary artery disease) stent place 03/04/15   Chronic combined systolic and diastolic heart failure, NYHA class 1 (HCC)   Tobacco abuse   HLD (hyperlipidemia)   Dyspnea   History of TIA (transient ischemic attack), 02/20/15   H/O endovascular stent graft for abdominal aortic aneurysm   Nonischemic cardiomyopathy (HCC)   Abnormal EKG    Discharge Condition: Improved.  Diet recommendation: Low sodium, heart healthy.  Wound care: None.  Code status: Full.   History of Present Illness:  77 year-old male with complicated cardiac history including stents and abdominal aortic aneurysm repair along with atrial fibrillation and hypertension presents to the emergency department with chest pain.  He continues to use tobacco.  Placed in observation and ruled out for myocardial infarction.  Given his complicated history cardiology has been consulted.    Hospital Course by Problem:   Chest pain syndrome/known CAD  -CE negative -seen by cardiology -to be set up for outpatient stress test  Atrial Fibrillation on anticoagulation with Eliquis  Continue home meds including Eliquis    Hypertension  Continue home anti-hypertensive medications    Hyperlipidemia Continue home statins  History of thrombocytopenia -stable  Tobacco abuse  Encouraged cessation   Medical Consultants:    cards   Discharge Exam:   Vitals:   07/23/18 0907 07/23/18 1148  BP: (!) 147/77 131/79  Pulse: (!) 57 (!) 52  Resp:  18  Temp:    SpO2:  97%   Vitals:   07/22/18 1953 07/23/18 0411 07/23/18 0907 07/23/18 1148  BP: (!) 158/71 124/68 (!) 147/77 131/79  Pulse: (!) 56 64 (!) 57 (!) 52  Resp: 18 18  18   Temp: (!) 97.3 F (36.3 C) 98.3 F (36.8 C)    TempSrc: Oral Oral    SpO2: 98% 98%  97%  Weight:      Height:        General exam: Appears calm and comfortable.  Peeling rash on feet with darkened toe nails   The results of significant diagnostics from this hospitalization (including imaging, microbiology, ancillary and laboratory) are listed below for reference.     Procedures and Diagnostic Studies:   Dg Chest 2 View  Result Date: 07/22/2018 CLINICAL DATA:  Shortness of breath. EXAM: CHEST - 2 VIEW COMPARISON:  03/20/2017 FINDINGS: The heart size and mediastinal contours are within normal limits. Stable mild scarring/atelectasis at both lung bases. There is no evidence of pulmonary edema, consolidation, pneumothorax, nodule or pleural fluid. The visualized skeletal structures are unremarkable. IMPRESSION: No acute findings.  Stable scarring/atelectasis in both lungs. Electronically Signed   By: Aletta Edouard M.D.   On: 07/22/2018 09:22     Labs:   Basic Metabolic Panel: Recent Labs  Lab 07/22/18 0905  NA 139  K 4.1  CL 109  CO2 23  GLUCOSE 114*  BUN 10  CREATININE 1.27*  CALCIUM 8.9   GFR Estimated Creatinine Clearance: 57.5 mL/min (A) (by  C-G formula based on SCr of 1.27 mg/dL (H)). Liver Function Tests: No results for input(s): AST, ALT, ALKPHOS, BILITOT, PROT, ALBUMIN in the last 168 hours. No results for input(s): LIPASE, AMYLASE in the last 168 hours. No results for input(s): AMMONIA in the last 168 hours. Coagulation profile No results for input(s): INR, PROTIME in the last 168 hours.  CBC: Recent Labs  Lab 07/22/18 0905  WBC 3.1*  HGB  13.9  HCT 43.8  MCV 92.6  PLT 133*   Cardiac Enzymes: Recent Labs  Lab 07/22/18 1532 07/22/18 1805 07/23/18 0008  TROPONINI <0.03 <0.03 <0.03   BNP: Invalid input(s): POCBNP CBG: No results for input(s): GLUCAP in the last 168 hours. D-Dimer No results for input(s): DDIMER in the last 72 hours. Hgb A1c No results for input(s): HGBA1C in the last 72 hours. Lipid Profile Recent Labs    07/23/18 0008  CHOL 115  HDL 33*  LDLCALC 61  TRIG 103  CHOLHDL 3.5   Thyroid function studies No results for input(s): TSH, T4TOTAL, T3FREE, THYROIDAB in the last 72 hours.  Invalid input(s): FREET3 Anemia work up No results for input(s): VITAMINB12, FOLATE, FERRITIN, TIBC, IRON, RETICCTPCT in the last 72 hours. Microbiology No results found for this or any previous visit (from the past 240 hour(s)).   Discharge Instructions:    Allergies as of 07/23/2018      Reactions   Lisinopril Cough   Iohexol Itching, Rash, Other (See Comments)    Code: RASH, Desc: PATIENT STATED THAT HE BEGAN ITCHING TOWARDS END OF INJECTION OF IV CONTRAST-- 13 HR PREP RECOMMENDED/MMS      Medication List    TAKE these medications   amLODipine 5 MG tablet Commonly known as:  NORVASC Take 1 tablet (5 mg total) daily by mouth.   apixaban 5 MG Tabs tablet Commonly known as:  ELIQUIS Take 1 tablet (5 mg total) by mouth 2 (two) times daily.   aspirin EC 81 MG tablet Take 81 mg by mouth daily.   atorvastatin 80 MG tablet Commonly known as:  LIPITOR Take 1 tablet (80 mg total) by mouth daily. What changed:  when to take this   carvedilol 3.125 MG tablet Commonly known as:  COREG Take 1 tablet (3.125 mg total) 2 (two) times daily by mouth.   diltiazem 30 MG tablet Commonly known as:  CARDIZEM TAKE 1 TABLET EVERY 4 HOURS AS NEEDED FOR IRREGULAR HR>100 AND BP>100   famotidine 40 MG tablet Commonly known as:  PEPCID Take 1 tablet (40 mg total) by mouth 2 (two) times daily.   fish oil-omega-3  fatty acids 1000 MG capsule Take 1,000 mg by mouth daily.   furosemide 20 MG tablet Commonly known as:  LASIX Take 1 tablet (20 mg total) by mouth daily.   irbesartan 300 MG tablet Commonly known as:  AVAPRO Take 1 tablet (300 mg total) by mouth daily.   isosorbide mononitrate 60 MG 24 hr tablet Commonly known as:  IMDUR TAKE 1 TABLET BY MOUTH EVERY DAY ( CAN NOT BE FILL 06.04.2018) What changed:  See the new instructions.   KLOR-CON M10 10 MEQ tablet Generic drug:  potassium chloride TAKE 1 TABLET BY MOUTH TWICE A DAY   loratadine 10 MG tablet Commonly known as:  CLARITIN Take 10 mg by mouth daily as needed (seasonal allergies).   multivitamin with minerals Tabs tablet Take 1 tablet by mouth daily.   nitroGLYCERIN 0.4 MG SL tablet Commonly known as:  NITROSTAT Place 1  tablet (0.4 mg total) under the tongue every 5 (five) minutes as needed for chest pain (shortness of breath).      Follow-up Information    [facility] Follow up on 07/27/2018.   Specialty:  Cardiology Why:  1:15 pm Contact information: [address] [place] [phone]       [name], MD Follow up on 07/30/2018.   Specialty:  Cardiology Why:  11:30 am for  hospital follow up Contact information: [place] [address] [phone]        [name], MD Follow up in 1 week(s).   Specialty:  Internal Medicine Contact information: [place], [place] [facility] [place] [address] [phone]            Time coordinating discharge: 25 min  Signed:  Geradine Girt  [facility] 07/23/2018, 2:48 PM

## 2018-07-23 NOTE — Progress Notes (Signed)
Paged MD per pt request, stating "I have not seen an MD" family at bedside states "hes just been doing a lot of laying"  Educated pt and pt family on plan of care   MD at bedsie now   Called echo regarding scheduled 2D echo

## 2018-07-23 NOTE — Progress Notes (Addendum)
Pt discharge education provided at bedside with pt and pt wife. Pt has all belongings. IV removed, catheter intact and telemetry removed. Pt discharged, pt refusing wheelchair, pt ambulated off unit with home assistive device

## 2018-07-23 NOTE — Progress Notes (Signed)
MD Hilty stated pt can eat, MD recommending outpt stress test

## 2018-07-23 NOTE — Consult Note (Signed)
Cardiology Consultation:   Patient ID: Rodney [name]; [id number]; 1941-09-27   Admit date: 07/22/2018 Date of Consult: 07/23/2018  Primary Care Provider: [name], MD Primary Cardiologist: [name], MD  Primary Electrophysiologist:     Patient Profile:   Rodney [name] is a 77 y.o. 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 Eliseo Squires.  History of Present Illness:   Mr. Rodney [name] was recently seen in the office with Dr. Debara Pickett (10/17/17) and Dr. [name] (11/07/17). Dr. Debara Pickett restarted low dose beta blocker, which had been stopped while he was on amiodarone for ablation. It was suggested BB would help with his occasional palpitations. He is anticoagulated on eliquis.   He presented to [facility] with complaints of chest pain. Yesterday, he walked to his mailbox and experienced chest pressure across his precordium without radiation. The pain lasted only about 10 min before spontaneously resolving. He did not take nitro. He drove to the ER and walked in ambulatory. The chest pain was associated with mild shortness of breath, but no nausea or diaphoresis. He has not had a recurrence in chest pain. He states he has been in Afib off and on about 4 times over the past month. He is compliant on all medications.   Troponin x 3 negative. EKG with lateral ST changes and TWI in inferior leads that are seen on 10/2016 EKG.   He continues to smoke. He plays golf twice weekly, but rides a golf cart instead of walking.   Past Medical History:  Diagnosis Date  . AAA (abdominal aortic aneurysm) ([place])    stent graft put in 02/2009  . Arthritis   . CAD (coronary artery disease)    a. cath 2010 b. CAD s/p PCI to ramus and LCX  on 03/04/15  . Gout   . Hyperlipidemia   . Hypertension   . OSA (obstructive sleep apnea) 09/30/2015   Very mild with AHI 6.2/hr  . Paroxysmal atrial fibrillation ([facility])   . Tobacco abuse     Past Surgical History:  Procedure Laterality Date  . ABDOMINAL AORTIC ANEURYSM REPAIR  02/19/2009   performed by [facility]  . ATRIAL FIBRILLATION ABLATION N/A 04/28/2017   Procedure: Atrial Fibrillation Ablation;  Surgeon: Constance Haw, MD;  Location: [facility];  Service: Cardiovascular;  Laterality: N/A;  . COLONOSCOPY    . KNEE ARTHROSCOPY  2003   left  . LEFT AND RIGHT HEART CATHETERIZATION WITH CORONARY ANGIOGRAM N/A 03/03/2015   Procedure: LEFT AND RIGHT HEART CATHETERIZATION WITH CORONARY ANGIOGRAM;  Surgeon: Troy Sine, MD;    . PERCUTANEOUS CORONARY STENT INTERVENTION (PCI-S) N/A 03/04/2015   Procedure: PERCUTANEOUS CORONARY STENT INTERVENTION (PCI-S);  Surgeon: Troy Sine, MD; RI 99>>0% w/   BMS, bifurcation CFX-OM 90>>40% w/ Angiosculpt PTCA; dCFX 90>>0% w/   2.7520 mm Rebel BMS  . SHOULDER ARTHROSCOPY WITH ROTATOR CUFF REPAIR AND SUBACROMIAL DECOMPRESSION Left 11/19/2013   Procedure: LEFT SHOULDER ARTHROSCOPY DEBRIDEMENT EXTENTSIVE DISTAL CLAVICULECTOMY DECOMPRESSION PARTIAL ACROMIOPLASTY WITH CORACOACROMIAL WITH ROTATOR CUFF REPAIR ;  Surgeon: Renette Butters, MD;  Location: [place];  Service: Orthopedics;  Laterality: Left;     Home Medications:  Prior to Admission medications   Medication Sig  Start Date End Date Taking? Authorizing Provider  amLODipine (NORVASC) 5 MG tablet Take 1 tablet (5 mg total) daily by mouth. 10/17/17  Yes Hilty, Nadean Corwin, MD  apixaban (ELIQUIS) 5 MG TABS tablet Take 1 tablet (5 mg total) by mouth 2 (two) times daily. 05/26/15  Yes [name], MD  aspirin EC 81 MG tablet Take 81 mg by mouth daily.   Yes [provider]  atorvastatin (LIPITOR) 80 MG tablet Take 1 tablet (80 mg total) by mouth daily. Patient  taking differently: Take 80 mg by mouth at bedtime.  05/21/15  Yes Hilty, Nadean Corwin, MD  carvedilol (COREG) 3.125 MG tablet Take 1 tablet (3.125 mg total) 2 (two) times daily by mouth. 10/17/17 07/22/18 Yes Hilty, Nadean Corwin, MD  diltiazem (CARDIZEM) 30 MG tablet TAKE 1 TABLET EVERY 4 HOURS AS NEEDED FOR IRREGULAR HR>100 AND BP>100 01/02/17  Yes Sherran Needs, NP  famotidine (PEPCID) 40 MG tablet Take 1 tablet (40 mg total) by mouth 2 (two) times daily. 10/19/15  Yes Hilty, Nadean Corwin, MD  fish oil-omega-3 fatty acids 1000 MG capsule Take 1,000 mg by mouth daily.    Yes [provider]  furosemide (LASIX) 20 MG tablet Take 1 tablet (20 mg total) by mouth daily. 03/30/16  Yes Hilty, Nadean Corwin, MD  irbesartan (AVAPRO) 300 MG tablet Take 1 tablet (300 mg total) by mouth daily. 06/08/18  Yes Barrett, Evelene Croon, PA-C  isosorbide mononitrate (IMDUR) 60 MG 24 hr tablet TAKE 1 TABLET BY MOUTH EVERY DAY ( CAN NOT BE FILL 06.04.2018) Patient taking differently: Take 60 mg by mouth daily.  03/14/18  Yes Hilty, Nadean Corwin, MD  KLOR-CON M10 10 MEQ tablet TAKE 1 TABLET BY MOUTH TWICE A DAY 11/23/17  Yes Hilty, Nadean Corwin, MD  loratadine (CLARITIN) 10 MG tablet Take 10 mg by mouth daily as needed (seasonal allergies).    Yes [provider]  Multiple Vitamin (MULTIVITAMIN WITH MINERALS) TABS tablet Take 1 tablet by mouth daily.   Yes [provider]  nitroGLYCERIN (NITROSTAT) 0.4 MG SL tablet Place 1 tablet (0.4 mg total) under the tongue every 5 (five) minutes as needed for chest pain (shortness of breath). 11/07/17  Yes Camnitz, Ocie Doyne, MD    Inpatient Medications: Scheduled Meds: . amLODipine  5 mg Oral Daily  . apixaban  5 mg Oral BID  . aspirin EC  81 mg Oral Daily  . atorvastatin  80 mg Oral QHS  . carvedilol  3.125 mg Oral BID  . famotidine  40 mg Oral BID  . furosemide  20 mg Oral Daily  . irbesartan  300 mg Oral Daily  . isosorbide mononitrate  60 mg Oral Daily  . omega-3 acid  ethyl esters  1 g Oral Daily  . potassium chloride  10 mEq Oral BID   Continuous Infusions:  PRN Meds: acetaminophen, ALPRAZolam, camphor-menthol, gi cocktail, morphine injection, nitroGLYCERIN, ondansetron (ZOFRAN) IV  Allergies:    Allergies  Allergen Reactions  . Lisinopril Cough  . Iohexol Itching, Rash and Other (See Comments)     Code: RASH, Desc: PATIENT STATED THAT HE BEGAN ITCHING TOWARDS END OF INJECTION OF IV CONTRAST-- 13 HR PREP RECOMMENDED/MMS     Social History:   Social History   Socioeconomic History  . Marital status: Married    Spouse name: Not on file  . Number of children: Not on file  . Years of education: Not on file  . Highest education level: Not  on file  Occupational History  . Not on file  Social Needs  . Financial resource strain: Not on file  . Food insecurity:    Worry: Not on file    Inability: Not on file  . Transportation needs:    Medical: Not on file    Non-medical: Not on file  Tobacco Use  . Smoking status: Former Smoker    Packs/day: 0.50    Years: 50.00    Pack years: 25.00    Types: Cigarettes    Last attempt to quit: 03/01/2016    Years since quitting: 2.3  . Smokeless tobacco: Never Used  Substance and Sexual Activity  . Alcohol use: Yes    Alcohol/week: 7.0 - 8.0 standard drinks    Types: 6 Cans of beer, 1 - 2 Shots of liquor per week    Comment: couple shots of royal per day  . Drug use: No  . Sexual activity: Not Currently    Comment: cutting down 1/2 ppd  Lifestyle  . Physical activity:    Days per week: Not on file    Minutes per session: Not on file  . Stress: Not on file  Relationships  . Social connections:    Talks on phone: Not on file    Gets together: Not on file    Attends religious service: Not on file    Active member of club or organization: Not on file    Attends meetings of clubs or organizations: Not on file    Relationship status: Not on file  . Intimate partner violence:    Fear of current  or ex partner: Not on file    Emotionally abused: Not on file    Physically abused: Not on file    Forced sexual activity: Not on file  Other Topics Concern  . Not on file  Social History Narrative   Lives with wife, does not use assist, drives.  No home health services.      Family History:    Family History  Problem Relation Age of Onset  . Prostate cancer Father   . Hypertension Mother   . Diabetes Sister   . Hypertension Sister   . Stroke Neg Hx   . Heart attack Neg Hx      ROS:  Please see the history of present illness.   All other ROS reviewed and negative.     Physical Exam/Data:   Vitals:   07/22/18 1953 07/23/18 0411 07/23/18 0907 07/23/18 1148  BP: (!) 158/71 124/68 (!) 147/77 131/79  Pulse: (!) 56 64 (!) 57 (!) 52  Resp: 18 18  18   Temp: (!) 97.3 F (36.3 C) 98.3 F (36.8 C)    TempSrc: Oral Oral    SpO2: 98% 98%  97%  Weight:      Height:        Intake/Output Summary (Last 24 hours) at 07/23/2018 1254 Last data filed at 07/23/2018 1200 Gross per 24 hour  Intake 2083.32 ml  Output 1215 ml  Net 868.32 ml   Filed Weights   07/22/18 0854 07/22/18 1555 07/22/18 1644  Weight: 98 kg 95.4 kg 95.4 kg   Body mass index is 27.01 kg/m.  General:  Well nourished, well developed, in no acute distress HEENT: normal Neck: no JVD Vascular: No carotid bruits  Cardiac:  normal S1, S2; RRR; no murmur Lungs:  clear to auscultation bilaterally, no wheezing, rhonchi or rales  Abd: soft, nontender, no hepatomegaly  Ext: no edema  Musculoskeletal:  No deformities, BUE and BLE strength normal and equal Skin: warm and dry  Neuro:  CNs 2-12 intact, no focal abnormalities noted Psych:  Normal affect   EKG:  The EKG was personally reviewed and demonstrates:  Sinus, TWI inferior and lateral leads Telemetry:  Telemetry was personally reviewed and demonstrates:  Sinus - sinus brady, occasional PVC  Relevant CV Studies:  Echo 10/30/17: Study Conclusions - Left  ventricle: The cavity size was normal. Wall thickness was   normal. Systolic function was normal. The estimated ejection   fraction was in the range of 50% to 55%. Wall motion was normal;   there were no regional wall motion abnormalities. Doppler   parameters are consistent with abnormal left ventricular   relaxation (grade 1 diastolic dysfunction). - Aorta: Ascending aortic diameter: 45 mm (S). - Ascending aorta: The ascending aorta was moderately dilated. - Mitral valve: There was mild regurgitation. - Left atrium: The atrium was mildly dilated.  Impressions: - EF has improved when compared to prior study (45%)   Laboratory Data:  Chemistry Recent Labs  Lab 07/22/18 0905  NA 139  K 4.1  CL 109  CO2 23  GLUCOSE 114*  BUN 10  CREATININE 1.27*  CALCIUM 8.9  GFRNONAA 53*  GFRAA >60  ANIONGAP 7    No results for input(s): PROT, ALBUMIN, AST, ALT, ALKPHOS, BILITOT in the last 168 hours. Hematology Recent Labs  Lab 07/22/18 0905  WBC 3.1*  RBC 4.73  HGB 13.9  HCT 43.8  MCV 92.6  MCH 29.4  MCHC 31.7  RDW 13.4  PLT 133*   Cardiac Enzymes Recent Labs  Lab 07/22/18 1532 07/22/18 1805 07/23/18 0008  TROPONINI <0.03 <0.03 <0.03    Recent Labs  Lab 07/22/18 0906  TROPIPOC 0.00    BNP Recent Labs  Lab 07/22/18 0905  BNP 60.5    DDimer No results for input(s): DDIMER in the last 168 hours.  Radiology/Studies:  Dg Chest 2 View  Result Date: 07/22/2018 CLINICAL DATA:  Shortness of breath. EXAM: CHEST - 2 VIEW COMPARISON:  03/20/2017 FINDINGS: The heart size and mediastinal contours are within normal limits. Stable mild scarring/atelectasis at both lung bases. There is no evidence of pulmonary edema, consolidation, pneumothorax, nodule or pleural fluid. The visualized skeletal structures are unremarkable. IMPRESSION: No acute findings.  Stable scarring/atelectasis in both lungs. Electronically Signed   By: Aletta Edouard M.D.   On: 07/22/2018 09:22     Assessment and Plan:   1. Chest pain, known CAD, s/p stents to LCx and RI 2016 - troponin x 3 negative - EKG sinus, TWI in V4/5/6 (old) and TWI inferior leads (on 2017 EKG) - he has not had a recurrence of chest pain - would favor a more conservative ischemic evaluation, such as stress tests in this pt who has known stents   2. Hx of Afib s/p ablation 2018, palpitations controlled with BB, anticoagulated with eliquis - pt has maintained sinus rhythm - sinus brady, occasional PVC - This patients CHA2DS2-VASc Score and unadjusted Ischemic Stroke Rate (% per year) is equal to 4.8 % stroke rate/year from a score of 4 (age, CHF, HTN) - he states he has a machine at home that can tell him when he's in Afib and he has been in Afib about 4 times over the past month - he has not taken PRN cardizem   3. HTN - home meds: norvasc, coreg, and irbesartan - pressures have been    4. Mixed  ischemic and nonischemic cardiomyopathy, hx of chronic systolic heart failure, now normal EF of 50-55% - pt is euvolemic on exam  - hold off on repeat echo for now   5. Current smoker - encouraged cessation    For questions or updates, please contact [place] Please consult [email or web address] for contact info under Cardiology/STEMI.   Signed, Ledora Bottcher, [place]  07/23/2018 12:54 PM

## 2018-07-23 NOTE — Progress Notes (Signed)
Echo stated 3 more pt ahead on schedule but 2D echo will be completed this afternoon

## 2018-07-23 NOTE — Discharge Instructions (Addendum)

## 2018-07-24 LAB — HEMOGLOBIN A1C
HEMOGLOBIN A1C: 5.8 % — AB (ref 4.8–5.6)
MEAN PLASMA GLUCOSE: 120 mg/dL

## 2018-07-25 ENCOUNTER — Telehealth (HOSPITAL_COMMUNITY): Payer: Self-pay

## 2018-07-25 NOTE — Telephone Encounter (Signed)
Encounter complete. 

## 2018-07-27 ENCOUNTER — Ambulatory Visit (HOSPITAL_BASED_OUTPATIENT_CLINIC_OR_DEPARTMENT_OTHER)
Admit: 2018-07-27 | Discharge: 2018-07-27 | Disposition: A | Discharge status: Home / Self Care | Payer: Medicare Other | Attending: Physician Assistant | Admitting: Physician Assistant

## 2018-07-27 DIAGNOSIS — I2510 Atherosclerotic heart disease of native coronary artery without angina pectoris: Secondary | ICD-10-CM

## 2018-07-27 MED ORDER — TECHNETIUM TC 99M TETROFOSMIN IV KIT
32.1000 | PACK | Freq: Once | INTRAVENOUS | Status: AC | PRN
  Administered 2018-07-27: 32.1 via INTRAVENOUS
  Filled 2018-07-27: qty 33

## 2018-07-27 MED ORDER — TECHNETIUM TC 99M TETROFOSMIN IV KIT
10.3000 | PACK | Freq: Once | INTRAVENOUS | Status: AC | PRN
  Administered 2018-07-27: 10.3 via INTRAVENOUS
  Filled 2018-07-27: qty 11

## 2018-07-27 MED ORDER — REGADENOSON 0.4 MG/5ML IV SOLN
0.4000 mg | Freq: Once | INTRAVENOUS | Status: AC
  Administered 2018-07-27: 0.4 mg via INTRAVENOUS

## 2018-07-27 MED ORDER — AMINOPHYLLINE 25 MG/ML IV SOLN
75.0000 mg | Freq: Once | INTRAVENOUS | Status: AC
  Administered 2018-07-27: 75 mg via INTRAVENOUS

## 2018-07-29 LAB — MYOCARDIAL PERFUSION IMAGING
CHL CUP NUCLEAR SSS: 10
CHL CUP RESTING HR STRESS: 50 {beats}/min
LVDIAVOL: 147 mL (ref 62–150)
LVSYSVOL: 89 mL
Peak HR: 85 {beats}/min
SDS: 7
SRS: 3
TID: 1.09

## 2018-07-30 ENCOUNTER — Ambulatory Visit: Payer: Medicare Other | Admitting: Internal Medicine

## 2018-07-30 ENCOUNTER — Encounter: Payer: Self-pay | Admitting: Internal Medicine

## 2018-07-30 VITALS — BP 122/88 | HR 54 | Ht 74.0 in | Wt 216.0 lb

## 2018-07-30 DIAGNOSIS — I428 Other cardiomyopathies: Secondary | ICD-10-CM | POA: Diagnosis not present

## 2018-07-30 DIAGNOSIS — I10 Essential (primary) hypertension: Secondary | ICD-10-CM

## 2018-07-30 DIAGNOSIS — I480 Paroxysmal atrial fibrillation: Secondary | ICD-10-CM

## 2018-07-30 DIAGNOSIS — I2510 Atherosclerotic heart disease of native coronary artery without angina pectoris: Secondary | ICD-10-CM | POA: Diagnosis not present

## 2018-07-30 NOTE — Patient Instructions (Signed)
Your physician has requested that you have a limited echocardiogram. Echocardiography is a painless test that uses sound waves to create images of your heart. It provides your doctor with information about the size and shape of your heart and how well your heart's chambers and valves are working. This procedure takes approximately one hour. There are no restrictions for this procedure. This is done at [address].  Your physician wants you to follow-up in: 6 months with Dr. Debara Pickett. You will receive a reminder letter in the mail two months in advance. If you don't receive a letter, please call our office to schedule the follow-up appointment.

## 2018-07-30 NOTE — Progress Notes (Signed)
OFFICE NOTE  Chief Complaint:  Hospital follow-up  Primary Care Physician: [name], MD  HPI:  Rodney [name] is a 77 y.o. male with a past medical history significant for CAD, hypertension, dyslipidemia, and AAA s/p stent-grafting in 2012 by Dr. Kellie Simmering. Preoperatively, he underwent a stress test which was abnormal, showing an inferior defect consistent with possible scar and mild to moderate LV dysfunction. He had a LHC, which revealed the following:  ANGIOGRAPHY: Left main: The left main is fairly normal.  The left anterior descending artery has some proximal luminal irregularities between 20 and 30%. The mid vessel stenosis around 30- 40%. It gives off several small diagonal arteries. The first diagonal artery is moderate in size and has mild irregularities. The second diagonal artery is very tiny and has an 80% proximal stenosis. This vessel is quite small and is not a candidate for PTCA.  The remaining LAD has only minor luminal irregularities.  The left circumflex artery is a large vessel. It gives off a very high obtuse marginal artery. The remaining circumflex artery continues around the A-V groove and supplies a posterolateral branch.  The first obtuse marginal artery is subtotally occluded at 2 different sites. There was sluggish TIMI grade 1 flow through this vessel. This vessel reaches around the lateral wall and supplies a small amount of the inferior left ventricle. This was quite likely where the site of the abnormal Cardiolite.  The right coronary artery is large and dominant. There are minor luminal irregularities. The posterior descending artery and the posterolateral segment artery, they taper fairly quickly at the takeoff. This taper improved slightly with intracoronary nitroglycerin. There are only minor luminal irregularities between 20 and 30%.  The left ventriculogram was performed in a 30-RAO  position. It reveals a moderately dilated and hypocontractile left ventricle. The ejection fraction is probably 40%.  During this left ventriculogram, we noticed that he had a very dilated aortic root. We performed an aortic root angiography, which revealed a dilated aortic root. There was no evidence of aortic insufficiency.  He now presents with accelerated hypertension and dyspnea, but no chest pain -troponin is borderline elevated at 0.05 and 0.07, BNP is elevated at 331. CXR demonstrates pulmonary edema. EKG shows sinus rhythm with LVH and lateral repolarization changes. Cardiology is asked to consult regarding management.  Rodney [name] was recently hospitalized and treated for heart failure. He started on diuretic and we discontinued his nifedipine and switched him to carvedilol. He recently followed up with his primary care provider who noted he was in new onset atrial fibrillation. It's feasible that atrial fibrillation was the precipitating factor for his heart failure. He seems to be unaware of his atrial fibrillation. A repeat EKG in our office today shows that he is converted back to sinus rhythm but he does have deep anterolateral and inferior T-wave inversions. These could be due to repolarization secondary to LVH however given his history of known coronary artery disease, could represent ischemia.  I saw [name] back in the office today. He underwent a nuclear stress test which was interpreted as follows:  Overall Impression: High risk stress nuclear study due to  severely decreased LV function (EF 28% - down from 445). There is a moderate inferior wall scar. No reversible ischemia is seen.. Consider multivessel CAD  versus adverse post-infarction remodeling as cause of worsening  Cardiomyopathy.  Rodney [name] returns today for follow-up. Recently he underwent cardiac catheterization and underwent stenting to the ramus and circumflex  as follows:  Difficult but successful  complex intervention involving PTCA/BM stenting of a subtotal ramus intermediate vessel with the entire proximal region being reduced to 0% in an intermediate vessel which extended to the LV apex once opened; and Angiosculpt/PTCA of bifurcation stenosis involving the distal circumflex flex marginal branch and Angiosculpt/BM stenting of a dominant distal circumflex extending into the PD/PLA vessel.  Subsequently he was noted to have increasing problems with palpitations and atrial fibrillation with rapid ventricular response. We elected to start him on dofetilide which he did well with however had some QT prolongation on the 500 g dose. Subsequently was placed on 250 g twice a day and is tolerating that well. Overall he feels well and has not had recurrence since discharge. He did feel subjective palpitations recently and went to the emergency room. He also was notably hypertensive. His rhythm was sinus. Blood pressure still is persistently elevated despite being on max dose irbesartan, carvedilol, Imdur and Lasix. He denies any chest pain. He successfully stop smoking and currently using nicotine patches but is having cravings.  Rodney [name] returns today for follow-up. Overall he seems to be doing fairly well. He exercises regularly and has gotten his weight down to his lowest wait which he has not seen a number of years. He reports very infrequent episodes of A. fib, maybe 2 or 3 over the past year which represents good control on Tikosyn. 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 This has not been assessed in the last year. He occasionally gets some bleeding of his gums which of encouraged him to follow-up with his dentist. He needs to stay on aspirin on a daily basis because of the coronary benefit. Recently he saw Roderic Palau, NP for ongoing follow-up of Tikosyn. His potassium was 3.9 and she encouraged him to take potassium supplements to try to get it above 4.0. Were requested a recheck  his potassium today.  04/24/2017  Rodney [name] returns today for hospital follow-up. He was hospitalized in April with recurrent A. fib. He was weaned off Tikosyn and started on amiodarone. He was seen by Dr. [name] for a-fib ablation. He reports having had a rash on 400 mg amiodarone but that resolved with decreasing the dose to 200 mg daily. He scheduled to see Dr. [name] tomorrow and have CT angiography with a planned A. fib ablation on Friday.  10/17/2017  Mr. Alcantar returns today for follow-up.  He underwent A. fib ablation with Dr. [name] and had been on amiodarone.  That was discontinued this past summer and he reports generally very few episodes of palpitations.  He also has a history of mixed ischemic and nonischemic cardiomyopathy with EF as low as 35% the recently came up to 45-50% as of May 2017.  In general he feels well, denying any chest pain or worsening shortness of breath.  He continues to play golf and can play 18 holes without stopping.  Review of his medications indicate that he is no longer on a beta-blocker, which was likely stopped when he went on to amiodarone.   07/30/2018  Mr. Domingos was seen today in follow-up.  He was recently seen by myself in the hospital in consultation for chest pain.  He ruled out for MI.  He was not in A. fib but related that he had had some recurrent A. fib recently.  He has successfully undergone A. fib ablation with Dr. [name].  He had been taken off of amiodarone but is maintained  on Eliquis.  I recommended outpatient nuclear stress testing.  He did undergo that nuclear stress test yesterday which indicated a small fixed inferior defect, possibly scar or attenuation artifact.  LVEF however was reduced to 39%.  His most recent echo in November showed an EF of 50 to 55%.  He reports he is made a change in decreasing his alcohol intake and back to off on both nicotine and marijuana use.  PMHx:  Past Medical History:  Diagnosis Date  . AAA (abdominal  aortic aneurysm) ([facility])    stent graft put in 02/2009  . Arthritis   . CAD (coronary artery disease)    a. cath 2010 b. CAD s/p PCI to ramus and LCX on 03/04/15  . Gout   . Hyperlipidemia   . Hypertension   . OSA (obstructive sleep apnea) 09/30/2015   Very mild with AHI 6.2/hr  . Paroxysmal atrial fibrillation ([facility])   . Tobacco abuse     Past Surgical History:  Procedure Laterality Date  . ABDOMINAL AORTIC ANEURYSM REPAIR  02/19/2009   performed by [facility]  . ATRIAL FIBRILLATION ABLATION N/A 04/28/2017   Procedure: Atrial Fibrillation Ablation;  Surgeon: Constance Haw, MD;  Location: [facility];  Service: Cardiovascular;  Laterality: N/A;  . COLONOSCOPY    . KNEE ARTHROSCOPY  2003   left  . LEFT AND RIGHT HEART CATHETERIZATION WITH CORONARY ANGIOGRAM N/A 03/03/2015   Procedure: LEFT AND RIGHT HEART CATHETERIZATION WITH CORONARY ANGIOGRAM;  Surgeon: Troy Sine, MD;    . PERCUTANEOUS CORONARY STENT INTERVENTION (PCI-S) N/A 03/04/2015   Procedure: PERCUTANEOUS CORONARY STENT INTERVENTION (PCI-S);  Surgeon: Troy Sine, MD; RI 99>>0% w/   BMS, bifurcation CFX-OM 90>>40% w/ Angiosculpt PTCA; dCFX 90>>0% w/   2.7520 mm Rebel BMS  . SHOULDER ARTHROSCOPY WITH ROTATOR CUFF REPAIR AND SUBACROMIAL DECOMPRESSION Left 11/19/2013   Procedure: LEFT SHOULDER ARTHROSCOPY DEBRIDEMENT EXTENTSIVE DISTAL CLAVICULECTOMY DECOMPRESSION PARTIAL ACROMIOPLASTY WITH CORACOACROMIAL WITH ROTATOR CUFF REPAIR ;  Surgeon: Renette Butters, MD;  Location: [facility];  Service: Orthopedics;  Laterality: Left;    FAMHx:  Family History  Problem Relation Age of Onset  . Prostate cancer Father   . Hypertension Mother   . Diabetes Sister   . Hypertension Sister   . Stroke Neg Hx   . Heart attack Neg Hx     SOCHx:   reports that he quit smoking about 2 years ago. His smoking use included cigarettes. He has a 25.00 pack-year smoking history. He has never used smokeless tobacco. He reports  that he drinks about 7.0 - 8.0 standard drinks of alcohol per week. He reports that he does not use drugs.  ALLERGIES:  Allergies  Allergen Reactions  . Lisinopril Cough  . Iohexol Itching, Rash and Other (See Comments)     Code: RASH, Desc: PATIENT STATED THAT HE BEGAN ITCHING TOWARDS END OF INJECTION OF IV CONTRAST-- 13 HR PREP RECOMMENDED/MMS     ROS: Pertinent items noted in HPI and remainder of comprehensive ROS otherwise negative.  HOME MEDS: Current Outpatient Medications  Medication Sig Dispense Refill  . amLODipine (NORVASC) 5 MG tablet Take 1 tablet (5 mg total) daily by mouth. 90 tablet 3  . apixaban (ELIQUIS) 5 MG TABS tablet Take 1 tablet (5 mg total) by mouth 2 (two) times daily. 180 tablet 3  . aspirin EC 81 MG tablet Take 81 mg by mouth daily.    Marland Kitchen atorvastatin (LIPITOR) 80 MG tablet Take 1 tablet (  80 mg total) by mouth daily. (Patient taking differently: Take 80 mg by mouth at bedtime. ) 90 tablet 3  . diltiazem (CARDIZEM) 30 MG tablet TAKE 1 TABLET EVERY 4 HOURS AS NEEDED FOR IRREGULAR HR>100 AND BP>100 45 tablet 2  . famotidine (PEPCID) 40 MG tablet Take 1 tablet (40 mg total) by mouth 2 (two) times daily. 180 tablet 1  . fish oil-omega-3 fatty acids 1000 MG capsule Take 1,000 mg by mouth daily.     . furosemide (LASIX) 20 MG tablet Take 1 tablet (20 mg total) by mouth daily. 30 tablet 11  . irbesartan (AVAPRO) 300 MG tablet Take 1 tablet (300 mg total) by mouth daily. 90 tablet 3  . isosorbide mononitrate (IMDUR) 60 MG 24 hr tablet TAKE 1 TABLET BY MOUTH EVERY DAY ( CAN NOT BE FILL 06.04.2018) (Patient taking differently: Take 60 mg by mouth daily. ) 90 tablet 2  . KLOR-CON M10 10 MEQ tablet TAKE 1 TABLET BY MOUTH TWICE A DAY 180 tablet 3  . loratadine (CLARITIN) 10 MG tablet Take 10 mg by mouth daily as needed (seasonal allergies).     . Multiple Vitamin (MULTIVITAMIN WITH MINERALS) TABS tablet Take 1 tablet by mouth daily.    . nitroGLYCERIN (NITROSTAT) 0.4 MG SL  tablet Place 1 tablet (0.4 mg total) under the tongue every 5 (five) minutes as needed for chest pain (shortness of breath). 30 tablet 2  . carvedilol (COREG) 3.125 MG tablet Take 1 tablet (3.125 mg total) 2 (two) times daily by mouth. 180 tablet 3   No current facility-administered medications for this visit.     LABS/IMAGING: No results found for this or any previous visit (from the past 48 hour(s)). No results found.  VITALS: BP 122/88   Pulse (!) 54   Ht 6\' 2"  (1.88 m)   Wt 216 lb (98 kg)   BMI 27.73 kg/m   EXAM: General appearance: alert and no distress Neck: no carotid bruit, no JVD and thyroid not enlarged, symmetric, no tenderness/mass/nodules Lungs: clear to auscultation bilaterally Heart: regular rate and rhythm Abdomen: soft, non-tender; bowel sounds normal; no masses,  no organomegaly Extremities: extremities normal, atraumatic, no cyanosis or edema Pulses: 2+ and symmetric Skin: Skin color, texture, turgor normal. No rashes or lesions Neurologic: Grossly normal Psych: Pleasant  EKG: Sinus bradycardia 54, inferior and lateral T wave changes-unchanged, personally reviewed  ASSESSMENT: 1. Recent chest pain with an intermediate risk Myoview stress test, LVEF 39% (07/2018) 2. CAD s/p PCI to the ramus intermedius and distal circumflex arteries (02/2015) 3. Ischemic cardiomyopathy EF 35-40%  - improved to 50-55% on 10/2017 4. History of systolic and diastolic congestive heart failure 5. Accelerated hypertension 6. History of abdominal aortic aneurysm repair 7. Paroxysmal atrial fibrillation - CHADSVASC 4, s/p ablation (2017)  PLAN: 1.   Mr. Croom had recurrent chest pain but ruled out for MI.  His stress test showed no reversible ischemia however there was a fixed inferior defect.  I suspect this is artifact less likely than scar.  EF was low at 39% and has been reduced in the past however improved up to 50 to 55% in November 2018.  We will repeat a limited echo to see  if his LVEF is actually reduced and may consider readjusting his heart failure medications based on that.  Follow-up with me afterwards.  [name], MD, [facility], [place] Director of the Advanced Lipid Disorders &  Cardiovascular Risk  Reduction Clinic Diplomate of the American Board of Clinical Lipidology Attending Cardiologist  Direct Dial: [phone]  Fax: [phone]  Website:  [email or web address] 07/30/2018, 2:09 PM

## 2018-08-07 ENCOUNTER — Other Ambulatory Visit: Payer: Self-pay

## 2018-08-07 ENCOUNTER — Ambulatory Visit (HOSPITAL_COMMUNITY): Payer: Medicare Other | Attending: Cardiovascular Disease

## 2018-08-07 DIAGNOSIS — I10 Essential (primary) hypertension: Secondary | ICD-10-CM | POA: Insufficient documentation

## 2018-08-07 DIAGNOSIS — I429 Cardiomyopathy, unspecified: Secondary | ICD-10-CM | POA: Diagnosis not present

## 2018-08-07 DIAGNOSIS — E785 Hyperlipidemia, unspecified: Secondary | ICD-10-CM | POA: Diagnosis not present

## 2018-08-07 DIAGNOSIS — F1010 Alcohol abuse, uncomplicated: Secondary | ICD-10-CM | POA: Insufficient documentation

## 2018-08-07 DIAGNOSIS — I4891 Unspecified atrial fibrillation: Secondary | ICD-10-CM | POA: Insufficient documentation

## 2018-08-07 DIAGNOSIS — I2510 Atherosclerotic heart disease of native coronary artery without angina pectoris: Secondary | ICD-10-CM | POA: Insufficient documentation

## 2018-08-07 DIAGNOSIS — Z87891 Personal history of nicotine dependence: Secondary | ICD-10-CM | POA: Insufficient documentation

## 2018-08-07 DIAGNOSIS — I428 Other cardiomyopathies: Secondary | ICD-10-CM | POA: Insufficient documentation

## 2018-11-18 ENCOUNTER — Other Ambulatory Visit (HOSPITAL_COMMUNITY): Payer: Self-pay | Admitting: Internal Medicine

## 2018-12-13 ENCOUNTER — Telehealth: Payer: Self-pay | Admitting: Internal Medicine

## 2018-12-13 NOTE — Telephone Encounter (Signed)
Patient notified MD has recommended PAOV. Scheduled to see Rhonda Jan 6

## 2018-12-13 NOTE — Telephone Encounter (Signed)
New message   Patient c/o Palpitations:  High priority if patient c/o lightheadedness, shortness of breath, or chest pain  1) How long have you had palpitations/irregular HR/ Afib? Are you having the symptoms now? For the last 3 days, yes   2) Are you currently experiencing lightheadedness, SOB or CP? SOB   3) Do you have a history of afib (atrial fibrillation) or irregular heart rhythm? Yes   Have you checked your BP or HR? (document readings if available): 130/73 bp 121/70 bp  63-72 HR all readings from today  4) Are you experiencing any other symptoms? No

## 2018-12-13 NOTE — Telephone Encounter (Signed)
Spoke with patient of Dr. Debara Pickett who has know PAF and has had an ablation. He reports he feels he has been in AF for 3 days off and on and this causes him to feel SOB. He reports some chest discomfort (rate 2 of 10 on pain scale). He reports his BP has been stable since ablation and his HR is controlled (60s-70s). He is unsure if his symptoms are from AF or issues with stents.  He had echo in August: normal LVEF  He had stress test in August:  EKG is nondiagnostic due to baseline changes  Decreased tracer activity in the inferior and basal inferoseptal wall consistent with possible scar and/or soft tissue attenuation No ischemia.  LVEF calculated at 39% Recommend echo to further define LVEF and wall motion if not already done  Intermediate risk study  Offered patient PAOV to review concerns but he would like an MD to review his concerns and advise  Routed to DOD

## 2018-12-13 NOTE — Telephone Encounter (Signed)
I think it would be helpful for him to be seen and get an Ecg. I think appt. With extender is appropriate.  Arpi Diebold [place] MD, [facility]

## 2018-12-14 ENCOUNTER — Emergency Department (HOSPITAL_COMMUNITY)
Admission: EM | Admit: 2018-12-14 | Discharge: 2018-12-14 | Disposition: A | Discharge status: Home / Self Care | Payer: Medicare Other | Attending: Emergency Medicine | Admitting: Emergency Medicine

## 2018-12-14 ENCOUNTER — Emergency Department (HOSPITAL_COMMUNITY): Payer: Medicare Other

## 2018-12-14 DIAGNOSIS — I5031 Acute diastolic (congestive) heart failure: Secondary | ICD-10-CM | POA: Diagnosis not present

## 2018-12-14 DIAGNOSIS — Z7901 Long term (current) use of anticoagulants: Secondary | ICD-10-CM | POA: Diagnosis not present

## 2018-12-14 DIAGNOSIS — R0600 Dyspnea, unspecified: Secondary | ICD-10-CM | POA: Insufficient documentation

## 2018-12-14 DIAGNOSIS — Z87891 Personal history of nicotine dependence: Secondary | ICD-10-CM | POA: Insufficient documentation

## 2018-12-14 DIAGNOSIS — Z7982 Long term (current) use of aspirin: Secondary | ICD-10-CM | POA: Diagnosis not present

## 2018-12-14 DIAGNOSIS — I2510 Atherosclerotic heart disease of native coronary artery without angina pectoris: Secondary | ICD-10-CM | POA: Insufficient documentation

## 2018-12-14 DIAGNOSIS — I480 Paroxysmal atrial fibrillation: Secondary | ICD-10-CM | POA: Diagnosis not present

## 2018-12-14 DIAGNOSIS — I110 Hypertensive heart disease with heart failure: Secondary | ICD-10-CM | POA: Insufficient documentation

## 2018-12-14 DIAGNOSIS — Z79899 Other long term (current) drug therapy: Secondary | ICD-10-CM | POA: Insufficient documentation

## 2018-12-14 DIAGNOSIS — R079 Chest pain, unspecified: Secondary | ICD-10-CM | POA: Diagnosis present

## 2018-12-14 DIAGNOSIS — R0609 Other forms of dyspnea: Secondary | ICD-10-CM

## 2018-12-14 LAB — CBC WITH DIFFERENTIAL/PLATELET
ABS IMMATURE GRANULOCYTES: 0.01 10*3/uL (ref 0.00–0.07)
Basophils Absolute: 0 10*3/uL (ref 0.0–0.1)
Basophils Relative: 1 %
Eosinophils Absolute: 0.1 10*3/uL (ref 0.0–0.5)
Eosinophils Relative: 2 %
HCT: 44.8 % (ref 39.0–52.0)
Hemoglobin: 14 g/dL (ref 13.0–17.0)
Immature Granulocytes: 0 %
Lymphocytes Relative: 42 %
Lymphs Abs: 1.3 10*3/uL (ref 0.7–4.0)
MCH: 28.4 pg (ref 26.0–34.0)
MCHC: 31.3 g/dL (ref 30.0–36.0)
MCV: 90.9 fL (ref 80.0–100.0)
Monocytes Absolute: 0.3 10*3/uL (ref 0.1–1.0)
Monocytes Relative: 8 %
Neutro Abs: 1.5 10*3/uL — ABNORMAL LOW (ref 1.7–7.7)
Neutrophils Relative %: 47 %
Platelets: 144 10*3/uL — ABNORMAL LOW (ref 150–400)
RBC: 4.93 MIL/uL (ref 4.22–5.81)
RDW: 14.2 % (ref 11.5–15.5)
WBC: 3.1 10*3/uL — ABNORMAL LOW (ref 4.0–10.5)
nRBC: 0 % (ref 0.0–0.2)

## 2018-12-14 LAB — PROTIME-INR
INR: 1.14
PROTHROMBIN TIME: 14.5 s (ref 11.4–15.2)

## 2018-12-14 LAB — COMPREHENSIVE METABOLIC PANEL
ALBUMIN: 3.8 g/dL (ref 3.5–5.0)
ALT: 26 U/L (ref 0–44)
AST: 25 U/L (ref 15–41)
Alkaline Phosphatase: 81 U/L (ref 38–126)
Anion gap: 5 (ref 5–15)
BUN: 11 mg/dL (ref 8–23)
CO2: 22 mmol/L (ref 22–32)
Calcium: 8.9 mg/dL (ref 8.9–10.3)
Chloride: 113 mmol/L — ABNORMAL HIGH (ref 98–111)
Creatinine, Ser: 1.29 mg/dL — ABNORMAL HIGH (ref 0.61–1.24)
GFR calc Af Amer: >60 mL/min (ref >60)
GFR calc non Af Amer: 53 mL/min — ABNORMAL LOW (ref >60)
Glucose, Bld: 132 mg/dL — ABNORMAL HIGH (ref 70–99)
Potassium: 4.2 mmol/L (ref 3.5–5.1)
Sodium: 140 mmol/L (ref 135–145)
Total Bilirubin: 0.7 mg/dL (ref 0.3–1.2)
Total Protein: 7 g/dL (ref 6.5–8.1)

## 2018-12-14 LAB — I-STAT TROPONIN, ED
Troponin i, poc: 0 ng/mL (ref 0.00–0.08)
Troponin i, poc: 0.01 ng/mL (ref 0.00–0.08)

## 2018-12-14 NOTE — Discharge Instructions (Signed)
You can take your as needed diltiazem if you develop recurrent palpitations according to prescription instructions. Get rechecked immediately if you develop severe chest pain, difficulty breathing or new concerning symptoms.

## 2018-12-14 NOTE — ED Provider Notes (Signed)
[facility] DEPARTMENT Provider Note   CSN: [id number] Arrival date & time: 12/14/18  3267     History   Chief Complaint Chief Complaint  Patient presents with  . Chest Pain    HPI MARDELL CRAGG is a 78 y.o. male.  The history is provided by the patient and medical records. No language interpreter was used.  Chest Pain     YASIR KITNER is a 78 y.o. male who presents to the Emergency Department complaining of palpitations. He has a history of paroxysmal atrial fibrillation and presents to the emergency department for three days of feeling fatigued with intermittent palpitations. When he has palpitations he has some chest discomfort, last episode was this morning. He has experienced similar episodes in the past related to his atrial fibrillation. He is compliant with his medications and takes them as directed, no recent medication changes. He denies any fevers, abdominal pain, nausea, vomiting. He does have mild dyspnea on exertion. He had palpitations and chest discomfort this morning, currently resolved. Past Medical History:  Diagnosis Date  . AAA (abdominal aortic aneurysm) ([place])    stent graft put in 02/2009  . Arthritis   . CAD (coronary artery disease)    a. cath 2010 b. CAD s/p PCI to ramus and LCX on 03/04/15  . Gout   . Hyperlipidemia   . Hypertension   . OSA (obstructive sleep apnea) 09/30/2015   Very mild with AHI 6.2/hr  . Paroxysmal atrial fibrillation ([facility])   . Tobacco abuse     Patient Active Problem List   Diagnosis Date Noted  . Chest pain 07/22/2018  . Abnormal EKG 07/22/2018  . Nonischemic cardiomyopathy ([place]) 10/17/2017  . AF (atrial fibrillation) ([place]) 04/28/2017  . Premature ventricular contractions (PVCs) ([facility]) 03/11/2017  . H/O endovascular stent graft for abdominal aortic aneurysm 09/01/2016  . Cardiomyopathy, ischemic 03/30/2016  . OSA (obstructive sleep apnea) 09/30/2015  . Encounter for monitoring  anti-arrhythmic therapy 05/04/2015  . DOE (dyspnea on exertion) 03/16/2015  . Atrial fibrillation with RVR ([place]) 03/16/2015  . Hypertensive urgency 03/08/2015  . Coronary artery disease due to lipid rich plaque   . AA (aortic aneurysm) ([place])   . Dyspnea 03/02/2015  . Progressive angina ([place]) 03/02/2015  . History of TIA (transient ischemic attack), 02/20/15 03/02/2015  . Mitral regurgitation 03/02/2015  . HX: anticoagulation 03/02/2015  . Chest tightness   . Tobacco abuse   . HLD (hyperlipidemia)   . TIA (transient ischemic attack) 02/20/2015  . 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 Chronic combined systolic and diastolic heart failure, NYHA class 1 ([place]) 02/20/2015  . PAF (paroxysmal atrial fibrillation) ([place]) 01/29/2015  . Accelerated hypertension, hx of 01/03/2015  . Acute diastolic CHF (congestive heart failure), NYHA class 2 ([place]) 01/03/2015  . Demand ischemia ([place]) 01/03/2015  . Coronary artery disease involving native coronary artery of native heart without angina pectoris 01/03/2015  . Troponin level elevated   . Thrombocytopenia ([place])   . Essential hypertension   . S/P abdominal aortic aneurysm repair 07/03/2012    Past Surgical History:  Procedure Laterality Date  . ABDOMINAL AORTIC ANEURYSM REPAIR  02/19/2009   performed by [facility]  . ATRIAL FIBRILLATION ABLATION N/A 04/28/2017   Procedure: Atrial Fibrillation Ablation;  Surgeon: Constance Haw, MD;  Location: [facility];  Service: Cardiovascular;  Laterality: N/A;  . COLONOSCOPY    . KNEE ARTHROSCOPY  2003   left  . LEFT AND RIGHT HEART CATHETERIZATION WITH CORONARY ANGIOGRAM  N/A 03/03/2015   Procedure: LEFT AND RIGHT HEART CATHETERIZATION WITH CORONARY ANGIOGRAM;  Surgeon: Troy Sine, MD;    . PERCUTANEOUS CORONARY STENT INTERVENTION (PCI-S) N/A 03/04/2015   Procedure: PERCUTANEOUS CORONARY STENT INTERVENTION (PCI-S);  Surgeon: Troy Sine, MD; RI 99>>0% w/   BMS, bifurcation CFX-OM 90>>40% w/ Angiosculpt PTCA;  dCFX 90>>0% w/   2.7520 mm Rebel BMS  . SHOULDER ARTHROSCOPY WITH ROTATOR CUFF REPAIR AND SUBACROMIAL DECOMPRESSION Left 11/19/2013   Procedure: LEFT SHOULDER ARTHROSCOPY DEBRIDEMENT EXTENTSIVE DISTAL CLAVICULECTOMY DECOMPRESSION PARTIAL ACROMIOPLASTY WITH CORACOACROMIAL WITH ROTATOR CUFF REPAIR ;  Surgeon: Renette Butters, MD;  Location: [facility];  Service: Orthopedics;  Laterality: Left;        Home Medications    Prior to Admission medications   Medication Sig Start Date End Date Taking? Authorizing Provider  amLODipine (NORVASC) 5 MG tablet Take 1 tablet (5 mg total) daily by mouth. 10/17/17  Yes Hilty, Nadean Corwin, MD  apixaban (ELIQUIS) 5 MG TABS tablet Take 1 tablet (5 mg total) by mouth 2 (two) times daily. 05/26/15  Yes [name], MD  aspirin EC 81 MG tablet Take 81 mg by mouth daily.   Yes [provider]  atorvastatin (LIPITOR) 80 MG tablet Take 1 tablet (80 mg total) by mouth daily. Patient taking differently: Take 80 mg by mouth at bedtime.  05/21/15  Yes Hilty, Nadean Corwin, MD  carvedilol (COREG) 3.125 MG tablet Take 1 tablet (3.125 mg total) 2 (two) times daily by mouth. 10/17/17 12/14/18 Yes Hilty, Nadean Corwin, MD  diltiazem (CARDIZEM) 30 MG tablet TAKE 1 TABLET EVERY 4 HOURS AS NEEDED FOR IRREGULAR HR>100 AND BP>100 Patient taking differently: Take 30 mg by mouth every 4 (four) hours as needed. Take 1 tablet every 4 hours AS NEEDED for irregular HR>100 and BP>100 01/02/17  Yes Sherran Needs, NP  famotidine (PEPCID) 40 MG tablet Take 1 tablet (40 mg total) by mouth 2 (two) times daily. 10/19/15  Yes Hilty, Nadean Corwin, MD  fish oil-omega-3 fatty acids 1000 MG capsule Take 1,000 mg by mouth daily.    Yes [provider]  furosemide (LASIX) 20 MG tablet Take 1 tablet (20 mg total) by mouth daily. 03/30/16  Yes Hilty, Nadean Corwin, MD  irbesartan (AVAPRO) 300 MG tablet Take 1 tablet (300 mg total) by mouth daily. 06/08/18  Yes Barrett, Evelene Croon, PA-C    isosorbide mononitrate (IMDUR) 60 MG 24 hr tablet TAKE 1 TABLET BY MOUTH EVERY DAY ( CAN NOT BE FILL 06.04.2018) Patient taking differently: Take 60 mg by mouth daily.  03/14/18  Yes Hilty, Nadean Corwin, MD  KLOR-CON M10 10 MEQ tablet TAKE 1 TABLET BY MOUTH TWICE A DAY 11/19/18  Yes Hilty, Nadean Corwin, MD  loratadine (CLARITIN) 10 MG tablet Take 10 mg by mouth daily as needed (seasonal allergies).    Yes [provider]  Multiple Vitamin (MULTIVITAMIN WITH MINERALS) TABS tablet Take 1 tablet by mouth daily.   Yes [provider]  nitroGLYCERIN (NITROSTAT) 0.4 MG SL tablet Place 1 tablet (0.4 mg total) under the tongue every 5 (five) minutes as needed for chest pain (shortness of breath). 11/07/17  Yes Camnitz, Ocie Doyne, MD    Family History Family History  Problem Relation Age of Onset  . Prostate cancer Father   . Hypertension Mother   . Diabetes Sister   . Hypertension Sister   . Stroke Neg Hx   . Heart attack Neg Hx  Social History Social History   Tobacco Use  . Smoking status: Former Smoker    Packs/day: 0.50    Years: 50.00    Pack years: 25.00    Types: Cigarettes    Last attempt to quit: 03/01/2016    Years since quitting: 2.7  . Smokeless tobacco: Never Used  Substance Use Topics  . Alcohol use: Yes    Alcohol/week: 7.0 - 8.0 standard drinks    Types: 6 Cans of beer, 1 - 2 Shots of liquor per week    Comment: couple shots of royal per day  . Drug use: No     Allergies   Lisinopril and Iohexol   Review of Systems Review of Systems  Cardiovascular: Positive for chest pain.  All other systems reviewed and are negative.    Physical Exam Updated Vital Signs BP 132/72   Pulse (!) 59   Temp 97.8 F (36.6 C) (Oral)   Resp 16   SpO2 99%   Physical Exam Vitals signs and nursing note reviewed.  Constitutional:      Appearance: He is well-developed.  HENT:     Head: Normocephalic and atraumatic.  Cardiovascular:     Rate and Rhythm:  Normal rate and regular rhythm.     Heart sounds: No murmur.  Pulmonary:     Effort: Pulmonary effort is normal. No respiratory distress.     Breath sounds: Normal breath sounds.  Abdominal:     Palpations: Abdomen is soft.     Tenderness: There is no abdominal tenderness. There is no guarding or rebound.  Musculoskeletal:        General: No swelling or tenderness.  Skin:    General: Skin is warm and dry.     Capillary Refill: Capillary refill takes less than 2 seconds.  Neurological:     Mental Status: He is alert and oriented to person, place, and time.  Psychiatric:        Mood and Affect: Mood normal.        Behavior: Behavior normal.      ED Treatments / Results  Labs (all labs ordered are listed, but only abnormal results are displayed) Labs Reviewed  COMPREHENSIVE METABOLIC PANEL - Abnormal; Notable for the following components:      Result Value   Chloride 113 (*)    Glucose, Bld 132 (*)    Creatinine, Ser 1.29 (*)    GFR calc non Af Amer 53 (*)    All other components within normal limits  CBC WITH DIFFERENTIAL/PLATELET - Abnormal; Notable for the following components:   WBC 3.1 (*)    Platelets 144 (*)    Neutro Abs 1.5 (*)    All other components within normal limits  PROTIME-INR  I-STAT TROPONIN, ED  I-STAT TROPONIN, ED    EKG EKG Interpretation  Date/Time:  Friday December 14 2018 10:05:55 EST Ventricular Rate:  85 PR Interval:  182 QRS Duration: 92 QT Interval:  406 QTC Calculation: 483 R Axis:   -5 Text Interpretation:  Sinus rhythm with Blocked Premature atrial complexes with Premature supraventricular complexes ST & T wave abnormality, consider inferolateral ischemia Prolonged QT Abnormal ECG Confirmed by Quintella Reichert [phone]) on 12/14/2018 11:20:17 AM   Radiology Dg Chest 2 View  Result Date: 12/14/2018 CLINICAL DATA:  Chest discomfort and shortness of Breath EXAM: CHEST - 2 VIEW COMPARISON:  07/22/2018 FINDINGS: Cardiac shadow is stable. The  lungs are well aerated bilaterally. No focal infiltrate or sizable effusion is seen.  Mild scarring is again noted in the bases bilaterally. No acute bony abnormality is seen. IMPRESSION: No acute abnormality noted. Electronically Signed   By: Inez Catalina M.D.   On: 12/14/2018 10:54    Procedures Procedures (including critical care time)  Medications Ordered in ED Medications - No data to display   Initial Impression / Assessment and Plan / ED Course  I have reviewed the triage vital signs and the nursing notes.  Pertinent labs & imaging results that were available during my care of the patient were reviewed by me and considered in my medical decision making (see chart for details).     Patient with paroxysmal atrial fibrillation here for evaluation of three days of fatigue, intermittent palpitations and chest discomfort. He is asymptomatic on evaluation in the emergency department. EKG with ischemic changes that are similar when compared to priors. Troponin is negative times two. No evidence of volume overload or CHF. Patient does have prescribed diltiazem, PRN for palpitations but he has not taken this. Discussed taking his PRN medications as well as outpatient follow-up and return precautions. He is scheduled to follow-up with cardiology on Monday.  Final Clinical Impressions(s) / ED Diagnoses   Final diagnoses:  None    ED Discharge Orders    None       Quintella Reichert, MD 12/14/18 1425

## 2018-12-14 NOTE — ED Triage Notes (Signed)
Pt here with c/o chest discomfort , pt has chronic afib , rate is fine , c/o sob but no sob

## 2018-12-14 NOTE — ED Notes (Signed)
Pt alert and oriented in NAD. Pt verbalized understanding of discharge instructions. 

## 2018-12-17 ENCOUNTER — Encounter: Payer: Self-pay | Admitting: Physician Assistant

## 2018-12-17 ENCOUNTER — Ambulatory Visit: Payer: Medicare Other | Admitting: Physician Assistant

## 2018-12-17 VITALS — BP 118/68 | HR 58 | Ht 74.0 in | Wt 222.2 lb

## 2018-12-17 DIAGNOSIS — I10 Essential (primary) hypertension: Secondary | ICD-10-CM | POA: Diagnosis not present

## 2018-12-17 DIAGNOSIS — I480 Paroxysmal atrial fibrillation: Secondary | ICD-10-CM | POA: Diagnosis not present

## 2018-12-17 DIAGNOSIS — I5042 Chronic combined systolic (congestive) and diastolic (congestive) heart failure: Secondary | ICD-10-CM

## 2018-12-17 DIAGNOSIS — R002 Palpitations: Secondary | ICD-10-CM

## 2018-12-17 DIAGNOSIS — I2510 Atherosclerotic heart disease of native coronary artery without angina pectoris: Secondary | ICD-10-CM

## 2018-12-17 MED ORDER — FUROSEMIDE 20 MG PO TABS: 20.0000 mg | ORAL_TABLET | ORAL | Status: DC

## 2018-12-17 MED ORDER — IRBESARTAN 300 MG PO TABS: 150.0000 mg | ORAL_TABLET | Freq: Every day | ORAL | Status: DC

## 2018-12-17 MED ORDER — CARVEDILOL 6.25 MG PO TABS: 6.2500 mg | ORAL_TABLET | Freq: Two times a day (BID) | ORAL | 5 refills | Status: DC

## 2018-12-17 NOTE — Progress Notes (Signed)
Cardiology Office Note   Date:  12/17/2018   ID:  [name], DOB [birth year], MRN [id number]  PCP:  [name], MD Cardiologist:  [name], MD 07/30/2018 [name], PA-C 2016  Chief Complaint  Patient presents with  . Shortness of Breath    states with no activity    History of Present Illness: Rodney [name] is a 78 y.o. male with a history of PCI RI & CFX 2016, HTN, HLD, AAA s/p stent-grafting in 2012 by Dr. Kellie Simmering, PAF s/p ablation on Eliquis off amio, ICM w/ EF 28% on 02/2015 MV but nl on 02/2018 echo  8/19 office visit after hospitalization for chest pain, patient doing well symptomatically but reported some episodes of atrial fib, EF 39% by Myoview, but 50-55% by echo (echo repeated and EF still normal) 12/14/2018 ER visit for palpitations, patient in sinus rhythm, continue as needed diltiazem, follow-up with cardiology  [name] presents for cardiology follow up.   For a year after the ablation he was fine. However, since May of 2019, he has noticed increasing palpitations. He has quit drinking ETOH completely. Is down to 1-2 cigs/day and only drinks a cup of coffee about every other day.   Even with all the lifestyle changes, he is getting more palpitations.   They are not exertional and last about an hour.  He does not get lightheaded or dizzy.  He does not get chest pain or shortness of breath with them.  He did this on 01/03, his BP became elevated. That is unusual. DBP was over 100. HR was less than 100.  Sx eventually resolved.  When his heart rate slowed back down, his blood pressure improved.  He plays golf 2 x week. He does some walking, has been nervous about doing that until sx improved.   No chest pain or SOB w/ exertion.   Occ wakes up w/ pain R thigh, improves w/ ambulation    Past Medical History:  Diagnosis Date  . AAA (abdominal aortic aneurysm) ([place])    stent graft put in 02/2009  . Arthritis   . CAD (coronary artery  disease)    a. cath 2010 b. CAD s/p PCI to ramus and LCX on 03/04/15  . Gout   . Hyperlipidemia   . Hypertension   . OSA (obstructive sleep apnea) 09/30/2015   Very mild with AHI 6.2/hr  . Paroxysmal atrial fibrillation ([facility])   . Tobacco abuse     Past Surgical History:  Procedure Laterality Date  . ABDOMINAL AORTIC ANEURYSM REPAIR  02/19/2009   performed by [facility]  . ATRIAL FIBRILLATION ABLATION N/A 04/28/2017   Procedure: Atrial Fibrillation Ablation;  Surgeon: Constance Haw, MD;  Location: [facility];  Service: Cardiovascular;  Laterality: N/A;  . COLONOSCOPY    . KNEE ARTHROSCOPY  2003   left  . LEFT AND RIGHT HEART CATHETERIZATION WITH CORONARY ANGIOGRAM N/A 03/03/2015   Procedure: LEFT AND RIGHT HEART CATHETERIZATION WITH CORONARY ANGIOGRAM;  Surgeon: Troy Sine, MD;    . PERCUTANEOUS CORONARY STENT INTERVENTION (PCI-S) N/A 03/04/2015   Procedure: PERCUTANEOUS CORONARY STENT INTERVENTION (PCI-S);  Surgeon: Troy Sine, MD; RI 99>>0% w/   BMS, bifurcation CFX-OM 90>>40% w/ Angiosculpt PTCA; dCFX 90>>0% w/   2.7520 mm Rebel BMS  . SHOULDER ARTHROSCOPY WITH ROTATOR CUFF REPAIR AND SUBACROMIAL DECOMPRESSION Left 11/19/2013   Procedure: LEFT SHOULDER ARTHROSCOPY DEBRIDEMENT EXTENTSIVE DISTAL CLAVICULECTOMY DECOMPRESSION PARTIAL ACROMIOPLASTY WITH CORACOACROMIAL WITH ROTATOR CUFF REPAIR ;  Surgeon: Renette Butters, MD;  Location: [facility];  Service: Orthopedics;  Laterality: Left;    Current Outpatient Medications  Medication Sig Dispense Refill  . amLODipine (NORVASC) 5 MG tablet Take 1 tablet (5 mg total) daily by mouth. 90 tablet 3  . apixaban (ELIQUIS) 5 MG TABS tablet Take 1 tablet (5 mg total) by mouth 2 (two) times daily. 180 tablet 3  . aspirin EC 81 MG tablet Take 81 mg by mouth daily.    Marland Kitchen atorvastatin (LIPITOR) 80 MG tablet Take 1 tablet (80 mg total) by mouth daily. (Patient taking differently: Take 80 mg by mouth at bedtime. ) 90 tablet 3    . carvedilol (COREG) 3.125 MG tablet Take 1 tablet (3.125 mg total) 2 (two) times daily by mouth. 180 tablet 3  . diltiazem (CARDIZEM) 30 MG tablet TAKE 1 TABLET EVERY 4 HOURS AS NEEDED FOR IRREGULAR HR>100 AND BP>100 (Patient taking differently: Take 30 mg by mouth every 4 (four) hours as needed. Take 1 tablet every 4 hours AS NEEDED for irregular HR>100 and BP>100) 45 tablet 2  . famotidine (PEPCID) 40 MG tablet Take 1 tablet (40 mg total) by mouth 2 (two) times daily. 180 tablet 1  . fish oil-omega-3 fatty acids 1000 MG capsule Take 1,000 mg by mouth daily.     . furosemide (LASIX) 20 MG tablet Take 1 tablet (20 mg total) by mouth daily. 30 tablet 11  . irbesartan (AVAPRO) 300 MG tablet Take 1 tablet (300 mg total) by mouth daily. 90 tablet 3  . isosorbide mononitrate (IMDUR) 60 MG 24 hr tablet TAKE 1 TABLET BY MOUTH EVERY DAY ( CAN NOT BE FILL 06.04.2018) (Patient taking differently: Take 60 mg by mouth daily. ) 90 tablet 2  . KLOR-CON M10 10 MEQ tablet TAKE 1 TABLET BY MOUTH TWICE A DAY 180 tablet 3  . loratadine (CLARITIN) 10 MG tablet Take 10 mg by mouth daily as needed (seasonal allergies).     . Multiple Vitamin (MULTIVITAMIN WITH MINERALS) TABS tablet Take 1 tablet by mouth daily.    . nitroGLYCERIN (NITROSTAT) 0.4 MG SL tablet Place 1 tablet (0.4 mg total) under the tongue every 5 (five) minutes as needed for chest pain (shortness of breath). 30 tablet 2   No current facility-administered medications for this visit.     Allergies:   Lisinopril and Iohexol    Social History:  The patient  reports that he quit smoking about 2 years ago. His smoking use included cigarettes. He has a 25.00 pack-year smoking history. He has never used smokeless tobacco. He reports current alcohol use of about 7.0 - 8.0 standard drinks of alcohol per week. He reports that he does not use drugs.   Family History:  The patient's family history includes Diabetes in his sister; Hypertension in his mother and  sister; Prostate cancer in his father.  He indicated that his mother is deceased. He indicated that his father is deceased. He indicated that the status of his sister is unknown. He indicated that his maternal grandmother is deceased. He indicated that his maternal grandfather is deceased. He indicated that his paternal grandmother is deceased. He indicated that his paternal grandfather is deceased. He indicated that the status of his neg hx is unknown.    ROS:  Please see the history of present illness. All other systems are reviewed and negative.    PHYSICAL EXAM: VS:  BP 118/68   Pulse (!) 58   Ht 6'  2" (1.88 m)   Wt 222 lb 3.2 oz (100.8 kg)   BMI 28.53 kg/m  , BMI Body mass index is 28.53 kg/m. GEN: Well nourished, well developed, male in no acute distress HEENT: normal for age  Neck: no JVD, no carotid bruit, no masses Cardiac: RRR; soft murmur, no rubs, or gallops Respiratory: Scattered dry rales bilaterally, normal work of breathing GI: soft, nontender, nondistended, + BS MS: no deformity or atrophy; no edema; distal pulses are 2+ in all 4 extremities  Skin: warm and dry, no rash Neuro:  Strength and sensation are intact Psych: euthymic mood, full affect   EKG:  EKG is ordered today. The ekg ordered today demonstrates Sinus brady, HR 58 Lateral T waves are different from 12/14/2017 but are like 07/22/2018  ECHO: 08/07/2018 - Left ventricle: Systolic function was normal. The estimated   ejection fraction was in the range of 55% to 60%. Wall motion was   normal; there were no regional wall motion abnormalities. Doppler   parameters are consistent with abnormal left ventricular   relaxation (grade 1 diastolic dysfunction).  Impressions:  - Left ventricular systolic function has further improved compared   to the previous studies.  CATH: 03/04/2015 ANGIOGRAPHY:  At the start of the procedure, left main was a large caliber vessel which trifurcated into the LAD,  intermediate-like vessel and the left circumflex coronary artery.  The ramus intermediate vessel had 60% ostial stenosis in some views eccentrically and then had a 99-100% subtotal stenosis proximally followed by another area of 95% stenosis.  There was very poor visualization beyond this.  Following successful intervention to the ramus vessel with PTCA, and ultimate stenting from the ostium to the mid vessel the entire stented region was reduced to 0%.  The ramus intermediate vessel is large caliber and extended to the apex and gave rise to several additional distal branches.  There was 30% narrowing beyond the branch after the stented segment  The left circumflex vessel was a dominant vessel that had 30% mild proximal narrowing.  In the distal aspect.  There was a bifurcation stenosis of 80-90% involving a smaller distal marginal branch as well as the distal AV groove into the PDA/PLA system which was large caliber.  Following complex intervention with double wire technique, Angiosculpt scoring balloon of both the ostium of the distal circumflex marginal and the the dominant distal circumflex into the PLA system, the large circumflex PLA 80% stenosis ultimately treated with a 2.7520 mm bare-metal Rebel stent postdilated with taper from 2.9.  A 2.8 mm was reduced to 0%. There was brisk TIMI-3 flow.  The distal circumflex marginal branch, which was jailed by the stented region and had undergone Angiosculpt scoring balloon and long PTCA with the 90% stenosis being reduced to 30-40% did have ostial re-narrowing following being jailed by the stented segment.    IMPRESSION:  Difficult but successful complex intervention involving PTCA/BM stenting of a subtotal ramus intermediate vessel with the entire proximal region being reduced to 0% in an intermediate vessel which extended to the LV apex once opened; and Angiosculpt/PTCA of bifurcation stenosis involving the distal circumflex flex marginal branch and  Angiosculpt/BM stenting of a dominant distal circumflex extending into the PD/PLA vessel.  Troy Sine, MD, [facility] 03/04/2015 11:21 AM         MYOVIEW: 07/27/2018 Rest Perfusion There is a defect present in the basal inferoseptal, basal inferior, mid inferior and apical inferior location.  Stress Perfusion There is a defect present  in the basal inferoseptal, basal inferior, mid inferior and apical inferior location.  Overall Study Impression Myocardial perfusion is abnormal. Decreased tracer activity in the inferior and basal inferoseptal wall consistent with possible scar and/or soft tissue attenuation No ischemia. This is an intermediate risk study. Overall left ventricular systolic function was abnormal. LV cavity size is normal. Nuclear stress EF: 39%. There are no significant changes in comparison to the prior study.      Recent Labs: 07/22/2018: B Natriuretic Peptide 60.5 12/14/2018: ALT 26; BUN 11; Creatinine, Ser 1.29; Hemoglobin 14.0; Platelets 144; Potassium 4.2; Sodium 140  CBC    Component Value Date/Time   WBC 3.1 (L) 12/14/2018 1036   RBC 4.93 12/14/2018 1036   HGB 14.0 12/14/2018 1036   HGB 13.7 04/25/2017 1310   HCT 44.8 12/14/2018 1036   HCT 41.3 04/25/2017 1310   PLT 144 (L) 12/14/2018 1036   PLT 163 04/25/2017 1310   MCV 90.9 12/14/2018 1036   MCV 90 04/25/2017 1310   MCH 28.4 12/14/2018 1036   MCHC 31.3 12/14/2018 1036   RDW 14.2 12/14/2018 1036   RDW 14.5 04/25/2017 1310   LYMPHSABS 1.3 12/14/2018 1036   LYMPHSABS 1.6 04/25/2017 1310   MONOABS 0.3 12/14/2018 1036   EOSABS 0.1 12/14/2018 1036   EOSABS 0.1 04/25/2017 1310   BASOSABS 0.0 12/14/2018 1036   BASOSABS 0.0 04/25/2017 1310   CMP Latest Ref Rng & Units 12/14/2018 07/22/2018 04/25/2017  Glucose 70 - 99 mg/dL 132(H) 114(H) 96  BUN 8 - 23 mg/dL 11 10 12   Creatinine 0.61 - 1.24 mg/dL 1.29(H) 1.27(H) 1.20  Sodium 135 - 145 mmol/L 140 139 142  Potassium 3.5 - 5.1 mmol/L 4.2 4.1 4.6  Chloride 98 -  111 mmol/L 113(H) 109 105  CO2 22 - 32 mmol/L 22 23 20   Calcium 8.9 - 10.3 mg/dL 8.9 8.9 9.2  Total Protein 6.5 - 8.1 g/dL 7.0 - -  Total Bilirubin 0.3 - 1.2 mg/dL 0.7 - -  Alkaline Phos 38 - 126 U/L 81 - -  AST 15 - 41 U/L 25 - -  ALT 0 - 44 U/L 26 - -     Lipid Panel Lab Results  Component Value Date   CHOL 115 07/23/2018   HDL 33 (L) 07/23/2018   LDLCALC 61 07/23/2018   TRIG 103 07/23/2018   CHOLHDL 3.5 07/23/2018      Wt Readings from Last 3 Encounters:  12/17/18 222 lb 3.2 oz (100.8 kg)  07/30/18 216 lb (98 kg)  07/27/18 210 lb (95.3 kg)     Other studies Reviewed: Additional studies/ records that were reviewed today include: Office notes, hospital records and testing.  ASSESSMENT AND PLAN:  1.  Palpitations: History of atrial fib with ablation 04/2017 - He was skipping beats and had PACs on his ECG today, he was aware of those but they did not reproduce the symptoms he has been having. - Discussed increasing his carvedilol.  I advised that his heart rate had been low and that was why the medication was originally decreased.  However, he is willing to watch for low heart rates and will contact us if he gets symptomatic bradycardia.  I believe his blood pressure will tolerate it. - Increase the carvedilol to 6.25 mg twice daily and he is to contact us if he does not tolerate it.  Go back to the lower dose if needed. - We will get a 30-day event monitor and have him follow-up with Dr. [name]  for this. -If his palpitations end up not being atrial fibrillation, change the appointment to Dr. Debara Pickett.  2.  History of PAF: He has the diltiazem to take as needed but has not taken it yet.  Therefore, there is no conflict between that and his amlodipine which he takes every day. - Continue Eliquis anticoagulation.  3.  CAD: He is not having any ischemic symptoms and has a good activity level.  Continue aspirin, high-dose Lipitor, beta-blocker, ARB  4.  Hypertension: His blood  pressure is on the low side of normal.  At 77, he may be getting symptoms from that. - Decrease the Lasix to every other day and decrease the irbesartan to 150 mg daily   5.  Chronic combined systolic and diastolic CHF: His volume status is good by exam, neck veins are up a little bit but oxygenation is good. His weight is up a little, but he feels that is from the holidays.  He is having no symptoms. -On his recent ER visit, his creatinine was above his baseline at 1.29. -Decrease the Lasix to 20 mg every other day and see if he can maintain his weight on this.  He understands that if he gets swelling in his legs or increased shortness of breath, he will need to go back to the previous dose.   Current medicines are reviewed at length with the patient today.  The patient does not have concerns regarding medicines.  The following changes have been made: Increase carvedilol, decrease Lasix and Avapro  Labs/ tests ordered today include:   Orders Placed This Encounter  Procedures  . Cardiac event monitor  . EKG 12-Lead     Disposition:   FU with [name], MD  Signed, [name], PA-C  12/17/2018 4:50 PM    [facility] Phone: [phone]; Fax: [phone]

## 2018-12-17 NOTE — Patient Instructions (Signed)
Medication Instructions:  Your physician has recommended you make the following change in your medication:  1) INCREASE Carvedilol to 6.25mg  twice daily 2) DECREASE Lasix to 20mg  everyother day 3) DECREASE Avapro to 1/2 tab 150mg  daily  If you need a refill on your cardiac medications before your next appointment, please call your pharmacy.   Lab work: None ordered If you have labs (blood work) drawn today and your tests are completely normal, you will receive your results only by: Marland Kitchen MyChart Message (if you have MyChart) OR . A paper copy in the mail If you have any lab test that is abnormal or we need to change your treatment, we will call you to review the results.  Testing/Procedures: Your physician has recommended that you wear an event monitor. Event monitors are medical devices that record the heart's electrical activity. Doctors most often [place] these monitors to diagnose arrhythmias. Arrhythmias are problems with the speed or rhythm of the heartbeat. The monitor is a small, portable device. You can wear one while you do your normal daily activities. This is usually used to diagnose what is causing palpitations/syncope (passing out).    Follow-Up: At [facility], you and your health needs are our priority.  As part of our continuing mission to provide you with exceptional heart care, we have created designated Provider Care Teams.  These Care Teams include your primary Cardiologist (physician) and Advanced Practice Providers (APPs -  Physician Assistants and Nurse Practitioners) who all work together to provide you with the care you need, when you need it. . Your physician recommends that you schedule a follow-up appointment in: 6 weeks with EP Dr.Camnitz . Follow as planned with Dr.Hilty  Any Other Special Instructions Will Be Listed Below (If Applicable). If your Heartrate drops below 50bpm it is ok to go back to the lower dose of Carvedilol 3.125mg  twice daily. Please call the  office to give an update

## 2018-12-25 ENCOUNTER — Ambulatory Visit (INDEPENDENT_AMBULATORY_CARE_PROVIDER_SITE_OTHER): Payer: Medicare Other

## 2018-12-25 ENCOUNTER — Telehealth: Payer: Self-pay | Admitting: Physician Assistant

## 2018-12-25 DIAGNOSIS — I480 Paroxysmal atrial fibrillation: Secondary | ICD-10-CM | POA: Diagnosis not present

## 2018-12-25 DIAGNOSIS — R002 Palpitations: Secondary | ICD-10-CM | POA: Diagnosis not present

## 2018-12-25 NOTE — Telephone Encounter (Signed)
Paged by Kathlee Nations of [employer] [phone]), Mr. Rodney [name] had a 6 sec of NSVT around 2:21 PM EST today. Patient's only complaint of fatigue. Otherwise, no chest pain or dizziness or presyncope. His current BP 144/84, HR 70s. His Coreg was recently increased to 6.25mg  BID. Will further increase coreg to 9.375 mg BID. Will forward to [name] who ordered the monitor to review.   Hilbert Corrigan PA Pager: [phone]

## 2018-12-27 NOTE — Telephone Encounter (Signed)
  As he has not had syncope, at ER visit K+ was 4.2, Mg not checked, think ok to increase BB and follow. Thanks

## 2019-01-03 ENCOUNTER — Telehealth: Payer: Self-pay | Admitting: Physician Assistant

## 2019-01-03 NOTE — Telephone Encounter (Signed)
Noted  

## 2019-01-03 NOTE — Telephone Encounter (Signed)
Pt states he is having cataract surgery and needing surgical clearance. Pt states he has the information from the the requesting provider along with the number to fax clearance. Pt states he will drop paper off tomorrow. Pt also states he singed a medical release form stating all future medical information (OV, lab work, testing etc.) be faxed to [place]. Advised will make Dr. Lysbeth Penner nurse aware.

## 2019-01-03 NOTE — Telephone Encounter (Signed)
New Message:    Please call, pt wants to talk to you about getting medical clearance. I told pt that it needs to be from doctor office to doctor office or faxed.

## 2019-01-04 ENCOUNTER — Telehealth: Payer: Self-pay | Admitting: *Deleted

## 2019-01-04 NOTE — Telephone Encounter (Signed)
   [facility] Pre-operative Risk Assessment    Request for surgical clearance:  1. What type of surgery is being performed? CATARACT SURGERY   2. When is this surgery scheduled? TBD   3. What type of clearance is required (medical clearance vs. Pharmacy clearance to hold med vs. Both)?    4. Are there any medications that need to be held prior to surgery and how long?   5. Practice name and name of physician performing surgery? [facility]    6. What is your office phone number    7.   What is your office fax number? [phone]  8.   Anesthesia type (None, local, MAC, general) ?

## 2019-01-07 NOTE — Telephone Encounter (Signed)
   Primary Cardiologist: [name], MD  Chart reviewed as part of pre-operative protocol coverage. Cataract extractions are recognized in guidelines as low risk surgeries that do not typically require specific preoperative testing or holding of blood thinner therapy. Therefore, given past medical history and time since last visit, based on ACC/AHA guidelines, Rodney [name] would be at acceptable risk for the planned procedure without further cardiovascular testing.   I will route this recommendation to the requesting party via Epic fax function and remove from pre-op pool.  Please call with questions.  Abigail Butts, PA-C 01/07/2019, 9:53 AM

## 2019-02-04 ENCOUNTER — Ambulatory Visit: Payer: Medicare Other | Admitting: Cardiology

## 2019-02-04 ENCOUNTER — Encounter: Payer: Self-pay | Admitting: Cardiology

## 2019-02-04 VITALS — BP 130/80 | HR 64 | Ht 74.0 in | Wt 224.0 lb

## 2019-02-04 DIAGNOSIS — I480 Paroxysmal atrial fibrillation: Secondary | ICD-10-CM

## 2019-02-04 NOTE — Progress Notes (Signed)
Electrophysiology Office Note   Date:  02/04/2019   ID:  Rodney, [name] 09-06-1941, MRN [id number]  PCP:  [name], MD  Cardiologist:  Debara Pickett Primary Electrophysiologist:  Will Meredith Leeds, MD    No chief complaint on file.    History of Present Illness: Rodney [name] is a 78 y.o. male who is being seen today for the evaluation of atrial fibrillation at the request of [name], MD. Presenting today for electrophysiology evaluation. He has a history of AAA, coronary artery disease status post stent to the ramus and circumflex, hypertension, hyperlipidemia, OSA, and paroxysmal atrial fibrillation. He was hospitalized for atrial fibrillation at the beginning of April. He had previously been on dofetilide, which was stopped. Had AF ablation 04/28/17.  Today, denies symptoms of chest pain, shortness of breath, orthopnea, PND, lower extremity edema, claudication, dizziness, presyncope, syncope, bleeding, or neurologic sequela. The patient is tolerating medications without difficulties.  He was previously having palpitations.  He wore a cardiac monitor that showed episodes of narrow complex tachycardia as well as possibly atrial fibrillation.  His carvedilol dose was increased and he is felt much improved since that time.   Past Medical History:  Diagnosis Date  . AAA (abdominal aortic aneurysm) ([place])    stent graft put in 02/2009  . Arthritis   . CAD (coronary artery disease)    a. cath 2010 b. CAD s/p PCI to ramus and LCX on 03/04/15  . Gout   . Hyperlipidemia   . Hypertension   . OSA (obstructive sleep apnea) 09/30/2015   Very mild with AHI 6.2/hr  . Paroxysmal atrial fibrillation ([facility])   . Tobacco abuse    Past Surgical History:  Procedure Laterality Date  . ABDOMINAL AORTIC ANEURYSM REPAIR  02/19/2009   performed by [facility]  . ATRIAL FIBRILLATION ABLATION N/A 04/28/2017   Procedure: Atrial Fibrillation Ablation;  Surgeon: Constance Haw, MD;  Location: [facility];  Service: Cardiovascular;  Laterality: N/A;  . COLONOSCOPY    . KNEE ARTHROSCOPY  2003   left  . LEFT AND RIGHT HEART CATHETERIZATION WITH CORONARY ANGIOGRAM N/A 03/03/2015   Procedure: LEFT AND RIGHT HEART CATHETERIZATION WITH CORONARY ANGIOGRAM;  Surgeon: Troy Sine, MD;    . PERCUTANEOUS CORONARY STENT INTERVENTION (PCI-S) N/A 03/04/2015   Procedure: PERCUTANEOUS CORONARY STENT INTERVENTION (PCI-S);  Surgeon: Troy Sine, MD; RI 99>>0% w/   BMS, bifurcation CFX-OM 90>>40% w/ Angiosculpt PTCA; dCFX 90>>0% w/   2.7520 mm Rebel BMS  . SHOULDER ARTHROSCOPY WITH ROTATOR CUFF REPAIR AND SUBACROMIAL DECOMPRESSION Left 11/19/2013   Procedure: LEFT SHOULDER ARTHROSCOPY DEBRIDEMENT EXTENTSIVE DISTAL CLAVICULECTOMY DECOMPRESSION PARTIAL ACROMIOPLASTY WITH CORACOACROMIAL WITH ROTATOR CUFF REPAIR ;  Surgeon: Renette Butters, MD;  Location: [facility];  Service: Orthopedics;  Laterality: Left;     Current Outpatient Medications  Medication Sig Dispense Refill  . carvedilol (COREG) 6.25 MG tablet Take 1 tablet (6.25 mg total) by mouth 2 (two) times daily. (Patient taking differently: Take 9.375 mg by mouth 2 (two) times daily. ) 60 tablet 5  . amLODipine (NORVASC) 5 MG tablet Take 1 tablet (5 mg total) daily by mouth. 90 tablet 3  . apixaban (ELIQUIS) 5 MG TABS tablet Take 1 tablet (5 mg total) by mouth 2 (two) times daily. 180 tablet 3  . aspirin EC 81 MG tablet Take 81 mg by mouth daily.    Marland Kitchen atorvastatin (LIPITOR) 80 MG tablet Take 1 tablet (80 mg total) by  mouth daily. (Patient taking differently: Take 80 mg by mouth at bedtime. ) 90 tablet 3  . diltiazem (CARDIZEM) 30 MG tablet TAKE 1 TABLET EVERY 4 HOURS AS NEEDED FOR IRREGULAR HR>100 AND BP>100 (Patient taking differently: Take 30 mg by mouth every 4 (four) hours as needed. Take 1 tablet every 4 hours AS NEEDED for irregular HR>100 and BP>100) 45 tablet 2  . famotidine (PEPCID) 40 MG tablet Take 1 tablet (40 mg  total) by mouth 2 (two) times daily. 180 tablet 1  . fish oil-omega-3 fatty acids 1000 MG capsule Take 1,000 mg by mouth daily.     . furosemide (LASIX) 20 MG tablet Take 1 tablet (20 mg total) by mouth every other day.    . irbesartan (AVAPRO) 300 MG tablet Take 0.5 tablets (150 mg total) by mouth daily.    . isosorbide mononitrate (IMDUR) 60 MG 24 hr tablet TAKE 1 TABLET BY MOUTH EVERY DAY ( CAN NOT BE FILL 06.04.2018) (Patient taking differently: Take 60 mg by mouth daily. ) 90 tablet 2  . KLOR-CON M10 10 MEQ tablet TAKE 1 TABLET BY MOUTH TWICE A DAY 180 tablet 3  . loratadine (CLARITIN) 10 MG tablet Take 10 mg by mouth daily as needed (seasonal allergies).     . Multiple Vitamin (MULTIVITAMIN WITH MINERALS) TABS tablet Take 1 tablet by mouth daily.    . nitroGLYCERIN (NITROSTAT) 0.4 MG SL tablet Place 1 tablet (0.4 mg total) under the tongue every 5 (five) minutes as needed for chest pain (shortness of breath). 30 tablet 2   No current facility-administered medications for this visit.     Allergies:   Lisinopril and Iohexol   Social History:  The patient  reports that he quit smoking about 2 years ago. His smoking use included cigarettes. He has a 25.00 pack-year smoking history. He has never used smokeless tobacco. He reports current alcohol use of about 7.0 - 8.0 standard drinks of alcohol per week. He reports that he does not use drugs.   Family History:  The patient's family history includes Diabetes in his sister; Hypertension in his mother and sister; Prostate cancer in his father.   ROS:  Please see the history of present illness.   Otherwise, review of systems is positive for visual changes, cough.   All other systems are reviewed and negative.   PHYSICAL EXAM: VS:  BP 130/80   Pulse 64   Ht 6\' 2"  (1.88 m)   Wt 224 lb (101.6 kg)   BMI 28.76 kg/m  , BMI Body mass index is 28.76 kg/m. GEN: Well nourished, well developed, in no acute distress  HEENT: normal  Neck: no JVD,  carotid bruits, or masses Cardiac: RRR; no murmurs, rubs, or gallops,no edema  Respiratory:  clear to auscultation bilaterally, normal work of breathing GI: soft, nontender, nondistended, + BS MS: no deformity or atrophy  Skin: warm and dry Neuro:  Strength and sensation are intact Psych: euthymic mood, full affect  EKG:  EKG is ordered today. Personal review of the ekg ordered shows sinus rhythm, PACs, inferior T wave inversions  Recent Labs: 07/22/2018: B Natriuretic Peptide 60.5 12/14/2018: ALT 26; BUN 11; Creatinine, Ser 1.29; Hemoglobin 14.0; Platelets 144; Potassium 4.2; Sodium 140    Lipid Panel     Component Value Date/Time   CHOL 115 07/23/2018 0008   TRIG 103 07/23/2018 0008   HDL 33 (L) 07/23/2018 0008   CHOLHDL 3.5 07/23/2018 0008   VLDL 21 07/23/2018 0008  Dent 61 07/23/2018 0008     Wt Readings from Last 3 Encounters:  02/04/19 224 lb (101.6 kg)  12/17/18 222 lb 3.2 oz (100.8 kg)  07/30/18 216 lb (98 kg)      Other studies Reviewed: Additional studies/ records that were reviewed today include: TTE 08/07/18 Review of the above records today demonstrates:  - Left ventricle: Systolic function was normal. The estimated   ejection fraction was in the range of 55% to 60%. Wall motion was   normal; there were no regional wall motion abnormalities. Doppler   parameters are consistent with abnormal left ventricular   relaxation (grade 1 diastolic dysfunction).  Telemetry 12/25/18 - personally reviewed Paroxysmal narrow complex tachycardia, suspect PAF (although labeled NSVT). Recommend EP follow-up.  ASSESSMENT AND PLAN:  1.  Paroxysmal atrial fibrillation: Currently on Eliquis.  He is status post atrial fibrillation ablation 04/28/2017.  His amiodarone has been stopped due to fatigue.  He is continued to have short episodes of atrial fibrillation over the past few months diagnosed on cardiac monitor.  His carvedilol dose has been increased which greatly improved  his symptoms.  No changes at this time.    This patients CHA2DS2-VASc Score and unadjusted Ischemic Stroke Rate (% per year) is equal to 4.8 % stroke rate/year from a score of 4  Above score calculated as 1 point each if present [CHF, HTN, DM, Vascular=MI/PAD/Aortic Plaque, Age if 65-74, or Male] Above score calculated as 2 points each if present [Age > 75, or Stroke/TIA/TE]   2. Coronary artery disease: Status post circumflex PCI.  No current changes without chest pain at this time.  3. Essential hypertension: Well-controlled today  4. Ischemic cardiomyopathy: *Ejection fraction improved to 55 to 60%.  No changes.     Current medicines are reviewed at length with the patient today.   The patient does not have concerns regarding his medicines.  The following changes were made today: None  Labs/ tests ordered today include:  Orders Placed This Encounter  Procedures  . EKG 12-Lead   Case discussed with primary cardiology  Disposition:   FU with Will Camnitz 6 months  Signed, Will Meredith Leeds, MD  02/04/2019 10:00 AM     [facility] [address] [place] [address] [phone] (office) [phone] (fax)

## 2019-02-04 NOTE — Patient Instructions (Addendum)
Medication Instructions:  Your physician recommends that you continue on your current medications as directed. Please refer to the Current Medication list given to you today.  * If you need a refill on your cardiac medications before your next appointment, please call your pharmacy.   Labwork: None ordered  Testing/Procedures: None ordered  Follow-Up: Your physician recommends that you schedule a follow-up appointment in 3 months with Dr. Debara Pickett.  Your physician wants you to follow-up in: 6 months with Dr. [name].  You will receive a reminder letter in the mail two months in advance. If you don't receive a letter, please call our office to schedule the follow-up appointment.  Thank you for choosing [facility]!!   Trinidad Curet, RN [phone]

## 2019-03-02 IMAGING — DX DG CHEST 2V
2 series · 2 of 2 positions shown · non-contrast
Comparison: Chest radiograph performed 10/02/2016

CLINICAL DATA: Acute onset of generalized chest discomfort. Initial
encounter.

EXAM:
CHEST  2 VIEW

[w chest pa]
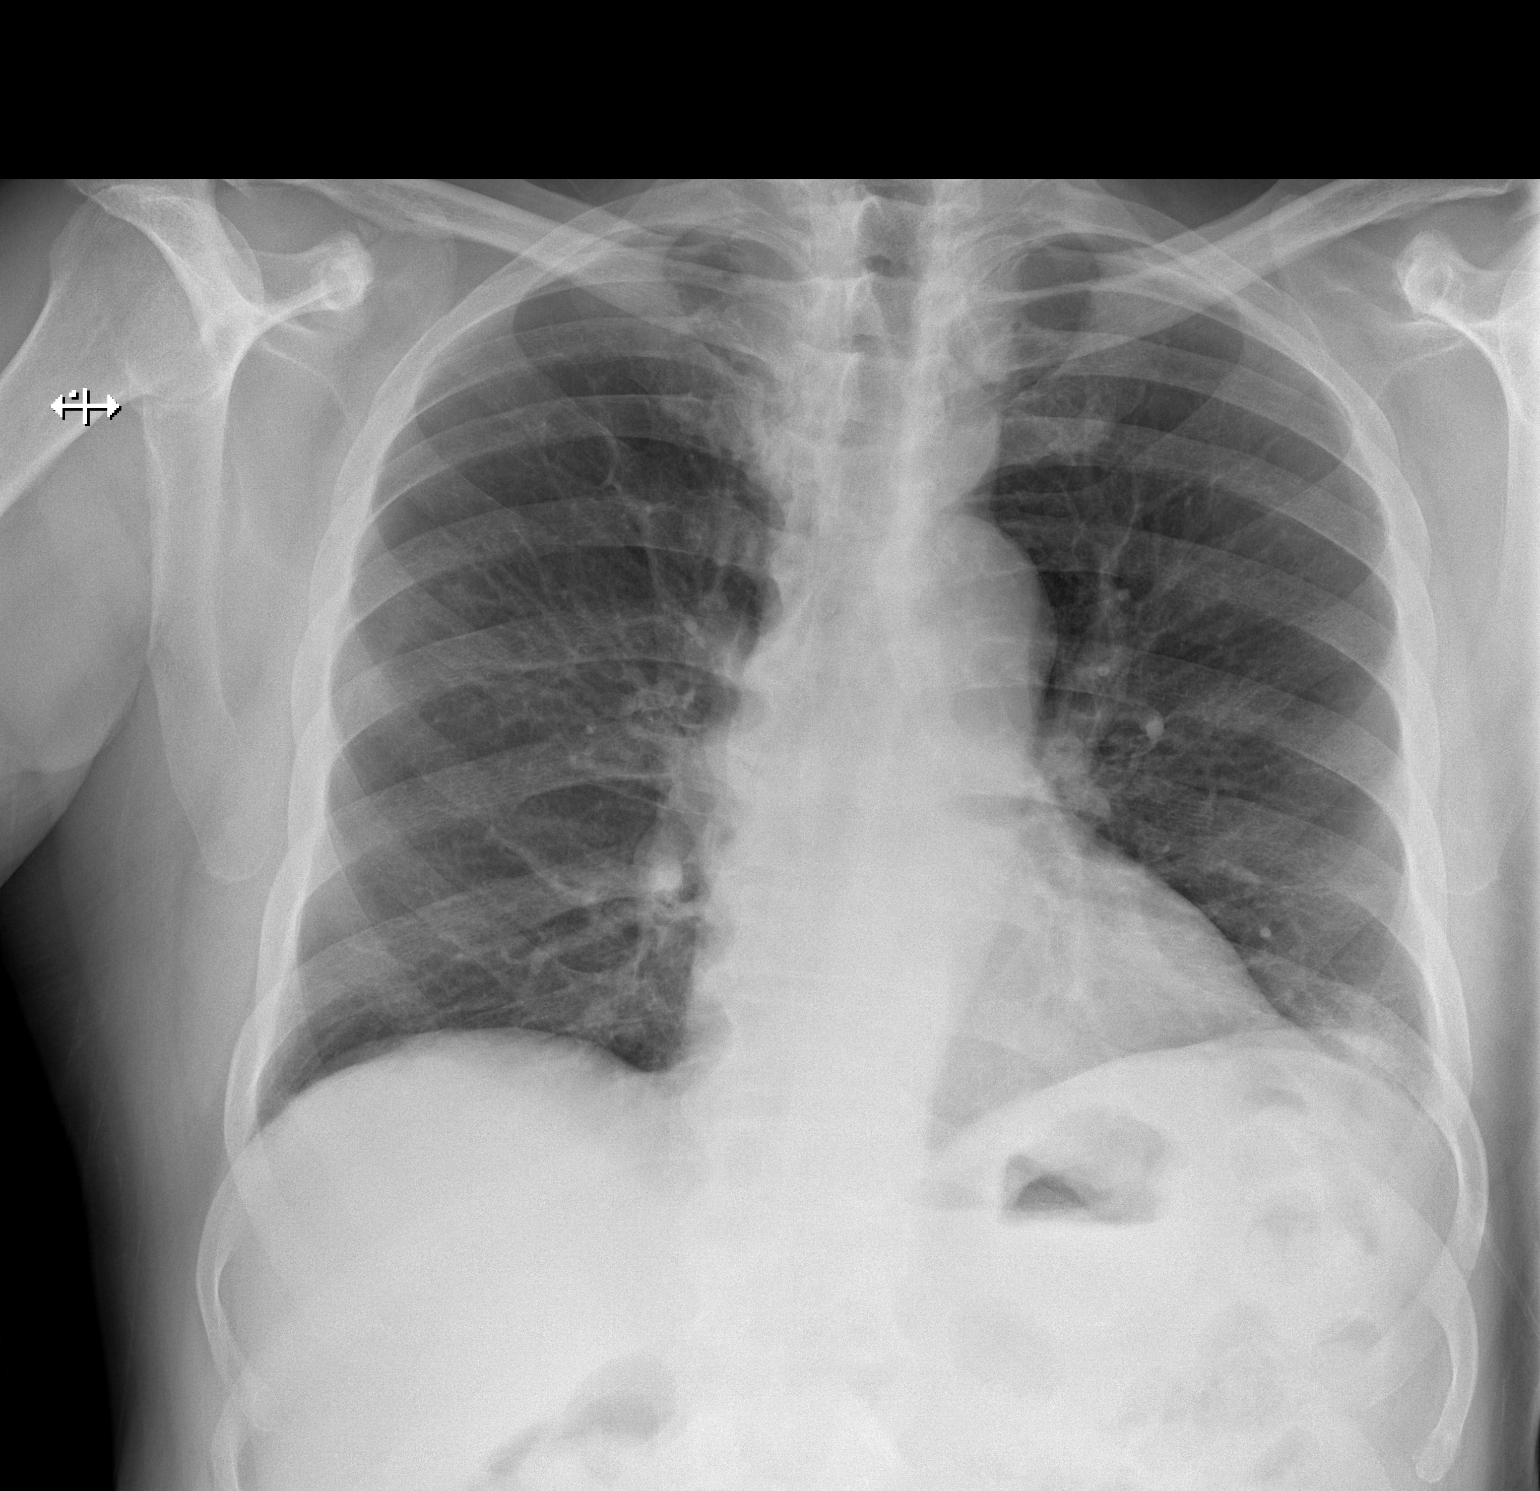

[w chest lat]
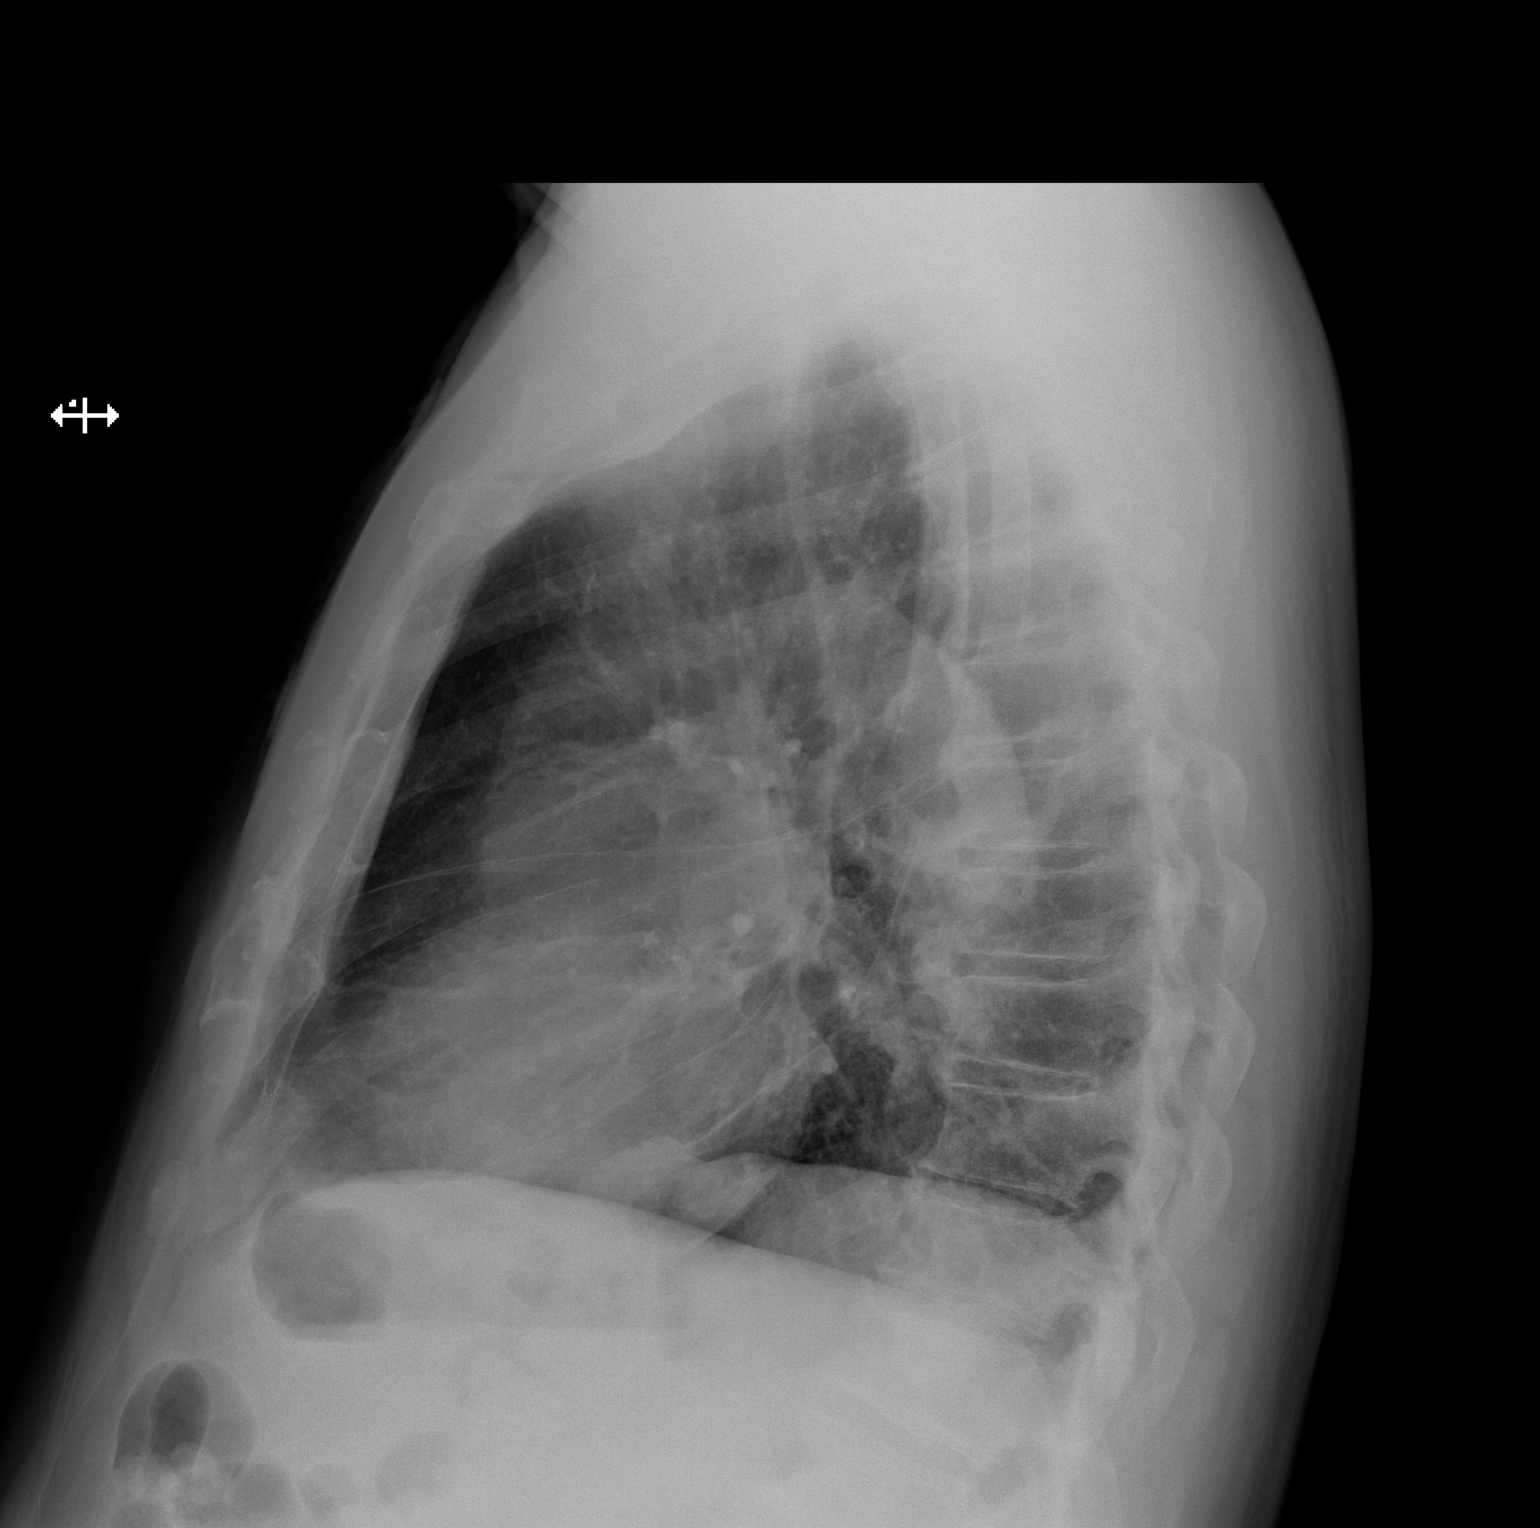

[2 of 2 positions shown; findings below may reference images not displayed]

FINDINGS: The lungs are well-aerated. Mild left basilar opacity likely
reflects atelectasis or scarring. There is no evidence of pleural
effusion or pneumothorax.

The heart is normal in size; the mediastinal contour is within
normal limits. No acute osseous abnormalities are seen.
IMPRESSION: Mild left basilar airspace opacity likely reflects atelectasis or
scarring. Lungs otherwise clear.

## 2019-03-09 IMAGING — DX DG CHEST 2V
2 series · 2 of 2 positions shown · non-contrast
Comparison: 03/11/2017, 10/02/2016

CLINICAL DATA: 75-year-old male with history of atrial fibrillation

EXAM:
CHEST  2 VIEW

[w chest pa]
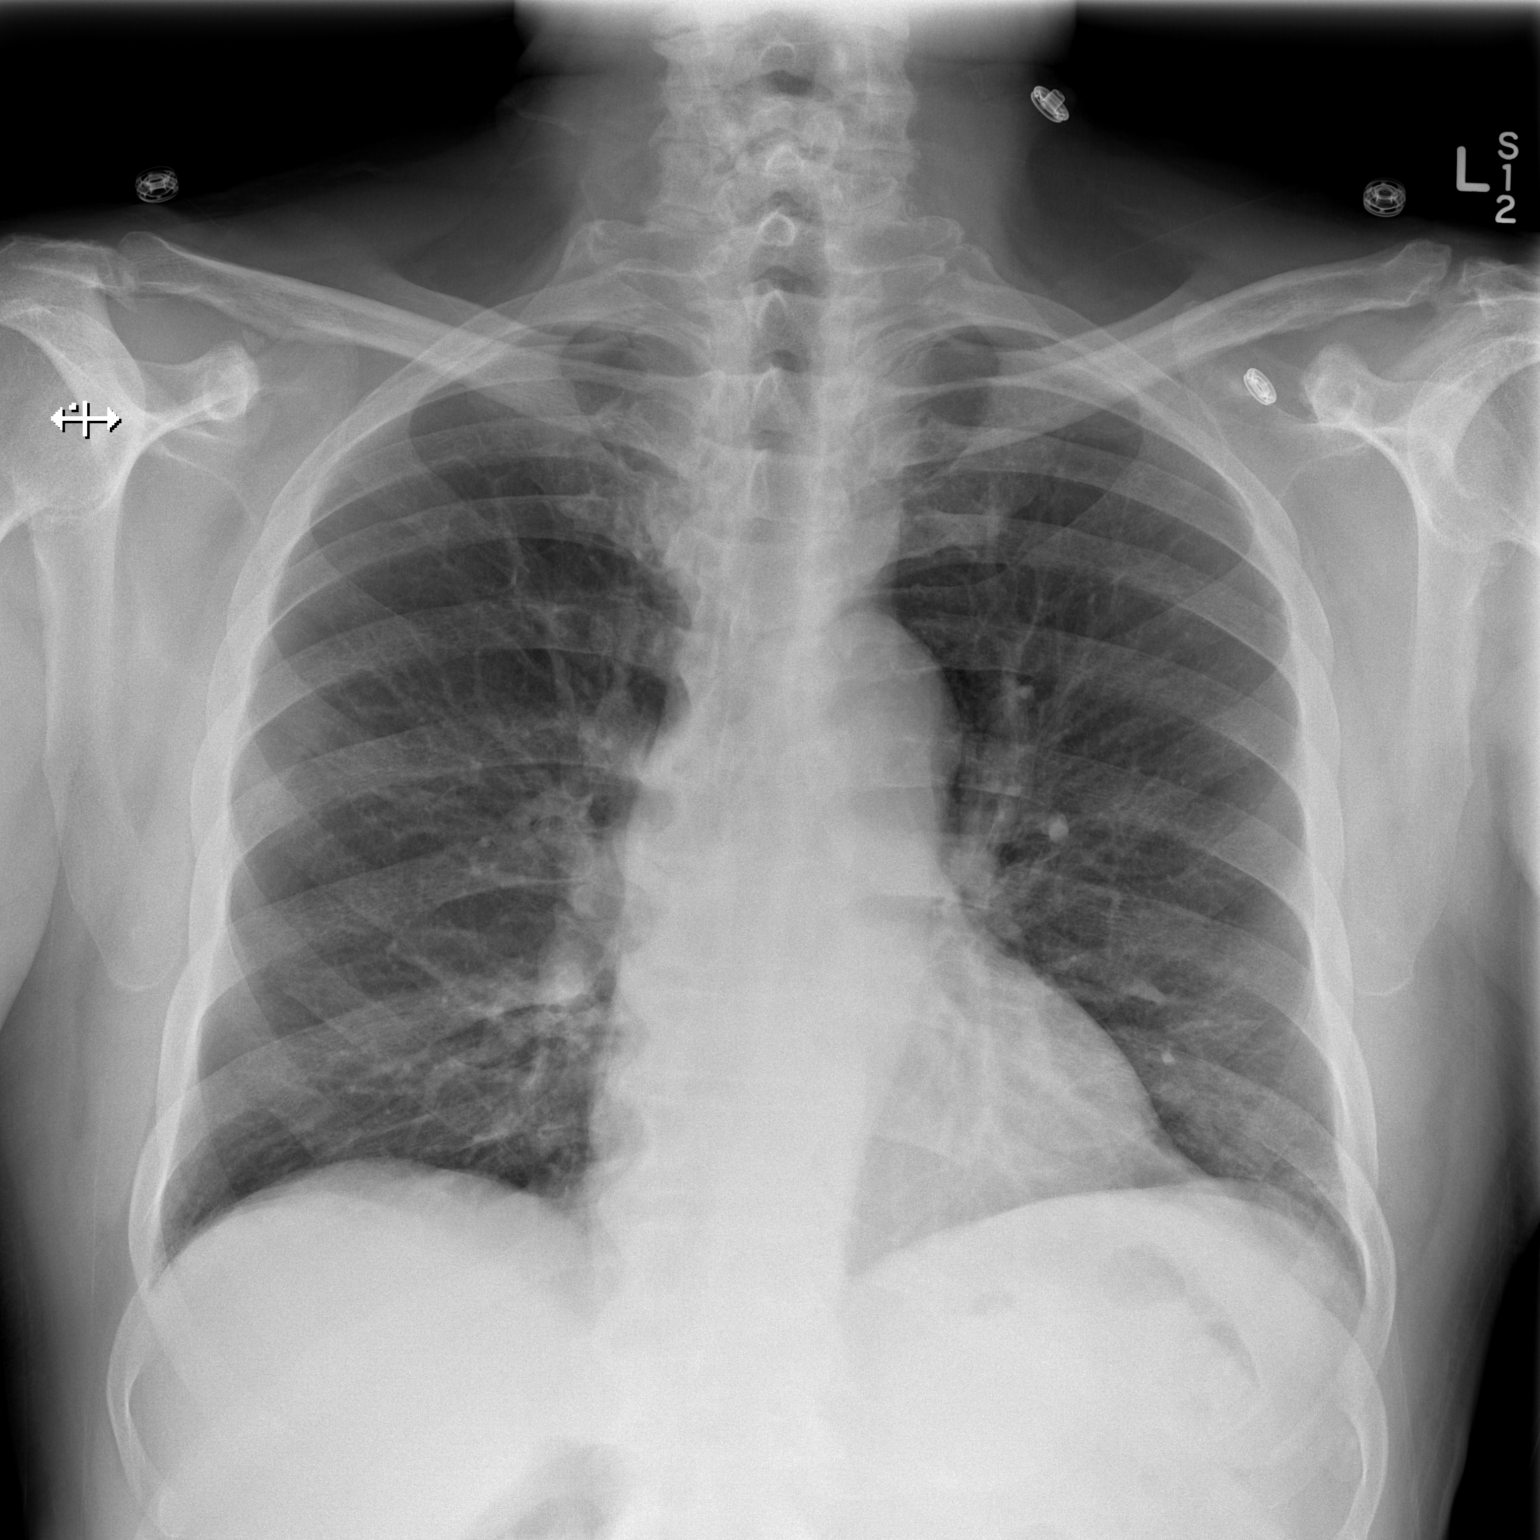

[w chest lat]
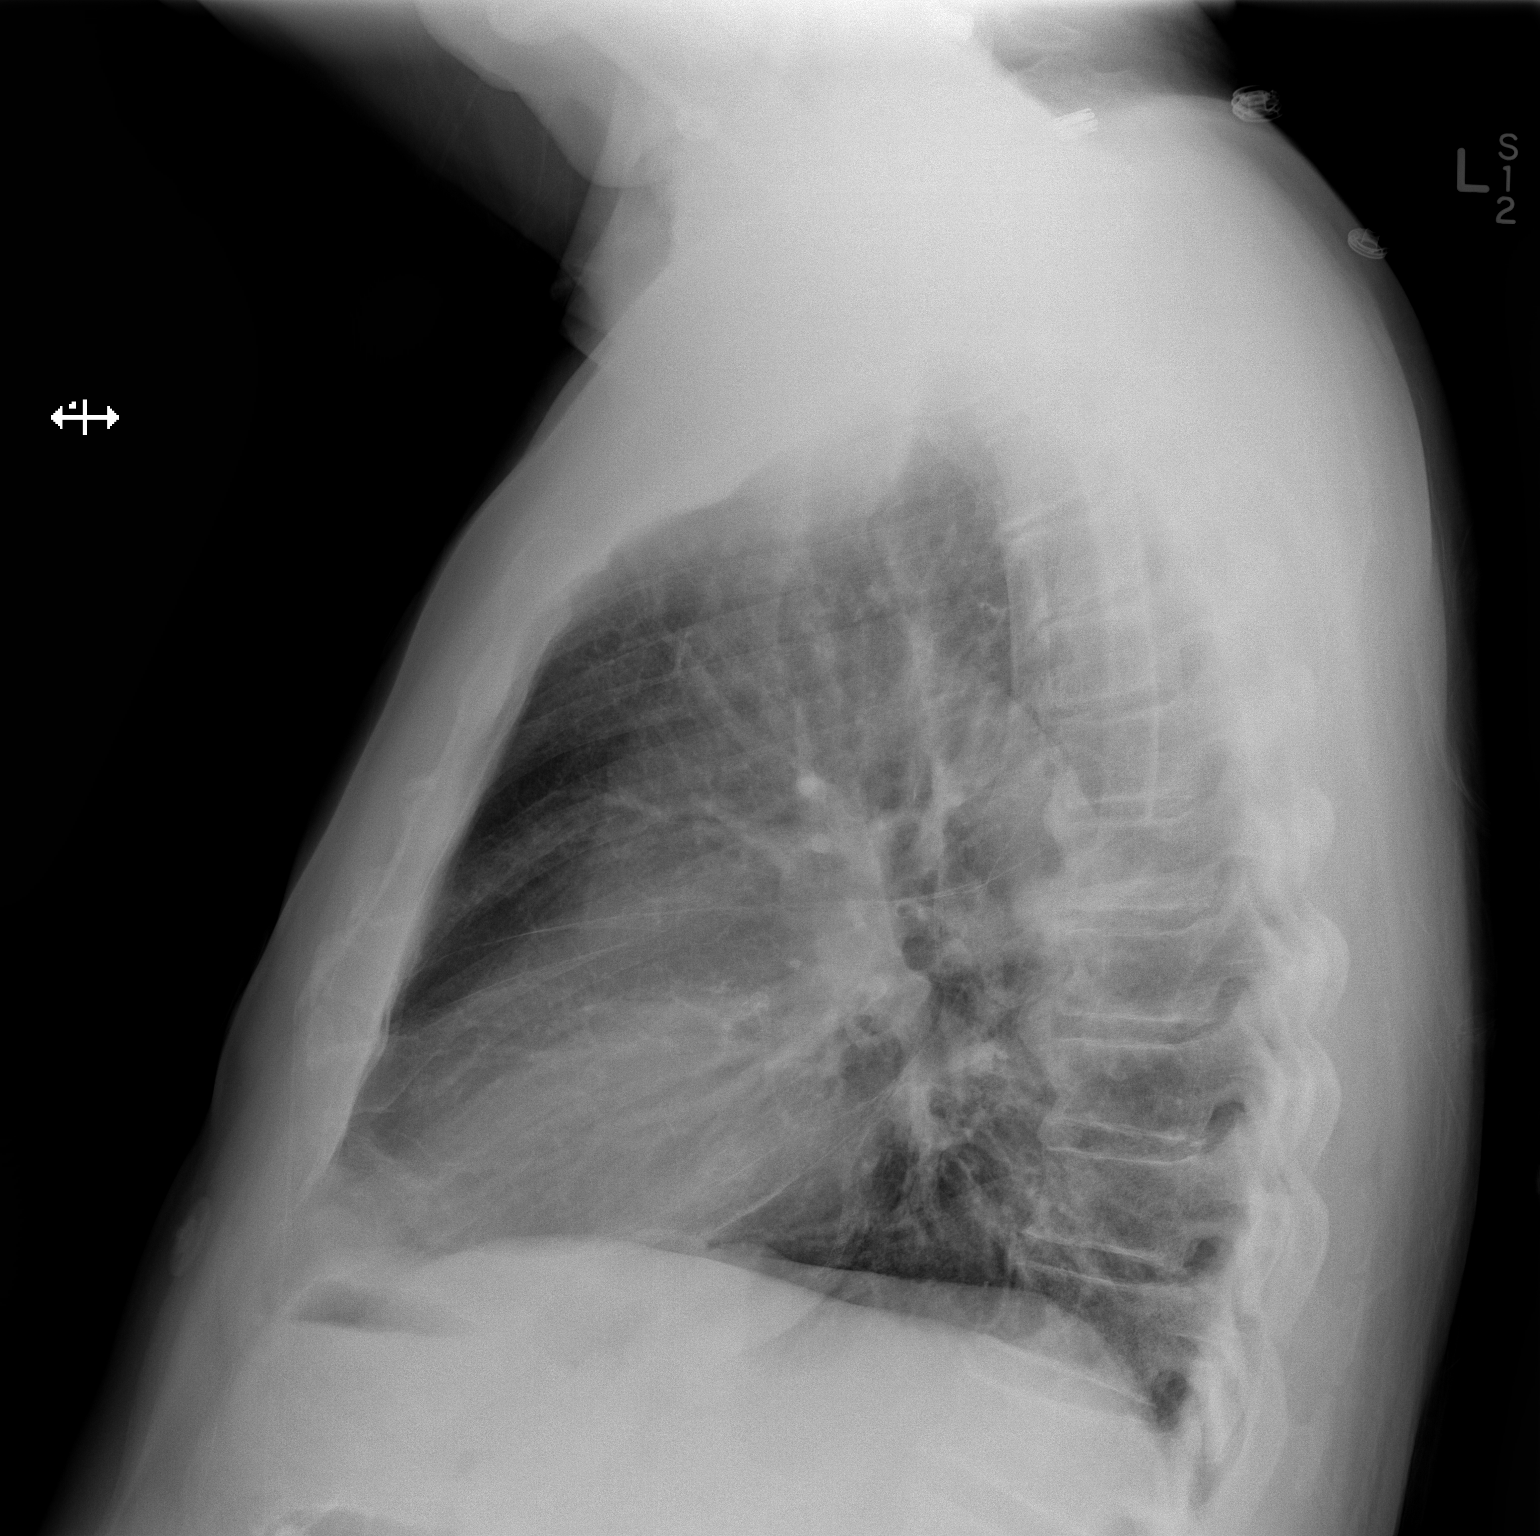

[2 of 2 positions shown; findings below may reference images not displayed]

FINDINGS: Cardiomediastinal silhouette unchanged.

No pneumothorax or pleural effusion.

Coarsened interstitial markings, similar to comparison studies.

No confluent airspace disease.

No displaced fracture
IMPRESSION: Background of chronic lung changes without evidence of superimposed
acute cardiopulmonary disease

## 2019-05-01 ENCOUNTER — Telehealth: Payer: Self-pay

## 2019-05-01 ENCOUNTER — Telehealth: Payer: Self-pay | Admitting: Internal Medicine

## 2019-05-01 MED ORDER — NITROGLYCERIN 0.4 MG SL SUBL: 0.4000 mg | SUBLINGUAL_TABLET | SUBLINGUAL | 3 refills | Status: DC | PRN

## 2019-05-01 MED ORDER — FAMOTIDINE 40 MG PO TABS: 40.0000 mg | ORAL_TABLET | Freq: Two times a day (BID) | ORAL | 2 refills | Status: AC

## 2019-05-01 MED ORDER — DILTIAZEM HCL 30 MG PO TABS: 30.0000 mg | ORAL_TABLET | Freq: Four times a day (QID) | ORAL | 2 refills | Status: DC | PRN

## 2019-05-01 MED ORDER — FUROSEMIDE 20 MG PO TABS: 20.0000 mg | ORAL_TABLET | ORAL | 2 refills | Status: DC

## 2019-05-01 MED ORDER — IRBESARTAN 300 MG PO TABS: 150.0000 mg | ORAL_TABLET | Freq: Every day | ORAL | 2 refills | Status: DC

## 2019-05-01 MED ORDER — CARVEDILOL 6.25 MG PO TABS: 9.3750 mg | ORAL_TABLET | Freq: Two times a day (BID) | ORAL | 2 refills | Status: DC

## 2019-05-01 NOTE — Telephone Encounter (Signed)
Smartphone/ consent/ my chart declined/ pre reg completed  °

## 2019-05-01 NOTE — Telephone Encounter (Signed)
Virtual Visit Pre-Appointment Phone Call  "(Name), I am calling you today to discuss your upcoming appointment. We are currently trying to limit exposure to the virus that causes COVID-19 by seeing patients at home rather than in the office."  1. "What is the BEST phone number to call the day of the visit?" - include this in appointment notes  2. "Do you have or have access to (through a family member/friend) a smartphone with video capability that we can use for your visit?" a. If yes - list this number in appt notes as "cell" (if different from BEST phone #) and list the appointment type as a VIDEO visit in appointment notes b. If no - list the appointment type as a PHONE visit in appointment notes  3. Confirm consent - "In the setting of the current Covid19 crisis, you are scheduled for a (phone or video) visit with your provider on (date) at (time).  Just as we do with many in-office visits, in order for you to participate in this visit, we must obtain consent.  If you'd like, I can send this to your mychart (if signed up) or email for you to review.  Otherwise, I can obtain your verbal consent now.  All virtual visits are billed to your insurance company just like a normal visit would be.  By agreeing to a virtual visit, we'd like you to understand that the technology does not allow for your provider to perform an examination, and thus may limit your provider's ability to fully assess your condition. If your provider identifies any concerns that need to be evaluated in person, we will make arrangements to do so.  Finally, though the technology is pretty good, we cannot assure that it will always work on either your or our end, and in the setting of a video visit, we may have to convert it to a phone-only visit.  In either situation, we cannot ensure that we have a secure connection.  Are you willing to proceed?" STAFF: Did the patient verbally acknowledge consent to telehealth visit? Document  YES/NO here: consent obtained today, 05/01/19, by Melton Krebs  4. Advise patient to be prepared - "Two hours prior to your appointment, go ahead and check your blood pressure, pulse, oxygen saturation, and your weight (if you have the equipment to check those) and write them all down. When your visit starts, your provider will ask you for this information. If you have an Apple Watch or Kardia device, please plan to have heart rate information ready on the day of your appointment. Please have a pen and paper handy nearby the day of the visit as well."  5. Give patient instructions for MyChart download to smartphone OR Doximity/[email or web address] as below if video visit (depending on what platform provider is using)  6. Inform patient they will receive a phone call 15 minutes prior to their appointment time (may be from unknown caller ID) so they should be prepared to answer    TELEPHONE CALL NOTE  [name] has been deemed a candidate for a follow-up tele-health visit to limit community exposure during the Covid-19 pandemic. I spoke with the patient via phone to ensure availability of phone/video source, confirm preferred email & phone number, and discuss instructions and expectations.  I reminded [name] to be prepared with any vital sign and/or heart rhythm information that could potentially be obtained via home monitoring, at the time of his visit. I reminded [name] to  expect a phone call prior to his visit.  Donivan Scull, [place] 05/01/2019 2:04 PM   INSTRUCTIONS FOR DOWNLOADING THE MYCHART APP TO SMARTPHONE  - The patient must first make sure to have activated MyChart and know their login information - If Apple, go to [employer] and type in MyChart in the search bar and download the app. If Android, ask patient to go to [employer] and type in [place] in the search bar and download the app. The app is free but as with any other app downloads, their phone may require them to  verify saved payment information or Apple/Android password.  - The patient will need to then log into the app with their MyChart username and password, and select [facility] as their healthcare provider to link the account. When it is time for your visit, go to the MyChart app, find appointments, and click Begin Video Visit. Be sure to Select Allow for your device to access the Microphone and Camera for your visit. You will then be connected, and your provider will be with you shortly.  **If they have any issues connecting, or need assistance please contact MyChart service desk [phone])**  **If using a computer, in order to ensure the best quality for their visit they will need to use either of the following Internet Browsers: [employer], or Google Chrome**  IF USING DOXIMITY or DOXY.ME - The patient will receive a link just prior to their visit by text.     FULL LENGTH CONSENT FOR TELE-HEALTH VISIT   I hereby voluntarily request, consent and authorize [facility] and its employed or contracted physicians, physician assistants, nurse practitioners or other licensed health care professionals (the Practitioner), to provide me with telemedicine health care services (the "Services") as deemed necessary by the treating Practitioner. I acknowledge and consent to receive the Services by the Practitioner via telemedicine. I understand that the telemedicine visit will involve communicating with the Practitioner through live audiovisual communication technology and the disclosure of certain medical information by electronic transmission. I acknowledge that I have been given the opportunity to request an in-person assessment or other available alternative prior to the telemedicine visit and am voluntarily participating in the telemedicine visit.  I understand that I have the right to withhold or withdraw my consent to the use of telemedicine in the course of my care at any time,  without affecting my right to future care or treatment, and that the Practitioner or I may terminate the telemedicine visit at any time. I understand that I have the right to inspect all information obtained and/or recorded in the course of the telemedicine visit and may receive copies of available information for a reasonable fee.  I understand that some of the potential risks of receiving the Services via telemedicine include:  Marland Kitchen Delay or interruption in medical evaluation due to technological equipment failure or disruption; . Information transmitted may not be sufficient (e.g. poor resolution of images) to allow for appropriate medical decision making by the Practitioner; and/or  . In rare instances, security protocols could fail, causing a breach of personal health information.  Furthermore, I acknowledge that it is my responsibility to provide information about my medical history, conditions and care that is complete and accurate to the best of my ability. I acknowledge that Practitioner's advice, recommendations, and/or decision may be based on factors not within their control, such as incomplete or inaccurate data provided by me or distortions of diagnostic images or specimens that  may result from electronic transmissions. I understand that the practice of medicine is not an exact science and that Practitioner makes no warranties or guarantees regarding treatment outcomes. I acknowledge that I will receive a copy of this consent concurrently upon execution via email to the email address I last provided but may also request a printed copy by calling the office of [facility].    I understand that my insurance will be billed for this visit.   I have read or had this consent read to me. . I understand the contents of this consent, which adequately explains the benefits and risks of the Services being provided via telemedicine.  . I have been provided ample opportunity to ask questions regarding  this consent and the Services and have had my questions answered to my satisfaction. . I give my informed consent for the services to be provided through the use of telemedicine in my medical care  By participating in this telemedicine visit I agree to the above.

## 2019-05-03 ENCOUNTER — Other Ambulatory Visit: Payer: Self-pay

## 2019-05-03 ENCOUNTER — Encounter: Payer: Self-pay | Admitting: Internal Medicine

## 2019-05-03 ENCOUNTER — Telehealth (INDEPENDENT_AMBULATORY_CARE_PROVIDER_SITE_OTHER): Payer: Medicare Other | Admitting: Internal Medicine

## 2019-05-03 VITALS — BP 113/72 | HR 61 | Ht 74.0 in | Wt 221.2 lb

## 2019-05-03 DIAGNOSIS — I255 Ischemic cardiomyopathy: Secondary | ICD-10-CM

## 2019-05-03 DIAGNOSIS — I2510 Atherosclerotic heart disease of native coronary artery without angina pectoris: Secondary | ICD-10-CM

## 2019-05-03 DIAGNOSIS — I480 Paroxysmal atrial fibrillation: Secondary | ICD-10-CM | POA: Diagnosis not present

## 2019-05-03 DIAGNOSIS — Z79899 Other long term (current) drug therapy: Secondary | ICD-10-CM

## 2019-05-03 DIAGNOSIS — Z5181 Encounter for therapeutic drug level monitoring: Secondary | ICD-10-CM | POA: Diagnosis not present

## 2019-05-03 DIAGNOSIS — Z95828 Presence of other vascular implants and grafts: Secondary | ICD-10-CM

## 2019-05-03 DIAGNOSIS — I5042 Chronic combined systolic (congestive) and diastolic (congestive) heart failure: Secondary | ICD-10-CM

## 2019-05-03 DIAGNOSIS — Z7189 Other specified counseling: Secondary | ICD-10-CM

## 2019-05-03 DIAGNOSIS — I10 Essential (primary) hypertension: Secondary | ICD-10-CM | POA: Diagnosis not present

## 2019-05-03 NOTE — Progress Notes (Signed)
Virtual Visit via Telephone Note   This visit type was conducted due to national recommendations for restrictions regarding the COVID-19 Pandemic (e.g. social distancing) in an effort to limit this patient's exposure and mitigate transmission in our community.  Due to his co-morbid illnesses, this patient is at least at moderate risk for complications without adequate follow up.  This format is felt to be most appropriate for this patient at this time.  The patient did not have access to video technology/had technical difficulties with video requiring transitioning to audio format only (telephone).  All issues noted in this document were discussed and addressed.  No physical exam could be performed with this format.  Please refer to the patient's chart for his  consent to telehealth for Rodney [name].   Evaluation Performed:  Telephone follow-up  Date:  05/03/2019   ID:  Rodney [name], DOB 09-04-1941, MRN [id number]  Patient Location:  [place] [address]  Provider location:   [address], [place], [place] [address]  PCP:  [name], MD  Cardiologist:  [name], MD Electrophysiologist:  Will Meredith Leeds, MD   Chief Complaint:  No complaints  History of Present Illness:    Rodney [name] is a 78 y.o. male who presents via audio/video conferencing for a telehealth visit today.  Rodney [name] is seen today for telephone follow-up.  Overall he is doing well without complaints.  In January he had some recurrent atrial fibrillation.  He wore a monitor which I reviewed and showed recurrent episodes of atrial fibrillation.  He is status post A. fib ablation in 2018 and was on antiarrhythmic therapy however had significant side effects.  Subsequently has been managed by rate control and saw Dr. [name] in February who made a small increase in his beta-blocker.  Overall he is remained asymptomatic with regards to his A. fib recently.  Blood pressure is well  controlled.  He has a history of aortic aneurysm with stent graft in 2010 that was assessed in 2018 by ultrasound and found to be stable.  He also has mild obstructive sleep apnea, hypertension and dyslipidemia.  His last LDL about 9 months ago was 65.  The patient does not have symptoms concerning for COVID-19 infection (fever, chills, cough, or new SHORTNESS OF BREATH).    Prior CV studies:   The following studies were reviewed today:  Chart reviewed Lab work  PMHx:  Past Medical History:  Diagnosis Date  . AAA (abdominal aortic aneurysm) ([facility])    stent graft put in 02/2009  . Arthritis   . CAD (coronary artery disease)    a. cath 2010 b. CAD s/p PCI to ramus and LCX on 03/04/15  . Gout   . Hyperlipidemia   . Hypertension   . OSA (obstructive sleep apnea) 09/30/2015   Very mild with AHI 6.2/hr  . Paroxysmal atrial fibrillation (HCC)   . Tobacco abuse     Past Surgical History:  Procedure Laterality Date  . ABDOMINAL AORTIC ANEURYSM REPAIR  02/19/2009   performed by [facility]  . ATRIAL FIBRILLATION ABLATION N/A 04/28/2017   Procedure: Atrial Fibrillation Ablation;  Surgeon: Constance Haw, MD;  Location: [facility];  Service: Cardiovascular;  Laterality: N/A;  . COLONOSCOPY    . KNEE ARTHROSCOPY  2003   left  . LEFT AND RIGHT HEART CATHETERIZATION WITH CORONARY ANGIOGRAM N/A 03/03/2015   Procedure: LEFT AND RIGHT HEART CATHETERIZATION WITH CORONARY ANGIOGRAM;  Surgeon: Joyice Faster  Claiborne Billings, MD;    . PERCUTANEOUS CORONARY STENT INTERVENTION (PCI-S) N/A 03/04/2015   Procedure: PERCUTANEOUS CORONARY STENT INTERVENTION (PCI-S);  Surgeon: Troy Sine, MD; RI 99>>0% w/   BMS, bifurcation CFX-OM 90>>40% w/ Angiosculpt PTCA; dCFX 90>>0% w/   2.7520 mm Rebel BMS  . SHOULDER ARTHROSCOPY WITH ROTATOR CUFF REPAIR AND SUBACROMIAL DECOMPRESSION Left 11/19/2013   Procedure: LEFT SHOULDER ARTHROSCOPY DEBRIDEMENT EXTENTSIVE DISTAL CLAVICULECTOMY DECOMPRESSION PARTIAL ACROMIOPLASTY WITH  CORACOACROMIAL WITH ROTATOR CUFF REPAIR ;  Surgeon: Renette Butters, MD;  Location: [facility];  Service: Orthopedics;  Laterality: Left;    FAMHx:  Family History  Problem Relation Age of Onset  . Prostate cancer Father   . Hypertension Mother   . Diabetes Sister   . Hypertension Sister   . Stroke Neg Hx   . Heart attack Neg Hx     SOCHx:   reports that he quit smoking about 3 years ago. His smoking use included cigarettes. He has a 25.00 pack-year smoking history. He has never used smokeless tobacco. He reports current alcohol use of about 7.0 - 8.0 standard drinks of alcohol per week. He reports that he does not use drugs.  ALLERGIES:  Allergies  Allergen Reactions  . Lisinopril Cough  . Iohexol Itching, Rash and Other (See Comments)     Code: RASH, Desc: PATIENT STATED THAT HE BEGAN ITCHING TOWARDS END OF INJECTION OF IV CONTRAST-- 13 HR PREP RECOMMENDED/MMS     MEDS:  Current Meds  Medication Sig  . amLODipine (NORVASC) 5 MG tablet Take 1 tablet (5 mg total) daily by mouth.  Marland Kitchen apixaban (ELIQUIS) 5 MG TABS tablet Take 1 tablet (5 mg total) by mouth 2 (two) times daily.  Marland Kitchen aspirin EC 81 MG tablet Take 81 mg by mouth daily.  Marland Kitchen atorvastatin (LIPITOR) 80 MG tablet Take 1 tablet (80 mg total) by mouth daily.  . carvedilol (COREG) 6.25 MG tablet Take 1.5 tablets (9.375 mg total) by mouth 2 (two) times daily.  Marland Kitchen diltiazem (CARDIZEM) 30 MG tablet Take 1 tablet (30 mg total) by mouth 4 (four) times daily as needed (irregular heart rate >100 and BP>100).  . famotidine (PEPCID) 40 MG tablet Take 1 tablet (40 mg total) by mouth 2 (two) times daily.  . fish oil-omega-3 fatty acids 1000 MG capsule Take 1,000 mg by mouth daily.   . furosemide (LASIX) 20 MG tablet Take 20 mg by mouth daily.  . irbesartan (AVAPRO) 300 MG tablet Take 0.5 tablets (150 mg total) by mouth daily.  . isosorbide mononitrate (IMDUR) 60 MG 24 hr tablet TAKE 1 TABLET BY MOUTH EVERY DAY ( CAN NOT BE  FILL 06.04.2018)  . KLOR-CON M10 10 MEQ tablet TAKE 1 TABLET BY MOUTH TWICE A DAY  . loratadine (CLARITIN) 10 MG tablet Take 10 mg by mouth daily as needed (seasonal allergies).   . Multiple Vitamin (MULTIVITAMIN WITH MINERALS) TABS tablet Take 1 tablet by mouth daily.  . nitroGLYCERIN (NITROSTAT) 0.4 MG SL tablet Place 1 tablet (0.4 mg total) under the tongue every 5 (five) minutes as needed for chest pain (shortness of breath).     ROS: Pertinent items noted in HPI and remainder of comprehensive ROS otherwise negative.  Labs/Other Tests and Data Reviewed:    Recent Labs: 07/22/2018: B Natriuretic Peptide 60.5 12/14/2018: ALT 26; BUN 11; Creatinine, Ser 1.29; Hemoglobin 14.0; Platelets 144; Potassium 4.2; Sodium 140   Recent Lipid Panel Lab Results  Component Value Date/Time   CHOL 115 07/23/2018  12:08 AM   TRIG 103 07/23/2018 12:08 AM   HDL 33 (L) 07/23/2018 12:08 AM   CHOLHDL 3.5 07/23/2018 12:08 AM   LDLCALC 61 07/23/2018 12:08 AM    Wt Readings from Last 3 Encounters:  05/03/19 221 lb 3.2 oz (100.3 kg)  02/04/19 224 lb (101.6 kg)  12/17/18 222 lb 3.2 oz (100.8 kg)     Exam:    Vital Signs:  BP 113/72   Pulse 61   Ht 6\' 2"  (1.88 m)   Wt 221 lb 3.2 oz (100.3 kg)   BMI 28.40 kg/m    Exam not performed due to telephone visit  ASSESSMENT & PLAN:    1. Recent chest pain with an intermediate risk Myoview stress test, LVEF 39% (07/2018) 2. CAD s/p PCI to the ramus intermedius and distal circumflex arteries (02/2015) 3. Ischemic cardiomyopathy EF 35-40%  - improved to 50-55% on 10/2017 4. History of systolic and diastolic congestive heart failure 5. Accelerated hypertension 6. History of abdominal aortic aneurysm repair 7. Paroxysmal atrial fibrillation - CHADSVASC 4, s/p ablation (2017)  Mr. Landi has some recurrent atrial fibrillation status post ablation and had had side effects from antiarrhythmic medications.  Currently he is on rate control strategy and although he  reports some recurrent A. fib he does not feel very symptomatic with it.  Blood pressure heart rate are well controlled.  He denies any chest pain.  There is no worsening shortness of breath.  He reports he is taking his Lasix now every other day rather than daily.  Weight has been stable if not improved.  No signs or symptoms of heart failure.  EF was as low as 35% in the past but it come up to 55% in 2018.  Overall seems to be doing well.  He has a follow-up with Dr. [name] in EP in 3 months and will follow-up with me annually or sooner as necessary.  COVID-19 Education: The signs and symptoms of COVID-19 were discussed with the patient and how to seek care for testing (follow up with PCP or arrange E-visit).  The importance of social distancing was discussed today.  Patient Risk:   After full review of this patients clinical status, I feel that they are at least moderate risk at this time.  Time:   Today, I have spent 25 minutes with the patient with telehealth technology discussing coronary artery disease, ischemic cardiomyopathy, paroxysmal atrial fibrillation, hypertension, dyslipidemia, aneurysm repair.     Medication Adjustments/Labs and Tests Ordered: Current medicines are reviewed at length with the patient today.  Concerns regarding medicines are outlined above.   Tests Ordered: No orders of the defined types were placed in this encounter.   Medication Changes: No orders of the defined types were placed in this encounter.   Disposition:  in 1 year(s)  [name], MD, [facility], [place] Director of the Advanced Lipid Disorders &  Cardiovascular Risk Reduction Clinic Diplomate of the American Board of Clinical Lipidology Attending Cardiologist  Direct Dial: [phone]  Fax: [phone]  Website:  [email or web address]  [name], MD  05/03/2019 8:18 AM

## 2019-05-03 NOTE — Patient Instructions (Signed)
Medication Instructions:  Your physician recommends that you continue on your current medications as directed. Please refer to the Current Medication list given to you today.  If you need a refill on your cardiac medications before your next appointment, please call your pharmacy.   Lab work: NONE needed If you have labs (blood work) drawn today and your tests are completely normal, you will receive your results only by: Marland Kitchen MyChart Message (if you have MyChart) OR . A paper copy in the mail If you have any lab test that is abnormal or we need to change your treatment, we will call you to review the results.  Testing/Procedures: NONE  Follow-Up: At [facility], you and your health needs are our priority.  As part of our continuing mission to provide you with exceptional heart care, we have created designated Provider Care Teams.  These Care Teams include your primary Cardiologist (physician) and Advanced Practice Providers (APPs -  Physician Assistants and Nurse Practitioners) who all work together to provide you with the care you need, when you need it. You will need a follow up appointment in 12 months.  Please call our office 2 months in advance to schedule this appointment.  You may see [name], MD or one of the following Advanced Practice Providers on your designated Care Team: [place], [place] . Fabian Sharp, PA-C  Any Other Special Instructions Will Be Listed Below (If Applicable).

## 2019-07-11 ENCOUNTER — Other Ambulatory Visit: Payer: Self-pay | Admitting: Physician Assistant

## 2019-07-15 ENCOUNTER — Telehealth: Payer: Self-pay

## 2019-07-15 NOTE — Progress Notes (Signed)
Cardiology Office Note Date:  07/16/2019  Patient ID:  Rodney [name], Rodney [name] June 01, 1941, MRN [id number] PCP:  [name], MD  Cardiologist:  Dr. Debara Pickett Electrophysiologist: Dr. [name]    Chief Complaint: over due visit  History of Present Illness: Rodney BORNER is a 78 y.o. male with history of CAD (PCI to RI & CFX 2016), HTN, HLD, AAA s/p stent grafting 2012 (Dr. Kellie Simmering), Paroxysmal AFib, ICM (EF 28% on 02/2015 by myoview but preservde on his 02/2018 echo), chronic CHF (diastolic), mild OSA not requiring therapy.  Jan 2020 he saw [name], PA with increasing symptomatic palpitations despite eliminating ETOH, nearly quitting smoking, reduction in caffeine as well.  His Coreg was cautiously increased with instructions to monitor closely for symptomatic bradycardia, planned for 30 day monitor.  His Lasix and ARB were reduced to avoid hypotension, not felt to be actibvely fluid OL He comes in today to be seen for Dr. [name].  Last seen by him in f/u from Rhonda's visit and holter findings with narrow complex tachycardia and his palpitations. Dr. [name] also felt he had some Afib on his monitor.  The patient reported with increase in his coreg though his palpitations were much improved, no changes were made with plans for 6 mo f/u.  He comes in today states he is doing OK but is having AFib about 2x week, lasting 5-6 hours in duration.  No CP, no palpitations, but feels fatigued/tired when in AFib, his BP never changes and is always very good, her observes his HR 58-60 routienly even at times he suspects/thinks he is in AFib.  No dizziness, near syncope or syncope. He denies any bleeding or signs of bleeding with his Eliquis and reports compliance.  He continues to work on stopping smoking.  COVID education/precautions were discussed with the patient today   AFib Hx PVI ablation, Dr. [name], 04/28/17 AAD hx Tikosyn >> stopped 03/2017, recurrent AFib Amiodarone >> stopped Aug 2018,  maintaining SR post ablation, and c/o fatigue, bradycardia   Past Medical History:  Diagnosis Date  . AAA (abdominal aortic aneurysm) ([facility])    stent graft put in 02/2009  . Arthritis   . CAD (coronary artery disease)    a. cath 2010 b. CAD s/p PCI to ramus and LCX on 03/04/15  . Gout   . Hyperlipidemia   . Hypertension   . OSA (obstructive sleep apnea) 09/30/2015   Very mild with AHI 6.2/hr  . Paroxysmal atrial fibrillation (HCC)   . Tobacco abuse     Past Surgical History:  Procedure Laterality Date  . ABDOMINAL AORTIC ANEURYSM REPAIR  02/19/2009   performed by [facility]  . ATRIAL FIBRILLATION ABLATION N/A 04/28/2017   Procedure: Atrial Fibrillation Ablation;  Surgeon: Constance Haw, MD;  Location: [facility];  Service: Cardiovascular;  Laterality: N/A;  . COLONOSCOPY    . KNEE ARTHROSCOPY  2003   left  . LEFT AND RIGHT HEART CATHETERIZATION WITH CORONARY ANGIOGRAM N/A 03/03/2015   Procedure: LEFT AND RIGHT HEART CATHETERIZATION WITH CORONARY ANGIOGRAM;  Surgeon: Troy Sine, MD;    . PERCUTANEOUS CORONARY STENT INTERVENTION (PCI-S) N/A 03/04/2015   Procedure: PERCUTANEOUS CORONARY STENT INTERVENTION (PCI-S);  Surgeon: Troy Sine, MD; RI 99>>0% w/   BMS, bifurcation CFX-OM 90>>40% w/ Angiosculpt PTCA; dCFX 90>>0% w/   2.7520 mm Rebel BMS  . SHOULDER ARTHROSCOPY WITH ROTATOR CUFF REPAIR AND SUBACROMIAL DECOMPRESSION Left 11/19/2013   Procedure: LEFT SHOULDER ARTHROSCOPY DEBRIDEMENT EXTENTSIVE DISTAL CLAVICULECTOMY DECOMPRESSION PARTIAL  ACROMIOPLASTY WITH CORACOACROMIAL WITH ROTATOR CUFF REPAIR ;  Surgeon: Renette Butters, MD;  Location: [facility];  Service: Orthopedics;  Laterality: Left;    Current Outpatient Medications  Medication Sig Dispense Refill  . amLODipine (NORVASC) 5 MG tablet Take 1 tablet (5 mg total) daily by mouth. 90 tablet 3  . apixaban (ELIQUIS) 5 MG TABS tablet Take 1 tablet (5 mg total) by mouth 2 (two) times daily. 180 tablet 3   . aspirin EC 81 MG tablet Take 81 mg by mouth daily.    Marland Kitchen atorvastatin (LIPITOR) 80 MG tablet Take 1 tablet (80 mg total) by mouth daily. 90 tablet 3  . carvedilol (COREG) 6.25 MG tablet TAKE 1 TABLET BY MOUTH TWICE A DAY 180 tablet 1  . diltiazem (CARDIZEM) 30 MG tablet Take 1 tablet (30 mg total) by mouth 4 (four) times daily as needed (irregular heart rate >100 and BP>100). 45 tablet 2  . famotidine (PEPCID) 40 MG tablet Take 1 tablet (40 mg total) by mouth 2 (two) times daily. 180 tablet 2  . fish oil-omega-3 fatty acids 1000 MG capsule Take 1,000 mg by mouth daily.     . furosemide (LASIX) 20 MG tablet Take 20 mg by mouth daily.    . irbesartan (AVAPRO) 300 MG tablet Take 0.5 tablets (150 mg total) by mouth daily. 45 tablet 2  . isosorbide mononitrate (IMDUR) 60 MG 24 hr tablet TAKE 1 TABLET BY MOUTH EVERY DAY ( CAN NOT BE FILL 06.04.2018) 90 tablet 2  . KLOR-CON M10 10 MEQ tablet TAKE 1 TABLET BY MOUTH TWICE A DAY 180 tablet 3  . loratadine (CLARITIN) 10 MG tablet Take 10 mg by mouth daily as needed (seasonal allergies).     . Multiple Vitamin (MULTIVITAMIN WITH MINERALS) TABS tablet Take 1 tablet by mouth daily.    . nitroGLYCERIN (NITROSTAT) 0.4 MG SL tablet Place 1 tablet (0.4 mg total) under the tongue every 5 (five) minutes as needed for chest pain (shortness of breath). 25 tablet 3   No current facility-administered medications for this visit.     Allergies:   Lisinopril and Iohexol   Social History:  The patient  reports that he quit smoking about 3 years ago. His smoking use included cigarettes. He has a 25.00 pack-year smoking history. He has never used smokeless tobacco. He reports current alcohol use of about 7.0 - 8.0 standard drinks of alcohol per week. He reports that he does not use drugs.   Family History:  The patient's family history includes Diabetes in his sister; Hypertension in his mother and sister; Prostate cancer in his father.  ROS:  Please see the history of  present illness.  All other systems are reviewed and otherwise negative.   PHYSICAL EXAM:  VS:  BP 123/79   Pulse 62   Ht 6\' 2"  (1.88 m)   Wt 222 lb (100.7 kg)   BMI 28.50 kg/m  BMI: Body mass index is 28.5 kg/m. Well nourished, well developed, in no acute distress  HEENT: normocephalic, atraumatic  Neck: no JVD, carotid bruits or masses Cardiac:  RRR; extrasystoles appreciated, no significant murmurs, no rubs, or gallops Lungs:  CTA b/l, no wheezing, rhonchi or rales  Abd: soft, nontender MS: no deformity or atrophy Ext:  no edema  Skin: warm and dry, no rash Neuro:  No gross deficits appreciated Psych: euthymic mood, full affect     EKG:  Done today and reviewed by myself is SR, PACs 30  day EM, Jan 2020 Paroxysmal narrow complex tachycardia, suspect PAF (although labeled NSVT).   08/07/18: TTE Study Conclusions - Left ventricle: Systolic function was normal. The estimated   ejection fraction was in the range of 55% to 60%. Wall motion was   normal; there were no regional wall motion abnormalities. Doppler   parameters are consistent with abnormal left ventricular   relaxation (grade 1 diastolic dysfunction).  Impressions: - Left ventricular systolic function has further improved compared   to the previous studies.   07/27/18: Lexiscan stress test  EKG is nondiagnostic due to baseline changes  Decreased tracer activity in the inferior and basal inferoseptal wall consistent with possible scar and/or soft tissue attenuation No ischemia.  LVEF calculated at 39% Recommend echo to further define LVEF and wall motion if not already done  Intermediate risk study  04/28/17: EPS/Ablation CONCLUSIONS: 1. Sinus rhythm upon presentation.   2. Successful electrical isolation and anatomical encircling of all four pulmonary veins with radiofrequency current. 3. No inducible arrhythmias following ablation both on and off of dobutamine 4. No early apparent complications.   Recent Labs: 07/22/2018: B Natriuretic Peptide 60.5 12/14/2018: ALT 26; BUN 11; Creatinine, Ser 1.29; Hemoglobin 14.0; Platelets 144; Potassium 4.2; Sodium 140  07/23/2018: Cholesterol 115; HDL 33; LDL Cholesterol 61; Total CHOL/HDL Ratio 3.5; Triglycerides 103; VLDL 21   CrCl cannot be calculated (Patient's most recent lab result is older than the maximum 21 days allowed.).   Wt Readings from Last 3 Encounters:  07/16/19 222 lb (100.7 kg)  05/03/19 221 lb 3.2 oz (100.3 kg)  02/04/19 224 lb (101.6 kg)     Other studies reviewed: Additional studies/records reviewed today include: summarized above  ASSESSMENT AND PLAN:  1. Paroxysmal AFib     CHA2DS2Vasc is at least 4, on Eliquis, appropriately dosed by last labs  The patient reports fatigue, for a few hours a couple days a week that he attributes or connects to being in Afib. He denies dizziness, near syncope or syncope.  Continues to golf and all in all does not feel like his ability to do his ADLs is limited.  He just has days that he feels unusually fatigued and tired for a few hours He has made the observation that at these times his BP and HR are at his usual 120's/70 and 58-60bpm or so.  He mentions that his BP machine tells him he is afib (by an indicator light) and that is how he confirms/connects the symptom to AF.  In review of Dr. Macky Lower note and holter read, he has narrow complex tachycardia that was his AFib.  I wonder if his fatigue is perhaps not the AFib since he has not noted any fast HRs.  He has PACs today that I heard on exam and confirmed with EKG, perhaps this is fooling his machine.  I approached the idea of perhaps a loop implant to  Better evaluate his Af burden and medical management, ? Bradycardia.  He was quite reluctant to consider a loop or even wearing another monitor at this juncture. For now he is asked to keep a symptom, BP, HR daily log and I wll see him back in a month or so with his machine to check as  well.    2. HTN     Looks good  3. CAD     No anginal symptoms     C/w Dr. Debara Pickett  4. Chronic CHF (diastolic)      No symptoms or exam findings  to suggest fluid OL     Weight is stable     Disposition: F/u as noted above  Current medicines are reviewed at length with the patient today.  The patient did not have any concerns regarding medicines.  Venetia Night, PA-C 07/16/2019 4:09 PM     [address] [place] [address] [phone] (office)  [phone] (fax)

## 2019-07-15 NOTE — Telephone Encounter (Signed)

## 2019-07-16 ENCOUNTER — Other Ambulatory Visit: Payer: Self-pay

## 2019-07-16 ENCOUNTER — Ambulatory Visit: Payer: Medicare Other | Admitting: Physician Assistant

## 2019-07-16 ENCOUNTER — Encounter: Payer: Self-pay | Admitting: Physician Assistant

## 2019-07-16 VITALS — BP 123/79 | HR 62 | Ht 74.0 in | Wt 222.0 lb

## 2019-07-16 DIAGNOSIS — I480 Paroxysmal atrial fibrillation: Secondary | ICD-10-CM

## 2019-07-16 NOTE — Patient Instructions (Signed)
Medication Instructions:  Your physician recommends that you continue on your current medications as directed. Please refer to the Current Medication list given to you today.  If you need a refill on your cardiac medications before your next appointment, please call your pharmacy.   Lab work: NONE ORDERED  TODAY   If you have labs (blood work) drawn today and your tests are completely normal, you will receive your results only by: Marland Kitchen MyChart Message (if you have MyChart) OR . A paper copy in the mail If you have any lab test that is abnormal or we need to change your treatment, we will call you to review the results.  Testing/Procedures: NONE ORDERED  TODAY  Follow-Up: At [facility], you and your health needs are our priority.  As part of our continuing mission to provide you with exceptional heart care, we have created designated Provider Care Teams.  These Care Teams include your primary Cardiologist (physician) and Advanced Practice Providers (APPs -  Physician Assistants and Nurse Practitioners) who all work together to provide you with the care you need, when you need it. You will need a follow up appointment in 4 weeks.  You may see Will Meredith Leeds, MD or one of the following Advanced Practice Providers on your designated Care Team:   Chanetta Marshall, NP . Tommye Standard, PA-C  Any Other Special Instructions Will Be Listed Below (If Applicable).

## 2019-08-11 NOTE — Progress Notes (Signed)
Cardiology Office Note Date:  08/11/2019  Patient ID:  Rodney [name], Rodney [name] Oct 10, 1941, MRN [id number] PCP:  [name], MD  Cardiologist:  Dr. Debara Pickett Electrophysiologist: Dr. [name]    Chief Complaint:   Planned follow up   History of Present Illness: Rodney [name] is a 78 y.o. male with history of CAD (PCI to [place] CFX 2016), HTN, HLD, AAA s/p stent grafting 2012 (Dr. Kellie Simmering), Paroxysmal AFib, ICM (EF 28% on 02/2015 by myoview but preserved on his 02/2018 echo), chronic CHF (diastolic), mild OSA not requiring therapy.  Jan 2020 he saw [name], PA with increasing symptomatic palpitations despite eliminating ETOH, nearly quitting smoking, reduction in caffeine as well.  His Coreg was cautiously increased with instructions to monitor closely for symptomatic bradycardia, planned for 30 day monitor.  His Lasix and ARB were reduced to avoid hypotension, not felt to be actibvely fluid OL He comes in today to be seen for Dr. [name].  Last seen by him in f/u from Rhonda's visit and holter findings with narrow complex tachycardia and his palpitations. Dr. [name] also felt he had some Afib on his monitor.  The patient reported with increase in his coreg though his palpitations were much improved, no changes were made with plans for 6 mo f/u.  I saw him 07/16/2019, he stated he is doing OK but is having AFib about 2x week, lasting 5-6 hours in duration.  No associated CP, no palpitations, but reported being very fatigued/tired when in AFib, reported his BP never changes and is always very good, he observed his HR 58-60 routienly INCLUDING times he suspected/thoughts he was in AFib.  No dizziness, near syncope or syncope. He denied any bleeding or signs of bleeding with his Eliquis and reports compliance. He was working on stopping smoking.  We discussed loop implant to get a better idea of what his symptoms correlated to, ? I felt it unusual that his BP and HR remained unchanged with times he felt  he was in afib and not.  Particularly since his monitor noted his Af rates as fast.   He did not want to pursue loop or event revisiting an EM.  Decided to see him back in a few weeks keeping a symptom/vitals diary   He has felt well.  Only once sine he saw me has his BP machine indicated his HR irregular, he had no associated symptoms and again his BP and HR remained at his baseline.  Usually 130's-140/70's and HR 60's.  He feels like he has no real cardiac awareness, no CP or palpitations, no exertional intolerances, still golfing regularly and in fact says since his last visit his gold game has improved ! No dizzy spells, no near syncope or syncope. Aggain, no bleeding or signs of bleeding, he is tolerating his medicines.  Mentions some ED perhaps 2/2 his pills.    AFib Hx PVI ablation, Dr. [name], 04/28/17 AAD hx Tikosyn >> stopped 03/2017, recurrent AFib Amiodarone >> stopped Aug 2018, maintaining SR post ablation, and c/o fatigue, bradycardia   Past Medical History:  Diagnosis Date  . AAA (abdominal aortic aneurysm) ([place])    stent graft put in 02/2009  . Arthritis   . CAD (coronary artery disease)    a. cath 2010 b. CAD s/p PCI to ramus and LCX on 03/04/15  . Gout   . Hyperlipidemia   . Hypertension   . OSA (obstructive sleep apnea) 09/30/2015   Very mild with AHI 6.2/hr  .  Paroxysmal atrial fibrillation (HCC)   . Tobacco abuse     Past Surgical History:  Procedure Laterality Date  . ABDOMINAL AORTIC ANEURYSM REPAIR  02/19/2009   performed by [facility]  . ATRIAL FIBRILLATION ABLATION N/A 04/28/2017   Procedure: Atrial Fibrillation Ablation;  Surgeon: Constance Haw, MD;  Location: [facility];  Service: Cardiovascular;  Laterality: N/A;  . COLONOSCOPY    . KNEE ARTHROSCOPY  2003   left  . LEFT AND RIGHT HEART CATHETERIZATION WITH CORONARY ANGIOGRAM N/A 03/03/2015   Procedure: LEFT AND RIGHT HEART CATHETERIZATION WITH CORONARY ANGIOGRAM;  Surgeon: Troy Sine, MD;     . PERCUTANEOUS CORONARY STENT INTERVENTION (PCI-S) N/A 03/04/2015   Procedure: PERCUTANEOUS CORONARY STENT INTERVENTION (PCI-S);  Surgeon: Troy Sine, MD; RI 99>>0% w/   BMS, bifurcation CFX-OM 90>>40% w/ Angiosculpt PTCA; dCFX 90>>0% w/   2.7520 mm Rebel BMS  . SHOULDER ARTHROSCOPY WITH ROTATOR CUFF REPAIR AND SUBACROMIAL DECOMPRESSION Left 11/19/2013   Procedure: LEFT SHOULDER ARTHROSCOPY DEBRIDEMENT EXTENTSIVE DISTAL CLAVICULECTOMY DECOMPRESSION PARTIAL ACROMIOPLASTY WITH CORACOACROMIAL WITH ROTATOR CUFF REPAIR ;  Surgeon: Renette Butters, MD;  Location: [facility];  Service: Orthopedics;  Laterality: Left;    Current Outpatient Medications  Medication Sig Dispense Refill  . amLODipine (NORVASC) 5 MG tablet Take 1 tablet (5 mg total) daily by mouth. 90 tablet 3  . apixaban (ELIQUIS) 5 MG TABS tablet Take 1 tablet (5 mg total) by mouth 2 (two) times daily. 180 tablet 3  . aspirin EC 81 MG tablet Take 81 mg by mouth daily.    Marland Kitchen atorvastatin (LIPITOR) 80 MG tablet Take 1 tablet (80 mg total) by mouth daily. 90 tablet 3  . carvedilol (COREG) 6.25 MG tablet TAKE 1 TABLET BY MOUTH TWICE A DAY 180 tablet 1  . diltiazem (CARDIZEM) 30 MG tablet Take 1 tablet (30 mg total) by mouth 4 (four) times daily as needed (irregular heart rate >100 and BP>100). 45 tablet 2  . famotidine (PEPCID) 40 MG tablet Take 1 tablet (40 mg total) by mouth 2 (two) times daily. 180 tablet 2  . fish oil-omega-3 fatty acids 1000 MG capsule Take 1,000 mg by mouth daily.     . furosemide (LASIX) 20 MG tablet Take 20 mg by mouth daily.    . irbesartan (AVAPRO) 300 MG tablet Take 0.5 tablets (150 mg total) by mouth daily. 45 tablet 2  . isosorbide mononitrate (IMDUR) 60 MG 24 hr tablet TAKE 1 TABLET BY MOUTH EVERY DAY ( CAN NOT BE FILL 06.04.2018) 90 tablet 2  . KLOR-CON M10 10 MEQ tablet TAKE 1 TABLET BY MOUTH TWICE A DAY 180 tablet 3  . loratadine (CLARITIN) 10 MG tablet Take 10 mg by mouth daily as needed  (seasonal allergies).     . Multiple Vitamin (MULTIVITAMIN WITH MINERALS) TABS tablet Take 1 tablet by mouth daily.    . nitroGLYCERIN (NITROSTAT) 0.4 MG SL tablet Place 1 tablet (0.4 mg total) under the tongue every 5 (five) minutes as needed for chest pain (shortness of breath). 25 tablet 3   No current facility-administered medications for this visit.     Allergies:   Lisinopril and Iohexol   Social History:  The patient  reports that he quit smoking about 3 years ago. His smoking use included cigarettes. He has a 25.00 pack-year smoking history. He has never used smokeless tobacco. He reports current alcohol use of about 7.0 - 8.0 standard drinks of alcohol per week. He reports  that he does not use drugs.   Family History:  The patient's family history includes Diabetes in his sister; Hypertension in his mother and sister; Prostate cancer in his father.  ROS:  Please see the history of present illness.  All other systems are reviewed and otherwise negative.   PHYSICAL EXAM:  VS:  There were no vitals taken for this visit. BMI: There is no height or weight on file to calculate BMI. Well nourished, well developed, in no acute distress  HEENT: normocephalic, atraumatic  Neck: no JVD, carotid bruits or masses Cardiac:  RRR; extrasystoles appreciated, no significant murmurs, no rubs, or gallops Lungs:   CTA b/l, no wheezing, rhonchi or rales  Abd: soft, nontender MS: no deformity or  atrophy Ext: no edema  Skin: warm and dry, no rash Neuro:  No gross deficits appreciated Psych: euthymic mood, full affect     EKG:  Not done today    30 day EM, Jan 2020 Paroxysmal narrow complex tachycardia, suspect PAF (although labeled NSVT).   08/07/18: TTE Study Conclusions - Left ventricle: Systolic function was normal. The estimated   ejection fraction was in the range of 55% to 60%. Wall motion was   normal; there were no regional wall motion abnormalities. Doppler   parameters are  consistent with abnormal left ventricular   relaxation (grade 1 diastolic dysfunction).  Impressions: - Left ventricular systolic function has further improved compared   to the previous studies.   07/27/18: Lexiscan stress test  EKG is nondiagnostic due to baseline changes  Decreased tracer activity in the inferior and basal inferoseptal wall consistent with possible scar and/or soft tissue attenuation No ischemia.  LVEF calculated at 39% Recommend echo to further define LVEF and wall motion if not already done  Intermediate risk study  04/28/17: EPS/Ablation CONCLUSIONS: 1. Sinus rhythm upon presentation.   2. Successful electrical isolation and anatomical encircling of all four pulmonary veins with radiofrequency current. 3. No inducible arrhythmias following ablation both on and off of dobutamine 4. No early apparent complications.  Recent Labs: 12/14/2018: ALT 26; BUN 11; Creatinine, Ser 1.29; Hemoglobin 14.0; Platelets 144; Potassium 4.2; Sodium 140  No results found for requested labs within last 8760 hours.   CrCl cannot be calculated (Patient's most recent lab result is older than the maximum 21 days allowed.).   Wt Readings from Last 3 Encounters:  07/16/19 222 lb (100.7 kg)  05/03/19 221 lb 3.2 oz (100.3 kg)  02/04/19 224 lb (101.6 kg)     Other studies reviewed: Additional studies/records reviewed today include: summarized above  ASSESSMENT AND PLAN:  1. Paroxysmal AFib     CHA2DS2Vasc is at least 4, on Eliquis, appropriately dosed by last labs     [facility]  No symptoms.  He gauges his AFib burden base on his BP machine.  We discussed if he wanted to invest in the [place] device may be an option for him, to better identify his AFib burden/correlate his BP cuff report.  This felt more palatable to him then a loop or wearable monitor device, he would look into it.   2. HTN     Looks good, no changes     He brought his  home cuff today, SBP read 24mmHg higher then my manual reading, otherwise HR and DBP were right on.  I think his machine is reasonable to use, keeping in mind his SBP likely slightly lower.  3. CAD  No anginal symptoms     On ASA, statin, BB     C/w Dr. Debara Pickett  4. Chronic CHF (diastolic)     No symptoms or exam findings to suggest fluid OL     Weight is stable     Disposition: will see him back for EP in 68mo, sooner if needed    Current medicines are reviewed at length with the patient today.  The patient did not have any concerns regarding medicines.  Venetia Night, PA-C 08/11/2019 6:34 PM     [address] [place] [address] [phone] (office)  [phone] (fax)

## 2019-08-13 ENCOUNTER — Encounter: Payer: Self-pay | Admitting: Physician Assistant

## 2019-08-13 ENCOUNTER — Other Ambulatory Visit: Payer: Self-pay

## 2019-08-13 ENCOUNTER — Ambulatory Visit (INDEPENDENT_AMBULATORY_CARE_PROVIDER_SITE_OTHER): Payer: Medicare Other | Admitting: Physician Assistant

## 2019-08-13 VITALS — BP 122/74 | HR 60 | Ht 74.0 in | Wt 223.0 lb

## 2019-08-13 DIAGNOSIS — I5032 Chronic diastolic (congestive) heart failure: Secondary | ICD-10-CM

## 2019-08-13 DIAGNOSIS — I2510 Atherosclerotic heart disease of native coronary artery without angina pectoris: Secondary | ICD-10-CM | POA: Diagnosis not present

## 2019-08-13 DIAGNOSIS — I10 Essential (primary) hypertension: Secondary | ICD-10-CM | POA: Diagnosis not present

## 2019-08-13 DIAGNOSIS — I480 Paroxysmal atrial fibrillation: Secondary | ICD-10-CM

## 2019-08-13 MED ORDER — CARVEDILOL 6.25 MG PO TABS: 9.3750 mg | ORAL_TABLET | Freq: Two times a day (BID) | ORAL | 3 refills | Status: DC

## 2019-08-13 NOTE — Patient Instructions (Addendum)
Medication Instructions:  Your physician recommends that you continue on your current medications as directed. Please refer to the Current Medication list given to you today.  If you need a refill on your cardiac medications before your next appointment, please call your pharmacy.   Lab work: NONE ORDERED  TODAY'  If you have labs (blood work) drawn today and your tests are completely normal, you will receive your results only by: Marland Kitchen MyChart Message (if you have MyChart) OR . A paper copy in the mail If you have any lab test that is abnormal or we need to change your treatment, we will call you to review the results.  Testing/Procedures: NONE ORDERED  TODAY   Follow-Up: At [facility], you and your health needs are our priority.  As part of our continuing mission to provide you with exceptional heart care, we have created designated Provider Care Teams.  These Care Teams include your primary Cardiologist (physician) and Advanced Practice Providers (APPs -  Physician Assistants and Nurse Practitioners) who all work together to provide you with the care you need, when you need it. You will need a follow up appointment in 6 months.  Please call our office 2 months in advance to schedule this appointment.  You may see Will Meredith Leeds, MD or one of the following Advanced Practice Providers on your designated Care Team:   Chanetta Marshall, NP . Tommye Standard, PA-C . Joesph July PA-C   Any Other Special Instructions Will Be Listed Below (If Applicable).

## 2020-01-03 ENCOUNTER — Ambulatory Visit: Payer: Medicare Other | Attending: Internal Medicine

## 2020-01-03 DIAGNOSIS — Z23 Encounter for immunization: Secondary | ICD-10-CM

## 2020-01-06 NOTE — Progress Notes (Signed)
   Covid-19 Vaccination Clinic  Name:  Rodney [name]    MRN: [id number] DOB: [birth year]  01/03/2020  [name] was observed post Covid-19 immunization for 15 minutes without incidence. He was provided with Vaccine Information Sheet and instruction to access the V-Safe system.   [name] was instructed to call 911 with any severe reactions post vaccine: Marland Kitchen Difficulty breathing  . Swelling of your face and throat  . A fast heartbeat  . A bad rash all over your body  . Dizziness and weakness    Immunizations Administered    Name Date Dose VIS Date Route   Moderna COVID-19 Vaccine 01/03/2020 10:13 AM 0.5 mL 11/12/2019 Intramuscular   Manufacturer: Moderna   Lot: [id number]   [id number]

## 2020-01-31 ENCOUNTER — Ambulatory Visit: Payer: Medicare Other

## 2020-02-01 ENCOUNTER — Other Ambulatory Visit: Payer: Self-pay | Admitting: Internal Medicine

## 2020-02-07 ENCOUNTER — Ambulatory Visit: Payer: Medicare Other | Attending: Internal Medicine

## 2020-02-07 DIAGNOSIS — Z23 Encounter for immunization: Secondary | ICD-10-CM | POA: Insufficient documentation

## 2020-02-07 NOTE — Progress Notes (Signed)
   Covid-19 Vaccination Clinic  Name:  Rodney [name]    MRN: [id number] DOB: 13-Jun-1941  02/07/2020  Mr. Merlos was observed post Covid-19 immunization for 15 minutes without incidence. He was provided with Vaccine Information Sheet and instruction to access the V-Safe system.   Mr. Hales was instructed to call 911 with any severe reactions post vaccine: Marland Kitchen Difficulty breathing  . Swelling of your face and throat  . A fast heartbeat  . A bad rash all over your body  . Dizziness and weakness    Immunizations Administered    Name Date Dose VIS Date Route   Moderna COVID-19 Vaccine 02/07/2020  8:52 AM 0.5 mL 11/12/2019 Intramuscular   Manufacturer: Moderna   Lot: [id number]   [place]:[id number]

## 2020-02-21 NOTE — Progress Notes (Signed)
PCP:  [name], MD Primary Cardiologist: [name], MD Electrophysiologist: Dr. Dwana Melena is a 79 y.o. male with past medical history of CAD, HTN, HLD, AAA s/p stent grafting, PAF, ICM, chronic diastolic CHF, and mild OSA not requiring CPAP who presents today for routine electrophysiology followup. They are seen for Dr. [name].   Since last being seen in our clinic, the patient reports doing very well.  BP at home runs 120-130s  The patient feels that he is tolerating medications without difficulties and is otherwise without complaint today.   Past Medical History:  Diagnosis Date  . AAA (abdominal aortic aneurysm) ([place])    stent graft put in 02/2009  . Arthritis   . CAD (coronary artery disease)    a. cath 2010 b. CAD s/p PCI to ramus and LCX on 03/04/15  . Gout   . Hyperlipidemia   . Hypertension   . OSA (obstructive sleep apnea) 09/30/2015   Very mild with AHI 6.2/hr  . Paroxysmal atrial fibrillation (HCC)   . Tobacco abuse    Past Surgical History:  Procedure Laterality Date  . ABDOMINAL AORTIC ANEURYSM REPAIR  02/19/2009   performed by [facility]  . ATRIAL FIBRILLATION ABLATION N/A 04/28/2017   Procedure: Atrial Fibrillation Ablation;  Surgeon: Constance Haw, MD;  Location: [facility];  Service: Cardiovascular;  Laterality: N/A;  . COLONOSCOPY    . KNEE ARTHROSCOPY  2003   left  . LEFT AND RIGHT HEART CATHETERIZATION WITH CORONARY ANGIOGRAM N/A 03/03/2015   Procedure: LEFT AND RIGHT HEART CATHETERIZATION WITH CORONARY ANGIOGRAM;  Surgeon: Troy Sine, MD;    . PERCUTANEOUS CORONARY STENT INTERVENTION (PCI-S) N/A 03/04/2015   Procedure: PERCUTANEOUS CORONARY STENT INTERVENTION (PCI-S);  Surgeon: Troy Sine, MD; RI 99>>0% w/   BMS, bifurcation CFX-OM 90>>40% w/ Angiosculpt PTCA; dCFX 90>>0% w/   2.7520 mm Rebel BMS  . SHOULDER ARTHROSCOPY WITH ROTATOR CUFF REPAIR AND SUBACROMIAL DECOMPRESSION Left 11/19/2013   Procedure: LEFT SHOULDER  ARTHROSCOPY DEBRIDEMENT EXTENTSIVE DISTAL CLAVICULECTOMY DECOMPRESSION PARTIAL ACROMIOPLASTY WITH CORACOACROMIAL WITH ROTATOR CUFF REPAIR ;  Surgeon: Renette Butters, MD;  Location: [facility];  Service: Orthopedics;  Laterality: Left;    Current Outpatient Medications  Medication Sig Dispense Refill  . amLODipine (NORVASC) 5 MG tablet Take 1 tablet (5 mg total) daily by mouth. 90 tablet 3  . apixaban (ELIQUIS) 5 MG TABS tablet Take 1 tablet (5 mg total) by mouth 2 (two) times daily. 180 tablet 3  . aspirin EC 81 MG tablet Take 81 mg by mouth daily.    Marland Kitchen atorvastatin (LIPITOR) 80 MG tablet Take 1 tablet (80 mg total) by mouth daily. 90 tablet 3  . carvedilol (COREG) 6.25 MG tablet Take 1.5 tablets (9.375 mg total) by mouth 2 (two) times daily with a meal. 90 tablet 3  . diltiazem (CARDIZEM) 30 MG tablet Take 1 tablet (30 mg total) by mouth 4 (four) times daily as needed (irregular heart rate >100 and BP>100). 45 tablet 2  . famotidine (PEPCID) 40 MG tablet Take 1 tablet (40 mg total) by mouth 2 (two) times daily. 180 tablet 2  . fish oil-omega-3 fatty acids 1000 MG capsule Take 1,000 mg by mouth daily.     . furosemide (LASIX) 20 MG tablet Take 20 mg by mouth daily.    . irbesartan (AVAPRO) 300 MG tablet TAKE 1/2 TABLET(150 MG) BY MOUTH DAILY 45 tablet 0  . isosorbide mononitrate (IMDUR) 60  MG 24 hr tablet TAKE 1 TABLET BY MOUTH EVERY DAY ( CAN NOT BE FILL 06.04.2018) 90 tablet 2  . KLOR-CON M10 10 MEQ tablet TAKE 1 TABLET BY MOUTH TWICE A DAY 180 tablet 3  . loratadine (CLARITIN) 10 MG tablet Take 10 mg by mouth daily as needed (seasonal allergies).     . Multiple Vitamin (MULTIVITAMIN WITH MINERALS) TABS tablet Take 1 tablet by mouth daily.    . nitroGLYCERIN (NITROSTAT) 0.4 MG SL tablet Place 1 tablet (0.4 mg total) under the tongue every 5 (five) minutes as needed for chest pain (shortness of breath). 25 tablet 3   No current facility-administered medications for this visit.     Allergies  Allergen Reactions  . Lisinopril Cough  . Iohexol Itching, Rash and Other (See Comments)     Code: RASH, Desc: PATIENT STATED THAT HE BEGAN ITCHING TOWARDS END OF INJECTION OF IV CONTRAST-- 13 HR PREP RECOMMENDED/MMS     Social History   Socioeconomic History  . Marital status: Married    Spouse name: Not on file  . Number of children: Not on file  . Years of education: Not on file  . Highest education level: Not on file  Occupational History  . Not on file  Tobacco Use  . Smoking status: Former Smoker    Packs/day: 0.50    Years: 50.00    Pack years: 25.00    Types: Cigarettes    Quit date: 03/01/2016    Years since quitting: 3.9  . Smokeless tobacco: Never Used  Substance and Sexual Activity  . Alcohol use: Yes    Alcohol/week: 7.0 - 8.0 standard drinks    Types: 6 Cans of beer, 1 - 2 Shots of liquor per week    Comment: couple shots of royal per day  . Drug use: No  . Sexual activity: Not Currently    Comment: cutting down 1/2 ppd  Other Topics Concern  . Not on file  Social History Narrative   Lives with wife, does not use assist, drives.  No home health services.     Social Determinants of Health   Financial Resource Strain:   . Difficulty of Paying Living Expenses:   Food Insecurity:   . Worried About Charity fundraiser in the Last Year:   . Arboriculturist in the Last Year:   Transportation Needs:   . Film/video editor (Medical):   Marland Kitchen Lack of Transportation (Non-Medical):   Physical Activity:   . Days of Exercise per Week:   . Minutes of Exercise per Session:   Stress:   . Feeling of Stress :   Social Connections:   . Frequency of Communication with Friends and Family:   . Frequency of Social Gatherings with Friends and Family:   . Attends Religious Services:   . Active Member of Clubs or Organizations:   . Attends Archivist Meetings:   Marland Kitchen Marital Status:   Intimate Partner Violence:   . Fear of Current or Ex-Partner:    . Emotionally Abused:   Marland Kitchen Physically Abused:   . Sexually Abused:      Review of Systems: General: No chills, fever, night sweats or weight changes  Cardiovascular:  No chest pain, dyspnea on exertion, edema, orthopnea, palpitations, paroxysmal nocturnal dyspnea Dermatological: No rash, lesions or masses Respiratory: No cough, dyspnea Urologic: No hematuria, dysuria Abdominal: No nausea, vomiting, diarrhea, bright red blood per rectum, melena, or hematemesis Neurologic: No visual changes, weakness,  changes in mental status All other systems reviewed and are otherwise negative except as noted above.  Physical Exam: Vitals:   02/24/20 1053  BP: 140/60  Pulse: 61  SpO2: 97%  Weight: 227 lb 12.8 oz (103.3 kg)  Height: 6\' 2"  (1.88 m)    GEN- The patient is well appearing, alert and oriented x 3 today.   HEENT: normocephalic, atraumatic; sclera clear, conjunctiva pink; hearing intact; oropharynx clear; neck supple, no JVP Lymph- no cervical lymphadenopathy Lungs- Clear to ausculation bilaterally, normal work of breathing.  No wheezes, rales, rhonchi Heart- Regular rate and rhythm, no murmurs, rubs or gallops, PMI not laterally displaced GI- soft, non-tender, non-distended, bowel sounds present, no hepatosplenomegaly Extremities- no clubbing, cyanosis, or edema; DP/PT/radial pulses 2+ bilaterally MS- no significant deformity or atrophy Skin- warm and dry, no rash or lesion Psych- euthymic mood, full affect Neuro- strength and sensation are intact  EKG is ordered. Personal review of EKG from today shows sinus rhythm at 61 bpm with normal intervals.  Assessment and Plan:  1. Paroxysmal Afib Continue eliquis for CHA2DS2VASC of at least 4   Denies symptoms.  He follows this with his BP machine. We have previously discussed Kardia device.  2. HTN Continue current medications We discussed increasing coreg to 12.5 mg BID, but he is not interested in changes today.  He states  his BP is under [id number] systolic consistently at home. He will monitor and call if BP begins to regularly get over 140.   3. CAD Denies anginal symptoms Continue ASA, statin, BB, and f/u with Dr. Debara Pickett Continue imdur  4. Chronic Diastolic CHF Volume status stable on exam NYHA I-II symptoms Continue lasix 20 mg daily. Had physical with PCP last week. I have asked him to have his labs forwarded to [place]  RTC 6 months to see Dr. [name]. Sooner with symptoms.   Shirley Friar, PA-C  02/24/20 11:15 AM

## 2020-02-24 ENCOUNTER — Other Ambulatory Visit: Payer: Self-pay

## 2020-02-24 ENCOUNTER — Ambulatory Visit (INDEPENDENT_AMBULATORY_CARE_PROVIDER_SITE_OTHER): Payer: Medicare Other | Admitting: Student

## 2020-02-24 ENCOUNTER — Encounter: Payer: Self-pay | Admitting: Student

## 2020-02-24 VITALS — BP 140/60 | HR 61 | Ht 74.0 in | Wt 227.8 lb

## 2020-02-24 DIAGNOSIS — I480 Paroxysmal atrial fibrillation: Secondary | ICD-10-CM

## 2020-02-24 DIAGNOSIS — I2510 Atherosclerotic heart disease of native coronary artery without angina pectoris: Secondary | ICD-10-CM | POA: Diagnosis not present

## 2020-02-24 DIAGNOSIS — I10 Essential (primary) hypertension: Secondary | ICD-10-CM | POA: Diagnosis not present

## 2020-02-24 DIAGNOSIS — I5032 Chronic diastolic (congestive) heart failure: Secondary | ICD-10-CM | POA: Diagnosis not present

## 2020-02-24 MED ORDER — IRBESARTAN 300 MG PO TABS: ORAL_TABLET | ORAL | 3 refills | Status: AC

## 2020-02-24 NOTE — Patient Instructions (Signed)
Medication Instructions:  NONE *If you need a refill on your cardiac medications before your next appointment, please call your pharmacy*   Lab Work: NONE If you have labs (blood work) drawn today and your tests are completely normal, you will receive your results only by: Marland Kitchen MyChart Message (if you have MyChart) OR . A paper copy in the mail If you have any lab test that is abnormal or we need to change your treatment, we will call you to review the results.   Testing/Procedures: NONE   Follow-Up: At [facility], you and your health needs are our priority.  As part of our continuing mission to provide you with exceptional heart care, we have created designated Provider Care Teams.  These Care Teams include your primary Cardiologist (physician) and Advanced Practice Providers (APPs -  Physician Assistants and Nurse Practitioners) who all work together to provide you with the care you need, when you need it.  We recommend signing up for the patient portal called "MyChart".  Sign up information is provided on this After Visit Summary.  MyChart is used to connect with patients for Virtual Visits (Telemedicine).  Patients are able to view lab/test results, encounter notes, upcoming appointments, etc.  Non-urgent messages can be sent to your provider as well.   To learn more about what you can do with MyChart, go to [email or web address].    Your next appointment:   6 month(s)  The format for your next appointment:   Either In Person or Virtual  Provider:   Dr [name]   Other Instructions

## 2020-09-07 ENCOUNTER — Ambulatory Visit: Payer: Medicare Other | Admitting: Student

## 2020-09-10 NOTE — Progress Notes (Signed)
PCP:  [name], MD Primary Cardiologist: [name], MD Electrophysiologist: Constance Haw, MD   Rodney [name] is a 79 y.o. male seen today for Will Meredith Leeds, MD for routine electrophysiology followup.  Since last being seen in our clinic the patient reports doing very well. He has some fatigue. He is able to play a round of golf without being too tired the next day. Otherwise not very active. He says he'll feel "four or five" hours of palpitations every 3-4 months. His HRs don't get elevated so he hasn't needed to take diltiazem. he denies chest pain, dyspnea, PND, orthopnea, nausea, vomiting, dizziness, syncope, edema, weight gain, or early satiety.  Past Medical History:  Diagnosis Date  . AAA (abdominal aortic aneurysm) ([place])    stent graft put in 02/2009  . Arthritis   . CAD (coronary artery disease)    a. cath 2010 b. CAD s/p PCI to ramus and LCX on 03/04/15  . Gout   . Hyperlipidemia   . Hypertension   . OSA (obstructive sleep apnea) 09/30/2015   Very mild with AHI 6.2/hr  . Paroxysmal atrial fibrillation ([facility])   . Tobacco abuse    Past Surgical History:  Procedure Laterality Date  . ABDOMINAL AORTIC ANEURYSM REPAIR  02/19/2009   performed by [facility]  . ATRIAL FIBRILLATION ABLATION N/A 04/28/2017   Procedure: Atrial Fibrillation Ablation;  Surgeon: Constance Haw, MD;  Location: [place] [facility];  Service: Cardiovascular;  Laterality: N/A;  . COLONOSCOPY    . KNEE ARTHROSCOPY  2003   left  . LEFT AND RIGHT HEART CATHETERIZATION WITH CORONARY ANGIOGRAM N/A 03/03/2015   Procedure: LEFT AND RIGHT HEART CATHETERIZATION WITH CORONARY ANGIOGRAM;  Surgeon: Troy Sine, MD;    . PERCUTANEOUS CORONARY STENT INTERVENTION (PCI-S) N/A 03/04/2015   Procedure: PERCUTANEOUS CORONARY STENT INTERVENTION (PCI-S);  Surgeon: Troy Sine, MD; RI 99>>0% w/   BMS, bifurcation CFX-OM 90>>40% w/ Angiosculpt PTCA; dCFX 90>>0% w/   2.7520 mm Rebel BMS  . SHOULDER  ARTHROSCOPY WITH ROTATOR CUFF REPAIR AND SUBACROMIAL DECOMPRESSION Left 11/19/2013   Procedure: LEFT SHOULDER ARTHROSCOPY DEBRIDEMENT EXTENTSIVE DISTAL CLAVICULECTOMY DECOMPRESSION PARTIAL ACROMIOPLASTY WITH CORACOACROMIAL WITH ROTATOR CUFF REPAIR ;  Surgeon: Renette Butters, MD;  Location: [place];  Service: Orthopedics;  Laterality: Left;    Current Outpatient Medications  Medication Sig Dispense Refill  . amLODipine (NORVASC) 5 MG tablet Take 1 tablet (5 mg total) daily by mouth. 90 tablet 3  . apixaban (ELIQUIS) 5 MG TABS tablet Take 1 tablet (5 mg total) by mouth 2 (two) times daily. 180 tablet 3  . aspirin EC 81 MG tablet Take 81 mg by mouth daily.    Marland Kitchen atorvastatin (LIPITOR) 80 MG tablet Take 1 tablet (80 mg total) by mouth daily. 90 tablet 3  . carvedilol (COREG) 6.25 MG tablet Take 1.5 tablets (9.375 mg total) by mouth 2 (two) times daily with a meal. 90 tablet 3  . diltiazem (CARDIZEM) 30 MG tablet Take 1 tablet (30 mg total) by mouth 4 (four) times daily as needed (irregular heart rate >100 and BP>100). 45 tablet 2  . famotidine (PEPCID) 40 MG tablet Take 1 tablet (40 mg total) by mouth 2 (two) times daily. 180 tablet 2  . fish oil-omega-3 fatty acids 1000 MG capsule Take 1,000 mg by mouth daily.     . furosemide (LASIX) 20 MG tablet Take 20 mg by mouth daily.    . irbesartan (AVAPRO) 300  MG tablet TAKE 1/2 TABLET(150 MG) BY MOUTH DAILY 90 tablet 3  . isosorbide mononitrate (IMDUR) 60 MG 24 hr tablet TAKE 1 TABLET BY MOUTH EVERY DAY ( CAN NOT BE FILL 06.04.2018) 90 tablet 2  . KLOR-CON M10 10 MEQ tablet TAKE 1 TABLET BY MOUTH TWICE A DAY 180 tablet 3  . loratadine (CLARITIN) 10 MG tablet Take 10 mg by mouth daily as needed (seasonal allergies).     . Multiple Vitamin (MULTIVITAMIN WITH MINERALS) TABS tablet Take 1 tablet by mouth daily.    . nitroGLYCERIN (NITROSTAT) 0.4 MG SL tablet Place 1 tablet (0.4 mg total) under the tongue every 5 (five) minutes as needed for  chest pain (shortness of breath). 25 tablet 3   No current facility-administered medications for this visit.    Allergies  Allergen Reactions  . Lisinopril Cough  . Iohexol Itching, Rash and Other (See Comments)     Code: RASH, Desc: PATIENT STATED THAT HE BEGAN ITCHING TOWARDS END OF INJECTION OF IV CONTRAST-- 13 HR PREP RECOMMENDED/MMS     Social History   Socioeconomic History  . Marital status: Married    Spouse name: Not on file  . Number of children: Not on file  . Years of education: Not on file  . Highest education level: Not on file  Occupational History  . Not on file  Tobacco Use  . Smoking status: Former Smoker    Packs/day: 0.50    Years: 50.00    Pack years: 25.00    Types: Cigarettes    Quit date: 03/01/2016    Years since quitting: 4.5  . Smokeless tobacco: Never Used  Vaping Use  . Vaping Use: Every day  Substance and Sexual Activity  . Alcohol use: Yes    Alcohol/week: 7.0 - 8.0 standard drinks    Types: 6 Cans of beer, 1 - 2 Shots of liquor per week    Comment: couple shots of royal per day  . Drug use: No  . Sexual activity: Not Currently    Comment: cutting down 1/2 ppd  Other Topics Concern  . Not on file  Social History Narrative   Lives with wife, does not use assist, drives.  No home health services.     Social Determinants of Health   Financial Resource Strain:   . Difficulty of Paying Living Expenses: Not on file  Food Insecurity:   . Worried About Charity fundraiser in the Last Year: Not on file  . Ran Out of Food in the Last Year: Not on file  Transportation Needs:   . Lack of Transportation (Medical): Not on file  . Lack of Transportation (Non-Medical): Not on file  Physical Activity:   . Days of Exercise per Week: Not on file  . Minutes of Exercise per Session: Not on file  Stress:   . Feeling of Stress : Not on file  Social Connections:   . Frequency of Communication with Friends and Family: Not on file  . Frequency of  Social Gatherings with Friends and Family: Not on file  . Attends Religious Services: Not on file  . Active Member of Clubs or Organizations: Not on file  . Attends Archivist Meetings: Not on file  . Marital Status: Not on file  Intimate Partner Violence:   . Fear of Current or Ex-Partner: Not on file  . Emotionally Abused: Not on file  . Physically Abused: Not on file  . Sexually Abused: Not on file  Review of Systems: General: No chills, fever, night sweats or weight changes  Cardiovascular:  No chest pain, dyspnea on exertion, edema, orthopnea, palpitations, paroxysmal nocturnal dyspnea Dermatological: No rash, lesions or masses Respiratory: No cough, dyspnea Urologic: No hematuria, dysuria Abdominal: No nausea, vomiting, diarrhea, bright red blood per rectum, melena, or hematemesis Neurologic: No visual changes, weakness, changes in mental status All other systems reviewed and are otherwise negative except as noted above.  Physical Exam: There were no vitals filed for this visit.  GEN- The patient is well appearing, alert and oriented x 3 today.   HEENT: normocephalic, atraumatic; sclera clear, conjunctiva pink; hearing intact; oropharynx clear; neck supple, no JVP Lymph- no cervical lymphadenopathy Lungs- Clear to ausculation bilaterally, normal work of breathing.  No wheezes, rales, rhonchi Heart- Regular rate and rhythm, no murmurs, rubs or gallops, PMI not laterally displaced GI- soft, non-tender, non-distended, bowel sounds present, no hepatosplenomegaly Extremities- no clubbing, cyanosis, or edema; DP/PT/radial pulses 2+ bilaterally MS- no significant deformity or atrophy Skin- warm and dry, no rash or lesion Psych- euthymic mood, full affect Neuro- strength and sensation are intact  EKG is ordered. Personal review of EKG from today shows sinus bradycardia at 57 bpm.  Additional studies reviewed include: Previous EP office notes  Assessment and  Plan:  1. Paroxysmal AF EKG today shows sinus brady. Continue eliquis for CHA2DS2VASC of at least 7.   Continue as needed dilt. Hasn't needed any.  2. HTN Continue current medications  3. CAD Denies anginal symptoms  4. Chronic diastolic CHF Volume status stable on exam.  Continue lasix 20 mg daily.  Labs today.   RTC 9 months. He would like to get on a schedule where he alternates visits with [place] and the [place] every 6 months, and that timing should accomplish this. Sooner with symptoms.   Shirley Friar, PA-C  09/10/20 10:24 AM

## 2020-09-11 ENCOUNTER — Encounter: Payer: Self-pay | Admitting: Student

## 2020-09-11 ENCOUNTER — Other Ambulatory Visit: Payer: Self-pay

## 2020-09-11 ENCOUNTER — Ambulatory Visit (INDEPENDENT_AMBULATORY_CARE_PROVIDER_SITE_OTHER): Payer: Medicare Other | Admitting: Student

## 2020-09-11 VITALS — BP 140/70 | HR 57 | Ht 74.0 in | Wt 217.0 lb

## 2020-09-11 DIAGNOSIS — I480 Paroxysmal atrial fibrillation: Secondary | ICD-10-CM

## 2020-09-11 DIAGNOSIS — I10 Essential (primary) hypertension: Secondary | ICD-10-CM

## 2020-09-11 DIAGNOSIS — I5032 Chronic diastolic (congestive) heart failure: Secondary | ICD-10-CM

## 2020-09-11 DIAGNOSIS — I2510 Atherosclerotic heart disease of native coronary artery without angina pectoris: Secondary | ICD-10-CM

## 2020-09-11 LAB — BASIC METABOLIC PANEL
BUN/Creatinine Ratio: 10 (ref 10–24)
BUN: 12 mg/dL (ref 8–27)
CO2: 19 mmol/L — ABNORMAL LOW (ref 20–29)
Calcium: 8.9 mg/dL (ref 8.6–10.2)
Chloride: 108 mmol/L — ABNORMAL HIGH (ref 96–106)
Creatinine, Ser: 1.18 mg/dL (ref 0.76–1.27)
GFR calc Af Amer: 68 mL/min/{1.73_m2} (ref >59)
GFR calc non Af Amer: 59 mL/min/{1.73_m2} — ABNORMAL LOW (ref >59)
Glucose: 123 mg/dL — ABNORMAL HIGH (ref 65–99)
Potassium: 4.2 mmol/L (ref 3.5–5.2)
Sodium: 142 mmol/L (ref 134–144)

## 2020-09-11 LAB — CBC WITH DIFFERENTIAL/PLATELET
Basophils Absolute: 0 10*3/uL (ref 0.0–0.2)
Basos: 0 %
EOS (ABSOLUTE): 0.1 10*3/uL (ref 0.0–0.4)
Eos: 2 %
Hematocrit: 45.3 % (ref 37.5–51.0)
Hemoglobin: 15.1 g/dL (ref 13.0–17.7)
Immature Grans (Abs): 0 10*3/uL (ref 0.0–0.1)
Immature Granulocytes: 0 %
Lymphocytes Absolute: 1.4 10*3/uL (ref 0.7–3.1)
Lymphs: 37 %
MCH: 31.9 pg (ref 26.6–33.0)
MCHC: 33.3 g/dL (ref 31.5–35.7)
MCV: 96 fL (ref 79–97)
Monocytes Absolute: 0.3 10*3/uL (ref 0.1–0.9)
Monocytes: 8 %
Neutrophils Absolute: 1.9 10*3/uL (ref 1.4–7.0)
Neutrophils: 53 %
Platelets: 119 10*3/uL — ABNORMAL LOW (ref 150–450)
RBC: 4.73 x10E6/uL (ref 4.14–5.80)
RDW: 12.7 % (ref 11.6–15.4)
WBC: 3.7 10*3/uL (ref 3.4–10.8)

## 2020-09-11 NOTE — Patient Instructions (Addendum)
Medication Instructions:  *If you need a refill on your cardiac medications before your next appointment, please call your pharmacy*  Lab Work: Your physician has recommended that you have lab work today: BMET and CBC  If you have labs (blood work) drawn today and your tests are completely normal, you will receive your results only by: Marland Kitchen MyChart Message (if you have MyChart) OR . A paper copy in the mail If you have any lab test that is abnormal or we need to change your treatment, we will call you to review the results.  Follow-Up: At [facility], you and your health needs are our priority.  As part of our continuing mission to provide you with exceptional heart care, we have created designated Provider Care Teams.  These Care Teams include your primary Cardiologist (physician) and Advanced Practice Providers (APPs -  Physician Assistants and Nurse Practitioners) who all work together to provide you with the care you need, when you need it.  We recommend signing up for the patient portal called "MyChart".  Sign up information is provided on this After Visit Summary.  MyChart is used to connect with patients for Virtual Visits (Telemedicine).  Patients are able to view lab/test results, encounter notes, upcoming appointments, etc.  Non-urgent messages can be sent to your provider as well.   To learn more about what you can do with MyChart, go to [email or web address].    Your next appointment:   Your physician wants you to follow-up in: 64 MONTHS with Dr. [name]. You will receive a reminder letter in the mail two months in advance. If you don't receive a letter, please call our office to schedule the follow-up appointment.  The format for your next appointment:   In person with Allegra Lai, MD

## 2020-09-15 NOTE — Addendum Note (Signed)
Addended by: Jeremy Johann on: 09/15/2020 04:33 PM   Modules accepted: Orders

## 2021-01-11 ENCOUNTER — Telehealth: Payer: Self-pay | Admitting: Cardiology

## 2021-01-11 ENCOUNTER — Other Ambulatory Visit: Payer: Self-pay

## 2021-01-11 ENCOUNTER — Encounter: Payer: Self-pay | Admitting: Internal Medicine

## 2021-01-11 ENCOUNTER — Ambulatory Visit (INDEPENDENT_AMBULATORY_CARE_PROVIDER_SITE_OTHER): Payer: Medicare Other | Admitting: Internal Medicine

## 2021-01-11 VITALS — BP 112/70 | HR 65 | Ht 74.0 in | Wt 213.0 lb

## 2021-01-11 DIAGNOSIS — R079 Chest pain, unspecified: Secondary | ICD-10-CM

## 2021-01-11 DIAGNOSIS — I480 Paroxysmal atrial fibrillation: Secondary | ICD-10-CM | POA: Diagnosis not present

## 2021-01-11 DIAGNOSIS — I2510 Atherosclerotic heart disease of native coronary artery without angina pectoris: Secondary | ICD-10-CM | POA: Diagnosis not present

## 2021-01-11 MED ORDER — NITROGLYCERIN 0.4 MG SL SUBL: 0.4000 mg | SUBLINGUAL_TABLET | SUBLINGUAL | 3 refills | Status: DC | PRN

## 2021-01-11 MED ORDER — DILTIAZEM HCL 30 MG PO TABS: 30.0000 mg | ORAL_TABLET | Freq: Four times a day (QID) | ORAL | 2 refills | Status: DC | PRN

## 2021-01-11 NOTE — Progress Notes (Signed)
OFFICE NOTE  Chief Complaint:  Chest pain  Primary Care Physician: [name], MD  HPI:  Rodney [name] is a 80 y.o. male with a past medical history significant for CAD, hypertension, dyslipidemia, and AAA s/p stent-grafting in 2012 by Dr. Kellie Simmering. Preoperatively, he underwent a stress test which was abnormal, showing an inferior defect consistent with possible scar and mild to moderate LV dysfunction. He had a LHC, which revealed the following:  ANGIOGRAPHY: Left main: The left main is fairly normal.  The left anterior descending artery has some proximal luminal irregularities between 20 and 30%. The mid vessel stenosis around 30- 40%. It gives off several small diagonal arteries. The first diagonal artery is moderate in size and has mild irregularities. The second diagonal artery is very tiny and has an 80% proximal stenosis. This vessel is quite small and is not a candidate for PTCA.  The remaining LAD has only minor luminal irregularities.  The left circumflex artery is a large vessel. It gives off a very high obtuse marginal artery. The remaining circumflex artery continues around the A-V groove and supplies a posterolateral branch.  The first obtuse marginal artery is subtotally occluded at 2 different sites. There was sluggish TIMI grade 1 flow through this vessel. This vessel reaches around the lateral wall and supplies a small amount of the inferior left ventricle. This was quite likely where the site of the abnormal Cardiolite.  The right coronary artery is large and dominant. There are minor luminal irregularities. The posterior descending artery and the posterolateral segment artery, they taper fairly quickly at the takeoff. This taper improved slightly with intracoronary nitroglycerin. There are only minor luminal irregularities between 20 and 30%.  The left ventriculogram was performed in a 30-RAO position. It  reveals a moderately dilated and hypocontractile left ventricle. The ejection fraction is probably 40%.  During this left ventriculogram, we noticed that he had a very dilated aortic root. We performed an aortic root angiography, which revealed a dilated aortic root. There was no evidence of aortic insufficiency.  He now presents with accelerated hypertension and dyspnea, but no chest pain -troponin is borderline elevated at 0.05 and 0.07, BNP is elevated at 331. CXR demonstrates pulmonary edema. EKG shows sinus rhythm with LVH and lateral repolarization changes. Cardiology is asked to consult regarding management.  [name] was recently hospitalized and treated for heart failure. He started on diuretic and we discontinued his nifedipine and switched him to carvedilol. He recently followed up with his primary care provider who noted he was in new onset atrial fibrillation. It's feasible that atrial fibrillation was the precipitating factor for his heart failure. He seems to be unaware of his atrial fibrillation. A repeat EKG in our office today shows that he is converted back to sinus rhythm but he does have deep anterolateral and inferior T-wave inversions. These could be due to repolarization secondary to LVH however given his history of known coronary artery disease, could represent ischemia.  I saw [name] back in the office today. He underwent a nuclear stress test which was interpreted as follows:  Overall Impression: High risk stress nuclear study due to  severely decreased LV function (EF 28% - down from 445). There is a moderate inferior wall scar. No reversible ischemia is seen.. Consider multivessel CAD  versus adverse post-infarction remodeling as cause of worsening  Cardiomyopathy.  [name] returns today for follow-up. Recently he underwent cardiac catheterization and underwent stenting to the ramus and circumflex  as follows:  Difficult but successful complex  intervention involving PTCA/BM stenting of a subtotal ramus intermediate vessel with the entire proximal region being reduced to 0% in an intermediate vessel which extended to the LV apex once opened; and Angiosculpt/PTCA of bifurcation stenosis involving the distal circumflex flex marginal branch and Angiosculpt/BM stenting of a dominant distal circumflex extending into the PD/PLA vessel.  Subsequently he was noted to have increasing problems with palpitations and atrial fibrillation with rapid ventricular response. We elected to start him on dofetilide which he did well with however had some QT prolongation on the 500 g dose. Subsequently was placed on 250 g twice a day and is tolerating that well. Overall he feels well and has not had recurrence since discharge. He did feel subjective palpitations recently and went to the emergency room. He also was notably hypertensive. His rhythm was sinus. Blood pressure still is persistently elevated despite being on max dose irbesartan, carvedilol, Imdur and Lasix. He denies any chest pain. He successfully stop smoking and currently using nicotine patches but is having cravings.  [name] returns today for follow-up. Overall he seems to be doing fairly well. He exercises regularly and has gotten his weight down to his lowest wait which he has not seen a number of years. He reports very infrequent episodes of A. fib, maybe 2 or 3 over the past year which represents good control on Tikosyn. 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 This has not been assessed in the last year. He occasionally gets some bleeding of his gums which of encouraged him to follow-up with his dentist. He needs to stay on aspirin on a daily basis because of the coronary benefit. Recently he saw Roderic Palau, NP for ongoing follow-up of Tikosyn. His potassium was 3.9 and she encouraged him to take potassium supplements to try to get it above 4.0. Were requested a recheck his  potassium today.  04/24/2017  [name] returns today for hospital follow-up. He was hospitalized in April with recurrent A. fib. He was weaned off Tikosyn and started on amiodarone. He was seen by Dr. [name] for a-fib ablation. He reports having had a rash on 400 mg amiodarone but that resolved with decreasing the dose to 200 mg daily. He scheduled to see Dr. [name] tomorrow and have CT angiography with a planned A. fib ablation on Friday.  10/17/2017  Mr. Yoss returns today for follow-up.  He underwent A. fib ablation with Dr. [name] and had been on amiodarone.  That was discontinued this past summer and he reports generally very few episodes of palpitations.  He also has a history of mixed ischemic and nonischemic cardiomyopathy with EF as low as 35% the recently came up to 45-50% as of May 2017.  In general he feels well, denying any chest pain or worsening shortness of breath.  He continues to play golf and can play 18 holes without stopping.  Review of his medications indicate that he is no longer on a beta-blocker, which was likely stopped when he went on to amiodarone.   07/30/2018  Mr. Monaco was seen today in follow-up.  He was recently seen by myself in the hospital in consultation for chest pain.  He ruled out for MI.  He was not in A. fib but related that he had had some recurrent A. fib recently.  He has successfully undergone A. fib ablation with Dr. [name].  He had been taken off of amiodarone but is maintained  on Eliquis.  I recommended outpatient nuclear stress testing.  He did undergo that nuclear stress test yesterday which indicated a small fixed inferior defect, possibly scar or attenuation artifact.  LVEF however was reduced to 39%.  His most recent echo in November showed an EF of 50 to 55%.  He reports he is made a change in decreasing his alcohol intake and back to off on both nicotine and marijuana use.  01/11/2021  Mr. Flight returns today for follow-up.  I last saw him via  virtual visit last year.  Has been followed primarily by EP because of A. fib ablation.  He has done well on that has been off of antiarrhythmic medications, only using a beta-blocker and as needed calcium channel blocker.  In general he says his palpitations have been well controlled although the other day he reportedly took some extra diltiazem as his heart rate increased over 100.  Blood pressure recently has been labile as well and reportedly was 170/100.  He was added to my schedule today for 2 days of chest pain.  He describes it as a shooting sensation across his chest, however although atypical for angina was similar to his symptoms prior to his last stent in 2016.  He had Myoview stress testing last in 2019 which was negative for ischemia and his LVEF has recovered.  Blood pressure in the office today however was better at 112/70.  EKG personally reviewed shows anterolateral T wave inversions which are unchanged from prior EKGs.  PMHx:  Past Medical History:  Diagnosis Date  . AAA (abdominal aortic aneurysm) ([place])    stent graft put in 02/2009  . Arthritis   . CAD (coronary artery disease)    a. cath 2010 b. CAD s/p PCI to ramus and LCX on 03/04/15  . Gout   . Hyperlipidemia   . Hypertension   . OSA (obstructive sleep apnea) 09/30/2015   Very mild with AHI 6.2/hr  . Paroxysmal atrial fibrillation ([facility])   . Tobacco abuse     Past Surgical History:  Procedure Laterality Date  . ABDOMINAL AORTIC ANEURYSM REPAIR  02/19/2009   performed by [facility]  . ATRIAL FIBRILLATION ABLATION N/A 04/28/2017   Procedure: Atrial Fibrillation Ablation;  Surgeon: Constance Haw, MD;  Location: [facility];  Service: Cardiovascular;  Laterality: N/A;  . COLONOSCOPY    . KNEE ARTHROSCOPY  2003   left  . LEFT AND RIGHT HEART CATHETERIZATION WITH CORONARY ANGIOGRAM N/A 03/03/2015   Procedure: LEFT AND RIGHT HEART CATHETERIZATION WITH CORONARY ANGIOGRAM;  Surgeon: Troy Sine, MD;    . PERCUTANEOUS  CORONARY STENT INTERVENTION (PCI-S) N/A 03/04/2015   Procedure: PERCUTANEOUS CORONARY STENT INTERVENTION (PCI-S);  Surgeon: Troy Sine, MD; RI 99>>0% w/   BMS, bifurcation CFX-OM 90>>40% w/ Angiosculpt PTCA; dCFX 90>>0% w/   2.7520 mm Rebel BMS  . SHOULDER ARTHROSCOPY WITH ROTATOR CUFF REPAIR AND SUBACROMIAL DECOMPRESSION Left 11/19/2013   Procedure: LEFT SHOULDER ARTHROSCOPY DEBRIDEMENT EXTENTSIVE DISTAL CLAVICULECTOMY DECOMPRESSION PARTIAL ACROMIOPLASTY WITH CORACOACROMIAL WITH ROTATOR CUFF REPAIR ;  Surgeon: Renette Butters, MD;  Location: [facility];  Service: Orthopedics;  Laterality: Left;    FAMHx:  Family History  Problem Relation Age of Onset  . Prostate cancer Father   . Hypertension Mother   . Diabetes Sister   . Hypertension Sister   . Stroke Neg Hx   . Heart attack Neg Hx     SOCHx:   reports that he quit smoking about  4 years ago. His smoking use included cigarettes. He has a 25.00 pack-year smoking history. He has never used smokeless tobacco. He reports current alcohol use of about 7.0 - 8.0 standard drinks of alcohol per week. He reports that he does not use drugs.  ALLERGIES:  Allergies  Allergen Reactions  . Lisinopril Cough  . Iohexol Itching, Rash and Other (See Comments)     Code: RASH, Desc: PATIENT STATED THAT HE BEGAN ITCHING TOWARDS END OF INJECTION OF IV CONTRAST-- 13 HR PREP RECOMMENDED/MMS     ROS: Pertinent items noted in HPI and remainder of comprehensive ROS otherwise negative.  HOME MEDS: Current Outpatient Medications  Medication Sig Dispense Refill  . amLODipine (NORVASC) 5 MG tablet Take 1 tablet (5 mg total) daily by mouth. 90 tablet 3  . apixaban (ELIQUIS) 5 MG TABS tablet Take 1 tablet (5 mg total) by mouth 2 (two) times daily. 180 tablet 3  . aspirin EC 81 MG tablet Take 81 mg by mouth daily.    Marland Kitchen atorvastatin (LIPITOR) 80 MG tablet Take 1 tablet (80 mg total) by mouth daily. 90 tablet 3  . carvedilol (COREG) 6.25 MG  tablet Take 1.5 tablets (9.375 mg total) by mouth 2 (two) times daily with a meal. 90 tablet 3  . famotidine (PEPCID) 40 MG tablet Take 1 tablet (40 mg total) by mouth 2 (two) times daily. 180 tablet 2  . fish oil-omega-3 fatty acids 1000 MG capsule Take 1,000 mg by mouth daily.     . furosemide (LASIX) 20 MG tablet Take 20 mg by mouth daily.    . irbesartan (AVAPRO) 300 MG tablet TAKE 1/2 TABLET(150 MG) BY MOUTH DAILY 90 tablet 3  . isosorbide mononitrate (IMDUR) 60 MG 24 hr tablet TAKE 1 TABLET BY MOUTH EVERY DAY ( CAN NOT BE FILL 06.04.2018) 90 tablet 2  . KLOR-CON M10 10 MEQ tablet TAKE 1 TABLET BY MOUTH TWICE A DAY 180 tablet 3  . loratadine (CLARITIN) 10 MG tablet Take 10 mg by mouth daily as needed (seasonal allergies).     . Multiple Vitamin (MULTIVITAMIN WITH MINERALS) TABS tablet Take 1 tablet by mouth daily.    Marland Kitchen diltiazem (CARDIZEM) 30 MG tablet Take 1 tablet (30 mg total) by mouth 4 (four) times daily as needed (irregular heart rate >100 and BP>100). 45 tablet 2  . nitroGLYCERIN (NITROSTAT) 0.4 MG SL tablet Place 1 tablet (0.4 mg total) under the tongue every 5 (five) minutes as needed for chest pain (shortness of breath). 25 tablet 3   No current facility-administered medications for this visit.    LABS/IMAGING: No results found for this or any previous visit (from the past 48 hour(s)). No results found.  VITALS: BP 112/70 (BP Location: Left Arm, Patient Position: Sitting)   Pulse 65   Ht 6\' 2"  (1.88 m)   Wt 213 lb (96.6 kg)   SpO2 95%   BMI 27.35 kg/m   EXAM: General appearance: alert and no distress Neck: no carotid bruit, no JVD and thyroid not enlarged, symmetric, no tenderness/mass/nodules Lungs: clear to auscultation bilaterally Heart: regular rate and rhythm Abdomen: soft, non-tender; bowel sounds normal; no masses,  no organomegaly Extremities: extremities normal, atraumatic, no cyanosis or edema Pulses: 2+ and symmetric Skin: Skin color, texture, turgor  normal. No rashes or lesions Neurologic: Grossly normal Psych: Pleasant  EKG: Sinus rhythm at 65 with PACs, anterolateral T wave inversions (stable)-personally reviewed  ASSESSMENT: 1. Recent chest pain with an intermediate risk Myoview stress test,  LVEF 39% (07/2018) 2. CAD s/p PCI to the ramus intermedius and distal circumflex arteries (02/2015) 3. Ischemic cardiomyopathy EF 35-40%  - improved to 50-55% on 10/2017 4. History of systolic and diastolic congestive heart failure 5. Accelerated hypertension 6. History of abdominal aortic aneurysm repair 7. Paroxysmal atrial fibrillation - CHADSVASC 4, s/p ablation (2017)  PLAN: 1.   Mr. Senne is describing more chest pain.  Is possible this could be some breakthrough A. fib as he had to take diltiazem the other day but generally he has done well since his ablation.  He does have a coronary history and his last stress test was in 2019.  He did show a fixed defect at that time.  I like to repeat that Myoview this week and further determinations will be based on that.  Follow-up with me afterwards.  [name], MD, [facility], [place] Director of the Advanced Lipid Disorders &  Cardiovascular Risk Reduction Clinic Diplomate of the American Board of Clinical Lipidology Attending Cardiologist  Direct Dial: [phone]  Fax: [phone]  Website:  [email or web address] 01/11/2021, 1:29 PM

## 2021-01-11 NOTE — Telephone Encounter (Signed)
Patient states he has been having intermittent chest discomfort for the past two days. States he has not been able to be active due to increased SOB. Patient reports that he is not currently having any chest discomfort but when he does it radiates across his chest. States he has been having elevated BP since yesterday. Most recent this morning 175/100 with HR 100. Patient has taken PRN Diltiazem. Patient denies N/V, diaphoresis or dizziness. Patient requesting to be seen in office to address HTN. Appt made with Dr. Debara Pickett at 10am this morning.

## 2021-01-11 NOTE — Telephone Encounter (Signed)
Pt c/o of Chest Pain: STAT if CP now or developed within 24 hours  1. Are you having CP right now? yes  2. Are you experiencing any other symptoms (ex. SOB, nausea, vomiting, sweating)? Little SOB  3. How long have you been experiencing CP? Couple days  4. Is your CP continuous or coming and going? Comes and goes  5. Have you taken Nitroglycerin? no   Patient states he has been having chest discomfort for a couple days. He states his BP is also not regulating. He states it was 175/100 HR 100 and took diltiazem.   ?

## 2021-01-11 NOTE — Patient Instructions (Signed)
Medication Instructions:  Your physician recommends that you continue on your current medications as directed. Please refer to the Current Medication list given to you today.  *If you need a refill on your cardiac medications before your next appointment, please call your pharmacy*  Testing/Procedures: Dr. Debara Pickett has ordered a Lexiscan Myocardial Perfusion Imaging Study.  Please arrive 15 minutes prior to your appointment time for registration and insurance purposes.   The test will take approximately 3 to 4 hours to complete; you may bring reading material.  If someone comes with you to your appointment, they will need to remain in the main lobby due to limited space in the testing area. **If you are pregnant or breastfeeding, please notify the nuclear lab prior to your appointment**   How to prepare for your Myocardial Perfusion Test:  Do not eat or drink 3 hours prior to your test, except you may have water.  Do not consume products containing caffeine (regular or decaffeinated) 12 hours prior to your test. (ex: coffee, chocolate, sodas, tea).  Do wear comfortable clothes (no dresses or overalls) and walking shoes, tennis shoes preferred (No heels or open toe shoes are allowed).  Do NOT wear cologne, perfume, aftershave, or lotions (deodorant is allowed).  If you use an inhaler, use it the AM of your test and bring it with you.   If you use a nebulizer, use it the AM of your test.   If these instructions are not followed, your test will have to be rescheduled.    Follow-Up: At [facility], you and your health needs are our priority.  As part of our continuing mission to provide you with exceptional heart care, we have created designated Provider Care Teams.  These Care Teams include your primary Cardiologist (physician) and Advanced Practice Providers (APPs -  Physician Assistants and Nurse Practitioners) who all work together to provide you with the care you need, when you need  it.  We recommend signing up for the patient portal called "MyChart".  Sign up information is provided on this After Visit Summary.  MyChart is used to connect with patients for Virtual Visits (Telemedicine).  Patients are able to view lab/test results, encounter notes, upcoming appointments, etc.  Non-urgent messages can be sent to your provider as well.   To learn more about what you can do with MyChart, go to [email or web address].    Your next appointment:   4 week(s)  The format for your next appointment:   In Person  Provider:   Dr. Lyman Bishop or one of the Advanced Practice Providers on his care team: - [place] PA - Roby Lofts PA   Other Instructions

## 2021-01-13 ENCOUNTER — Telehealth (HOSPITAL_COMMUNITY): Payer: Self-pay | Admitting: *Deleted

## 2021-01-13 NOTE — Telephone Encounter (Signed)
Close encounter 

## 2021-01-14 ENCOUNTER — Ambulatory Visit (HOSPITAL_COMMUNITY)
Admission: RE | Admit: 2021-01-14 | Discharge: 2021-01-14 | Disposition: A | Discharge status: Home / Self Care | Payer: Medicare Other | Source: Ambulatory Visit | Attending: Cardiovascular Disease | Admitting: Cardiovascular Disease

## 2021-01-14 ENCOUNTER — Other Ambulatory Visit: Payer: Self-pay

## 2021-01-14 DIAGNOSIS — R079 Chest pain, unspecified: Secondary | ICD-10-CM | POA: Insufficient documentation

## 2021-01-14 DIAGNOSIS — I2510 Atherosclerotic heart disease of native coronary artery without angina pectoris: Secondary | ICD-10-CM | POA: Diagnosis present

## 2021-01-14 LAB — MYOCARDIAL PERFUSION IMAGING
LV dias vol: 128 mL (ref 62–150)
LV sys vol: 66 mL
Peak HR: 80 {beats}/min
Rest HR: 53 {beats}/min
SDS: 3
SRS: 6
SSS: 9
TID: 0.89

## 2021-01-14 MED ORDER — REGADENOSON 0.4 MG/5ML IV SOLN
0.4000 mg | Freq: Once | INTRAVENOUS | Status: AC
  Administered 2021-01-14: 0.4 mg via INTRAVENOUS

## 2021-01-14 MED ORDER — TECHNETIUM TC 99M TETROFOSMIN IV KIT
32.7000 | PACK | Freq: Once | INTRAVENOUS | Status: AC | PRN
  Administered 2021-01-14: 32.7 via INTRAVENOUS
  Filled 2021-01-14: qty 33

## 2021-01-14 MED ORDER — TECHNETIUM TC 99M TETROFOSMIN IV KIT
10.5000 | PACK | Freq: Once | INTRAVENOUS | Status: AC | PRN
  Administered 2021-01-14: 10.5 via INTRAVENOUS
  Filled 2021-01-14: qty 11

## 2021-03-08 ENCOUNTER — Encounter: Payer: Self-pay | Admitting: Internal Medicine

## 2021-03-08 ENCOUNTER — Ambulatory Visit: Payer: Medicare Other | Admitting: Internal Medicine

## 2021-03-08 ENCOUNTER — Other Ambulatory Visit: Payer: Self-pay

## 2021-03-08 VITALS — BP 130/68 | HR 58 | Ht 74.0 in | Wt 217.0 lb

## 2021-03-08 DIAGNOSIS — R079 Chest pain, unspecified: Secondary | ICD-10-CM | POA: Diagnosis not present

## 2021-03-08 DIAGNOSIS — I2510 Atherosclerotic heart disease of native coronary artery without angina pectoris: Secondary | ICD-10-CM

## 2021-03-08 DIAGNOSIS — I480 Paroxysmal atrial fibrillation: Secondary | ICD-10-CM

## 2021-03-08 DIAGNOSIS — I5042 Chronic combined systolic (congestive) and diastolic (congestive) heart failure: Secondary | ICD-10-CM | POA: Diagnosis not present

## 2021-03-08 MED ORDER — CARVEDILOL 12.5 MG PO TABS: 12.5000 mg | ORAL_TABLET | Freq: Two times a day (BID) | ORAL | 3 refills | Status: DC

## 2021-03-08 NOTE — Patient Instructions (Signed)

## 2021-03-08 NOTE — Progress Notes (Signed)
OFFICE NOTE  Chief Complaint:  Follow-up chest pain  Primary Care Physician: [name], MD  HPI:  Rodney [name] is a 80 y.o. male with a past medical history significant for CAD, hypertension, dyslipidemia, and AAA s/p stent-grafting in 2012 by Dr. Kellie Simmering. Preoperatively, he underwent a stress test which was abnormal, showing an inferior defect consistent with possible scar and mild to moderate LV dysfunction. He had a LHC, which revealed the following:  ANGIOGRAPHY: Left main: The left main is fairly normal.  The left anterior descending artery has some proximal luminal irregularities between 20 and 30%. The mid vessel stenosis around 30- 40%. It gives off several small diagonal arteries. The first diagonal artery is moderate in size and has mild irregularities. The second diagonal artery is very tiny and has an 80% proximal stenosis. This vessel is quite small and is not a candidate for PTCA.  The remaining LAD has only minor luminal irregularities.  The left circumflex artery is a large vessel. It gives off a very high obtuse marginal artery. The remaining circumflex artery continues around the A-V groove and supplies a posterolateral branch.  The first obtuse marginal artery is subtotally occluded at 2 different sites. There was sluggish TIMI grade 1 flow through this vessel. This vessel reaches around the lateral wall and supplies a small amount of the inferior left ventricle. This was quite likely where the site of the abnormal Cardiolite.  The right coronary artery is large and dominant. There are minor luminal irregularities. The posterior descending artery and the posterolateral segment artery, they taper fairly quickly at the takeoff. This taper improved slightly with intracoronary nitroglycerin. There are only minor luminal irregularities between 20 and 30%.  The left ventriculogram was performed in a 30-RAO  position. It reveals a moderately dilated and hypocontractile left ventricle. The ejection fraction is probably 40%.  During this left ventriculogram, we noticed that he had a very dilated aortic root. We performed an aortic root angiography, which revealed a dilated aortic root. There was no evidence of aortic insufficiency.  He now presents with accelerated hypertension and dyspnea, but no chest pain -troponin is borderline elevated at 0.05 and 0.07, BNP is elevated at 331. CXR demonstrates pulmonary edema. EKG shows sinus rhythm with LVH and lateral repolarization changes. Cardiology is asked to consult regarding management.  Rodney [name] was recently hospitalized and treated for heart failure. He started on diuretic and we discontinued his nifedipine and switched him to carvedilol. He recently followed up with his primary care provider who noted he was in new onset atrial fibrillation. It's feasible that atrial fibrillation was the precipitating factor for his heart failure. He seems to be unaware of his atrial fibrillation. A repeat EKG in our office today shows that he is converted back to sinus rhythm but he does have deep anterolateral and inferior T-wave inversions. These could be due to repolarization secondary to LVH however given his history of known coronary artery disease, could represent ischemia.  I saw Mr. Witherington back in the office today. He underwent a nuclear stress test which was interpreted as follows:  Overall Impression: High risk stress nuclear study due to  severely decreased LV function (EF 28% - down from 445). There is a moderate inferior wall scar. No reversible ischemia is seen.. Consider multivessel CAD  versus adverse post-infarction remodeling as cause of worsening  Cardiomyopathy.  Rodney [name] returns today for follow-up. Recently he underwent cardiac catheterization and underwent stenting to the ramus and  circumflex as follows:  Difficult but successful  complex intervention involving PTCA/BM stenting of a subtotal ramus intermediate vessel with the entire proximal region being reduced to 0% in an intermediate vessel which extended to the LV apex once opened; and Angiosculpt/PTCA of bifurcation stenosis involving the distal circumflex flex marginal branch and Angiosculpt/BM stenting of a dominant distal circumflex extending into the PD/PLA vessel.  Subsequently he was noted to have increasing problems with palpitations and atrial fibrillation with rapid ventricular response. We elected to start him on dofetilide which he did well with however had some QT prolongation on the 500 g dose. Subsequently was placed on 250 g twice a day and is tolerating that well. Overall he feels well and has not had recurrence since discharge. He did feel subjective palpitations recently and went to the emergency room. He also was notably hypertensive. His rhythm was sinus. Blood pressure still is persistently elevated despite being on max dose irbesartan, carvedilol, Imdur and Lasix. He denies any chest pain. He successfully stop smoking and currently using nicotine patches but is having cravings.  Rodney [name] returns today for follow-up. Overall he seems to be doing fairly well. He exercises regularly and has gotten his weight down to his lowest wait which he has not seen a number of years. He reports very infrequent episodes of A. fib, maybe 2 or 3 over the past year which represents good control on Tikosyn. 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 This has not been assessed in the last year. He occasionally gets some bleeding of his gums which of encouraged him to follow-up with his dentist. He needs to stay on aspirin on a daily basis because of the coronary benefit. Recently he saw Roderic Palau, NP for ongoing follow-up of Tikosyn. His potassium was 3.9 and she encouraged him to take potassium supplements to try to get it above 4.0. Were requested a recheck  his potassium today.  04/24/2017  Rodney [name] returns today for hospital follow-up. He was hospitalized in April with recurrent A. fib. He was weaned off Tikosyn and started on amiodarone. He was seen by Dr. [name] for a-fib ablation. He reports having had a rash on 400 mg amiodarone but that resolved with decreasing the dose to 200 mg daily. He scheduled to see Dr. [name] tomorrow and have CT angiography with a planned A. fib ablation on Friday.  10/17/2017  Mr. Minshall returns today for follow-up.  He underwent A. fib ablation with Dr. [name] and had been on amiodarone.  That was discontinued this past summer and he reports generally very few episodes of palpitations.  He also has a history of mixed ischemic and nonischemic cardiomyopathy with EF as low as 35% the recently came up to 45-50% as of May 2017.  In general he feels well, denying any chest pain or worsening shortness of breath.  He continues to play golf and can play 18 holes without stopping.  Review of his medications indicate that he is no longer on a beta-blocker, which was likely stopped when he went on to amiodarone.   07/30/2018  Mr. Sabel was seen today in follow-up.  He was recently seen by myself in the hospital in consultation for chest pain.  He ruled out for MI.  He was not in A. fib but related that he had had some recurrent A. fib recently.  He has successfully undergone A. fib ablation with Dr. [name].  He had been taken off of amiodarone but is  maintained on Eliquis.  I recommended outpatient nuclear stress testing.  He did undergo that nuclear stress test yesterday which indicated a small fixed inferior defect, possibly scar or attenuation artifact.  LVEF however was reduced to 39%.  His most recent echo in November showed an EF of 50 to 55%.  He reports he is made a change in decreasing his alcohol intake and back to off on both nicotine and marijuana use.  01/11/2021  Mr. Peron returns today for follow-up.  I last saw him via  virtual visit last year.  Has been followed primarily by EP because of A. fib ablation.  He has done well on that has been off of antiarrhythmic medications, only using a beta-blocker and as needed calcium channel blocker.  In general he says his palpitations have been well controlled although the other day he reportedly took some extra diltiazem as his heart rate increased over 100.  Blood pressure recently has been labile as well and reportedly was 170/100.  He was added to my schedule today for 2 days of chest pain.  He describes it as a shooting sensation across his chest, however although atypical for angina was similar to his symptoms prior to his last stent in 2016.  He had Myoview stress testing last in 2019 which was negative for ischemia and his LVEF has recovered.  Blood pressure in the office today however was better at 112/70.  EKG personally reviewed shows anterolateral T wave inversions which are unchanged from prior EKGs.  03/08/2021  Mr. Stadler is seen today in follow-up of chest pain.  He underwent nuclear stress testing which showed a small fixed inferior defect with EF 48%.  This is similar to findings in 2019 by Myoview stress testing.  EF was normal by echo in 2019.  He reports his discomfort has improved however he does get occasional breakthroughs of A. fib.  His PCP increased his carvedilol up to 12.5 mg twice daily about a week ago.  He has done well with this.  He is interested in using injectable medication for erectile dysfunction.  I am okay with this.  This was prescribed by his PCP.  He will need prescriptions for the [place].  PMHx:  Past Medical History:  Diagnosis Date  . AAA (abdominal aortic aneurysm) ([place])    stent graft put in 02/2009  . Arthritis   . CAD (coronary artery disease)    a. cath 2010 b. CAD s/p PCI to ramus and LCX on 03/04/15  . Gout   . Hyperlipidemia   . Hypertension   . OSA (obstructive sleep apnea) 09/30/2015   Very mild with AHI 6.2/hr  . Paroxysmal  atrial fibrillation ([facility])   . Tobacco abuse     Past Surgical History:  Procedure Laterality Date  . ABDOMINAL AORTIC ANEURYSM REPAIR  02/19/2009   performed by [facility]  . ATRIAL FIBRILLATION ABLATION N/A 04/28/2017   Procedure: Atrial Fibrillation Ablation;  Surgeon: Constance Haw, MD;  Location: [facility];  Service: Cardiovascular;  Laterality: N/A;  . COLONOSCOPY    . KNEE ARTHROSCOPY  2003   left  . LEFT AND RIGHT HEART CATHETERIZATION WITH CORONARY ANGIOGRAM N/A 03/03/2015   Procedure: LEFT AND RIGHT HEART CATHETERIZATION WITH CORONARY ANGIOGRAM;  Surgeon: Troy Sine, MD;    . PERCUTANEOUS CORONARY STENT INTERVENTION (PCI-S) N/A 03/04/2015   Procedure: PERCUTANEOUS CORONARY STENT INTERVENTION (PCI-S);  Surgeon: Troy Sine, MD; RI 99>>0% w/   BMS, bifurcation CFX-OM 90>>40% w/  Angiosculpt PTCA; dCFX 90>>0% w/   2.7520 mm Rebel BMS  . SHOULDER ARTHROSCOPY WITH ROTATOR CUFF REPAIR AND SUBACROMIAL DECOMPRESSION Left 11/19/2013   Procedure: LEFT SHOULDER ARTHROSCOPY DEBRIDEMENT EXTENTSIVE DISTAL CLAVICULECTOMY DECOMPRESSION PARTIAL ACROMIOPLASTY WITH CORACOACROMIAL WITH ROTATOR CUFF REPAIR ;  Surgeon: Renette Butters, MD;  Location: [facility];  Service: Orthopedics;  Laterality: Left;    FAMHx:  Family History  Problem Relation Age of Onset  . Prostate cancer Father   . Hypertension Mother   . Diabetes Sister   . Hypertension Sister   . Stroke Neg Hx   . Heart attack Neg Hx     SOCHx:   reports that he quit smoking about 5 years ago. His smoking use included cigarettes. He has a 25.00 pack-year smoking history. He has never used smokeless tobacco. He reports current alcohol use of about 7.0 - 8.0 standard drinks of alcohol per week. He reports that he does not use drugs.  ALLERGIES:  Allergies  Allergen Reactions  . Lisinopril Cough  . Iohexol Itching, Rash and Other (See Comments)     Code: RASH, Desc: PATIENT STATED THAT HE BEGAN ITCHING  TOWARDS END OF INJECTION OF IV CONTRAST-- 13 HR PREP RECOMMENDED/MMS     ROS: Pertinent items noted in HPI and remainder of comprehensive ROS otherwise negative.  HOME MEDS: Current Outpatient Medications  Medication Sig Dispense Refill  . amLODipine (NORVASC) 5 MG tablet Take 1 tablet (5 mg total) daily by mouth. 90 tablet 3  . apixaban (ELIQUIS) 5 MG TABS tablet Take 1 tablet (5 mg total) by mouth 2 (two) times daily. 180 tablet 3  . aspirin EC 81 MG tablet Take 81 mg by mouth daily.    Marland Kitchen atorvastatin (LIPITOR) 80 MG tablet Take 1 tablet (80 mg total) by mouth daily. 90 tablet 3  . carvedilol (COREG) 6.25 MG tablet Take 1.5 tablets (9.375 mg total) by mouth 2 (two) times daily with a meal. 90 tablet 3  . diltiazem (CARDIZEM) 30 MG tablet Take 1 tablet (30 mg total) by mouth 4 (four) times daily as needed (irregular heart rate >100 and BP>100). 45 tablet 2  . famotidine (PEPCID) 40 MG tablet Take 1 tablet (40 mg total) by mouth 2 (two) times daily. 180 tablet 2  . fish oil-omega-3 fatty acids 1000 MG capsule Take 1,000 mg by mouth daily.     . furosemide (LASIX) 20 MG tablet Take 20 mg by mouth daily.    . irbesartan (AVAPRO) 300 MG tablet TAKE 1/2 TABLET(150 MG) BY MOUTH DAILY 90 tablet 3  . isosorbide mononitrate (IMDUR) 60 MG 24 hr tablet TAKE 1 TABLET BY MOUTH EVERY DAY ( CAN NOT BE FILL 06.04.2018) 90 tablet 2  . KLOR-CON M10 10 MEQ tablet TAKE 1 TABLET BY MOUTH TWICE A DAY 180 tablet 3  . loratadine (CLARITIN) 10 MG tablet Take 10 mg by mouth daily as needed (seasonal allergies).     . Multiple Vitamin (MULTIVITAMIN WITH MINERALS) TABS tablet Take 1 tablet by mouth daily.    . nitroGLYCERIN (NITROSTAT) 0.4 MG SL tablet Place 1 tablet (0.4 mg total) under the tongue every 5 (five) minutes as needed for chest pain (shortness of breath). 25 tablet 3   No current facility-administered medications for this visit.    LABS/IMAGING: No results found for this or any previous visit (from  the past 48 hour(s)). No results found.  VITALS: BP 130/68 (BP Location: Left Arm, Patient Position: Sitting, Cuff Size: Normal)  Pulse (!) 58   Ht 6\' 2"  (1.88 m)   Wt 217 lb (98.4 kg)   SpO2 94%   BMI 27.86 kg/m   EXAM: General appearance: alert and no distress Neck: no carotid bruit, no JVD and thyroid not enlarged, symmetric, no tenderness/mass/nodules Lungs: clear to auscultation bilaterally Heart: regular rate and rhythm Abdomen: soft, non-tender; bowel sounds normal; no masses,  no organomegaly Extremities: extremities normal, atraumatic, no cyanosis or edema Pulses: 2+ and symmetric Skin: Skin color, texture, turgor normal. No rashes or lesions Neurologic: Grossly normal Psych: Pleasant  EKG: Deferred  ASSESSMENT: 1. Recent chest pain - low Myoview stress test, fixed inferior defect - LVEF 48% (2022) 2. CAD s/p PCI to the ramus intermedius and distal circumflex arteries (02/2015) 3. Ischemic cardiomyopathy EF 35-40%  - improved to 50-55% on 10/2017 4. History of systolic and diastolic congestive heart failure 5. Accelerated hypertension 6. History of abdominal aortic aneurysm repair 7. Paroxysmal atrial fibrillation - CHADSVASC 4, s/p ablation (2017)  PLAN: 1.   Mr. Iden says his chest pain is improved somewhat.  He seems to be having very infrequent breakthroughs of A. fib.  His PCP increased his carvedilol which I agree with and will provide a new prescription for 12.5 mg tablets 1 twice daily.  Okay to use injectable medication for erectile dysfunction.  Plan follow-up with me annually or sooner as necessary.  He inquired about coming off of aspirin however given his coronary disease, I think he should remain on low-dose aspirin despite additionally taking Eliquis as there is little evidence for cardiovascular risk reduction regarding the Eliquis for coronary disease, however, the combination with low dose aspirin was well-tolerated (AUGUTUS trial).  [name],  MD, [facility], [place] Director of the Advanced Lipid Disorders &  Cardiovascular Risk Reduction Clinic Diplomate of the American Board of Clinical Lipidology Attending Cardiologist  Direct Dial: [phone]  Fax: [phone]  Website:  [email or web address] 03/08/2021, 8:30 AM

## 2021-09-09 ENCOUNTER — Ambulatory Visit
Admission: RE | Admit: 2021-09-09 | Discharge: 2021-09-09 | Disposition: A | Discharge status: Home / Self Care | Payer: Medicare Other | Source: Ambulatory Visit | Attending: Internal Medicine | Admitting: Internal Medicine

## 2021-09-09 ENCOUNTER — Other Ambulatory Visit: Payer: Self-pay | Admitting: Internal Medicine

## 2021-09-09 DIAGNOSIS — R059 Cough, unspecified: Secondary | ICD-10-CM

## 2021-11-14 ENCOUNTER — Other Ambulatory Visit: Payer: Self-pay

## 2021-11-14 ENCOUNTER — Encounter (HOSPITAL_COMMUNITY): Payer: Self-pay | Admitting: Emergency Medicine

## 2021-11-14 ENCOUNTER — Observation Stay (HOSPITAL_COMMUNITY)
Admission: EM | Admit: 2021-11-14 | Discharge: 2021-11-15 | Disposition: A | Discharge status: Home / Self Care | Payer: No Typology Code available for payment source | Attending: Emergency Medicine | Admitting: Emergency Medicine

## 2021-11-14 ENCOUNTER — Emergency Department (HOSPITAL_COMMUNITY): Payer: No Typology Code available for payment source

## 2021-11-14 DIAGNOSIS — Z79899 Other long term (current) drug therapy: Secondary | ICD-10-CM | POA: Insufficient documentation

## 2021-11-14 DIAGNOSIS — Z20822 Contact with and (suspected) exposure to covid-19: Secondary | ICD-10-CM | POA: Diagnosis not present

## 2021-11-14 DIAGNOSIS — I10 Essential (primary) hypertension: Secondary | ICD-10-CM | POA: Diagnosis present

## 2021-11-14 DIAGNOSIS — I5031 Acute diastolic (congestive) heart failure: Secondary | ICD-10-CM | POA: Diagnosis not present

## 2021-11-14 DIAGNOSIS — I2510 Atherosclerotic heart disease of native coronary artery without angina pectoris: Secondary | ICD-10-CM | POA: Diagnosis present

## 2021-11-14 DIAGNOSIS — Z87891 Personal history of nicotine dependence: Secondary | ICD-10-CM | POA: Insufficient documentation

## 2021-11-14 DIAGNOSIS — R778 Other specified abnormalities of plasma proteins: Secondary | ICD-10-CM

## 2021-11-14 DIAGNOSIS — Z7982 Long term (current) use of aspirin: Secondary | ICD-10-CM | POA: Insufficient documentation

## 2021-11-14 DIAGNOSIS — R002 Palpitations: Secondary | ICD-10-CM | POA: Diagnosis present

## 2021-11-14 DIAGNOSIS — R0602 Shortness of breath: Secondary | ICD-10-CM | POA: Insufficient documentation

## 2021-11-14 DIAGNOSIS — I110 Hypertensive heart disease with heart failure: Secondary | ICD-10-CM | POA: Diagnosis not present

## 2021-11-14 DIAGNOSIS — I480 Paroxysmal atrial fibrillation: Secondary | ICD-10-CM | POA: Diagnosis present

## 2021-11-14 DIAGNOSIS — I2583 Coronary atherosclerosis due to lipid rich plaque: Secondary | ICD-10-CM | POA: Diagnosis present

## 2021-11-14 DIAGNOSIS — Z8673 Personal history of transient ischemic attack (TIA), and cerebral infarction without residual deficits: Secondary | ICD-10-CM | POA: Insufficient documentation

## 2021-11-14 DIAGNOSIS — I255 Ischemic cardiomyopathy: Secondary | ICD-10-CM | POA: Diagnosis present

## 2021-11-14 LAB — CBC WITH DIFFERENTIAL/PLATELET
Abs Immature Granulocytes: 0 10*3/uL (ref 0.00–0.07)
Basophils Absolute: 0 10*3/uL (ref 0.0–0.1)
Basophils Relative: 1 %
Eosinophils Absolute: 0.1 10*3/uL (ref 0.0–0.5)
Eosinophils Relative: 2 %
HCT: 49 % (ref 39.0–52.0)
Hemoglobin: 16.9 g/dL (ref 13.0–17.0)
Immature Granulocytes: 0 %
Lymphocytes Relative: 37 %
Lymphs Abs: 1.6 10*3/uL (ref 0.7–4.0)
MCH: 33.1 pg (ref 26.0–34.0)
MCHC: 34.5 g/dL (ref 30.0–36.0)
MCV: 96.1 fL (ref 80.0–100.0)
Monocytes Absolute: 0.4 10*3/uL (ref 0.1–1.0)
Monocytes Relative: 9 %
Neutro Abs: 2.2 10*3/uL (ref 1.7–7.7)
Neutrophils Relative %: 51 %
Platelets: 127 10*3/uL — ABNORMAL LOW (ref 150–400)
RBC: 5.1 MIL/uL (ref 4.22–5.81)
RDW: 13.2 % (ref 11.5–15.5)
WBC: 4.2 10*3/uL (ref 4.0–10.5)
nRBC: 0 % (ref 0.0–0.2)

## 2021-11-14 LAB — BASIC METABOLIC PANEL
Anion gap: 8 (ref 5–15)
BUN: 15 mg/dL (ref 8–23)
CO2: 20 mmol/L — ABNORMAL LOW (ref 22–32)
Calcium: 9 mg/dL (ref 8.9–10.3)
Chloride: 111 mmol/L (ref 98–111)
Creatinine, Ser: 1.11 mg/dL (ref 0.61–1.24)
GFR, Estimated: >60 mL/min (ref >60)
Glucose, Bld: 126 mg/dL — ABNORMAL HIGH (ref 70–99)
Potassium: 3.8 mmol/L (ref 3.5–5.1)
Sodium: 139 mmol/L (ref 135–145)

## 2021-11-14 LAB — TROPONIN I (HIGH SENSITIVITY): Troponin I (High Sensitivity): 7 ng/L (ref <18)

## 2021-11-14 LAB — MAGNESIUM: Magnesium: 2.3 mg/dL (ref 1.7–2.4)

## 2021-11-14 NOTE — ED Triage Notes (Signed)
PT c/o increased BP and HR x "a few days." Reports SOB. Hx afib.

## 2021-11-14 NOTE — ED Provider Notes (Signed)
Emergency Medicine Provider Triage Evaluation Note  Rodney [name] , a 80 y.o. male  was evaluated in triage.  Pt complains of intermittent palpitations over the last few days.  He is chronically anticoagulated has not missed any doses.  He feels short of breath.  No cough, lower extremity swelling.  Does like blood pressure has been elevated as well.  Took 1 dose of Cardizem without relief.  Has some tightness in his chest or denies any overt pain  Review of Systems  Positive: Palpitation, shortness of breath Negative: Fever, emesis, back pain  Physical Exam  There were no vitals taken for this visit. Gen:   Awake, no distress   Resp:  Normal effort  MSK:   Moves extremities without difficulty  Other:    Medical Decision Making  Medically screening exam initiated at 7:19 PM.  Appropriate orders placed.  [name] was informed that the remainder of the evaluation will be completed by another provider, this initial triage assessment does not replace that evaluation, and the importance of remaining in the ED until their evaluation is complete.  Palpitations, shortness of breath   Jahniya Duzan A, PA-C 11/14/21 1919    Varney Biles, MD 11/15/21 1830

## 2021-11-15 ENCOUNTER — Telehealth: Payer: Self-pay | Admitting: Student in an Organized Health Care Education/Training Program

## 2021-11-15 ENCOUNTER — Encounter (HOSPITAL_COMMUNITY): Payer: Self-pay | Admitting: Family Medicine

## 2021-11-15 DIAGNOSIS — I2510 Atherosclerotic heart disease of native coronary artery without angina pectoris: Secondary | ICD-10-CM

## 2021-11-15 DIAGNOSIS — R002 Palpitations: Secondary | ICD-10-CM

## 2021-11-15 DIAGNOSIS — I2583 Coronary atherosclerosis due to lipid rich plaque: Secondary | ICD-10-CM

## 2021-11-15 DIAGNOSIS — I471 Supraventricular tachycardia: Secondary | ICD-10-CM | POA: Diagnosis not present

## 2021-11-15 DIAGNOSIS — I248 Other forms of acute ischemic heart disease: Secondary | ICD-10-CM

## 2021-11-15 DIAGNOSIS — I480 Paroxysmal atrial fibrillation: Secondary | ICD-10-CM

## 2021-11-15 DIAGNOSIS — I10 Essential (primary) hypertension: Secondary | ICD-10-CM

## 2021-11-15 DIAGNOSIS — I255 Ischemic cardiomyopathy: Secondary | ICD-10-CM | POA: Diagnosis not present

## 2021-11-15 DIAGNOSIS — R778 Other specified abnormalities of plasma proteins: Secondary | ICD-10-CM

## 2021-11-15 LAB — HEMOGLOBIN A1C
Hgb A1c MFr Bld: 5.4 % (ref 4.8–5.6)
Mean Plasma Glucose: 108.28 mg/dL

## 2021-11-15 LAB — TROPONIN I (HIGH SENSITIVITY)
Troponin I (High Sensitivity): 52 ng/L — ABNORMAL HIGH (ref <18)
Troponin I (High Sensitivity): 9 ng/L (ref <18)
Troponin I (High Sensitivity): 8 ng/L (ref <18)

## 2021-11-15 LAB — RESP PANEL BY RT-PCR (FLU A&B, COVID) ARPGX2
Influenza A by PCR: NEGATIVE
Influenza B by PCR: NEGATIVE
SARS Coronavirus 2 by RT PCR: NEGATIVE

## 2021-11-15 MED ORDER — CARVEDILOL 12.5 MG PO TABS: 12.5000 mg | ORAL_TABLET | Freq: Two times a day (BID) | ORAL | Status: DC

## 2021-11-15 MED ORDER — ASPIRIN EC 81 MG PO TBEC
81.0000 mg | DELAYED_RELEASE_TABLET | Freq: Every day | ORAL | Status: DC
  Administered 2021-11-15: 81 mg via ORAL
  Filled 2021-11-15: qty 1

## 2021-11-15 MED ORDER — HEPARIN SODIUM (PORCINE) 5000 UNIT/ML IJ SOLN: 5000.0000 [IU] | Freq: Three times a day (TID) | INTRAMUSCULAR | Status: DC

## 2021-11-15 MED ORDER — PANTOPRAZOLE SODIUM 40 MG PO TBEC
40.0000 mg | DELAYED_RELEASE_TABLET | Freq: Every day | ORAL | Status: DC
  Filled 2021-11-15: qty 1

## 2021-11-15 MED ORDER — POTASSIUM CHLORIDE CRYS ER 10 MEQ PO TBCR
10.0000 meq | EXTENDED_RELEASE_TABLET | Freq: Two times a day (BID) | ORAL | Status: DC
  Administered 2021-11-15: 10 meq via ORAL
  Filled 2021-11-15: qty 1

## 2021-11-15 MED ORDER — ACETAMINOPHEN 325 MG PO TABS: 650.0000 mg | ORAL_TABLET | ORAL | Status: DC | PRN

## 2021-11-15 MED ORDER — AMLODIPINE BESYLATE 5 MG PO TABS
5.0000 mg | ORAL_TABLET | Freq: Every day | ORAL | Status: DC
  Administered 2021-11-15: 5 mg via ORAL
  Filled 2021-11-15: qty 1

## 2021-11-15 MED ORDER — ONDANSETRON HCL 4 MG/2ML IJ SOLN: 4.0000 mg | Freq: Four times a day (QID) | INTRAMUSCULAR | Status: DC | PRN

## 2021-11-15 MED ORDER — ISOSORBIDE MONONITRATE ER 60 MG PO TB24
60.0000 mg | ORAL_TABLET | Freq: Every day | ORAL | Status: DC
  Administered 2021-11-15: 60 mg via ORAL
  Filled 2021-11-15: qty 1

## 2021-11-15 MED ORDER — FUROSEMIDE 40 MG PO TABS
20.0000 mg | ORAL_TABLET | Freq: Every day | ORAL | Status: DC
  Administered 2021-11-15: 20 mg via ORAL
  Filled 2021-11-15: qty 1

## 2021-11-15 MED ORDER — IRBESARTAN 150 MG PO TABS
150.0000 mg | ORAL_TABLET | Freq: Every day | ORAL | Status: DC
  Administered 2021-11-15: 150 mg via ORAL
  Filled 2021-11-15: qty 1

## 2021-11-15 MED ORDER — ATORVASTATIN CALCIUM 40 MG PO TABS: 80.0000 mg | ORAL_TABLET | Freq: Every day | ORAL | Status: DC

## 2021-11-15 MED ORDER — APIXABAN 5 MG PO TABS: 5.0000 mg | ORAL_TABLET | Freq: Two times a day (BID) | ORAL | Status: DC

## 2021-11-15 MED ORDER — APIXABAN 5 MG PO TABS
5.0000 mg | ORAL_TABLET | Freq: Two times a day (BID) | ORAL | Status: DC
  Administered 2021-11-15: 5 mg via ORAL
  Filled 2021-11-15: qty 1

## 2021-11-15 NOTE — Consult Note (Signed)
Cardiology Consultation:   Patient ID: Rodney [name] MRN: [id number]; DOB: December 21, 1940  Admit date: 11/14/2021 Date of Consult: 11/15/2021  PCP:  [name], MD   [place] Providers Cardiologist:  [name], MD  Electrophysiologist:  Constance Haw, MD       Patient Profile:   Rodney [name] is a 80 y.o. male with a hx of DES RI/CFX 2016, HTN, HLD, AAA s/p stent 2010, PAF s/p ablation 2018 on Eliquis, OSA, gout, who is being seen 11/15/2021 for the evaluation of tachycardia and elevated troponin, at the request of Dr Karleen Hampshire.  History of Present Illness:   Rodney [name] was last seen by Dr Debara Pickett 02/2021, recent stress test abnl but stable, occ bouts of palpitations thought to be afib, BB increased. Wt 217 lbs.  This is a complex 80 year old male patient of mine who is well-known with a longstanding history of coronary disease, atrial fibrillation status post radiofrequency ablation and prior management on dofetilide.  He had also previously been on amiodarone but had rash associated with that.  Overall his prior ablation was felt to be fairly successful however he now presents with tachycardia.  He noticed over the past several days palpitations and tachycardia including last evening at which time his heart rate was racing in the 130s.  He took an extra dose of diltiazem which did not slow it much and after about 2 hours decided present to the emergency department.  He denied any chest pain with this.  Initial troponin was negative at 7 however subsequent troponin increased to 52 then 9 then 8 (both negative).  Throughout this he denied any chest pain but was having symptomatic palpitations.  Review of telemetry shows that he converted from what appeared to be a junctional tachycardia to a sinus bradycardia early in the morning and has been persistently in sinus bradycardia in the low 50s since then.   Past Medical History:  Diagnosis Date   AAA (abdominal aortic aneurysm)     stent graft put in 02/2009   Arthritis    CAD (coronary artery disease)    a. cath 2010 b. CAD s/p PCI to ramus and LCX on 03/04/15   Gout    Hyperlipidemia    Hypertension    OSA (obstructive sleep apnea) 09/30/2015   Very mild with AHI 6.2/hr   Paroxysmal atrial fibrillation ([place])    Tobacco abuse     Past Surgical History:  Procedure Laterality Date   ABDOMINAL AORTIC ANEURYSM REPAIR  02/19/2009   performed by [facility] N/A 04/28/2017   Procedure: Atrial Fibrillation Ablation;  Surgeon: Constance Haw, MD;  Location: [facility];  Service: Cardiovascular;  Laterality: N/A;   COLONOSCOPY     KNEE ARTHROSCOPY  2003   left   LEFT AND RIGHT HEART CATHETERIZATION WITH CORONARY ANGIOGRAM N/A 03/03/2015   Procedure: LEFT AND RIGHT HEART CATHETERIZATION WITH CORONARY ANGIOGRAM;  Surgeon: Troy Sine, MD;     PERCUTANEOUS CORONARY STENT INTERVENTION (PCI-S) N/A 03/04/2015   Procedure: PERCUTANEOUS CORONARY STENT INTERVENTION (PCI-S);  Surgeon: Troy Sine, MD; RI 99>>0% w/   BMS, bifurcation CFX-OM 90>>40% w/ Angiosculpt PTCA; dCFX 90>>0% w/   2.7520 mm Rebel BMS   SHOULDER ARTHROSCOPY WITH ROTATOR CUFF REPAIR AND SUBACROMIAL DECOMPRESSION Left 11/19/2013   Procedure: LEFT SHOULDER ARTHROSCOPY DEBRIDEMENT EXTENTSIVE DISTAL CLAVICULECTOMY DECOMPRESSION PARTIAL ACROMIOPLASTY WITH CORACOACROMIAL WITH ROTATOR CUFF REPAIR ;  Surgeon: Renette Butters, MD;  Location: [facility];  Service: Orthopedics;  Laterality: Left;     Home Medications:  Prior to Admission medications   Medication Sig Start Date End Date Taking? Authorizing Provider  amLODipine (NORVASC) 5 MG tablet Take 1 tablet (5 mg total) daily by mouth. 10/17/17  Yes Judieth Mckown, Nadean Corwin, MD  apixaban (ELIQUIS) 5 MG TABS tablet Take 1 tablet (5 mg total) by mouth 2 (two) times daily. 05/26/15  Yes [name], MD  aspirin EC 81 MG tablet Take 81 mg by mouth daily.   Yes [provider]  atorvastatin (LIPITOR) 80 MG tablet Take 1 tablet (80 mg total) by mouth daily. 05/21/15  Yes Icey Tello, Nadean Corwin, MD  carvedilol (COREG) 12.5 MG tablet Take 1 tablet (12.5 mg total) by mouth 2 (two) times daily with a meal. 03/08/21  Yes Angelis Gates, Nadean Corwin, MD  diltiazem (CARDIZEM) 30 MG tablet Take 1 tablet (30 mg total) by mouth 4 (four) times daily as needed (irregular heart rate >100 and BP>100). 01/11/21  Yes Sarajane Fambrough, Nadean Corwin, MD  famotidine (PEPCID) 40 MG tablet Take 1 tablet (40 mg total) by mouth 2 (two) times daily. 05/01/19  Yes Glenola Wheat, Nadean Corwin, MD  fish oil-omega-3 fatty acids 1000 MG capsule Take 1,000 mg by mouth daily.    Yes [provider]  fluticasone (FLONASE) 50 MCG/ACT nasal spray Place 1 spray into both nostrils daily as needed for allergies. 09/21/21  Yes [provider]  furosemide (LASIX) 20 MG tablet Take 20 mg by mouth daily.   Yes [provider]  irbesartan (AVAPRO) 300 MG tablet TAKE 1/2 TABLET(150 MG) BY MOUTH DAILY Patient taking differently: Take 150 mg by mouth daily. 02/24/20  Yes Shirley Friar, PA-C  isosorbide mononitrate (IMDUR) 60 MG 24 hr tablet TAKE 1 TABLET BY MOUTH EVERY DAY ( CAN NOT BE FILL 06.04.2018) Patient taking differently: 60 mg daily. 03/14/18  Yes Ugochi Henzler, Nadean Corwin, MD  KLOR-CON M10 10 MEQ tablet TAKE 1 TABLET BY MOUTH TWICE A DAY Patient taking differently: 10 mEq 2 (two) times daily. 11/19/18  Yes Kadelyn Dimascio, Nadean Corwin, MD  loratadine (CLARITIN) 10 MG tablet Take 10 mg by mouth daily as needed (seasonal allergies).    Yes [provider]  Multiple Vitamin (MULTIVITAMIN WITH MINERALS) TABS tablet Take 1 tablet by mouth daily.   Yes [provider]  nitroGLYCERIN (NITROSTAT) 0.4 MG SL tablet Place 1 tablet (0.4 mg total) under the tongue every 5 (five) minutes as needed for chest pain (shortness of breath). 01/11/21  Yes Seeley Southgate, Nadean Corwin, MD    Inpatient Medications: Scheduled Meds:   amLODipine  5 mg Oral Daily   apixaban  5 mg Oral BID   aspirin EC  81 mg Oral Daily   atorvastatin  80 mg Oral QHS   carvedilol  12.5 mg Oral BID WC   furosemide  20 mg Oral Daily   irbesartan  150 mg Oral Daily   isosorbide mononitrate  60 mg Oral Daily   pantoprazole  40 mg Oral Daily   potassium chloride  10 mEq Oral BID   Continuous Infusions:  PRN Meds: acetaminophen, ondansetron (ZOFRAN) IV  Allergies:    Allergies  Allergen Reactions   Lisinopril Cough   Iohexol Itching, Rash and Other (See Comments)     Code: RASH, Desc: PATIENT STATED THAT HE BEGAN ITCHING TOWARDS END OF INJECTION OF IV CONTRAST-- 13 HR PREP RECOMMENDED/MMS     Social History:   Social History  Socioeconomic History   Marital status: Married    Spouse name: Not on file   Number of children: Not on file   Years of education: Not on file   Highest education level: Not on file  Occupational History   Not on file  Tobacco Use   Smoking status: Former    Packs/day: 0.50    Years: 50.00    Pack years: 25.00    Types: Cigarettes    Quit date: 03/01/2016    Years since quitting: 5.7   Smokeless tobacco: Never  Vaping Use   Vaping Use: Every day  Substance and Sexual Activity   Alcohol use: Yes    Alcohol/week: 7.0 - 8.0 standard drinks    Types: 6 Cans of beer, 1 - 2 Shots of liquor per week    Comment: couple shots of royal per day   Drug use: No   Sexual activity: Not Currently    Comment: cutting down 1/2 ppd  Other Topics Concern   Not on file  Social History Narrative   Lives with wife, does not use assist, drives.  No home health services.     Social Determinants of Health   Financial Resource Strain: Not on file  Food Insecurity: Not on file  Transportation Needs: Not on file  Physical Activity: Not on file  Stress: Not on file  Social Connections: Not on file  Intimate Partner Violence: Not on file    Family History:   Family History  Problem Relation Age of Onset    Prostate cancer Father    Hypertension Mother    Diabetes Sister    Hypertension Sister    Stroke Neg Hx    Heart attack Neg Hx      ROS:  Please see the history of present illness.  All other ROS reviewed and negative.     Physical Exam/Data:   Vitals:   11/15/21 0600 11/15/21 0630 11/15/21 0700 11/15/21 0800  BP: 138/77 118/69 125/69 132/65  Pulse: (!) 45 (!) 58 (!) 51 (!) 50  Resp: 14 20 18 17   Temp:      TempSrc:      SpO2: 95% 94% 96% 96%   No intake or output data in the 24 hours ending 11/15/21 0827 Last 3 Weights 03/08/2021 01/14/2021 01/11/2021  Weight (lbs) 217 lb 213 lb 213 lb  Weight (kg) 98.431 kg 96.616 kg 96.616 kg     General appearance: alert and no distress Neck: no carotid bruit, no JVD, and thyroid not enlarged, symmetric, no tenderness/mass/nodules Lungs: clear to auscultation bilaterally Heart: regular rate and rhythm, S1, S2 normal, no murmur, click, rub or gallop Abdomen: soft, non-tender; bowel sounds normal; no masses,  no organomegaly Extremities: extremities normal, atraumatic, no cyanosis or edema Pulses: 2+ and symmetric Skin: Skin color, texture, turgor normal. No rashes or lesions Neurologic: Grossly normal Psych: Pleasant   EKG:  The EKG was personally reviewed and demonstrates:   12/03 ECG is junctional tach, HR 100 12/04 ECG is sinus brady, HR 56, RBBB, LAFB  Telemetry:  Telemetry was personally reviewed and demonstrates: Junctional tachycardia in the 130 range, then converted to a sinus bradycardia with occasional PVCs  Relevant CV Studies:   MYOVIEW: 01/14/2021 Nuclear stress EF: 48%. The left ventricular ejection fraction is mildly decreased (45-54%). There was no ST segment deviation noted during stress. Baseline T wave abnormality. Defect 1: There is a medium defect of mild severity present in the basal inferoseptal, basal inferior, mid inferior  and apical inferior location. Findings consistent with prior myocardial  infarction. This is an intermediate risk study.   Unchanged inferior infarct of mild severity as compared to prior exam. No new ischemia.  Intermediate risk due to reduced EF and infarct.   ECHO:08/07/2018 - Left ventricle: Systolic function was normal. The estimated    ejection fraction was in the range of 55% to 60%. Wall motion was    normal; there were no regional wall motion abnormalities. Doppler    parameters are consistent with abnormal left ventricular    relaxation (grade 1 diastolic dysfunction).   Impressions:   - Left ventricular systolic function has further improved compared    to the previous studies.   CARDIAC CATH: 03/04/2015 ANGIOGRAPHY:   At the start of the procedure, left main was a large caliber vessel which trifurcated into the LAD, intermediate-like vessel and the left circumflex coronary artery.  The ramus intermediate vessel had 60% ostial stenosis in some views eccentrically and then had a 99-100% subtotal stenosis proximally followed by another area of 95% stenosis.  There was very poor visualization beyond this.  Following successful intervention to the ramus vessel with PTCA, and ultimate stenting from the ostium to the mid vessel the entire stented region was reduced to 0%.  The ramus intermediate vessel is large caliber and extended to the apex and gave rise to several additional distal branches.  There was 30% narrowing beyond the branch after the stented segment   The left circumflex vessel was a dominant vessel that had 30% mild proximal narrowing.  In the distal aspect.  There was a bifurcation stenosis of 80-90% involving a smaller distal marginal branch as well as the distal AV groove into the PDA/PLA system which was large caliber.  Following complex intervention with double wire technique, Angiosculpt scoring balloon of both the ostium of the distal circumflex marginal and the the dominant distal circumflex into the PLA system, the large circumflex PLA  80% stenosis ultimately treated with a 2.7520 mm bare-metal Rebel stent postdilated with taper from 2.9.  A 2.8 mm was reduced to 0%. There was brisk TIMI-3 flow.  The distal circumflex marginal branch, which was jailed by the stented region and had undergone Angiosculpt scoring balloon and long PTCA with the 90% stenosis being reduced to 30-40% did have ostial re-narrowing following being jailed by the stented segment.      IMPRESSION:   Difficult but successful complex intervention involving PTCA/BM stenting of a subtotal ramus intermediate vessel with the entire proximal region being reduced to 0% in an intermediate vessel which extended to the LV apex once opened; and Angiosculpt/PTCA of bifurcation stenosis involving the distal circumflex flex marginal branch and Angiosculpt/BM stenting of a dominant distal circumflex extending into the PD/PLA vessel.  CARDIAC CATH: 02/09/2009 ANGIOGRAPHY:  Left main:  The left main is fairly normal.    The left anterior descending artery has some proximal luminal  irregularities between 20 and 30%.  The mid vessel stenosis around 30- 40%.  It gives off several small diagonal arteries.  The first diagonal artery is moderate in size and has mild irregularities.  The second diagonal artery is very tiny and has an 80% proximal stenosis.  This vessel is quite small and is not a candidate for PTCA.    The remaining LAD has only minor luminal irregularities.    The left circumflex artery is a large vessel.  It gives off a very high obtuse marginal artery.  The remaining circumflex  artery continues around the A-V groove and supplies a posterolateral branch.    The first obtuse marginal artery is subtotally occluded at 2 different sites.  There was sluggish TIMI grade 1 flow through this vessel.  This vessel reaches around the lateral wall and supplies a small amount of the inferior left ventricle.  This was quite likely where the site of the abnormal Cardiolite.     The right coronary artery is large and dominant.  There are minor luminal irregularities.  The posterior descending artery and the posterolateral segment artery, they taper fairly quickly at the takeoff. This taper improved slightly with intracoronary nitroglycerin.  There are only minor luminal irregularities between 20 and 30%.    The left ventriculogram was performed in a 30-RAO position.  It reveals a moderately dilated and hypocontractile left ventricle.  The ejection fraction is probably 40%.    During this left ventriculogram, we noticed that he had a very dilated aortic root.  We performed an aortic root angiography, which revealed a dilated aortic root.  There was no evidence of aortic insufficiency.  Laboratory Data:  High Sensitivity Troponin:   Recent Labs  Lab 11/14/21 1928 11/15/21 0042 11/15/21 0330 11/15/21 0637  TROPONINIHS 7 52* 9 8     Chemistry Recent Labs  Lab 11/14/21 1928  NA 139  K 3.8  CL 111  CO2 20*  GLUCOSE 126*  BUN 15  CREATININE 1.11  CALCIUM 9.0  MG 2.3  GFRNONAA >60  ANIONGAP 8    No results for input(s): PROT, ALBUMIN, AST, ALT, ALKPHOS, BILITOT in the last 168 hours. Lipids No results for input(s): CHOL, TRIG, HDL, LABVLDL, LDLCALC, CHOLHDL in the last 168 hours.  Hematology Recent Labs  Lab 11/14/21 1928  WBC 4.2  RBC 5.10  HGB 16.9  HCT 49.0  MCV 96.1  MCH 33.1  MCHC 34.5  RDW 13.2  PLT 127*   Thyroid No results for input(s): TSH, FREET4 in the last 168 hours.  BNPNo results for input(s): BNP, PROBNP in the last 168 hours.  DDimer No results for input(s): DDIMER in the last 168 hours.   Radiology/Studies:  DG Chest 2 View  Result Date: 11/14/2021 CLINICAL DATA:  Shortness of breath.  Hypertension.  Tachycardia. EXAM: CHEST - 2 VIEW COMPARISON:  09/09/2021 FINDINGS: Heart size is normal. Tortuous aorta. The lungs are clear. The vascularity is normal. No effusions. No significant bone finding. IMPRESSION: No active  cardiopulmonary disease. Electronically Signed   By: Nelson Chimes M.D.   On: 11/14/2021 20:15     Assessment and Plan:   Junctional tachycardia Mr. Hughlett presented with a junctional tachycardia which was sustained and not improved on diltiazem.  He is already on an increased dose of carvedilol.  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  He is status post A. fib ablation and really has not had a lot of recurrent atrial fibrillation.  I suspect this caused a demand ischemia.  Options are somewhat limited is previously been on dofetilide and amiodarone separately having had rash on amiodarone.  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  I would like him to be reevaluated by cardiac electrophysiology and he would like a different opinion.  Will likely refer him to Dr. Quentin Ore for further evaluation. Elevated troponin/demand ischemia Initial troponin was negative however subsequent was mildly positive.  The 2 following troponins were negative and he is denied any chest  pain.  He does have some complex coronary anatomy with prior intervention in 2016 and I suspect some degree of small vessel disease which caused demand ischemia in the setting of tachycardia.  He has had no chest pain or limitations to activity leading up to this episode and even during his tachycardia did not have chest pain.  Would not recommend further work-up at this time.  At this point I think he could be discharged home with close follow-up.  We will arrange for cardiac EP consultation with Dr. Quentin Ore to evaluate further management options such as repeat ablation or possible pacemaker.  I think he can continue to use the as needed diltiazem strategy.  From a coronary standpoint, he is already on aspirin, high-dose atorvastatin, carvedilol, irbesartan, amlodipine, Lasix and Imdur.  Continue current medications.  Follow-up with me in the  office.  For questions or updates, please contact [place] Please consult [email or web address] for contact info under   [name], MD, FACC, [place] Director of the [facility]  [facility] Diplomate of the American Board of Clinical Lipidology Attending Cardiologist  Direct Dial: [phone]  Fax: [phone]  Website:  [email or web address]

## 2021-11-15 NOTE — ED Provider Notes (Signed)
[facility] DEPT Provider Note   CSN: [id number] Arrival date & time: 11/14/21  1906     History Chief Complaint  Patient presents with   Palpitations   Shortness of Breath    Rodney [name] is a 80 y.o. male.  The history is provided by the patient and medical records.  Palpitations Associated symptoms: shortness of breath   Shortness of Breath Rodney [name] is a 80 y.o. male who presents to the Emergency Department complaining of palpitations.  For the last 3-4 days in the evening he has been experiencing palpitations for 3-4 hours at a time.  He takes his prn med and it helps his sxs.  No chest pain.  Has occasional sob with afib episodes, felt like he was in afib around 2pm.  He is taking diltiazem 1 tablet twice in last four days. Symptoms are moderate and intermittent.    Past Medical History:  Diagnosis Date   AAA (abdominal aortic aneurysm)    stent graft put in 02/2009   Arthritis    CAD (coronary artery disease)    a. cath 2010 b. CAD s/p PCI to ramus and LCX on 03/04/15   Gout    Hyperlipidemia    Hypertension    OSA (obstructive sleep apnea) 09/30/2015   Very mild with AHI 6.2/hr   Paroxysmal atrial fibrillation ([place])    Tobacco abuse     Patient Active Problem List   Diagnosis Date Noted   Chest pain 07/22/2018   Abnormal EKG 07/22/2018   Nonischemic cardiomyopathy ([place]) 10/17/2017   AF (atrial fibrillation) ([place]) 04/28/2017   Premature ventricular contractions (PVCs) (VPCs) 03/11/2017   H/O endovascular stent graft for abdominal aortic aneurysm 09/01/2016   Cardiomyopathy, ischemic 03/30/2016   OSA (obstructive sleep apnea) 09/30/2015   Encounter for monitoring anti-arrhythmic therapy 05/04/2015   DOE (dyspnea on exertion) 03/16/2015   Atrial fibrillation with RVR ([place]) 03/16/2015   Hypertensive urgency 03/08/2015   Coronary artery disease due to lipid rich plaque    AA (aortic aneurysm) ([place])    Dyspnea 03/02/2015    Progressive angina ([place]) 03/02/2015   History of TIA (transient ischemic attack), 02/20/15 03/02/2015   Mitral regurgitation 03/02/2015   HX: anticoagulation 03/02/2015   Chest tightness    Tobacco abuse    HLD (hyperlipidemia)    TIA (transient ischemic attack) 02/20/2015   Leukopenia 02/20/2015   Chronic combined systolic and diastolic heart failure, NYHA class 1 ([place]) 02/20/2015   PAF (paroxysmal atrial fibrillation) ([place]) 01/29/2015   Accelerated hypertension, hx of 29/51/8841   Acute diastolic CHF (congestive heart failure), NYHA class 2 ([place]) 01/03/2015   Demand ischemia ([place]) 01/03/2015   Coronary artery disease involving native coronary artery of native heart without angina pectoris 01/03/2015   Troponin level elevated    Thrombocytopenia ([facility])    Essential hypertension    S/P abdominal aortic aneurysm repair 07/03/2012    Past Surgical History:  Procedure Laterality Date   ABDOMINAL AORTIC ANEURYSM REPAIR  02/19/2009   performed by [place] N/A 04/28/2017   Procedure: Atrial Fibrillation Ablation;  Surgeon: Constance Haw, MD;  Location: [facility];  Service: Cardiovascular;  Laterality: N/A;   COLONOSCOPY     KNEE ARTHROSCOPY  2003   left   LEFT AND RIGHT HEART CATHETERIZATION WITH CORONARY ANGIOGRAM N/A 03/03/2015   Procedure: LEFT AND RIGHT HEART CATHETERIZATION WITH CORONARY ANGIOGRAM;  Surgeon: Troy Sine, MD;  PERCUTANEOUS CORONARY STENT INTERVENTION (PCI-S) N/A 03/04/2015   Procedure: PERCUTANEOUS CORONARY STENT INTERVENTION (PCI-S);  Surgeon: Troy Sine, MD; RI 99>>0% w/   BMS, bifurcation CFX-OM 90>>40% w/ Angiosculpt PTCA; dCFX 90>>0% w/   2.7520 mm Rebel BMS   SHOULDER ARTHROSCOPY WITH ROTATOR CUFF REPAIR AND SUBACROMIAL DECOMPRESSION Left 11/19/2013   Procedure: LEFT SHOULDER ARTHROSCOPY DEBRIDEMENT EXTENTSIVE DISTAL CLAVICULECTOMY DECOMPRESSION PARTIAL ACROMIOPLASTY WITH CORACOACROMIAL WITH ROTATOR CUFF REPAIR ;   Surgeon: Renette Butters, MD;  Location: [facility];  Service: Orthopedics;  Laterality: Left;       Family History  Problem Relation Age of Onset   Prostate cancer Father    Hypertension Mother    Diabetes Sister    Hypertension Sister    Stroke Neg Hx    Heart attack Neg Hx     Social History   Tobacco Use   Smoking status: Former    Packs/day: 0.50    Years: 50.00    Pack years: 25.00    Types: Cigarettes    Quit date: 03/01/2016    Years since quitting: 5.7   Smokeless tobacco: Never  Vaping Use   Vaping Use: Every day  Substance Use Topics   Alcohol use: Yes    Alcohol/week: 7.0 - 8.0 standard drinks    Types: 6 Cans of beer, 1 - 2 Shots of liquor per week    Comment: couple shots of royal per day   Drug use: No    Home Medications Prior to Admission medications   Medication Sig Start Date End Date Taking? Authorizing Provider  amLODipine (NORVASC) 5 MG tablet Take 1 tablet (5 mg total) daily by mouth. 10/17/17   Hilty, Nadean Corwin, MD  apixaban (ELIQUIS) 5 MG TABS tablet Take 1 tablet (5 mg total) by mouth 2 (two) times daily. 05/26/15   [name], MD  aspirin EC 81 MG tablet Take 81 mg by mouth daily.    [provider]  atorvastatin (LIPITOR) 80 MG tablet Take 1 tablet (80 mg total) by mouth daily. 05/21/15   Hilty, Nadean Corwin, MD  carvedilol (COREG) 12.5 MG tablet Take 1 tablet (12.5 mg total) by mouth 2 (two) times daily with a meal. 03/08/21   Hilty, Nadean Corwin, MD  diltiazem (CARDIZEM) 30 MG tablet Take 1 tablet (30 mg total) by mouth 4 (four) times daily as needed (irregular heart rate >100 and BP>100). 01/11/21   Hilty, Nadean Corwin, MD  famotidine (PEPCID) 40 MG tablet Take 1 tablet (40 mg total) by mouth 2 (two) times daily. 05/01/19   Hilty, Nadean Corwin, MD  fish oil-omega-3 fatty acids 1000 MG capsule Take 1,000 mg by mouth daily.     [provider]  furosemide (LASIX) 20 MG tablet Take 20 mg by mouth daily.    [provider]  irbesartan (AVAPRO) 300 MG tablet TAKE 1/2 TABLET(150 MG) BY MOUTH DAILY 02/24/20   Shirley Friar, PA-C  isosorbide mononitrate (IMDUR) 60 MG 24 hr tablet TAKE 1 TABLET BY MOUTH EVERY DAY ( CAN NOT BE FILL 06.04.2018) 03/14/18   Hilty, Nadean Corwin, MD  KLOR-CON M10 10 MEQ tablet TAKE 1 TABLET BY MOUTH TWICE A DAY 11/19/18   Hilty, Nadean Corwin, MD  loratadine (CLARITIN) 10 MG tablet Take 10 mg by mouth daily as needed (seasonal allergies).     [provider]  Multiple Vitamin (MULTIVITAMIN WITH MINERALS) TABS tablet Take 1 tablet by mouth daily.    [provider]  nitroGLYCERIN (NITROSTAT) 0.4 MG SL tablet Place 1 tablet (0.4 mg total) under the tongue every 5 (five) minutes as needed for chest pain (shortness of breath). 01/11/21   Hilty, Nadean Corwin, MD    Allergies    Lisinopril and Iohexol  Review of Systems   Review of Systems  Respiratory:  Positive for shortness of breath.   Cardiovascular:  Positive for palpitations.  All other systems reviewed and are negative.  Physical Exam Updated Vital Signs BP 118/81   Pulse 84   Temp 97.9 F (36.6 C) (Oral)   Resp (!) 21   SpO2 95%   Physical Exam Vitals and nursing note reviewed.  Constitutional:      Appearance: He is well-developed.  HENT:     Head: Normocephalic and atraumatic.  Cardiovascular:     Rate and Rhythm: Normal rate and regular rhythm.     Heart sounds: No murmur heard. Pulmonary:     Effort: Pulmonary effort is normal. No respiratory distress.     Breath sounds: Normal breath sounds.  Abdominal:     Palpations: Abdomen is soft.     Tenderness: There is no abdominal tenderness. There is no guarding or rebound.  Musculoskeletal:        General: No swelling or tenderness.  Skin:    General: Skin is warm and dry.  Neurological:     Mental Status: He is alert and oriented to person, place, and time.  Psychiatric:        Behavior: Behavior normal.    ED Results / Procedures  / Treatments   Labs (all labs ordered are listed, but only abnormal results are displayed) Labs Reviewed  CBC WITH DIFFERENTIAL/PLATELET - Abnormal; Notable for the following components:      Result Value   Platelets 127 (*)    All other components within normal limits  BASIC METABOLIC PANEL - Abnormal; Notable for the following components:   CO2 20 (*)    Glucose, Bld 126 (*)    All other components within normal limits  MAGNESIUM  TROPONIN I (HIGH SENSITIVITY)  TROPONIN I (HIGH SENSITIVITY)    EKG EKG Interpretation  Date/Time:  Monday November 15 2021 00:38:54 EST Ventricular Rate:  85 PR Interval:    QRS Duration: 140 QT Interval:  412 QTC Calculation: 490 R Axis:   -42 Text Interpretation: Accelerated junctional rhythm RBBB and LAFB Confirmed by Quintella Reichert [phone]) on 11/15/2021 12:47:09 AM  Radiology DG Chest 2 View  Result Date: 11/14/2021 CLINICAL DATA:  Shortness of breath.  Hypertension.  Tachycardia. EXAM: CHEST - 2 VIEW COMPARISON:  09/09/2021 FINDINGS: Heart size is normal. Tortuous aorta. The lungs are clear. The vascularity is normal. No effusions. No significant bone finding. IMPRESSION: No active cardiopulmonary disease. Electronically Signed   By: Nelson Chimes M.D.   On: 11/14/2021 20:15    Procedures Procedures   Medications Ordered in ED Medications - No data to display  ED Course  I have reviewed the triage vital signs and the nursing notes.  Pertinent labs & imaging results that were available during my care of the patient were reviewed by me and considered in my medical decision making (see chart for details).    MDM Rules/Calculators/A&P                          patient with history of coronary artery disease, paroxysmal atrial fibrillation here for evaluation of chest discomfort and palpitations. EKG on ED  presentation with accelerated junctional rhythm. Initial troponin is negative. During ED stay patient went between sinus rhythm and  accelerated junctional rhythm. He has been pain free during his ED stay and has not had palpitations. Repeat troponin increased to the 50s. Discussed with cardiologist on-call, recommends medicine admission at [facility] to telemetry bed. No need for heparin at this time. Patient updated findings of studies recommendation for admission and he is in agreement treatment plan. Hospitalist consulted for admission.  Final Clinical Impression(s) / ED Diagnoses Final diagnoses:  None    Rx / DC Orders ED Discharge Orders     None        Quintella Reichert, MD 11/15/21 [phone]

## 2021-11-15 NOTE — Telephone Encounter (Signed)
Paged by Dr. Ralene Bathe to discuss patient at [facility].  81M with intermittent palpitations over the past few days with known history of PAF on [place].  Denies any cough or lower extremity swelling.  Took 1 dose of diltiazem without any relief.  Had some chest tightness but no focal pain.  Over the past 3 to 4 days he was experiencing palpitations for 3 to 4 hours at a time. Diltiazem does help with his symptoms.  He has occasional shortness of breath with prior AF episodes.  He has been taking diltiazem 30 mg twice daily for the past several days.  Noted CAD with PCI to ramus and LCx 2016.  Also has HLD, HTN, mild OSA, and prior AAA with graft in 2010.  VS in [facility] ED P 84, BP 118/81, T 97.9, RR 21, O2 95% RA Labs with hsT uptrending (7->52).   Discussion with Dr. Ralene Bathe:  3-4 days of palpitations, happening more in evenings. Took 2 prn diltiazem doses. Didn't have palpitations with initial ECG in ED. Then in NSR. Predominately in accelerated junctional rhythm in ED (HR 80-100). Denied any chest pain.  Reviewed ECGs and he initially has what appears to be a junctional tachycardia between 85-100 with known RBBB/LAFB with subsequent normal sinus rhythm on 01:57:51 ECG.  No ischemic changes on any of his ECG was reviewed.  He says he does know what he is in A. fib and pounding in the chest does not necessarily feel like that.  He was otherwise asymptomatic in the emergency department when having the junctional tachycardias.  He does not currently need transport to [facility].  ED will ask medicine for observation and cardiology follow-up in the morning.  Current severity is clinically stable at all vital signs similar to above.

## 2021-11-15 NOTE — H&P (Signed)
History and Physical    Rodney [name] SNK:[id number] DOB: 1941-10-02 DOA: 11/14/2021  PCP: [name], MD   Patient coming from: Home   Chief Complaint: Palpitations, SOB   HPI: Rodney [name] is a very pleasant 80 y.o. male with medical history significant for coronary artery disease with PCI in 2016, paroxysmal atrial fibrillation on Eliquis, hypertension, AAA with stent graft in 2010, now presenting to the emergency department with intermittent palpitations and shortness of breath.  Patient reports recent episodes of palpitations with nausea and shortness of breath, mainly in the evenings.  He has a monitoring device at home that indicates heart rate in the 105 range during these episodes which the monitor does not recognize as atrial fibrillation.  Patient states that when he is in atrial fibrillation, he develops fatigue and palpitations, but the recent episodes are different.  He denies any leg swelling or cough.  Reports that he takes his medications religiously.  He takes diltiazem only as needed for rapid atrial fibrillation but has not taken it the last few days as his monitor indicates he is not in a fib.  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    ED Course: Upon arrival to the ED, patient is found to be afebrile, saturating mid 90s, and with stable blood pressure.  EKG features junctional tachycardia with rate 100, RBBB, and LAFB.  Chest x-ray negative for acute cardiopulmonary disease.  Initial troponin was 7 and then increased to 52 just over an hour later.  Cardiology was consulted by the ED physician and hospitalists were asked to admit.  Review of Systems:  All other systems reviewed and apart from HPI, are negative.  Past Medical History:  Diagnosis Date   AAA (abdominal aortic aneurysm)    stent graft put in 02/2009   Arthritis    CAD (coronary artery disease)    a. cath 2010 b. CAD s/p PCI to ramus and LCX on 03/04/15   Gout     Hyperlipidemia    Hypertension    OSA (obstructive sleep apnea) 09/30/2015   Very mild with AHI 6.2/hr   Paroxysmal atrial fibrillation ([place])    Tobacco abuse     Past Surgical History:  Procedure Laterality Date   ABDOMINAL AORTIC ANEURYSM REPAIR  02/19/2009   performed by [place] N/A 04/28/2017   Procedure: Atrial Fibrillation Ablation;  Surgeon: Constance Haw, MD;  Location: [facility];  Service: Cardiovascular;  Laterality: N/A;   COLONOSCOPY     KNEE ARTHROSCOPY  2003   left   LEFT AND RIGHT HEART CATHETERIZATION WITH CORONARY ANGIOGRAM N/A 03/03/2015   Procedure: LEFT AND RIGHT HEART CATHETERIZATION WITH CORONARY ANGIOGRAM;  Surgeon: Troy Sine, MD;     PERCUTANEOUS CORONARY STENT INTERVENTION (PCI-S) N/A 03/04/2015   Procedure: PERCUTANEOUS CORONARY STENT INTERVENTION (PCI-S);  Surgeon: Troy Sine, MD; RI 99>>0% w/   BMS, bifurcation CFX-OM 90>>40% w/ Angiosculpt PTCA; dCFX 90>>0% w/   2.7520 mm Rebel BMS   SHOULDER ARTHROSCOPY WITH ROTATOR CUFF REPAIR AND SUBACROMIAL DECOMPRESSION Left 11/19/2013   Procedure: LEFT SHOULDER ARTHROSCOPY DEBRIDEMENT EXTENTSIVE DISTAL CLAVICULECTOMY DECOMPRESSION PARTIAL ACROMIOPLASTY WITH CORACOACROMIAL WITH ROTATOR CUFF REPAIR ;  Surgeon: Renette Butters, MD;  Location: [facility];  Service: Orthopedics;  Laterality: Left;    Social History:   reports that he quit smoking about 5 years ago. His smoking use included cigarettes. He has a 25.00 pack-year smoking  history. He has never used smokeless tobacco. He reports current alcohol use of about 7.0 - 8.0 standard drinks per week. He reports that he does not use drugs.  Allergies  Allergen Reactions   Lisinopril Cough   Iohexol Itching, Rash and Other (See Comments)     Code: RASH, Desc: PATIENT STATED THAT HE BEGAN ITCHING TOWARDS END OF INJECTION OF IV CONTRAST-- 50 HR PREP RECOMMENDED/MMS     Family History  Problem Relation Age  of Onset   Prostate cancer Father    Hypertension Mother    Diabetes Sister    Hypertension Sister    Stroke Neg Hx    Heart attack Neg Hx      Prior to Admission medications   Medication Sig Start Date End Date Taking? Authorizing Provider  amLODipine (NORVASC) 5 MG tablet Take 1 tablet (5 mg total) daily by mouth. 10/17/17   Hilty, Nadean Corwin, MD  apixaban (ELIQUIS) 5 MG TABS tablet Take 1 tablet (5 mg total) by mouth 2 (two) times daily. 05/26/15   [name], MD  aspirin EC 81 MG tablet Take 81 mg by mouth daily.    [provider]  atorvastatin (LIPITOR) 80 MG tablet Take 1 tablet (80 mg total) by mouth daily. 05/21/15   Hilty, Nadean Corwin, MD  carvedilol (COREG) 12.5 MG tablet Take 1 tablet (12.5 mg total) by mouth 2 (two) times daily with a meal. 03/08/21   Hilty, Nadean Corwin, MD  diltiazem (CARDIZEM) 30 MG tablet Take 1 tablet (30 mg total) by mouth 4 (four) times daily as needed (irregular heart rate >100 and BP>100). 01/11/21   Hilty, Nadean Corwin, MD  famotidine (PEPCID) 40 MG tablet Take 1 tablet (40 mg total) by mouth 2 (two) times daily. 05/01/19   Hilty, Nadean Corwin, MD  fish oil-omega-3 fatty acids 1000 MG capsule Take 1,000 mg by mouth daily.     [provider]  furosemide (LASIX) 20 MG tablet Take 20 mg by mouth daily.    [provider]  irbesartan (AVAPRO) 300 MG tablet TAKE 1/2 TABLET(150 MG) BY MOUTH DAILY 02/24/20   Shirley Friar, PA-C  isosorbide mononitrate (IMDUR) 60 MG 24 hr tablet TAKE 1 TABLET BY MOUTH EVERY DAY ( CAN NOT BE FILL 06.04.2018) 03/14/18   Hilty, Nadean Corwin, MD  KLOR-CON M10 10 MEQ tablet TAKE 1 TABLET BY MOUTH TWICE A DAY 11/19/18   Hilty, Nadean Corwin, MD  loratadine (CLARITIN) 10 MG tablet Take 10 mg by mouth daily as needed (seasonal allergies).     [provider]  Multiple Vitamin (MULTIVITAMIN WITH MINERALS) TABS tablet Take 1 tablet by mouth daily.    [provider]  nitroGLYCERIN (NITROSTAT) 0.4 MG SL  tablet Place 1 tablet (0.4 mg total) under the tongue every 5 (five) minutes as needed for chest pain (shortness of breath). 01/11/21   [name], MD    Physical Exam: Vitals:   11/14/21 2340 11/15/21 0040 11/15/21 0100 11/15/21 0230  BP: 124/70 118/81 (!) 134/95 137/74  Pulse: (!) 50 84 88 (!) 55  Resp: 15 (!) 21 19 17   Temp:      TempSrc:      SpO2: 98% 95% 97% 97%    Constitutional: NAD, calm  Eyes: PERTLA, lids and conjunctivae normal ENMT: Mucous membranes are moist. Posterior pharynx clear of any exudate or lesions.   Neck: supple, no masses  Respiratory:  no wheezing, no crackles. No accessory muscle use.  Cardiovascular:  S1 & S2 heard, regular rate and rhythm. No extremity edema.   Abdomen: No distension, no tenderness, soft. Bowel sounds active.  Musculoskeletal: no clubbing / cyanosis. No joint deformity upper and lower extremities.   Skin: no significant rashes, lesions, ulcers. Warm, dry, well-perfused. Neurologic: CN 2-12 grossly intact. Moving all extremities. Alert and oriented.  Psychiatric: Very pleasant. Cooperative.    Labs and Imaging on Admission: I have personally reviewed following labs and imaging studies  CBC: Recent Labs  Lab 11/14/21 1928  WBC 4.2  NEUTROABS 2.2  HGB 16.9  HCT 49.0  MCV 96.1  PLT 185*   Basic Metabolic Panel: Recent Labs  Lab 11/14/21 1928  NA 139  K 3.8  CL 111  CO2 20*  GLUCOSE 126*  BUN 15  CREATININE 1.11  CALCIUM 9.0  MG 2.3   GFR: CrCl cannot be calculated (Unknown ideal weight.). Liver Function Tests: No results for input(s): AST, ALT, ALKPHOS, BILITOT, PROT, ALBUMIN in the last 168 hours. No results for input(s): LIPASE, AMYLASE in the last 168 hours. No results for input(s): AMMONIA in the last 168 hours. Coagulation Profile: No results for input(s): INR, PROTIME in the last 168 hours. Cardiac Enzymes: No results for input(s): CKTOTAL, CKMB, CKMBINDEX, TROPONINI in the last 168 hours. BNP (last  3 results) No results for input(s): PROBNP in the last 8760 hours. HbA1C: No results for input(s): HGBA1C in the last 72 hours. CBG: No results for input(s): GLUCAP in the last 168 hours. Lipid Profile: No results for input(s): CHOL, HDL, LDLCALC, TRIG, CHOLHDL, LDLDIRECT in the last 72 hours. Thyroid Function Tests: No results for input(s): TSH, T4TOTAL, FREET4, T3FREE, THYROIDAB in the last 72 hours. Anemia Panel: No results for input(s): VITAMINB12, FOLATE, FERRITIN, TIBC, IRON, RETICCTPCT in the last 72 hours. Urine analysis:    Component Value Date/Time   COLORURINE YELLOW 03/11/2017 2105   APPEARANCEUR CLEAR 03/11/2017 2105   LABSPEC 1.009 03/11/2017 2105   PHURINE 5.0 03/11/2017 2105   GLUCOSEU NEGATIVE 03/11/2017 2105   HGBUR SMALL (A) 03/11/2017 2105   BILIRUBINUR NEGATIVE 03/11/2017 2105   KETONESUR NEGATIVE 03/11/2017 2105   PROTEINUR NEGATIVE 03/11/2017 2105   UROBILINOGEN 0.2 03/16/2015 1225   NITRITE NEGATIVE 03/11/2017 2105   LEUKOCYTESUR NEGATIVE 03/11/2017 2105   Sepsis Labs: @LABRCNTIP (procalcitonin:4,lacticidven:4) )No results found for this or any previous visit (from the past 240 hour(s)).   Radiological Exams on Admission: DG Chest 2 View  Result Date: 11/14/2021 CLINICAL DATA:  Shortness of breath.  Hypertension.  Tachycardia. EXAM: CHEST - 2 VIEW COMPARISON:  09/09/2021 FINDINGS: Heart size is normal. Tortuous aorta. The lungs are clear. The vascularity is normal. No effusions. No significant bone finding. IMPRESSION: No active cardiopulmonary disease. Electronically Signed   By: Nelson Chimes M.D.   On: 11/14/2021 20:15    EKG: Independently reviewed. Junctional tachycardia, rate 100, RBBB, LAFB.   Assessment/Plan   1. CAD; elevated troponin; palpitations  - Presents with 3-4 days of palpitations with nausea and SOB, notes HR to be 105 range but not in a fib during the episodes  - In junctional tachycardia in ED with RBBB and LAFB  - Troponin 7,  then 52 in ED  - Hx of PCI to ramus intermedius and distal circumflex in 2016  - Nuclear stress test in Feb 2022 with fixed inferior infarct, LV EF 48%, and no reversible ischemia  - Appreciate cardiology, planning to consult in am - Continue cardiac monitoring and trend troponin, continue ASA, Lipitor, and  Coreg   2. PAF  - S/p ablation in 2017  - Continue Eliquis, Coreg    3. Ischemic cardiomyopathy  - EF was improved to 55-60% on TTE in Aug 2019  - Appears compensated  - Continue Lasix, Coreg, irbesartan    4. Hypertension  - Continue amlodipine, irbesartan, carvedilol    DVT prophylaxis: Eliquis  Code Status: Full  Level of Care: Level of care: Telemetry Family Communication: None present  Disposition Plan:  Patient is from: Home  Anticipated d/c is to: Home  Anticipated d/c date is: 11/16/21  Patient currently: Pending cardiology consultation  Consults called: cardiology consulted by ED physician  Admission status: Observation     Vianne Bulls, MD [facility]  11/15/2021, 3:19 AM

## 2021-12-20 NOTE — Progress Notes (Signed)
Electrophysiology Office Follow up Visit Note:    Date:  12/21/2021   ID:  Rodney [name], DOB 09-Jan-1941, MRN [id number]  PCP:  [name], MD  [facility] Cardiologist:  [name], MD  [place] Electrophysiologist:  Will Meredith Leeds, MD    Interval History:    Rodney [name] is a 81 y.o. male who presents for a follow up visit. They were last seen in clinic 09/11/2020. He has been seen by Dr [name] and Oda Kilts in clinic. His history includes pAF, HTN, CAD and diastolic HF. He was recently hospitalized 11/14/2021 for palpitations/shortness of breath and had elevated high sensitivity troponins in the setting of a junctional tachycardia.  For his AF, he has a prior history of RF ablation, dofetilide and amiodarone. During the 2018 ablation, the PV were isolated.  Today he tells me he is doing well.  He remains very active.  He played golf yesterday.  He tells me that his palpitations occurred in the setting of a medication change.  He was previously taking famotidine and that was changed to omeprazole.  When he was changed to omeprazole the tachycardia began.  He connected the events and decided to change back to famotidine.  When he changed back to famotidine the palpitations resolved.  He says since he was seen by Dr. Debara Pickett he has not noticed any of the palpitations.  No syncope or presyncope.      Past Medical History:  Diagnosis Date   AAA (abdominal aortic aneurysm)    stent graft put in 02/2009   Arthritis    CAD (coronary artery disease)    a. cath 2010 b. CAD s/p PCI to ramus and LCX on 03/04/15   Gout    Hyperlipidemia    Hypertension    OSA (obstructive sleep apnea) 09/30/2015   Very mild with AHI 6.2/hr   Paroxysmal atrial fibrillation ([place])    Tobacco abuse     Past Surgical History:  Procedure Laterality Date   ABDOMINAL AORTIC ANEURYSM REPAIR  02/19/2009   performed by [place] N/A 04/28/2017   Procedure: Atrial  Fibrillation Ablation;  Surgeon: Constance Haw, MD;  Location: [facility];  Service: Cardiovascular;  Laterality: N/A;   COLONOSCOPY     KNEE ARTHROSCOPY  2003   left   LEFT AND RIGHT HEART CATHETERIZATION WITH CORONARY ANGIOGRAM N/A 03/03/2015   Procedure: LEFT AND RIGHT HEART CATHETERIZATION WITH CORONARY ANGIOGRAM;  Surgeon: Troy Sine, MD;     PERCUTANEOUS CORONARY STENT INTERVENTION (PCI-S) N/A 03/04/2015   Procedure: PERCUTANEOUS CORONARY STENT INTERVENTION (PCI-S);  Surgeon: Troy Sine, MD; RI 99>>0% w/   BMS, bifurcation CFX-OM 90>>40% w/ Angiosculpt PTCA; dCFX 90>>0% w/   2.7520 mm Rebel BMS   SHOULDER ARTHROSCOPY WITH ROTATOR CUFF REPAIR AND SUBACROMIAL DECOMPRESSION Left 11/19/2013   Procedure: LEFT SHOULDER ARTHROSCOPY DEBRIDEMENT EXTENTSIVE DISTAL CLAVICULECTOMY DECOMPRESSION PARTIAL ACROMIOPLASTY WITH CORACOACROMIAL WITH ROTATOR CUFF REPAIR ;  Surgeon: Renette Butters, MD;  Location: [facility];  Service: Orthopedics;  Laterality: Left;    Current Medications: Current Meds  Medication Sig   amLODipine (NORVASC) 5 MG tablet Take 1 tablet (5 mg total) daily by mouth.   apixaban (ELIQUIS) 5 MG TABS tablet Take 1 tablet (5 mg total) by mouth 2 (two) times daily.   aspirin EC 81 MG tablet Take 81 mg by mouth daily.   atorvastatin (LIPITOR) 80 MG tablet Take 1 tablet (80 mg total)  by mouth daily.   carvedilol (COREG) 12.5 MG tablet Take 1 tablet (12.5 mg total) by mouth 2 (two) times daily with a meal.   diltiazem (CARDIZEM) 30 MG tablet Take 1 tablet (30 mg total) by mouth 4 (four) times daily as needed (irregular heart rate >100 and BP>100).   famotidine (PEPCID) 40 MG tablet Take 1 tablet (40 mg total) by mouth 2 (two) times daily.   fish oil-omega-3 fatty acids 1000 MG capsule Take 1,000 mg by mouth daily.    fluticasone (FLONASE) 50 MCG/ACT nasal spray Place 1 spray into both nostrils daily as needed for allergies.   furosemide (LASIX) 20 MG  tablet Take 20 mg by mouth daily.   irbesartan (AVAPRO) 300 MG tablet TAKE 1/2 TABLET(150 MG) BY MOUTH DAILY (Patient taking differently: Take 150 mg by mouth daily.)   isosorbide mononitrate (IMDUR) 60 MG 24 hr tablet TAKE 1 TABLET BY MOUTH EVERY DAY ( CAN NOT BE FILL 06.04.2018) (Patient taking differently: 60 mg daily.)   KLOR-CON M10 10 MEQ tablet TAKE 1 TABLET BY MOUTH TWICE A DAY (Patient taking differently: 10 mEq 2 (two) times daily.)   loratadine (CLARITIN) 10 MG tablet Take 10 mg by mouth daily as needed (seasonal allergies).    Multiple Vitamin (MULTIVITAMIN WITH MINERALS) TABS tablet Take 1 tablet by mouth daily.   nitroGLYCERIN (NITROSTAT) 0.4 MG SL tablet Place 1 tablet (0.4 mg total) under the tongue every 5 (five) minutes as needed for chest pain (shortness of breath).     Allergies:   Lisinopril and Iohexol   Social History   Socioeconomic History   Marital status: Married    Spouse name: Not on file   Number of children: Not on file   Years of education: Not on file   Highest education level: Not on file  Occupational History   Not on file  Tobacco Use   Smoking status: Former    Packs/day: 0.50    Years: 50.00    Pack years: 25.00    Types: Cigarettes    Quit date: 03/01/2016    Years since quitting: 5.8   Smokeless tobacco: Never  Vaping Use   Vaping Use: Every day  Substance and Sexual Activity   Alcohol use: Yes    Alcohol/week: 7.0 - 8.0 standard drinks    Types: 6 Cans of beer, 1 - 2 Shots of liquor per week    Comment: couple shots of royal per day   Drug use: No   Sexual activity: Not Currently    Comment: cutting down 1/2 ppd  Other Topics Concern   Not on file  Social History Narrative   Lives with wife, does not use assist, drives.  No home health services.     Social Determinants of Health   Financial Resource Strain: Not on file  Food Insecurity: Not on file  Transportation Needs: Not on file  Physical Activity: Not on file  Stress: Not  on file  Social Connections: Not on file     Family History: The patient's family history includes Diabetes in his sister; Hypertension in his mother and sister; Prostate cancer in his father. There is no history of Stroke or Heart attack.  ROS:   Please see the history of present illness.    All other systems reviewed and are negative.  EKGs/Labs/Other Studies Reviewed:    The following studies were reviewed today:  11/15/2021 ecg #1 - junctional tachycardia with RBBB and LAFB 11/15/2021 ecg #2 - sinus  bradycardia with RBBB and LAFB  01/14/2021 SPECT - fixed inferior infarct, EF48%  08/07/2018 Echo - EF55-60%  EKG:  The ekg ordered today demonstrates sinus rhythm.  Chronic inferolateral T wave changes  Recent Labs: 11/14/2021: BUN 15; Creatinine, Ser 1.11; Hemoglobin 16.9; Magnesium 2.3; Platelets 127; Potassium 3.8; Sodium 139  Recent Lipid Panel    Component Value Date/Time   CHOL 115 07/23/2018 0008   TRIG 103 07/23/2018 0008   HDL 33 (L) 07/23/2018 0008   CHOLHDL 3.5 07/23/2018 0008   VLDL 21 07/23/2018 0008   LDLCALC 61 07/23/2018 0008    Physical Exam:    VS:  BP 126/72    Pulse (!) 54    Ht 6\' 2"  (1.88 m)    Wt 215 lb 6.4 oz (97.7 kg)    SpO2 99%    BMI 27.66 kg/m     Wt Readings from Last 3 Encounters:  12/21/21 215 lb 6.4 oz (97.7 kg)  03/08/21 217 lb (98.4 kg)  01/14/21 213 lb (96.6 kg)     GEN:  Well nourished, well developed in no acute distress HEENT: Normal NECK: No JVD; No carotid bruits LYMPHATICS: No lymphadenopathy CARDIAC: RRR, no murmurs, rubs, gallops RESPIRATORY:  Clear to auscultation without rales, wheezing or rhonchi  ABDOMEN: Soft, non-tender, non-distended MUSCULOSKELETAL:  No edema; No deformity  SKIN: Warm and dry NEUROLOGIC:  Alert and oriented x 3 PSYCHIATRIC:  Normal affect        ASSESSMENT:    1. Junctional tachycardia (HCC)   2. PAF (paroxysmal atrial fibrillation) ([place])   3. Coronary artery disease involving native  coronary artery of native heart without angina pectoris   4. Chronic diastolic congestive heart failure (HCC)   5. Palpitations    PLAN:    In order of problems listed above:  #Junctional tachycardia Unclear etiology although it is very suspicious for medication effect given the timeline described above.  I would continue avoiding PPI and stay on famotidine.  I would like to order a 2-week ZIO monitor to make sure we are not missing any other arrhythmias.  We will plan to see him back in clinic in 6 to 8 weeks to review the results.  He should remain active in the meantime.  #Paroxysmal atrial fibrillation No recurrence that he is aware of.  2-week ZIO monitor as above  #Coronary artery disease No ischemic symptoms    F/u 6-8 weeks with APP.  If heart monitor without significant abnormalities and patient doing well from a symptom perspective, would recommend routine follow-up with Dr. Debara Pickett.    Medication Adjustments/Labs and Tests Ordered: Current medicines are reviewed at length with the patient today.  Concerns regarding medicines are outlined above.  Orders Placed This Encounter  Procedures   EKG 12-Lead   No orders of the defined types were placed in this encounter.    Signed, Lars Mage, MD, [facility], [facility] 12/21/2021 8:20 AM    Electrophysiology [facility]

## 2021-12-21 ENCOUNTER — Ambulatory Visit (INDEPENDENT_AMBULATORY_CARE_PROVIDER_SITE_OTHER): Payer: Medicare Other

## 2021-12-21 ENCOUNTER — Encounter: Payer: Self-pay | Admitting: Cardiology

## 2021-12-21 ENCOUNTER — Other Ambulatory Visit: Payer: Self-pay

## 2021-12-21 ENCOUNTER — Ambulatory Visit: Payer: Medicare Other | Admitting: Cardiology

## 2021-12-21 VITALS — BP 126/72 | HR 54 | Ht 74.0 in | Wt 215.4 lb

## 2021-12-21 DIAGNOSIS — I480 Paroxysmal atrial fibrillation: Secondary | ICD-10-CM | POA: Diagnosis not present

## 2021-12-21 DIAGNOSIS — I471 Supraventricular tachycardia: Secondary | ICD-10-CM | POA: Diagnosis not present

## 2021-12-21 DIAGNOSIS — I2510 Atherosclerotic heart disease of native coronary artery without angina pectoris: Secondary | ICD-10-CM

## 2021-12-21 DIAGNOSIS — I4719 Other supraventricular tachycardia: Secondary | ICD-10-CM

## 2021-12-21 DIAGNOSIS — R002 Palpitations: Secondary | ICD-10-CM

## 2021-12-21 DIAGNOSIS — I5032 Chronic diastolic (congestive) heart failure: Secondary | ICD-10-CM | POA: Diagnosis not present

## 2021-12-21 NOTE — Patient Instructions (Addendum)
Medication Instructions:  Your physician recommends that you continue on your current medications as directed. Please refer to the Current Medication list given to you today. *If you need a refill on your cardiac medications before your next appointment, please call your pharmacy*  Lab Work: None. If you have labs (blood work) drawn today and your tests are completely normal, you will receive your results only by: [place] (if you have MyChart) OR A paper copy in the mail If you have any lab test that is abnormal or we need to change your treatment, we will call you to review the results.  Testing/Procedures: Your physician has recommended that you wear a heart monitor. Heart monitors are medical devices that record the hearts electrical activity. Doctors most often use these monitors to diagnose arrhythmias. Arrhythmias are problems with the speed or rhythm of the heartbeat. The monitor is a small, portable device. You can wear one while you do your normal daily activities. This is usually used to diagnose what is causing palpitations/syncope (passing out).   Follow-Up: At [facility], you and your health needs are our priority.  As part of our continuing mission to provide you with exceptional heart care, we have created designated Provider Care Teams.  These Care Teams include your primary Cardiologist (physician) and Advanced Practice Providers (APPs -  Physician Assistants and Nurse Practitioners) who all work together to provide you with the care you need, when you need it.  Your physician wants you to follow-up in: 6-8 weeks with one of the following Advanced Practice Providers on your designated Care Team:    Rodney [name], Rodney [name] "Jonni Sanger" Rodney [name], Rodney  We recommend signing up for the patient portal called "MyChart".  Sign up information is provided on this After Visit Summary.  MyChart is used to connect with patients for Virtual Visits (Telemedicine).  Patients are able  to view lab/test results, encounter notes, upcoming appointments, etc.  Non-urgent messages can be sent to your provider as well.   To learn more about what you can do with MyChart, go to [email or web address].    Any Other Special Instructions Will Be Listed Below (If Applicable).  ZIO XT- Long Term Monitor Instructions  Your physician has requested you wear a ZIO patch monitor for 14 days.  This is a single patch monitor. [employer] supplies one patch monitor per enrollment. Additional stickers are not available. Please do not apply patch if you will be having a Nuclear Stress Test,  Echocardiogram, Cardiac CT, MRI, or Chest Xray during the period you would be wearing the  monitor. The patch cannot be worn during these tests. You cannot remove and re-apply the  ZIO XT patch monitor.  Your ZIO patch monitor will be mailed 3 day USPS to your address on file. It may take 3-5 days  to receive your monitor after you have been enrolled.  Once you have received your monitor, please review the enclosed instructions. Your monitor  has already been registered assigning a specific monitor serial # to you.  Billing and Patient Assistance Program Information  We have supplied [employer] with any of your insurance information on file for billing purposes. [employer] offers a sliding scale Patient Assistance Program for patients that do not have  insurance, or whose insurance does not completely cover the cost of the ZIO monitor.  You must apply for the Patient Assistance Program to qualify for this discounted rate.  To apply, please call [employer] at [phone], select option 4, select option 2,  ask to apply for  Patient Assistance Program. Theodore Demark will ask your household income, and how many people  are in your household. They will quote your out-of-pocket cost based on that information.  [employer] will also be able to set up a 70-month, interest-free payment plan if needed.  Applying the monitor   Shave  hair from upper left chest.  Hold abrader disc by orange tab. Rub abrader in 40 strokes over the upper left chest as  indicated in your monitor instructions.  Clean area with 4 enclosed alcohol pads. Let dry.  Apply patch as indicated in monitor instructions. Patch will be placed under collarbone on left  side of chest with arrow pointing upward.  Rub patch adhesive wings for 2 minutes. Remove white label marked "1". Remove the white  label marked "2". Rub patch adhesive wings for 2 additional minutes.  While looking in a mirror, press and release button in center of patch. A small green light will  flash 3-4 times. This will be your only indicator that the monitor has been turned on.  Do not shower for the first 24 hours. You may shower after the first 24 hours.  Press the button if you feel a symptom. You will hear a small click. Record Date, Time and  Symptom in the Patient Logbook.  When you are ready to remove the patch, follow instructions on the last 2 pages of Patient  Logbook. Stick patch monitor onto the last page of Patient Logbook.  Place Patient Logbook in the blue and white box. Use locking tab on box and tape box closed  securely. The blue and white box has prepaid postage on it. Please place it in the mailbox as  soon as possible. Your physician should have your test results approximately 7 days after the  monitor has been mailed back to [facility].  Call [place] at [phone] if you have questions regarding  your ZIO XT patch monitor. Call them immediately if you see an orange light blinking on your  monitor.  If your monitor falls off in less than 4 days, contact our Monitor department at [phone].  If your monitor becomes loose or falls off after 4 days call [employer] at [phone] for  suggestions on securing your monitor

## 2021-12-21 NOTE — Progress Notes (Unsigned)
Enrolled patient for a 14 day Zio XT  monitor to be mailed to patients home  °

## 2022-01-30 NOTE — Progress Notes (Unsigned)
Cardiology Office Note Date:  01/30/2022  Patient ID:  Rodney [name] 07-05-1941, MRN [id number] PCP:  [name], MD  Cardiologist:  Dr. Debara Pickett Electrophysiologist: Dr. [name]    Chief Complaint:   Planned follow up   History of Present Illness: Rodney [name] is a 81 y.o. male with history of CAD (PCI to RI & CFX 2016), HTN, HLD, AAA s/p stent grafting 2012 (Dr. Kellie Simmering), Paroxysmal AFib, ICM (EF 28% on 02/2015 by myoview but preserved on his 02/2018 echo), chronic CHF (diastolic), mild OSA not requiring therapy.  He comes in today to be seen for Dr. Quentin Ore.  He was seen jan 2023 by Dr. Quentin Ore as a second opinion for a post hospital visit where he had been with palpitations found in a junctional tachycardia and abn HS Trop, these felt to be demand associated with tachycardia, not planned for new ischemic eval The pt correlated the onset of palpitations with a change from famotidine to omeprazole and better since back on famotidine. Planned for monitoring to assess for recurrence and or other arrhythmias.  If he was doing well, without symptoms at a 6-8 week follow up, planned to have him have routine f/u with Dr. Debara Pickett.  *** symptoms *** eliquis, bleeding, dose *** Meds, CAD *** monitor pending ??    AFib Hx PVI ablation, Dr. [name], 04/28/17 AAD hx Tikosyn >> stopped 03/2017, recurrent AFib Amiodarone >> stopped Aug 2018, maintaining SR post ablation, and c/o fatigue, bradycardia   Past Medical History:  Diagnosis Date   AAA (abdominal aortic aneurysm)    stent graft put in 02/2009   Arthritis    CAD (coronary artery disease)    a. cath 2010 b. CAD s/p PCI to ramus and LCX on 03/04/15   Gout    Hyperlipidemia    Hypertension    OSA (obstructive sleep apnea) 09/30/2015   Very mild with AHI 6.2/hr   Paroxysmal atrial fibrillation ([place])    Tobacco abuse     Past Surgical History:  Procedure Laterality Date   ABDOMINAL AORTIC ANEURYSM REPAIR  02/19/2009    performed by [facility] N/A 04/28/2017   Procedure: Atrial Fibrillation Ablation;  Surgeon: Constance Haw, MD;  Location: [place] [facility];  Service: Cardiovascular;  Laterality: N/A;   COLONOSCOPY     KNEE ARTHROSCOPY  2003   left   LEFT AND RIGHT HEART CATHETERIZATION WITH CORONARY ANGIOGRAM N/A 03/03/2015   Procedure: LEFT AND RIGHT HEART CATHETERIZATION WITH CORONARY ANGIOGRAM;  Surgeon: Troy Sine, MD;     PERCUTANEOUS CORONARY STENT INTERVENTION (PCI-S) N/A 03/04/2015   Procedure: PERCUTANEOUS CORONARY STENT INTERVENTION (PCI-S);  Surgeon: Troy Sine, MD; RI 99>>0% w/   BMS, bifurcation CFX-OM 90>>40% w/ Angiosculpt PTCA; dCFX 90>>0% w/   2.7520 mm Rebel BMS   SHOULDER ARTHROSCOPY WITH ROTATOR CUFF REPAIR AND SUBACROMIAL DECOMPRESSION Left 11/19/2013   Procedure: LEFT SHOULDER ARTHROSCOPY DEBRIDEMENT EXTENTSIVE DISTAL CLAVICULECTOMY DECOMPRESSION PARTIAL ACROMIOPLASTY WITH CORACOACROMIAL WITH ROTATOR CUFF REPAIR ;  Surgeon: Renette Butters, MD;  Location: [facility];  Service: Orthopedics;  Laterality: Left;    Current Outpatient Medications  Medication Sig Dispense Refill   amLODipine (NORVASC) 5 MG tablet Take 1 tablet (5 mg total) daily by mouth. 90 tablet 3   apixaban (ELIQUIS) 5 MG TABS tablet Take 1 tablet (5 mg total) by mouth 2 (two) times daily. 180 tablet 3   aspirin EC 81 MG tablet Take 81 mg by  mouth daily.     atorvastatin (LIPITOR) 80 MG tablet Take 1 tablet (80 mg total) by mouth daily. 90 tablet 3   carvedilol (COREG) 12.5 MG tablet Take 1 tablet (12.5 mg total) by mouth 2 (two) times daily with a meal. 180 tablet 3   diltiazem (CARDIZEM) 30 MG tablet Take 1 tablet (30 mg total) by mouth 4 (four) times daily as needed (irregular heart rate >100 and BP>100). 45 tablet 2   famotidine (PEPCID) 40 MG tablet Take 1 tablet (40 mg total) by mouth 2 (two) times daily. 180 tablet 2   fish oil-omega-3 fatty acids 1000 MG capsule  Take 1,000 mg by mouth daily.      fluticasone (FLONASE) 50 MCG/ACT nasal spray Place 1 spray into both nostrils daily as needed for allergies.     furosemide (LASIX) 20 MG tablet Take 20 mg by mouth daily.     irbesartan (AVAPRO) 300 MG tablet TAKE 1/2 TABLET(150 MG) BY MOUTH DAILY (Patient taking differently: Take 150 mg by mouth daily.) 90 tablet 3   isosorbide mononitrate (IMDUR) 60 MG 24 hr tablet TAKE 1 TABLET BY MOUTH EVERY DAY ( CAN NOT BE FILL 06.04.2018) (Patient taking differently: 60 mg daily.) 90 tablet 2   KLOR-CON M10 10 MEQ tablet TAKE 1 TABLET BY MOUTH TWICE A DAY (Patient taking differently: 10 mEq 2 (two) times daily.) 180 tablet 3   loratadine (CLARITIN) 10 MG tablet Take 10 mg by mouth daily as needed (seasonal allergies).      Multiple Vitamin (MULTIVITAMIN WITH MINERALS) TABS tablet Take 1 tablet by mouth daily.     nitroGLYCERIN (NITROSTAT) 0.4 MG SL tablet Place 1 tablet (0.4 mg total) under the tongue every 5 (five) minutes as needed for chest pain (shortness of breath). 25 tablet 3   No current facility-administered medications for this visit.    Allergies:   Lisinopril and Iohexol   Social History:  The patient  reports that he quit smoking about 5 years ago. His smoking use included cigarettes. He has a 25.00 pack-year smoking history. He has never used smokeless tobacco. He reports current alcohol use of about 7.0 - 8.0 standard drinks per week. He reports that he does not use drugs.   Family History:  The patient's family history includes Diabetes in his sister; Hypertension in his mother and sister; Prostate cancer in his father.  ROS:  Please see the history of present illness.  All other systems are reviewed and otherwise negative.   PHYSICAL EXAM:  VS:  There were no vitals taken for this visit. BMI: There is no height or weight on file to calculate BMI. Well nourished, well developed, in no acute distress  HEENT: normocephalic, atraumatic  Neck: no JVD,  carotid bruits or masses Cardiac:  *** RRR; extrasystoles appreciated, no significant murmurs, no rubs, or gallops Lungs:   *** CTA b/l, no wheezing, rhonchi or rales  Abd: soft, nontender MS: no deformity or  atrophy Ext: *** no edema  Skin: warm and dry, no rash Neuro:  No gross deficits appreciated Psych: euthymic mood, full affect     EKG:  Not done today    30 day EM, Jan 2020 Paroxysmal narrow complex tachycardia, suspect PAF (although labeled NSVT).   08/07/18: TTE Study Conclusions - Left ventricle: Systolic function was normal. The estimated   ejection fraction was in the range of 55% to 60%. Wall motion was   normal; there were no regional wall motion abnormalities. Doppler  parameters are consistent with abnormal left ventricular   relaxation (grade 1 diastolic dysfunction).   Impressions: - Left ventricular systolic function has further improved compared   to the previous studies.   07/27/18: Lexiscan stress test EKG is nondiagnostic due to baseline changes Decreased tracer activity in the inferior and basal inferoseptal wall consistent with possible scar and/or soft tissue attenuation No ischemia. LVEF calculated at 39% Recommend echo to further define LVEF and wall motion if not already done Intermediate risk study  04/28/17: EPS/Ablation CONCLUSIONS: 1. Sinus rhythm upon presentation.   2. Successful electrical isolation and anatomical encircling of all four pulmonary veins with radiofrequency current. 3. No inducible arrhythmias following ablation both on and off of dobutamine 4. No early apparent complications.  Recent Labs: 11/14/2021: BUN 15; Creatinine, Ser 1.11; Hemoglobin 16.9; Magnesium 2.3; Platelets 127; Potassium 3.8; Sodium 139  No results found for requested labs within last 8760 hours.   CrCl cannot be calculated (Patient's most recent lab result is older than the maximum 21 days allowed.).   Wt Readings from Last 3 Encounters:  12/21/21  215 lb 6.4 oz (97.7 kg)  03/08/21 217 lb (98.4 kg)  01/14/21 213 lb (96.6 kg)     Other studies reviewed: Additional studies/records reviewed today include: summarized above  ASSESSMENT AND PLAN:  1. Paroxysmal AFib     CHA2DS2Vasc is at least ***4, on Eliquis, appropriately dosed      [facility]     ***  2. HTN     ***  3. CAD     No anginal symptoms     *** On ASA, statin, BB     C/w Dr. Debara Pickett  4. Chronic CHF (diastolic)     *** No symptoms or exam findings to suggest fluid OL     *** Weight is stable     Disposition: ***     Current medicines are reviewed at length with the patient today.  The patient did not have any concerns regarding medicines.  Venetia Night, PA-C 01/30/2022 12:56 PM     [place]  [place] [id number] [phone] (office)  [phone] (fax)

## 2022-01-31 ENCOUNTER — Telehealth: Payer: Self-pay | Admitting: *Deleted

## 2022-01-31 NOTE — Telephone Encounter (Signed)
Patient called regarding following message from [facility].  Hello,  This email is to notify you that we received the patients Zio patch box back; however, the box was empty. The Zio patch may have fallen out during transit, or the patient may have forgotten to send it back in the box. We advise you to reach out to the patient to see if they sent it back in the box. If they have the patch, we can ship them a replacement return box. If they state they have shipped it back, we advise to re-patch the patient.   [id number]    Warm regards,  [employer] Customer Care  Patient willing to wear a replacement 14 day ZIO XT monitor.  [employer] representative contacted to ship patient a replacement. 02/01/22 follow up appt with Tommye Standard to be rescheduled 30 days out. Covering CMA notified.

## 2022-02-02 ENCOUNTER — Ambulatory Visit: Payer: Medicare Other | Admitting: Physician Assistant

## 2022-02-02 DIAGNOSIS — I2510 Atherosclerotic heart disease of native coronary artery without angina pectoris: Secondary | ICD-10-CM | POA: Diagnosis not present

## 2022-02-02 DIAGNOSIS — I480 Paroxysmal atrial fibrillation: Secondary | ICD-10-CM | POA: Diagnosis not present

## 2022-02-02 DIAGNOSIS — I471 Supraventricular tachycardia: Secondary | ICD-10-CM | POA: Diagnosis not present

## 2022-02-02 DIAGNOSIS — I5032 Chronic diastolic (congestive) heart failure: Secondary | ICD-10-CM | POA: Diagnosis not present

## 2022-02-02 DIAGNOSIS — R002 Palpitations: Secondary | ICD-10-CM

## 2022-02-18 ENCOUNTER — Other Ambulatory Visit: Payer: Self-pay | Admitting: Internal Medicine

## 2022-02-27 NOTE — Progress Notes (Addendum)
? ?Cardiology Office Note ?Date:  02/27/2022  ?Patient ID:  Rodney [name], Rodney [name] [birth year], MRN [id number] ?PCP:  [name], MD  ?Cardiologist:  Dr. Debara Pickett ?Electrophysiologist: Dr. [name] >> Dr. Quentin Ore ? ?  ?Chief Complaint:   planned follow up ? ? ?History of Present Illness: ?Rodney [name] is a 81 y.o. male with history of CAD (PCI to RI & CFX 2016), HTN, HLD, AAA s/p stent grafting 2012 (Dr. Kellie Simmering), Paroxysmal AFib, ICM (EF 28% on 02/2015 by myoview but preserved on his 02/2018 echo), chronic CHF (diastolic), mild OSA not requiring therapy. ? ?He comes in today to be seen for Dr. Quentin Ore. ? ?He was seen Jan 2023 by Dr. Quentin Ore as a second opinion for a post hospital visit where he had been with palpitations found in a junctional tachycardia and abn HS Trop, these felt to be demand associated with tachycardia, not planned for new ischemic eval ?The pt correlated the onset of palpitations with a change from famotidine to omeprazole and better since back on famotidine. ?Planned for monitoring to assess for recurrence and or other arrhythmias.  If he was doing well, without symptoms at a 6-8 week follow up, planned to have him have routine f/u with Dr. Debara Pickett. ? ?Monitor looked good, no changes recommended by Dr. Quentin Ore ? ?TODAY ?When he is in AFib he feels poorly, tired. ?When not in AFib has minimal exertional icapacities, cuts his grass, golfs, stays quite active. ?NO CP SOB ?NO near syncope or syncope. ?No bleeding or signs of bleeding ? ?Has had a scratchy throat for a few months, no cough ? ?Burden of his AF is difficult to nail down, goes stretches with none, then has a few days or so with a few hours in/out.  His episodes are hours long, not days. ?He does not recall specifics, can tell when he is out of rhythm, not particularly fast HRs in AF that he recalls/feels ? ? ? ?AFib Hx ?PVI ablation, Dr. [name], 04/28/17 ?AAD hx ?Tikosyn >> stopped 03/2017, recurrent AFib ?Amiodarone >> stopped Aug 2018,  maintaining SR post ablation, and c/o fatigue, bradycardia ? ? ?Past Medical History:  ?Diagnosis Date  ? AAA (abdominal aortic aneurysm)   ? stent graft put in 02/2009  ? Arthritis   ? CAD (coronary artery disease)   ? a. cath 2010 b. CAD s/p PCI to ramus and LCX on 03/04/15  ? Gout   ? Hyperlipidemia   ? Hypertension   ? OSA (obstructive sleep apnea) 09/30/2015  ? Very mild with AHI 6.2/hr  ? Paroxysmal atrial fibrillation (HCC)   ? Tobacco abuse   ? ? ?Past Surgical History:  ?Procedure Laterality Date  ? ABDOMINAL AORTIC ANEURYSM REPAIR  02/19/2009  ? performed by [facility]  ? ATRIAL FIBRILLATION ABLATION N/A 04/28/2017  ? Procedure: Atrial Fibrillation Ablation;  Surgeon: Constance Haw, MD;  Location: [facility];  Service: Cardiovascular;  Laterality: N/A;  ? COLONOSCOPY    ? KNEE ARTHROSCOPY  2003  ? left  ? LEFT AND RIGHT HEART CATHETERIZATION WITH CORONARY ANGIOGRAM N/A 03/03/2015  ? Procedure: LEFT AND RIGHT HEART CATHETERIZATION WITH CORONARY ANGIOGRAM;  Surgeon: Troy Sine, MD;    ? PERCUTANEOUS CORONARY STENT INTERVENTION (PCI-S) N/A 03/04/2015  ? Procedure: PERCUTANEOUS CORONARY STENT INTERVENTION (PCI-S);  Surgeon: Troy Sine, MD; RI 99>>0% w/   BMS, bifurcation CFX-OM 90>>40% w/ Angiosculpt PTCA; dCFX 90>>0% w/   2.75?20 mm Rebel BMS  ? SHOULDER ARTHROSCOPY WITH ROTATOR CUFF REPAIR  AND SUBACROMIAL DECOMPRESSION Left 11/19/2013  ? Procedure: LEFT SHOULDER ARTHROSCOPY DEBRIDEMENT EXTENTSIVE DISTAL CLAVICULECTOMY DECOMPRESSION PARTIAL ACROMIOPLASTY WITH CORACOACROMIAL WITH ROTATOR CUFF REPAIR ;  Surgeon: Renette Butters, MD;  Location: [place];  Service: Orthopedics;  Laterality: Left;  ? ? ?Current Outpatient Medications  ?Medication Sig Dispense Refill  ? amLODipine (NORVASC) 5 MG tablet Take 1 tablet (5 mg total) daily by mouth. 90 tablet 3  ? apixaban (ELIQUIS) 5 MG TABS tablet Take 1 tablet (5 mg total) by mouth 2 (two) times daily. 180 tablet 3  ? aspirin EC 81 MG  tablet Take 81 mg by mouth daily.    ? atorvastatin (LIPITOR) 80 MG tablet Take 1 tablet (80 mg total) by mouth daily. 90 tablet 3  ? carvedilol (COREG) 12.5 MG tablet Take 1 tablet (12.5 mg total) by mouth 2 (two) times daily with a meal. 180 tablet 3  ? diltiazem (CARDIZEM) 30 MG tablet TAKE 1 TABLET BY MOUTH FOUR TIMES DAILY AS NEEDED FOR IRREGULAR HEART RATE GREATER THAN 100 AND BLOOD PRESSURE GREATER THAN 100 45 tablet 6  ? famotidine (PEPCID) 40 MG tablet Take 1 tablet (40 mg total) by mouth 2 (two) times daily. 180 tablet 2  ? fish oil-omega-3 fatty acids 1000 MG capsule Take 1,000 mg by mouth daily.     ? fluticasone (FLONASE) 50 MCG/ACT nasal spray Place 1 spray into both nostrils daily as needed for allergies.    ? furosemide (LASIX) 20 MG tablet Take 20 mg by mouth daily.    ? irbesartan (AVAPRO) 300 MG tablet TAKE 1/2 TABLET(150 MG) BY MOUTH DAILY (Patient taking differently: Take 150 mg by mouth daily.) 90 tablet 3  ? isosorbide mononitrate (IMDUR) 60 MG 24 hr tablet TAKE 1 TABLET BY MOUTH EVERY DAY ( CAN NOT BE FILL 06.04.2018) (Patient taking differently: 60 mg daily.) 90 tablet 2  ? KLOR-CON M10 10 MEQ tablet TAKE 1 TABLET BY MOUTH TWICE A DAY (Patient taking differently: 10 mEq 2 (two) times daily.) 180 tablet 3  ? loratadine (CLARITIN) 10 MG tablet Take 10 mg by mouth daily as needed (seasonal allergies).     ? Multiple Vitamin (MULTIVITAMIN WITH MINERALS) TABS tablet Take 1 tablet by mouth daily.    ? nitroGLYCERIN (NITROSTAT) 0.4 MG SL tablet DISSOLVE 1 TABLET UNDER THE TONGUE EVERY 5 MINUTES AS NEEDED FOR CHEST PAIN( SHORTNESS OF BREATH) 25 tablet 3  ? ?No current facility-administered medications for this visit.  ? ? ?Allergies:   Lisinopril and Iohexol  ? ?Social History:  The patient  reports that he quit smoking about 5 years ago. His smoking use included cigarettes. He has a 25.00 pack-year smoking history. He has never used smokeless tobacco. He reports current alcohol use of about 7.0 -  8.0 standard drinks per week. He reports that he does not use drugs.  ? ?Family History:  The patient's family history includes Diabetes in his sister; Hypertension in his mother and sister; Prostate cancer in his father. ? ?ROS:  Please see the history of present illness.  ?All other systems are reviewed and otherwise negative.  ? ?PHYSICAL EXAM:  ?VS:  There were no vitals taken for this visit. BMI: There is no height or weight on file to calculate BMI. ?Well nourished, well developed, in no acute distress  ?HEENT: normocephalic, atraumatic  ?Neck: no JVD, carotid bruits or masses ?Cardiac:   RRR; extrasystoles appreciated, no significant murmurs, no rubs, or gallops ?Lungs:   CTA b/l,  no wheezing, rhonchi or rales  ?Abd: soft, nontender ?MS: no deformity or  atrophy ?Ext: no edema  ?Skin: warm and dry, no rash ?Neuro:  No gross deficits appreciated ?Psych: euthymic mood, full affect ? ? ? ? ?EKG:  Not done today ? ?March 2023: Monitor ?HR 40 - 169 bpm, average 61 bpm. ?8 SVT, longest lasting 16 beats at a rate of 141 bpm. ?Rare supraventricular ectopy. ?Occasional ventricular ectopy, 3.1%. ?No sustained arrhythmias ? ?30 day EM, Jan 2020 ?Paroxysmal narrow complex tachycardia, suspect PAF (although labeled NSVT).  ? ?08/07/18: TTE ?Study Conclusions ?- Left ventricle: Systolic function was normal. The estimated ?  ejection fraction was in the range of 55% to 60%. Wall motion was ?  normal; there were no regional wall motion abnormalities. Doppler ?  parameters are consistent with abnormal left ventricular ?  relaxation (grade 1 diastolic dysfunction). ?  ?Impressions: ?- Left ventricular systolic function has further improved compared ?  to the previous studies. ? ? ?07/27/18: Lexiscan stress test ?EKG is nondiagnostic due to baseline changes ?Decreased tracer activity in the inferior and basal inferoseptal wall consistent with possible scar and/or soft tissue attenuation No ischemia. ?LVEF calculated at 39%  Recommend echo to further define LVEF and wall motion if not already done ?Intermediate risk study ? ?04/28/17: EPS/Ablation ?CONCLUSIONS: ?1. Sinus rhythm upon presentation.   ?2. Successful electrical isolation an

## 2022-03-02 ENCOUNTER — Ambulatory Visit: Payer: Medicare Other | Admitting: Physician Assistant

## 2022-03-02 ENCOUNTER — Encounter: Payer: Self-pay | Admitting: Physician Assistant

## 2022-03-02 ENCOUNTER — Other Ambulatory Visit: Payer: Self-pay

## 2022-03-02 VITALS — BP 126/68 | HR 64 | Ht 74.0 in | Wt 215.6 lb

## 2022-03-02 DIAGNOSIS — I480 Paroxysmal atrial fibrillation: Secondary | ICD-10-CM | POA: Diagnosis not present

## 2022-03-02 DIAGNOSIS — I5032 Chronic diastolic (congestive) heart failure: Secondary | ICD-10-CM

## 2022-03-02 DIAGNOSIS — I10 Essential (primary) hypertension: Secondary | ICD-10-CM

## 2022-03-02 DIAGNOSIS — I2510 Atherosclerotic heart disease of native coronary artery without angina pectoris: Secondary | ICD-10-CM

## 2022-03-02 NOTE — Patient Instructions (Signed)
Medication Instructions:  ? ?Your physician recommends that you continue on your current medications as directed. Please refer to the Current Medication list given to you today. ? ?*If you need a refill on your cardiac medications before your next appointment, please call your pharmacy* ? ? ?Lab Work: [place] ? ? ?If you have labs (blood work) drawn today and your tests are completely normal, you will receive your results only by: ?MyChart Message (if you have MyChart) OR ?A paper copy in the mail ?If you have any lab test that is abnormal or we need to change your treatment, we will call you to review the results. ? ? ?Testing/Procedures: NONE ORDERED  TODAY ? ? ?Follow-Up: ?At [facility], you and your health needs are our priority.  As part of our continuing mission to provide you with exceptional heart care, we have created designated Provider Care Teams.  These Care Teams include your primary Cardiologist (physician) and Advanced Practice Providers (APPs -  Physician Assistants and Nurse Practitioners) who all work together to provide you with the care you need, when you need it. ? ?We recommend signing up for the patient portal called "MyChart".  Sign up information is provided on this After Visit Summary.  MyChart is used to connect with patients for Virtual Visits (Telemedicine).  Patients are able to view lab/test results, encounter notes, upcoming appointments, etc.  Non-urgent messages can be sent to your provider as well.   ?To learn more about what you can do with MyChart, go to [email or web address].   ? ?Your next appointment:   RECALL HILTY ( NEEDS TO BE SCHEDULED ?6 month(s) ? ?The format for your next appointment:   ?In Person ? ?Provider:   ?You may see Dr. Quentin Ore  or o ? ?Other Instructions ? ?

## 2022-08-21 NOTE — Progress Notes (Unsigned)
Electrophysiology Office Follow up Visit Note:    Date:  08/21/2022   ID:  Rodney [name], DOB Jun 01, 1941, MRN [id number]  PCP:  [name], MD  [facility] Cardiologist:  [name], MD  [place] Electrophysiologist:  Will Meredith Leeds, MD    Interval History:    Rodney [name] is a 81 y.o. male who presents for a follow up visit. They were last seen in clinic March 02, 2022 by [facility].  The patient has an extensive past medical history including coronary disease post PCI's, hypertension, hyperlipidemia, AAA post stent grafting in 2012, paroxysmal atrial fibrillation, ischemic cardiomyopathy with an ejection fraction of 20% in 2016 and now recovered on 2019 echo, mild obstructive sleep apnea.  At the appointment with Joseph Art he reported possible symptoms related to atrial fibrillation.  The patient has previously been ablated by Dr. [name] in 2018.  He previously used dofetilide and amiodarone but did not tolerate either.  He takes Eliquis for stroke prophylaxis.     Past Medical History:  Diagnosis Date   AAA (abdominal aortic aneurysm)    stent graft put in 02/2009   Arthritis    CAD (coronary artery disease)    a. cath 2010 b. CAD s/p PCI to ramus and LCX on 03/04/15   Gout    Hyperlipidemia    Hypertension    OSA (obstructive sleep apnea) 09/30/2015   Very mild with AHI 6.2/hr   Paroxysmal atrial fibrillation ([place])    Tobacco abuse     Past Surgical History:  Procedure Laterality Date   ABDOMINAL AORTIC ANEURYSM REPAIR  02/19/2009   performed by [place] N/A 04/28/2017   Procedure: Atrial Fibrillation Ablation;  Surgeon: Constance Haw, MD;  Location: [facility];  Service: Cardiovascular;  Laterality: N/A;   COLONOSCOPY     KNEE ARTHROSCOPY  2003   left   LEFT AND RIGHT HEART CATHETERIZATION WITH CORONARY ANGIOGRAM N/A 03/03/2015   Procedure: LEFT AND RIGHT HEART CATHETERIZATION WITH CORONARY ANGIOGRAM;  Surgeon:  Troy Sine, MD;     PERCUTANEOUS CORONARY STENT INTERVENTION (PCI-S) N/A 03/04/2015   Procedure: PERCUTANEOUS CORONARY STENT INTERVENTION (PCI-S);  Surgeon: Troy Sine, MD; RI 99>>0% w/   BMS, bifurcation CFX-OM 90>>40% w/ Angiosculpt PTCA; dCFX 90>>0% w/   2.7520 mm Rebel BMS   SHOULDER ARTHROSCOPY WITH ROTATOR CUFF REPAIR AND SUBACROMIAL DECOMPRESSION Left 11/19/2013   Procedure: LEFT SHOULDER ARTHROSCOPY DEBRIDEMENT EXTENTSIVE DISTAL CLAVICULECTOMY DECOMPRESSION PARTIAL ACROMIOPLASTY WITH CORACOACROMIAL WITH ROTATOR CUFF REPAIR ;  Surgeon: Renette Butters, MD;  Location: [place];  Service: Orthopedics;  Laterality: Left;    Current Medications: No outpatient medications have been marked as taking for the 08/22/22 encounter (Appointment) with Vickie Epley, MD.     Allergies:   Lisinopril and Iohexol   Social History   Socioeconomic History   Marital status: Married    Spouse name: Not on file   Number of children: Not on file   Years of education: Not on file   Highest education level: Not on file  Occupational History   Not on file  Tobacco Use   Smoking status: Former    Packs/day: 0.50    Years: 50.00    Total pack years: 25.00    Types: Cigarettes    Quit date: 03/01/2016    Years since quitting: 6.4   Smokeless tobacco: Never  Vaping Use   Vaping Use: Every day  Substance and  Sexual Activity   Alcohol use: Yes    Alcohol/week: 7.0 - 8.0 standard drinks of alcohol    Types: 6 Cans of beer, 1 - 2 Shots of liquor per week    Comment: couple shots of royal per day   Drug use: No   Sexual activity: Not Currently    Comment: cutting down 1/2 ppd  Other Topics Concern   Not on file  Social History Narrative   Lives with wife, does not use assist, drives.  No home health services.     Social Determinants of Health   Financial Resource Strain: Not on file  Food Insecurity: Not on file  Transportation Needs: Not on file  Physical  Activity: Not on file  Stress: Not on file  Social Connections: Not on file     Family History: The patient's family history includes Diabetes in his sister; Hypertension in his mother and sister; Prostate cancer in his father. There is no history of Stroke or Heart attack.  ROS:   Please see the history of present illness.    All other systems reviewed and are negative.  EKGs/Labs/Other Studies Reviewed:    The following studies were reviewed today:  February 23, 2022 ZIO No A-fib, 3.1% PVC burden, 8 SVT, longest lasting 16 beats    Recent Labs: 11/14/2021: BUN 15; Creatinine, Ser 1.11; Hemoglobin 16.9; Magnesium 2.3; Platelets 127; Potassium 3.8; Sodium 139  Recent Lipid Panel    Component Value Date/Time   CHOL 115 07/23/2018 0008   TRIG 103 07/23/2018 0008   HDL 33 (L) 07/23/2018 0008   CHOLHDL 3.5 07/23/2018 0008   VLDL 21 07/23/2018 0008   LDLCALC 61 07/23/2018 0008    Physical Exam:    VS:  There were no vitals taken for this visit.    Wt Readings from Last 3 Encounters:  03/02/22 215 lb 9.6 oz (97.8 kg)  12/21/21 215 lb 6.4 oz (97.7 kg)  03/08/21 217 lb (98.4 kg)     GEN: *** Well nourished, well developed in no acute distress HEENT: Normal NECK: No JVD; No carotid bruits LYMPHATICS: No lymphadenopathy CARDIAC: ***RRR, no murmurs, rubs, gallops RESPIRATORY:  Clear to auscultation without rales, wheezing or rhonchi  ABDOMEN: Soft, non-tender, non-distended MUSCULOSKELETAL:  No edema; No deformity  SKIN: Warm and dry NEUROLOGIC:  Alert and oriented x 3 PSYCHIATRIC:  Normal affect        ASSESSMENT:    1. PAF (paroxysmal atrial fibrillation) ([place])   2. Primary hypertension   3. Chronic diastolic congestive heart failure (HCC)    PLAN:    In order of problems listed above:   #Paroxysmal atrial fibrillation Low burden on his March ZIO monitor.  Takes Eliquis for stroke prophylaxis.  On Coreg and diltiazem.  #Hypertension *** goal today.   Recommend checking blood pressures 1-2 times per week at home and recording the values.  Recommend bringing these recordings to the primary care physician.  #Chronic diastolic heart failure         Total time spent with patient today *** minutes. This includes reviewing records, evaluating the patient and coordinating care.   Medication Adjustments/Labs and Tests Ordered: Current medicines are reviewed at length with the patient today.  Concerns regarding medicines are outlined above.  No orders of the defined types were placed in this encounter.  No orders of the defined types were placed in this encounter.    Signed, Lars Mage, MD, [facility], [facility] 08/21/2022 9:12 PM    Electrophysiology  [facility]

## 2022-08-22 ENCOUNTER — Encounter: Payer: Self-pay | Admitting: Cardiology

## 2022-08-22 ENCOUNTER — Ambulatory Visit: Payer: Medicare Other | Attending: Cardiology | Admitting: Cardiology

## 2022-08-22 VITALS — BP 122/74 | HR 55 | Ht 74.0 in | Wt 209.6 lb

## 2022-08-22 DIAGNOSIS — I10 Essential (primary) hypertension: Secondary | ICD-10-CM

## 2022-08-22 DIAGNOSIS — I480 Paroxysmal atrial fibrillation: Secondary | ICD-10-CM | POA: Diagnosis not present

## 2022-08-22 DIAGNOSIS — I5032 Chronic diastolic (congestive) heart failure: Secondary | ICD-10-CM | POA: Diagnosis not present

## 2022-08-22 NOTE — Progress Notes (Signed)
Electrophysiology Office Follow up Visit Note:    Date:  08/22/2022   ID:  Rodney [name], DOB [birth year], MRN [id number]  PCP:  Rodney Dy, MD  [facility] Cardiologist:  [name], MD  [place] Electrophysiologist:  Will Meredith Leeds, MD    Interval History:    Rodney [name] is a 81 y.o. male who presents for a follow up visit. They were last seen in clinic March 02, 2022 by [facility].  The patient has an extensive past medical history including coronary disease post PCI's, hypertension, hyperlipidemia, AAA post stent grafting in 2012, paroxysmal atrial fibrillation, ischemic cardiomyopathy with an ejection fraction of 20% in 2016 and now recovered on 2019 echo, mild obstructive sleep apnea.   At the appointment with Joseph Art he reported possible symptoms related to atrial fibrillation.  The patient has previously been ablated by Dr. [name] in 2018.  He previously used dofetilide and amiodarone but did not tolerate either.  He takes Eliquis for stroke prophylaxis.  Today, he reports that his arrhythmic episodes have been stable. Usually he feels when he goes into atrial fibrillation. His episodes are occurring maybe twice a week with a duration of several minutes.  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 Constance Holster. He will have a biopsy soon and notes that he will be under anesthesia.  He had been smoking since he was in his 75's. Lately he has not been smoking due to the concerns regarding his vocal chords.  For activity he enjoys playing golf.  He denies any chest pain, shortness of breath, or peripheral edema. No lightheadedness, headaches, syncope, orthopnea, or PND.      Past Medical History:  Diagnosis Date   AAA (abdominal aortic aneurysm) ([place])    stent graft put in 02/2009   Arthritis    CAD (coronary artery disease)    a. cath 2010 b. CAD s/p PCI to ramus and LCX on 03/04/15   Gout    Hyperlipidemia    Hypertension    OSA  (obstructive sleep apnea) 09/30/2015   Very mild with AHI 6.2/hr   Paroxysmal atrial fibrillation ([place])    Tobacco abuse     Past Surgical History:  Procedure Laterality Date   ABDOMINAL AORTIC ANEURYSM REPAIR  02/19/2009   performed by [place] N/A 04/28/2017   Procedure: Atrial Fibrillation Ablation;  Surgeon: Constance Haw, MD;  Location: [place] [facility];  Service: Cardiovascular;  Laterality: N/A;   COLONOSCOPY     KNEE ARTHROSCOPY  2003   left   LEFT AND RIGHT HEART CATHETERIZATION WITH CORONARY ANGIOGRAM N/A 03/03/2015   Procedure: LEFT AND RIGHT HEART CATHETERIZATION WITH CORONARY ANGIOGRAM;  Surgeon: Troy Sine, MD;     PERCUTANEOUS CORONARY STENT INTERVENTION (PCI-S) N/A 03/04/2015   Procedure: PERCUTANEOUS CORONARY STENT INTERVENTION (PCI-S);  Surgeon: Troy Sine, MD; RI 99>>0% w/   BMS, bifurcation CFX-OM 90>>40% w/ Angiosculpt PTCA; dCFX 90>>0% w/   2.7520 mm Rebel BMS   SHOULDER ARTHROSCOPY WITH ROTATOR CUFF REPAIR AND SUBACROMIAL DECOMPRESSION Left 11/19/2013   Procedure: LEFT SHOULDER ARTHROSCOPY DEBRIDEMENT EXTENTSIVE DISTAL CLAVICULECTOMY DECOMPRESSION PARTIAL ACROMIOPLASTY WITH CORACOACROMIAL WITH ROTATOR CUFF REPAIR ;  Surgeon: Renette Butters, MD;  Location: [facility];  Service: Orthopedics;  Laterality: Left;    Current Medications: Current Meds  Medication Sig   amLODipine (NORVASC) 5 MG tablet Take 1 tablet (5 mg total) daily by mouth.   apixaban (ELIQUIS)  5 MG TABS tablet Take 1 tablet (5 mg total) by mouth 2 (two) times daily.   aspirin EC 81 MG tablet Take 81 mg by mouth daily.   atorvastatin (LIPITOR) 80 MG tablet Take 1 tablet (80 mg total) by mouth daily.   carvedilol (COREG) 12.5 MG tablet Take 1 tablet (12.5 mg total) by mouth 2 (two) times daily with a meal.   diltiazem (CARDIZEM) 30 MG tablet TAKE 1 TABLET BY MOUTH FOUR TIMES DAILY AS NEEDED FOR IRREGULAR HEART RATE GREATER THAN 100 AND BLOOD  PRESSURE GREATER THAN 100   famotidine (PEPCID) 40 MG tablet Take 1 tablet (40 mg total) by mouth 2 (two) times daily.   fish oil-omega-3 fatty acids 1000 MG capsule Take 1,000 mg by mouth daily.    fluticasone (FLONASE) 50 MCG/ACT nasal spray Place 1 spray into both nostrils daily as needed for allergies.   furosemide (LASIX) 20 MG tablet Take 20 mg by mouth daily.   irbesartan (AVAPRO) 300 MG tablet TAKE 1/2 TABLET(150 MG) BY MOUTH DAILY   isosorbide mononitrate (IMDUR) 60 MG 24 hr tablet TAKE 1 TABLET BY MOUTH EVERY DAY ( CAN NOT BE FILL 06.04.2018)   KLOR-CON M10 10 MEQ tablet TAKE 1 TABLET BY MOUTH TWICE A DAY   loratadine (CLARITIN) 10 MG tablet Take 10 mg by mouth daily as needed (seasonal allergies).    Multiple Vitamin (MULTIVITAMIN WITH MINERALS) TABS tablet Take 1 tablet by mouth daily.   nitroGLYCERIN (NITROSTAT) 0.4 MG SL tablet DISSOLVE 1 TABLET UNDER THE TONGUE EVERY 5 MINUTES AS NEEDED FOR CHEST PAIN( SHORTNESS OF BREATH)     Allergies:   Lisinopril and Iohexol   Social History   Socioeconomic History   Marital status: Married    Spouse name: Not on file   Number of children: Not on file   Years of education: Not on file   Highest education level: Not on file  Occupational History   Not on file  Tobacco Use   Smoking status: Former    Packs/day: 0.50    Years: 50.00    Total pack years: 25.00    Types: Cigarettes    Quit date: 03/01/2016    Years since quitting: 6.4   Smokeless tobacco: Never  Vaping Use   Vaping Use: Every day  Substance and Sexual Activity   Alcohol use: Yes    Alcohol/week: 7.0 - 8.0 standard drinks of alcohol    Types: 6 Cans of beer, 1 - 2 Shots of liquor per week    Comment: couple shots of royal per day   Drug use: No   Sexual activity: Not Currently    Comment: cutting down 1/2 ppd  Other Topics Concern   Not on file  Social History Narrative   Lives with wife, does not use assist, drives.  No home health services.     Social  Determinants of Health   Financial Resource Strain: Not on file  Food Insecurity: Not on file  Transportation Needs: Not on file  Physical Activity: Not on file  Stress: Not on file  Social Connections: Not on file     Family History: The patient's family history includes Diabetes in his sister; Hypertension in his mother and sister; Prostate cancer in his father. There is no history of Stroke or Heart attack.  ROS:   Please see the history of present illness.    (+) Palpitations All other systems reviewed and are negative.  EKGs/Labs/Other Studies Reviewed:  The following studies were reviewed today:  February 23, 2022 ZIO No A-fib, 3.1% PVC burden, 8 SVT, longest lasting 16 beats   EKG:  EKG is personally reviewed.  08/22/2022:  EKG was not ordered.   Recent Labs: 11/14/2021: BUN 15; Creatinine, Ser 1.11; Hemoglobin 16.9; Magnesium 2.3; Platelets 127; Potassium 3.8; Sodium 139   Recent Lipid Panel    Component Value Date/Time   CHOL 115 07/23/2018 0008   TRIG 103 07/23/2018 0008   HDL 33 (L) 07/23/2018 0008   CHOLHDL 3.5 07/23/2018 0008   VLDL 21 07/23/2018 0008   LDLCALC 61 07/23/2018 0008    Physical Exam:    VS:  BP 122/74   Pulse (!) 55   Ht '6\' 2"'$  (1.88 m)   Wt 209 lb 9.6 oz (95.1 kg)   SpO2 96%   BMI 26.91 kg/m     Wt Readings from Last 3 Encounters:  08/22/22 209 lb 9.6 oz (95.1 kg)  03/02/22 215 lb 9.6 oz (97.8 kg)  12/21/21 215 lb 6.4 oz (97.7 kg)     GEN: Well nourished, well developed in no acute distress HEENT: Normal NECK: No JVD; No carotid bruits LYMPHATICS: No lymphadenopathy CARDIAC: RRR, no murmurs, rubs, gallops RESPIRATORY:  Clear to auscultation without rales, wheezing or rhonchi  ABDOMEN: Soft, non-tender, non-distended MUSCULOSKELETAL:  No edema; No deformity  SKIN: Warm and dry NEUROLOGIC:  Alert and oriented x 3 PSYCHIATRIC:  Normal affect        ASSESSMENT:    1. PAF (paroxysmal atrial fibrillation) ([place])   2.  Primary hypertension   3. Chronic diastolic congestive heart failure (HCC)    PLAN:    In order of problems listed above:  #Paroxysmal atrial fibrillation Low burden on his March ZIO monitor.  Takes Eliquis for stroke prophylaxis.  On Coreg and diltiazem.   #Hypertension At goal today.  Recommend checking blood pressures 1-2 times per week at home and recording the values.  Recommend bringing these recordings to the primary care physician.   #Chronic diastolic heart failure Euvolemic today.  #Preop clearance for biopsy The patient has no signs of heart failure or active ischemia.  He is fairly active and routinely plays golf.  He accomplishes greater than 4 METS routinely without any signs or symptoms of cardiovascular disease.  I think he is at an acceptable risk to undergo planned vocal cord mass biopsy.  It is okay for him to stop his Eliquis for 2 to 3 days prior to the biopsy if needed to reduce bleeding risk.  Continue his carvedilol in the perioperative period.  Follow-up in 1 year or sooner as needed.  APP appointment okay.   Medication Adjustments/Labs and Tests Ordered: Current medicines are reviewed at length with the patient today.  Concerns regarding medicines are outlined above.  No orders of the defined types were placed in this encounter.  No orders of the defined types were placed in this encounter.   I,Mathew Stumpf,acting as a Education administrator for Vickie Epley, MD.,have documented all relevant documentation on the behalf of Vickie Epley, MD,as directed by  Vickie Epley, MD while in the presence of Vickie Epley, MD.  I, Vickie Epley, MD, have reviewed all documentation for this visit. The documentation on 08/22/22 for the exam, diagnosis, procedures, and orders are all accurate and complete.   Signed, Lars Mage, MD, [facility], [facility] 08/22/2022 11:38 AM    Electrophysiology [facility]

## 2022-08-22 NOTE — Patient Instructions (Signed)
Medication Instructions:  none *If you need a refill on your cardiac medications before your next appointment, please call your pharmacy*   Lab Work: none If you have labs (blood work) drawn today and your tests are completely normal, you will receive your results only by: [place] (if you have MyChart) OR A paper copy in the mail If you have any lab test that is abnormal or we need to change your treatment, we will call you to review the results.   Testing/Procedures: none   Follow-Up: At [facility], you and your health needs are our priority.  As part of our continuing mission to provide you with exceptional heart care, we have created designated Provider Care Teams.  These Care Teams include your primary Cardiologist (physician) and Advanced Practice Providers (APPs -  Physician Assistants and Nurse Practitioners) who all work together to provide you with the care you need, when you need it.  We recommend signing up for the patient portal called "MyChart".  Sign up information is provided on this After Visit Summary.  MyChart is used to connect with patients for Virtual Visits (Telemedicine).  Patients are able to view lab/test results, encounter notes, upcoming appointments, etc.  Non-urgent messages can be sent to your provider as well.   To learn more about what you can do with MyChart, go to [email or web address].    Your next appointment:   1 year(s)  The format for your next appointment:   In Person  Provider:   Lars Mage, MD    Other Instructions None   Important Information About Sugar

## 2022-08-31 ENCOUNTER — Telehealth: Payer: Self-pay | Admitting: Internal Medicine

## 2022-08-31 NOTE — Telephone Encounter (Signed)
Medically cleared for procedure in Dr. Mardene Speak note 08/22/22, referenced Eliquis.  Need input from Dr. Debara Pickett re: holding aspirin. Per notes has history of CAD post PCIs, hypertension, hyperlipidemia, AAA post stent grafting in 2012, paroxysmal atrial fibrillation, ischemic cardiomyopathy with an ejection fraction of 20% in 2016 and now recovered on 2019 echo, mild obstructive sleep apnea, also possible hx of TIA listed in chart. It is listed that he had prior PCI to the ramus intermedius and distal circumflex arteries (02/2015), do not see more recent cath that this. Has not seen vasc since 2018. Given mutiple other indications for CAD beyond our periop dapt protocol to include possible prior TIA and AAA stent graft, will reach out to Dr. Debara Pickett to clarify whether patient can hold ASA for vocal cord surgery. Dr. Debara Pickett - Please route response to P CV DIV PREOP (the pre-op pool). Thank you.  Will also clarify with pharm optimal duration to hold Eliquis.

## 2022-08-31 NOTE — Telephone Encounter (Signed)
Patient with diagnosis of atrial fibrillaton on Eliquis for anticoagulation.    Procedure: microlaryngoscopy with biopsy of vocal cord Date of procedure: 09/14/22   CHA2DS2-VASc Score = 7   This indicates a 11.2% annual risk of stroke. The patient's score is based upon: CHF History: 1 HTN History: 1 Diabetes History: 0 Stroke History: 2 Vascular Disease History: 1 Age Score: 2 Gender Score: 0   TIA 2016 with no residual issues, MRI negative for acute infarcts  CrCl 71 Platelet count 127  Per office protocol, patient can hold Eliquis for 2 days prior to procedure.   Patient will not need bridging with Lovenox (enoxaparin) around procedure.  **This guidance is not considered finalized until pre-operative APP has relayed final recommendations.**

## 2022-08-31 NOTE — Telephone Encounter (Signed)
   Pre-operative Risk Assessment    Patient Name: Rodney [name]  DOB: May 21, 1941 MRN: [id number]      Request for Surgical Clearance    Procedure:  microlaryngoscopy with biopsy of a vocal cord  Date of Surgery:  Clearance 09/14/22                                 Surgeon:  Dr. Drue Novel Group or Practice Name:  [facility] Phone number:  [phone]  Fax number:  [phone]   Type of Clearance Requested:   - Medical  - Pharmacy:  Hold blood thinners, leaving up to cardiology   Type of Anesthesia:  General    Additional requests/questions:    SignedAnnabell Sabal   08/31/2022, 11:27 AM

## 2022-09-01 NOTE — Telephone Encounter (Signed)
   Patient Name: Rodney [name]  DOB: 07-22-1941 MRN: [id number]  Primary Cardiologist: [name], MD  Chart reviewed as part of pre-operative protocol coverage. Pre-op clearance already addressed by colleagues in earlier phone notes. To summarize recommendations:  -Medically cleared for procedure and Dr. Mardene Speak note from 08/22/2022. -Per our pharmacy team can hold Eliquis x2 days prior to the procedure.  The patient will not need bridging with Lovenox.  Please resume when medically safe to do so. -Per Dr. Debara Pickett okay to hold aspirin x7 days prior to the procedure.  Restart after.  Will route this bundled recommendation to requesting provider via Epic fax function and remove from pre-op pool. Please call with questions.  Elgie Collard, PA-C 09/01/2022, 3:43 PM

## 2022-09-01 NOTE — Telephone Encounter (Signed)
Ok to hold Aspirin 7 days prior to surgery - restart after. Already advised ok to hold Eliquis per pharmacy recs  Dr Lemmie Evens

## 2022-09-13 ENCOUNTER — Other Ambulatory Visit: Payer: Self-pay

## 2022-09-13 ENCOUNTER — Encounter (HOSPITAL_COMMUNITY): Payer: Self-pay | Admitting: Otolaryngology

## 2022-09-13 NOTE — H&P (Signed)
  HPI:   Rodney [name] is a 81 y.o. male who presents as a consult Patient.   Referring Provider: Pcp, Verify Patient Has  Chief complaint: Laryngeal mass.  HPI: History of progressive hoarseness for about 1 or 2 years, he is not quite clear on that. He is a chronic smoker and drinks alcohol on a daily basis. He saw somebody at the [place] and was referred here for continued treatment. He had a CT scan yesterday. These results are not available to me currently.  PMH/Meds/All/SocHx/FamHx/ROS:   History reviewed. No pertinent past medical history.  History reviewed. No pertinent surgical history.  No family history of bleeding disorders, wound healing problems or difficulty with anesthesia.   Social History   Socioeconomic History   Marital status: Single  Spouse name: Not on file   Number of children: Not on file   Years of education: Not on file   Highest education level: Not on file  Occupational History   Not on file  Tobacco Use   Smoking status: Every Day  Types: Cigarettes   Smokeless tobacco: Never  Substance and Sexual Activity   Alcohol use: Yes   Drug use: Not on file   Sexual activity: Not on file  Other Topics Concern   Not on file  Social History Narrative   Not on file   Social Determinants of Health   Financial Resource Strain: Not on file  Food Insecurity: Not on file  Transportation Needs: Not on file  Physical Activity: Not on file  Stress: Not on file  Social Connections: Not on file  Housing Stability: Not on file   No current outpatient medications on file.  A complete ROS was performed with pertinent positives/negatives noted in the HPI. The remainder of the ROS are negative.   Physical Exam:   There were no vitals taken for this visit.  General: Healthy and alert, in no distress, breathing easily. Voice is harsh and rough. Normal affect. In a pleasant mood. Head: Normocephalic, atraumatic. No masses, or scars. Eyes: Pupils are equal, and  reactive to light. Vision is grossly intact. No spontaneous or gaze nystagmus. Ears: Ear canals are clear. Tympanic membranes are intact, with normal landmarks and the middle ears are clear and healthy. Hearing: Grossly normal. Nose: Nasal cavities are clear with healthy mucosa, no polyps or exudate. Airways are patent. Face: No masses or scars, facial nerve function is symmetric. Oral Cavity: No mucosal abnormalities are noted. Tongue with normal mobility. Dentition appears healthy. Oropharynx: Tonsils are symmetric. There are no mucosal masses identified. Tongue base appears normal and healthy. Larynx/Hypopharynx: Indirect exam reveals a left vocal cord beefy red mass. Cord moves well. No other lesions identified. Chest: Deferred Neck: No palpable masses, no cervical adenopathy, no thyroid nodules or enlargement. Neuro: Cranial nerves II-XII with normal function. Balance: Normal gate. Other findings: none.  Independent Review of Additional Tests or Records:  none  Procedures:  none  Impression & Plans:  Left glottic mass. I will review the CT scan. He is going to need microlaryngoscopy with biopsy. Is very important that he cut back or quit smoking and drinking. He seems to understand. We will need cardiac clearance as well. He has a history of stents. He is going to see his cardiologist on Monday.

## 2022-09-13 NOTE — Progress Notes (Signed)
Mr. Rodney [name] denies chest pain or shortness of breath.  Patient reports that the last time he took NTG was when he was in the hospital in 2018.  Mr.Rodney [name] denies having any s/s of Covid in his household, also denies any known exposure to Covid.   Mr. Rodney [name] PCP is Dr. Rogers Seeds; cardiologist is Dr.K Debara Pickett. Mr. Rodney [name] stopped   ASA (09/07/22) and Eliquis  (09/11/22) per cardiologist office instructions.

## 2022-09-14 ENCOUNTER — Ambulatory Visit (HOSPITAL_COMMUNITY): Payer: No Typology Code available for payment source | Admitting: Certified Registered Nurse Anesthetist

## 2022-09-14 ENCOUNTER — Encounter (HOSPITAL_COMMUNITY): Payer: Self-pay | Admitting: Otolaryngology

## 2022-09-14 ENCOUNTER — Encounter (HOSPITAL_COMMUNITY)
Admission: RE | Disposition: A | Discharge status: Home / Self Care | Payer: Self-pay | Source: Home / Self Care | Attending: Otolaryngology

## 2022-09-14 ENCOUNTER — Other Ambulatory Visit: Payer: Self-pay

## 2022-09-14 ENCOUNTER — Ambulatory Visit (HOSPITAL_COMMUNITY)
Admission: RE | Admit: 2022-09-14 | Discharge: 2022-09-14 | Disposition: A | Discharge status: Home / Self Care | Payer: No Typology Code available for payment source | Attending: Otolaryngology | Admitting: Otolaryngology

## 2022-09-14 ENCOUNTER — Ambulatory Visit (HOSPITAL_BASED_OUTPATIENT_CLINIC_OR_DEPARTMENT_OTHER): Payer: No Typology Code available for payment source | Admitting: Certified Registered Nurse Anesthetist

## 2022-09-14 DIAGNOSIS — I2510 Atherosclerotic heart disease of native coronary artery without angina pectoris: Secondary | ICD-10-CM | POA: Diagnosis not present

## 2022-09-14 DIAGNOSIS — J387 Other diseases of larynx: Secondary | ICD-10-CM | POA: Diagnosis not present

## 2022-09-14 DIAGNOSIS — C320 Malignant neoplasm of glottis: Secondary | ICD-10-CM | POA: Diagnosis not present

## 2022-09-14 DIAGNOSIS — Z1152 Encounter for screening for COVID-19: Secondary | ICD-10-CM | POA: Diagnosis not present

## 2022-09-14 DIAGNOSIS — Z87891 Personal history of nicotine dependence: Secondary | ICD-10-CM | POA: Diagnosis not present

## 2022-09-14 DIAGNOSIS — G4730 Sleep apnea, unspecified: Secondary | ICD-10-CM | POA: Insufficient documentation

## 2022-09-14 DIAGNOSIS — I10 Essential (primary) hypertension: Secondary | ICD-10-CM

## 2022-09-14 DIAGNOSIS — F17210 Nicotine dependence, cigarettes, uncomplicated: Secondary | ICD-10-CM | POA: Diagnosis not present

## 2022-09-14 DIAGNOSIS — Z955 Presence of coronary angioplasty implant and graft: Secondary | ICD-10-CM | POA: Diagnosis not present

## 2022-09-14 HISTORY — PX: MICROLARYNGOSCOPY: SHX5208

## 2022-09-14 HISTORY — DX: Cardiac arrhythmia, unspecified: I49.9

## 2022-09-14 LAB — CBC
HCT: 45.9 % (ref 39.0–52.0)
Hemoglobin: 15.9 g/dL (ref 13.0–17.0)
MCH: 34.1 pg — ABNORMAL HIGH (ref 26.0–34.0)
MCHC: 34.6 g/dL (ref 30.0–36.0)
MCV: 98.5 fL (ref 80.0–100.0)
Platelets: 104 10*3/uL — ABNORMAL LOW (ref 150–400)
RBC: 4.66 MIL/uL (ref 4.22–5.81)
RDW: 13.2 % (ref 11.5–15.5)
WBC: 4.2 10*3/uL (ref 4.0–10.5)
nRBC: 0 % (ref 0.0–0.2)

## 2022-09-14 LAB — COMPREHENSIVE METABOLIC PANEL
ALT: 39 U/L (ref 0–44)
AST: 36 U/L (ref 15–41)
Albumin: 3.8 g/dL (ref 3.5–5.0)
Alkaline Phosphatase: 73 U/L (ref 38–126)
Anion gap: 9 (ref 5–15)
BUN: 14 mg/dL (ref 8–23)
CO2: 23 mmol/L (ref 22–32)
Calcium: 8.7 mg/dL — ABNORMAL LOW (ref 8.9–10.3)
Chloride: 106 mmol/L (ref 98–111)
Creatinine, Ser: 1.23 mg/dL (ref 0.61–1.24)
GFR, Estimated: 59 mL/min — ABNORMAL LOW (ref >60)
Glucose, Bld: 100 mg/dL — ABNORMAL HIGH (ref 70–99)
Potassium: 3.6 mmol/L (ref 3.5–5.1)
Sodium: 138 mmol/L (ref 135–145)
Total Bilirubin: 0.9 mg/dL (ref 0.3–1.2)
Total Protein: 7 g/dL (ref 6.5–8.1)

## 2022-09-14 LAB — SARS CORONAVIRUS 2 BY RT PCR: SARS Coronavirus 2 by RT PCR: NEGATIVE

## 2022-09-14 SURGERY — MICROLARYNGOSCOPY
Anesthesia: General | Site: Throat

## 2022-09-14 MED ORDER — FENTANYL CITRATE (PF) 250 MCG/5ML IJ SOLN
INTRAMUSCULAR | Status: AC
  Filled 2022-09-14: qty 5

## 2022-09-14 MED ORDER — LIDOCAINE 2% (20 MG/ML) 5 ML SYRINGE
INTRAMUSCULAR | Status: DC | PRN
  Administered 2022-09-14: 100 mg via INTRAVENOUS

## 2022-09-14 MED ORDER — OXYCODONE HCL 5 MG/5ML PO SOLN: 5.0000 mg | Freq: Once | ORAL | Status: DC | PRN

## 2022-09-14 MED ORDER — SUGAMMADEX SODIUM 200 MG/2ML IV SOLN
INTRAVENOUS | Status: DC | PRN
  Administered 2022-09-14 (×4): 100 mg via INTRAVENOUS

## 2022-09-14 MED ORDER — PROPOFOL 10 MG/ML IV BOLUS
INTRAVENOUS | Status: DC | PRN
  Administered 2022-09-14: 110 mg via INTRAVENOUS

## 2022-09-14 MED ORDER — DEXAMETHASONE SODIUM PHOSPHATE 10 MG/ML IJ SOLN
INTRAMUSCULAR | Status: AC
  Filled 2022-09-14: qty 1

## 2022-09-14 MED ORDER — FENTANYL CITRATE (PF) 250 MCG/5ML IJ SOLN
INTRAMUSCULAR | Status: DC | PRN
  Administered 2022-09-14 (×2): 25 ug via INTRAVENOUS

## 2022-09-14 MED ORDER — OXYCODONE HCL 5 MG PO TABS: 5.0000 mg | ORAL_TABLET | Freq: Once | ORAL | Status: DC | PRN

## 2022-09-14 MED ORDER — CHLORHEXIDINE GLUCONATE 0.12 % MT SOLN
OROMUCOSAL | Status: AC
  Administered 2022-09-14: 15 mL via OROMUCOSAL
  Filled 2022-09-14: qty 15

## 2022-09-14 MED ORDER — EPINEPHRINE HCL (NASAL) 0.1 % NA SOLN
NASAL | Status: DC | PRN
  Administered 2022-09-14: 30 mL via NASAL

## 2022-09-14 MED ORDER — SUCCINYLCHOLINE CHLORIDE 200 MG/10ML IV SOSY
PREFILLED_SYRINGE | INTRAVENOUS | Status: DC | PRN
  Administered 2022-09-14: 140 mg via INTRAVENOUS

## 2022-09-14 MED ORDER — FENTANYL CITRATE (PF) 100 MCG/2ML IJ SOLN: 25.0000 ug | INTRAMUSCULAR | Status: DC | PRN

## 2022-09-14 MED ORDER — ROCURONIUM BROMIDE 10 MG/ML (PF) SYRINGE
PREFILLED_SYRINGE | INTRAVENOUS | Status: DC | PRN
  Administered 2022-09-14: 40 mg via INTRAVENOUS

## 2022-09-14 MED ORDER — LACTATED RINGERS IV SOLN: INTRAVENOUS | Status: DC

## 2022-09-14 MED ORDER — ONDANSETRON HCL 4 MG/2ML IJ SOLN
INTRAMUSCULAR | Status: DC | PRN
  Administered 2022-09-14: 4 mg via INTRAVENOUS

## 2022-09-14 MED ORDER — PROPOFOL 10 MG/ML IV BOLUS
INTRAVENOUS | Status: AC
  Filled 2022-09-14: qty 20

## 2022-09-14 MED ORDER — ONDANSETRON HCL 4 MG/2ML IJ SOLN
INTRAMUSCULAR | Status: AC
  Filled 2022-09-14: qty 2

## 2022-09-14 MED ORDER — ONDANSETRON HCL 4 MG/2ML IJ SOLN: 4.0000 mg | Freq: Once | INTRAMUSCULAR | Status: DC | PRN

## 2022-09-14 MED ORDER — LIDOCAINE-EPINEPHRINE 1 %-1:100000 IJ SOLN
INTRAMUSCULAR | Status: AC
  Filled 2022-09-14: qty 1

## 2022-09-14 MED ORDER — LIDOCAINE-EPINEPHRINE 1 %-1:100000 IJ SOLN
INTRAMUSCULAR | Status: DC | PRN
  Administered 2022-09-14: .3 mL

## 2022-09-14 MED ORDER — TRIAMCINOLONE ACETONIDE 40 MG/ML IJ SUSP
INTRAMUSCULAR | Status: DC | PRN
  Administered 2022-09-14: 40 mg

## 2022-09-14 MED ORDER — DEXAMETHASONE SODIUM PHOSPHATE 10 MG/ML IJ SOLN
INTRAMUSCULAR | Status: DC | PRN
  Administered 2022-09-14: 8 mg via INTRAVENOUS

## 2022-09-14 MED ORDER — CHLORHEXIDINE GLUCONATE 0.12 % MT SOLN: 15.0000 mL | Freq: Once | OROMUCOSAL | Status: AC

## 2022-09-14 MED ORDER — ACETAMINOPHEN 10 MG/ML IV SOLN: 1000.0000 mg | Freq: Once | INTRAVENOUS | Status: DC | PRN

## 2022-09-14 MED ORDER — EPINEPHRINE HCL (NASAL) 0.1 % NA SOLN
NASAL | Status: AC
  Filled 2022-09-14: qty 30

## 2022-09-14 MED ORDER — ORAL CARE MOUTH RINSE: 15.0000 mL | Freq: Once | OROMUCOSAL | Status: AC

## 2022-09-14 MED ORDER — 0.9 % SODIUM CHLORIDE (POUR BTL) OPTIME
TOPICAL | Status: DC | PRN
  Administered 2022-09-14: 1000 mL

## 2022-09-14 MED ORDER — TRIAMCINOLONE ACETONIDE 40 MG/ML IJ SUSP
INTRAMUSCULAR | Status: AC
  Filled 2022-09-14: qty 5

## 2022-09-14 SURGICAL SUPPLY — 33 items
BAG COUNTER SPONGE SURGICOUNT (BAG) ×1 IMPLANT
BNDG EYE OVAL (GAUZE/BANDAGES/DRESSINGS) ×1 IMPLANT
CANISTER SUCT 3000ML PPV (MISCELLANEOUS) ×1 IMPLANT
CNTNR URN SCR LID CUP LEK RST (MISCELLANEOUS) ×2 IMPLANT
CONT SPEC 4OZ STRL OR WHT (MISCELLANEOUS) ×1
COVER BACK TABLE 60X90IN (DRAPES) ×1 IMPLANT
COVER MAYO STAND STRL (DRAPES) ×1 IMPLANT
DRAPE HALF SHEET 40X57 (DRAPES) ×1 IMPLANT
DRSG TELFA 3X8 NADH STRL (GAUZE/BANDAGES/DRESSINGS) ×1 IMPLANT
ELECT REM PT RETURN 9FT ADLT (ELECTROSURGICAL)
ELECTRODE REM PT RTRN 9FT ADLT (ELECTROSURGICAL) IMPLANT
GAUZE 4X4 16PLY ~~LOC~~+RFID DBL (SPONGE) ×1 IMPLANT
GLOVE ECLIPSE 7.5 STRL STRAW (GLOVE) ×1 IMPLANT
GUARD TEETH (MISCELLANEOUS) IMPLANT
KIT BASIN OR (CUSTOM PROCEDURE TRAY) ×1 IMPLANT
KIT TURNOVER KIT B (KITS) ×1 IMPLANT
NDL PRECISIONGLIDE 27X1.5 (NEEDLE) IMPLANT
NDL SPNL 25GX3.5 QUINCKE BL (NEEDLE) ×1 IMPLANT
NEEDLE PRECISIONGLIDE 27X1.5 (NEEDLE) IMPLANT
NEEDLE SPNL 25GX3.5 QUINCKE BL (NEEDLE) ×1 IMPLANT
NS IRRIG 1000ML POUR BTL (IV SOLUTION) ×1 IMPLANT
PAD ARMBOARD 7.5X6 YLW CONV (MISCELLANEOUS) ×2 IMPLANT
PATTIES SURGICAL .5 X1 (DISPOSABLE) IMPLANT
PATTIES SURGICAL .5 X3 (DISPOSABLE) IMPLANT
SOL ANTI FOG 6CC (MISCELLANEOUS) ×1 IMPLANT
SOLUTION ANTI FOG 6CC (MISCELLANEOUS) ×1
SPECIMEN JAR SMALL (MISCELLANEOUS) ×1 IMPLANT
SYR 3ML LL SCALE MARK (SYRINGE) IMPLANT
SYR CONTROL 10ML LL (SYRINGE) IMPLANT
SYR TB 1ML LUER SLIP (SYRINGE) IMPLANT
TOWEL GREEN STERILE FF (TOWEL DISPOSABLE) ×1 IMPLANT
TUBE CONNECTING 12X1/4 (SUCTIONS) ×1 IMPLANT
WATER STERILE IRR 1000ML POUR (IV SOLUTION) ×1 IMPLANT

## 2022-09-14 NOTE — Op Note (Addendum)
OPERATIVE REPORT  DATE OF SURGERY: 09/14/2022  PATIENT:  Rodney [name],  81 y.o. male  PRE-OPERATIVE DIAGNOSIS:  Laryngeal mass, left  POST-OPERATIVE DIAGNOSIS:  Laryngeal mass, left  PROCEDURE:  Procedure(s): MICROLARYNGOSCOPY WITH VOCAL CORD BIOPSY  SURGEON:  Beckie Salts, MD  ASSISTANTS: none  ANESTHESIA:   General   EBL: 5 ml  DRAINS: none  LOCAL MEDICATIONS USED: 1% Xylocaine with epinephrine  SPECIMEN: Left glottic mass  COUNTS:  Correct  PROCEDURE DETAILS: The patient was taken to the operating room and placed on the operating table in the supine position. Following induction of general endotracheal anesthesia, the table was turned 90 and the patient was draped in a standard fashion.  The Jako laryngoscope was entered into the oral cavity used to visualize the larynx.  A broad telescope was used to take photographs of the lesion.  The operating microscope was used for the remainder of the procedure.  The larynx was normal except for the following: There is a partially exophytic mass involving the left true cord extending superiorly to the floor of the ventricle and inferiorly to the inferior aspect of the cord.  The subglottic larynx was not involved.  The lesion extended from the midportion of the left cord anteriorly to the anterior commissure and there did not appear to be involvement of the anterior commissure but no involvement of the right cord.  The cord on the left was infiltrated with local anesthetic solution.  Multiple biopsies were taken from this lesion.  It did appear to be invasive into the vocal ligament.  Topical adrenaline was applied on a large patty for hemostasis.  Specimen was sent for pathologic evaluation.  The endoscope was removed.  Patient was then awakened extubated and transferred to recovery in stable condition.    PATIENT DISPOSITION:  To PACU, stable

## 2022-09-14 NOTE — Interval H&P Note (Signed)
History and Physical Interval Note:  09/14/2022 8:46 AM  Rodney [name]  has presented today for surgery, with the diagnosis of Laryngeal mass.  The various methods of treatment have been discussed with the patient and family. After consideration of risks, benefits and other options for treatment, the patient has consented to  Procedure(s): MICROLARYNGOSCOPY WITH VOCAL CORD BIOPSY (N/A) as a surgical intervention.  The patient's history has been reviewed, patient examined, no change in status, stable for surgery.  I have reviewed the patient's chart and labs.  Questions were answered to the patient's satisfaction.     Rodney [name]

## 2022-09-14 NOTE — Anesthesia Preprocedure Evaluation (Signed)
Anesthesia Evaluation  Patient identified by MRN, date of birth, ID band Patient awake    Reviewed: Allergy & Precautions, H&P , NPO status , Patient's Chart, lab work & pertinent test results  Airway Mallampati: II  TM Distance: >3 FB Neck ROM: Full    Dental no notable dental hx.    Pulmonary sleep apnea , Patient abstained from smoking., former smoker   Pulmonary exam normal breath sounds clear to auscultation       Cardiovascular hypertension, + CAD  Normal cardiovascular exam+ dysrhythmias  Rhythm:Regular Rate:Normal     Neuro/Psych negative neurological ROS  negative psych ROS   GI/Hepatic negative GI ROS, Neg liver ROS,,,  Endo/Other  negative endocrine ROS    Renal/GU negative Renal ROS  negative genitourinary   Musculoskeletal negative musculoskeletal ROS (+)    Abdominal   Peds negative pediatric ROS (+)  Hematology negative hematology ROS (+)   Anesthesia Other Findings   Reproductive/Obstetrics negative OB ROS                             Anesthesia Physical Anesthesia Plan  ASA: 3  Anesthesia Plan: General   Post-op Pain Management: Minimal or no pain anticipated   Induction: Intravenous  PONV Risk Score and Plan: 2 and Ondansetron and Treatment may vary due to age or medical condition  Airway Management Planned: Oral ETT and Video Laryngoscope Planned  Additional Equipment:   Intra-op Plan:   Post-operative Plan: Extubation in OR  Informed Consent: I have reviewed the patients History and Physical, chart, labs and discussed the procedure including the risks, benefits and alternatives for the proposed anesthesia with the patient or authorized representative who has indicated his/her understanding and acceptance.     Dental advisory given  Plan Discussed with: CRNA and Surgeon  Anesthesia Plan Comments:        Anesthesia Quick Evaluation

## 2022-09-14 NOTE — Transfer of Care (Signed)
Immediate Anesthesia Transfer of Care Note  Patient: Rodney [name]  Procedure(s) Performed: MICROLARYNGOSCOPY WITH VOCAL CORD BIOPSY (Throat)  Patient Location: PACU  Anesthesia Type:General  Level of Consciousness: awake and patient cooperative  Airway & Oxygen Therapy: Patient Spontanous Breathing and Patient connected to face mask oxygen  Post-op Assessment: Report given to RN and Post -op Vital signs reviewed and stable  Post vital signs: Reviewed and stable  Last Vitals:  Vitals Value Taken Time  BP 130/72 09/14/22 0948  Temp    Pulse 57 09/14/22 0952  Resp 19 09/14/22 0952  SpO2 100 % 09/14/22 0952  Vitals shown include unvalidated device data.  Last Pain:  Vitals:   09/14/22 0717  TempSrc:   PainSc: 0-No pain         Complications: No notable events documented.

## 2022-09-14 NOTE — Discharge Instructions (Signed)
Resume normal diet as tolerated.  Resume normal activities as tolerated.  Tylenol/Motrin as needed for pain.

## 2022-09-14 NOTE — Progress Notes (Signed)
HR 56 on admission, BP 136/71. Pt did not take AM dose of coreg. Per Dr. Kalman Shan, do not administer.   Rodney [name]

## 2022-09-14 NOTE — Anesthesia Procedure Notes (Signed)
Procedure Name: Intubation Date/Time: 09/14/2022 9:10 AM  Performed by: Janene Harvey, CRNAPre-anesthesia Checklist: Patient identified, Emergency Drugs available, Suction available and Patient being monitored Patient Re-evaluated:Patient Re-evaluated prior to induction Oxygen Delivery Method: Circle system utilized Preoxygenation: Pre-oxygenation with 100% oxygen Induction Type: IV induction and Rapid sequence Ventilation: Mask ventilation without difficulty Laryngoscope Size: Mac and 4 Grade View: Grade I Tube type: Oral Tube size: 6.0 mm Number of attempts: 1 Airway Equipment and Method: Stylet and Oral airway Placement Confirmation: ETT inserted through vocal cords under direct vision, positive ETCO2 and breath sounds checked- equal and bilateral Secured at: 22 cm Tube secured with: Tape Dental Injury: Teeth and Oropharynx as per pre-operative assessment

## 2022-09-15 ENCOUNTER — Encounter (HOSPITAL_COMMUNITY): Payer: Self-pay | Admitting: Otolaryngology

## 2022-09-15 LAB — SURGICAL PATHOLOGY

## 2022-09-15 NOTE — Anesthesia Postprocedure Evaluation (Signed)
Anesthesia Post Note  Patient: Rodney [name]  Procedure(s) Performed: MICROLARYNGOSCOPY WITH VOCAL CORD BIOPSY (Throat)     Patient location during evaluation: PACU Anesthesia Type: General Level of consciousness: awake and alert Pain management: pain level controlled Vital Signs Assessment: post-procedure vital signs reviewed and stable Respiratory status: spontaneous breathing, nonlabored ventilation, respiratory function stable and patient connected to nasal cannula oxygen Cardiovascular status: blood pressure returned to baseline and stable Postop Assessment: no apparent nausea or vomiting Anesthetic complications: no   No notable events documented.  Last Vitals:  Vitals:   09/14/22 0950 09/14/22 1005  BP: 130/72 113/62  Pulse: (!) 58 (!) 59  Resp: (!) 23 16  Temp: 36.8 C 36.6 C  SpO2: 100% 99%    Last Pain:  Vitals:   09/14/22 1005  TempSrc:   PainSc: 0-No pain                 Servando Kyllonen S

## 2022-10-11 ENCOUNTER — Other Ambulatory Visit: Payer: Self-pay | Admitting: Radiation Oncology

## 2022-10-11 ENCOUNTER — Ambulatory Visit
Admission: RE | Admit: 2022-10-11 | Discharge: 2022-10-11 | Disposition: A | Discharge status: Home / Self Care | Payer: Self-pay | Source: Ambulatory Visit | Attending: Radiation Oncology | Admitting: Radiation Oncology

## 2022-10-11 DIAGNOSIS — C329 Malignant neoplasm of larynx, unspecified: Secondary | ICD-10-CM

## 2022-10-11 NOTE — Progress Notes (Signed)
Head and Neck Cancer Location of Tumor / Histology:  Laryngeal mass.  Biopsies revealed: 09-14-22   VOCAL CORD, LEFT, BIOPSY:  - Invasive squamous cell carcinoma, moderately differentiated   Comment: This case was reviewed with Dr. Vic Ripper who agrees with the  above interpretation    GROSS DESCRIPTION:   Received fresh are multiple fragments of pink-tan soft tissue measuring  1.2 x 0.9 x 0.2 cm in aggregate.  The specimen is entirely submitted in  1 block.  (KW, 09/14/2022)   Nutrition Status Yes No Comments  Weight changes? '[]'$  '[x]'$    Swallowing concerns? '[]'$  '[x]'$    PEG? '[]'$  '[x]'$     Referrals Yes No Comments  Social Work? '[]'$  '[x]'$    Dentistry? '[]'$  '[x]'$    Swallowing therapy? '[]'$  '[x]'$    Nutrition? '[]'$  '[x]'$    Med/Onc? '[]'$  '[x]'$     Safety Issues Yes No Comments  Prior radiation? '[]'$  '[x]'$    Pacemaker/ICD? '[]'$  '[x]'$    Possible current pregnancy? '[]'$  '[x]'$    Is the patient on methotrexate? '[]'$  '[x]'$     Tobacco/Marijuana/Snuff/ETOH use: smoker but started using patch to quit.  Past/Anticipated interventions by otolaryngology, if any:  Impression & Plans:  Left glottic mass. I will review the CT scan. He is going to need microlaryngoscopy with biopsy. Is very important that he cut back or quit smoking and drinking. He seems to understand. We will need cardiac clearance as well. He has a history of stents. He is going to see his cardiologist on Monday.   Past/Anticipated interventions by medical oncology, if any: not yet seen by medical oncology.   Saw Dr. Constance Holster two weeks ago. Voice progressively getting more hoarse.      Current Complaints / other details:  Question about voice quality and when it will improved with treatment. Also condition of mass.   Vitals:   10/12/22 0925  BP: 135/75  Pulse: (!) 58  Resp: 18  Temp: (!) 97.5 F (36.4 C)  SpO2: 98%

## 2022-10-11 NOTE — Progress Notes (Signed)
Radiation Oncology         [phone] ________________________________  Initial Outpatient Consultation  Name: Rodney [name] MRN: [id number]  Date: 10/12/2022  DOB: [birth year]  UR:[id number], Jori Moll, MD  Izora Gala, MD   REFERRING PHYSICIAN: Izora Gala, MD  DIAGNOSIS:    ICD-10-CM   1. 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 male who presented to his PCP at the [place] with past September with progressive hoarseness over the course of 1-2 years.   Soft tissue neck CT without contrast on 08/18/22 showed an irregularity of the anterior aspect of the left vocal cord with apparent extension to the left anterior hypoglottis. However, examination was limited due to motion artifact. Chest CT also performed on that date showed emphysematous changes of the lungs with a few nonspecific bilateral lung nodules (measuring around 2-4 mm). CT also showed nonspecific borderline enlarged right lower paratracheal and AP window lymph nodes measuring up to 1 cm.  Subsequently, the patient was referred to Dr. Constance Holster on 08/19/22 for further evaluation. Indirect exam of the larynx performed during this visit revealed a left vocal cord mass. No palpable cervical adenopathy or masses were appreciated.  Given this findings, Dr. Constance Holster recommended proceeding with microlaryngoscopy and biopsies of the laryngeal mass.   (The patient met with cardiology for pre-op clearance given his history of stents).  Biopsies of the left vocal cord mass on 09/14/22 collected by Dr. Constance Holster revealed invasive moderately differentiated squamous cell carcinoma   Swallowing issues, if any: none  Weight Changes: ***  Pain status: none  Other symptoms: hoarseness   Tobacco history, if any: Everyday smoker   ETOH abuse, if any: Drinks on a daily basis  Prior cancers, if any: none  PREVIOUS RADIATION  THERAPY: No  PAST MEDICAL HISTORY:  has a past medical history of AAA (abdominal aortic aneurysm) ([place]), Arthritis, CAD (coronary artery disease), Dysrhythmia, Gout, Hyperlipidemia, Hypertension, Paroxysmal atrial fibrillation ([place]), and Tobacco abuse.    PAST SURGICAL HISTORY: Past Surgical History:  Procedure Laterality Date   ABDOMINAL AORTIC ANEURYSM REPAIR  02/19/2009   performed by [place] N/A 04/28/2017   Procedure: Atrial Fibrillation Ablation;  Surgeon: Constance Haw, MD;  Location: [facility];  Service: Cardiovascular;  Laterality: N/A;   COLONOSCOPY     KNEE ARTHROSCOPY  2003   left   LEFT AND RIGHT HEART CATHETERIZATION WITH CORONARY ANGIOGRAM N/A 03/03/2015   Procedure: LEFT AND RIGHT HEART CATHETERIZATION WITH CORONARY ANGIOGRAM;  Surgeon: Troy Sine, MD;     MICROLARYNGOSCOPY N/A 09/14/2022   Procedure: MICROLARYNGOSCOPY WITH VOCAL CORD BIOPSY;  Surgeon: Izora Gala, MD;  Location: [facility];  Service: ENT;  Laterality: N/A;   PERCUTANEOUS CORONARY STENT INTERVENTION (PCI-S) N/A 03/04/2015   Procedure: PERCUTANEOUS CORONARY STENT INTERVENTION (PCI-S);  Surgeon: Troy Sine, MD; RI 99>>0% w/   BMS, bifurcation CFX-OM 90>>40% w/ Angiosculpt PTCA; dCFX 90>>0% w/   2.7520 mm Rebel BMS   SHOULDER ARTHROSCOPY WITH ROTATOR CUFF REPAIR AND SUBACROMIAL DECOMPRESSION Left 11/19/2013   Procedure: LEFT SHOULDER ARTHROSCOPY DEBRIDEMENT EXTENTSIVE DISTAL CLAVICULECTOMY DECOMPRESSION PARTIAL ACROMIOPLASTY WITH CORACOACROMIAL WITH ROTATOR CUFF REPAIR ;  Surgeon: Renette Butters, MD;  Location: [facility];  Service: Orthopedics;  Laterality: Left;    FAMILY HISTORY: family history includes Diabetes in his sister; Hypertension in his mother  and sister; Prostate cancer in his father.  SOCIAL HISTORY:  reports that he has quit smoking. His smoking use included cigarettes. He has a 12.50 pack-year smoking history. He has never used  smokeless tobacco. He reports current alcohol use of about 8.0 standard drinks of alcohol per week. He reports that he does not use drugs.  ALLERGIES: Lisinopril and Iohexol  MEDICATIONS:  Current Outpatient Medications  Medication Sig Dispense Refill   amLODipine (NORVASC) 5 MG tablet Take 1 tablet (5 mg total) daily by mouth. 90 tablet 3   apixaban (ELIQUIS) 5 MG TABS tablet Take 1 tablet (5 mg total) by mouth 2 (two) times daily. 180 tablet 3   aspirin EC 81 MG tablet Take 81 mg by mouth daily.     atorvastatin (LIPITOR) 80 MG tablet Take 1 tablet (80 mg total) by mouth daily. (Patient taking differently: Take 80 mg by mouth every evening.) 90 tablet 3   carvedilol (COREG) 25 MG tablet Take 12.5 mg by mouth 2 (two) times daily with a meal.     diltiazem (CARDIZEM) 30 MG tablet TAKE 1 TABLET BY MOUTH FOUR TIMES DAILY AS NEEDED FOR IRREGULAR HEART RATE GREATER THAN 100 AND BLOOD PRESSURE GREATER THAN 100 45 tablet 6   famotidine (PEPCID) 40 MG tablet Take 1 tablet (40 mg total) by mouth 2 (two) times daily. 180 tablet 2   fish oil-omega-3 fatty acids 1000 MG capsule Take 1,000 mg by mouth in the morning.     furosemide (LASIX) 20 MG tablet Take 20 mg by mouth in the morning.     irbesartan (AVAPRO) 300 MG tablet TAKE 1/2 TABLET(150 MG) BY MOUTH DAILY 90 tablet 3   isosorbide mononitrate (IMDUR) 60 MG 24 hr tablet TAKE 1 TABLET BY MOUTH EVERY DAY ( CAN NOT BE FILL 06.04.2018) 90 tablet 2   KLOR-CON M10 10 MEQ tablet TAKE 1 TABLET BY MOUTH TWICE A DAY 180 tablet 3   loratadine (CLARITIN) 10 MG tablet Take 10 mg by mouth in the morning.     Multiple Vitamin (MULTIVITAMIN WITH MINERALS) TABS tablet Take 1 tablet by mouth daily.     nitroGLYCERIN (NITROSTAT) 0.4 MG SL tablet DISSOLVE 1 TABLET UNDER THE TONGUE EVERY 5 MINUTES AS NEEDED FOR CHEST PAIN( SHORTNESS OF BREATH) 25 tablet 3   omeprazole (PRILOSEC) 20 MG capsule Take 20 mg by mouth daily before breakfast.     No current  facility-administered medications for this encounter.    REVIEW OF SYSTEMS:  Notable for that above.   PHYSICAL EXAM:  vitals were not taken for this visit.   General: Alert and oriented, in no acute distress HEENT: Head is normocephalic. Extraocular movements are intact. Oropharynx is notable for ***. Neck: Neck is notable for *** Heart: Regular in rate and rhythm with no murmurs, rubs, or gallops. Chest: Clear to auscultation bilaterally, with no rhonchi, wheezes, or rales. Abdomen: Soft, nontender, nondistended, with no rigidity or guarding. Extremities: No cyanosis or edema. Lymphatics: see Neck Exam Skin: No concerning lesions. Musculoskeletal: symmetric strength and muscle tone throughout. Neurologic: Cranial nerves II through XII are grossly intact. No obvious focalities. Speech is fluent. Coordination is intact. Psychiatric: Judgment and insight are intact. Affect is appropriate.   ECOG = ***  0 - Asymptomatic (Fully active, able to carry on all predisease activities without restriction)  1 - Symptomatic but completely ambulatory (Restricted in physically strenuous activity but ambulatory and able to carry out work of a light or sedentary nature. For  example, light housework, office work)  2 - Symptomatic, <50% in bed during the day (Ambulatory and capable of all self care but unable to carry out any work activities. Up and about more than 50% of waking hours)  3 - Symptomatic, >50% in bed, but not bedbound (Capable of only limited self-care, confined to bed or chair 50% or more of waking hours)  4 - Bedbound (Completely disabled. Cannot carry on any self-care. Totally confined to bed or chair)  5 - Death   Eustace Pen MM, Creech RH, Tormey DC, et al. [id number]). "Toxicity and response criteria of the [facility]". [place] Oncol. 5 (6): 649-55   LABORATORY DATA:  Lab Results  Component Value Date   WBC 4.2 09/14/2022   HGB 15.9 09/14/2022   HCT 45.9  09/14/2022   MCV 98.5 09/14/2022   PLT 104 (L) 09/14/2022   CMP     Component Value Date/Time   NA 138 09/14/2022 0653   NA 142 09/11/2020 0938   K 3.6 09/14/2022 0653   CL 106 09/14/2022 0653   CO2 23 09/14/2022 0653   GLUCOSE 100 (H) 09/14/2022 0653   BUN 14 09/14/2022 0653   BUN 12 09/11/2020 0938   CREATININE 1.23 09/14/2022 0653   CREATININE 1.28 (H) 03/30/2016 1416   CALCIUM 8.7 (L) 09/14/2022 0653   PROT 7.0 09/14/2022 0653   ALBUMIN 3.8 09/14/2022 0653   AST 36 09/14/2022 0653   ALT 39 09/14/2022 0653   ALKPHOS 73 09/14/2022 0653   BILITOT 0.9 09/14/2022 0653   GFRNONAA 59 (L) 09/14/2022 0653   GFRAA 68 09/11/2020 0938      Lab Results  Component Value Date   TSH 1.456 03/11/2017     RADIOGRAPHY: No results found.    IMPRESSION/PLAN:  This is a delightful patient with head and neck cancer. I *** recommend radiotherapy for this patient.  We discussed the potential risks, benefits, and side effects of radiotherapy. We talked in detail about acute and late effects. We discussed that some of the most bothersome acute effects may be mucositis, dysgeusia, salivary changes, skin irritation, hair loss, dehydration, weight loss and fatigue. We talked about late effects which include but are not necessarily limited to dysphagia, hypothyroidism, nerve injury, vascular injury, spinal cord injury, xerostomia, trismus, neck edema, and potential injury to any of the tissues in the head and neck region. No guarantees of treatment were given. A consent form was signed and placed in the patient's medical record. The patient is enthusiastic about proceeding with treatment. I look forward to participating in the patient's care.    Simulation (treatment planning) will take place ***  We also discussed that the treatment of head and neck cancer is a multidisciplinary process to maximize treatment outcomes and quality of life. For this reason the following referrals have been or will be  made:  *** Medical oncology to discuss chemotherapy   *** Dentistry for dental evaluation, possible extractions in the radiation fields, and /or advice on reducing risk of cavities, osteoradionecrosis, or other oral issues.  *** Nutritionist for nutrition support during and after treatment.  *** Speech language pathology for swallowing and/or speech therapy.  *** Social work for social support.   *** Physical therapy due to risk of lymphedema in neck and deconditioning.  *** Baseline labs including TSH.  On date of service, in total, I spent *** minutes on this encounter. Patient was seen in person.  __________________________________________   Eppie Gibson, MD  This document serves as a record of services personally performed by Eppie Gibson, MD. It was created on her behalf by Roney Mans, a trained medical scribe. The creation of this record is based on the scribe's personal observations and the provider's statements to them. This document has been checked and approved by the attending provider.

## 2022-10-11 NOTE — Progress Notes (Signed)
Oncology Nurse Navigator Documentation   Placed introductory call to new referral patient [facility]. Introduced myself as the H&N oncology nurse navigator that works with Dr. Isidore Moos to whom he has been referred by Dr. Constance Holster. He confirmed understanding of referral. Briefly explained my role as his navigator, provided my contact information.  Confirmed understanding of upcoming appts and [place] location, explained arrival and registration process. I explained the purpose of a dental evaluation prior to starting RT, indicated he would be contacted by [facility] DM to arrange an appt.   I encouraged him to call with questions/concerns as he moves forward with appts and procedures.   He verbalized understanding of information provided, expressed appreciation for my call.   Navigator Initial Assessment Employment Status: he is retired Currently on [employer] / STD: no Living Situation: he lives with his wife.  Support System: wife, family PCP: [name] MD at the [facility] PCD:  Financial Concerns: no Transportation Needs: no Sensory Deficits: no Language Barriers/Interpreter Needed:  no Ambulation Needs: no Psychosocial Needs:  no Concerns/Needs Understanding Cancer:  addressed/answered by navigator to best of ability Self-Expressed Needs: no   [name] RN, BSN, OCN Head & Neck Oncology Nurse [place] at [facility] Phone # [phone]  Fax # [phone]

## 2022-10-12 ENCOUNTER — Other Ambulatory Visit: Payer: Self-pay

## 2022-10-12 ENCOUNTER — Encounter: Payer: Self-pay | Admitting: Radiation Oncology

## 2022-10-12 ENCOUNTER — Ambulatory Visit
Admission: RE | Admit: 2022-10-12 | Discharge: 2022-10-12 | Disposition: A | Discharge status: Home / Self Care | Payer: No Typology Code available for payment source | Source: Ambulatory Visit | Attending: Radiation Oncology | Admitting: Radiation Oncology

## 2022-10-12 ENCOUNTER — Telehealth: Payer: Self-pay | Admitting: Hematology and Oncology

## 2022-10-12 VITALS — BP 135/75 | HR 58 | Temp 97.5°F | Resp 18

## 2022-10-12 VITALS — BP 135/75 | HR 58 | Temp 97.5°F | Resp 18 | Ht 74.0 in | Wt 215.0 lb

## 2022-10-12 DIAGNOSIS — F17210 Nicotine dependence, cigarettes, uncomplicated: Secondary | ICD-10-CM | POA: Insufficient documentation

## 2022-10-12 DIAGNOSIS — C329 Malignant neoplasm of larynx, unspecified: Secondary | ICD-10-CM

## 2022-10-12 DIAGNOSIS — C320 Malignant neoplasm of glottis: Secondary | ICD-10-CM

## 2022-10-12 MED ORDER — OXYMETAZOLINE HCL 0.05 % NA SOLN
1.0000 | Freq: Two times a day (BID) | NASAL | Status: DC
  Administered 2022-10-12: 1 via NASAL
  Filled 2022-10-12: qty 30

## 2022-10-12 NOTE — Progress Notes (Signed)
ambulatory

## 2022-10-12 NOTE — Progress Notes (Signed)
Oncology Nurse Navigator Documentation   Met with patient during initial consult with Dr. Isidore Moos. He was accompanied by his wife.  Further introduced myself as his/their Navigator, explained my role as a member of the Care Team. Provided New Patient resource guide binder: Contact information for physicians, this navigator, other members of the Care Team Advance Directive information; provided [facility] AD booklet at their request,  Fall Prevention Patient [place] Information sheet Symptom Management Clinic information [place] campus map with highlight of [place] [facility] Information sheet Head and Neck cancer basics Nutrition information Patient and family support information including Spiritual care/Chaplain information, Peer mentor program, health and wellness classes, and the survivorship program Community resources  Provided and discussed educational handouts for PEG and PAC. Assisted with post-consult appt scheduling. Referral placed to medical oncology to discuss chemotherapy.   They verbalized understanding of information provided. I encouraged them to call with questions/concerns moving forward.  [name], RN, BSN, OCN Head & Neck Oncology Nurse [place] at [place] [phone]

## 2022-10-12 NOTE — Telephone Encounter (Signed)
Scheduled appointment per 11/01. Patient is aware of appointment date and time. Patient is aware to arrive 15 mins prior to appointment time and to bring updated insurance cards. Patient is aware of location.   

## 2022-10-17 ENCOUNTER — Ambulatory Visit
Admission: RE | Admit: 2022-10-17 | Discharge: 2022-10-17 | Disposition: A | Discharge status: Home / Self Care | Payer: No Typology Code available for payment source | Source: Ambulatory Visit | Attending: Radiation Oncology | Admitting: Radiation Oncology

## 2022-10-17 DIAGNOSIS — Z510 Encounter for antineoplastic radiation therapy: Secondary | ICD-10-CM | POA: Insufficient documentation

## 2022-10-17 DIAGNOSIS — C320 Malignant neoplasm of glottis: Secondary | ICD-10-CM | POA: Insufficient documentation

## 2022-10-17 NOTE — Progress Notes (Signed)
Oncology Nurse Navigator Documentation   Mr. Pangborn presented for his CT simulation today and  tolerated procedure without difficulty. He will meet Dr. Chryl Heck on 11/10 for medical oncology consultation. At this time he is scheduled to start radiation on 10/26/22. He knows to call me if he has any questions or concerns.  [name] RN, BSN, OCN Head & Neck Oncology Nurse [place] at [facility] Phone # [phone]  Fax # [phone]

## 2022-10-18 ENCOUNTER — Other Ambulatory Visit: Payer: Self-pay

## 2022-10-18 DIAGNOSIS — C320 Malignant neoplasm of glottis: Secondary | ICD-10-CM

## 2022-10-20 ENCOUNTER — Inpatient Hospital Stay: Payer: No Typology Code available for payment source

## 2022-10-20 NOTE — Progress Notes (Signed)
[place] Work  Clinical Social Work was referred by medical provider for assessment of psychosocial needs.  Clinical Social Worker contacted patient by phone  to offer support and assess for needs.   Patient reported being "ok" since the diagnosis.  His wife is adjusting also.  He receives services from the [place] and [employer].  He reported no problems obtaining his basic needs and has a very good support system.  He agreed to have CSW mail contact information and program literature.  He stated he did not have any questions or concerns at this time.     Margaree Mackintosh, LCSW  Clinical Social Worker [facility]

## 2022-10-21 ENCOUNTER — Inpatient Hospital Stay
Payer: No Typology Code available for payment source | Attending: Hematology and Oncology | Admitting: Hematology and Oncology

## 2022-10-21 ENCOUNTER — Inpatient Hospital Stay: Payer: No Typology Code available for payment source | Admitting: Dietician

## 2022-10-21 ENCOUNTER — Other Ambulatory Visit: Payer: Self-pay

## 2022-10-21 ENCOUNTER — Inpatient Hospital Stay: Payer: No Typology Code available for payment source

## 2022-10-21 VITALS — BP 122/88 | HR 89 | Temp 97.7°F | Resp 16 | Ht 74.0 in | Wt 214.3 lb

## 2022-10-21 DIAGNOSIS — Z87891 Personal history of nicotine dependence: Secondary | ICD-10-CM | POA: Insufficient documentation

## 2022-10-21 DIAGNOSIS — Z8042 Family history of malignant neoplasm of prostate: Secondary | ICD-10-CM | POA: Diagnosis not present

## 2022-10-21 DIAGNOSIS — C320 Malignant neoplasm of glottis: Secondary | ICD-10-CM | POA: Insufficient documentation

## 2022-10-21 DIAGNOSIS — D696 Thrombocytopenia, unspecified: Secondary | ICD-10-CM | POA: Diagnosis not present

## 2022-10-21 LAB — COMPREHENSIVE METABOLIC PANEL
ALT: 28 U/L (ref 0–44)
AST: 22 U/L (ref 15–41)
Albumin: 3.9 g/dL (ref 3.5–5.0)
Alkaline Phosphatase: 89 U/L (ref 38–126)
Anion gap: 4 — ABNORMAL LOW (ref 5–15)
BUN: 13 mg/dL (ref 8–23)
CO2: 28 mmol/L (ref 22–32)
Calcium: 8.7 mg/dL — ABNORMAL LOW (ref 8.9–10.3)
Chloride: 110 mmol/L (ref 98–111)
Creatinine, Ser: 1.18 mg/dL (ref 0.61–1.24)
GFR, Estimated: >60 mL/min (ref >60)
Glucose, Bld: 98 mg/dL (ref 70–99)
Potassium: 4.1 mmol/L (ref 3.5–5.1)
Sodium: 142 mmol/L (ref 135–145)
Total Bilirubin: 0.7 mg/dL (ref 0.3–1.2)
Total Protein: 7.2 g/dL (ref 6.5–8.1)

## 2022-10-21 LAB — CBC WITH DIFFERENTIAL/PLATELET
Abs Immature Granulocytes: 0 10*3/uL (ref 0.00–0.07)
Basophils Absolute: 0 10*3/uL (ref 0.0–0.1)
Basophils Relative: 1 %
Eosinophils Absolute: 0.1 10*3/uL (ref 0.0–0.5)
Eosinophils Relative: 3 %
HCT: 45.1 % (ref 39.0–52.0)
Hemoglobin: 16.1 g/dL (ref 13.0–17.0)
Immature Granulocytes: 0 %
Lymphocytes Relative: 42 %
Lymphs Abs: 1.5 10*3/uL (ref 0.7–4.0)
MCH: 34.1 pg — ABNORMAL HIGH (ref 26.0–34.0)
MCHC: 35.7 g/dL (ref 30.0–36.0)
MCV: 95.6 fL (ref 80.0–100.0)
Monocytes Absolute: 0.3 10*3/uL (ref 0.1–1.0)
Monocytes Relative: 9 %
Neutro Abs: 1.6 10*3/uL — ABNORMAL LOW (ref 1.7–7.7)
Neutrophils Relative %: 45 %
Platelets: 122 10*3/uL — ABNORMAL LOW (ref 150–400)
RBC: 4.72 MIL/uL (ref 4.22–5.81)
RDW: 12.7 % (ref 11.5–15.5)
WBC: 3.6 10*3/uL — ABNORMAL LOW (ref 4.0–10.5)
nRBC: 0 % (ref 0.0–0.2)

## 2022-10-21 LAB — TSH: TSH: 0.522 u[IU]/mL (ref 0.350–4.500)

## 2022-10-21 NOTE — Progress Notes (Signed)
START ON PATHWAY REGIMEN - Head and Neck     A cycle is every 7 days:     Cisplatin   **Always confirm dose/schedule in your pharmacy ordering system**  Patient Characteristics: Larynx, Preoperative or Nonsurgical Candidate, Stage III - IVB Disease Classification: Larynx AJCC T Category: T3 AJCC 8 Stage Grouping: III Therapeutic Status: Preoperative or Nonsurgical Candidate AJCC N Category: cN0 AJCC M Category: M0 Intent of Therapy: Curative Intent, Discussed with Patient

## 2022-10-21 NOTE — Progress Notes (Signed)
Oncology Nurse Navigator Documentation   Met with patient during initial consult with Dr. Chryl Heck. He was accompanied by his wife.  Further introduced myself as his/their Navigator, explained my role as a member of the Care Team. Assisted with post-consult appt scheduling.  They verbalized understanding of information provided. I encouraged them to call with questions/concerns moving forward. I have gotten his PET scan ordered by Dr. Chryl Heck scheduled and will call them with the date/time.   [name], RN, BSN, OCN Head & Neck Oncology Nurse [place] at [place] [phone]

## 2022-10-21 NOTE — Progress Notes (Signed)
[facility] CONSULT NOTE  Patient Care Team: Constance Haw, MD as PCP - General (Cardiology) Constance Haw, MD as PCP - Electrophysiology (Cardiology) Nahser, Wonda Cheng, MD (Cardiology) Malmfelt, Stephani Police, RN as Oncology Nurse Navigator Izora Gala, MD as Consulting Physician (Otolaryngology) Eppie Gibson, MD as Consulting Physician (Radiation Oncology)  CHIEF COMPLAINTS/PURPOSE OF CONSULTATION:  SCC glottis  ASSESSMENT & PLAN:   This is a very pleasant 81 year old male patient with history of coronary artery disease, abdominal aortic aneurysm, hypertension, newly diagnosed with SCC of the vocal cord, clinically staged as T3N0, with previous chest CT which showed some nonspecific bilateral lung nodules and nonspecific borderline enlarged lymph nodes referred to medical oncology for additional recommendations.  We presented him in the head and neck tumor board and the recommendation was to consider chemoradiation if he is a candidate given T3N0 disease.  He already saw Dr. Isidore Moos, will be starting radiation coming Wednesday.  At baseline, he is healthy for 81, independent with all activities.  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 Physical examination today, no palpable regional adenopathy, rate and rhythm regular, no adventitious sounds, lower extremity edema. I recommended we will proceed with PET/CT for completion of staging since the last imaging had a lot of motion artifact and some borderline AP window adenopathy.  If he has no evidence of metastatic disease, I am willing to try weekly cisplatin for 6 cycles.  We will dose adjust based on his creatinine. We have today discussed about the schedule, adverse effects of chemotherapy including but not limited to fatigue, nausea, vomiting, increased risk of infections, ototoxicity, nephrotoxicity and neuropathy. He will return to clinic in 1 week to review PET/CT results and to initiate chemotherapy. I have  tentatively placed concurrent chemo plan, will arrange for chemo teach, first cycle of infusion for 1117. He understands that really adverse effects from chemoradiation can be life-threatening and permanent.  HISTORY OF PRESENTING ILLNESS:  Rodney [name] 81 y.o. male is here because of new SCC glottis  Oncologic History  She reported progressive hoarseness over the course of 1-2 yrs, went to Dr Constance Holster for further evaluation. Soft tissue neck CT without contrast on 08/18/22 showed an irregularity of the anterior aspect of the left vocal cord with apparent extension to the left anterior hypoglottis.  At tumor board today there was also appreciation of paraglottic invasion on the left.     Chest CT also performed on that date showed emphysematous changes of the lungs with a few nonspecific bilateral lung nodules (measuring around 2-4 mm). CT also showed nonspecific borderline enlarged right lower paratracheal and AP window lymph nodes measuring up to 1 cm.   Indirect exam of the larynx performed during this visit revealed a left vocal cord mass. No palpable cervical adenopathy or masses were appreciated.  Given these findings, Dr. Constance Holster recommended proceeding with microlaryngoscopy and biopsies of the laryngeal mass.   Biopsies of the left vocal cord mass on 09/14/22 collected by Dr. Constance Holster revealed invasive moderately differentiated squamous cell carcinoma.   He is a retired English as a second language teacher.  He arrived to the appointment today with his wife.  Besides hoarseness, he denies any complaints.  He is active for his age, does most of his daily activities, plays golf regularly although he uses a golf cart to walk between holes.  He used to be athletic, played sports in the [employer] and retired out of the [employer] back in 1970s.  Rest of the pertinent 10 point  ROS reviewed and negative  MEDICAL HISTORY:  Past Medical History:  Diagnosis Date   AAA (abdominal aortic aneurysm) ([place])    stent graft put in 02/2009    Arthritis    CAD (coronary artery disease)    a. cath 2010 b. CAD s/p PCI to ramus and LCX on 03/04/15   Dysrhythmia    Gout    Hyperlipidemia    Hypertension    Paroxysmal atrial fibrillation ([place])    Tobacco abuse     SURGICAL HISTORY: Past Surgical History:  Procedure Laterality Date   ABDOMINAL AORTIC ANEURYSM REPAIR  02/19/2009   performed by [place] N/A 04/28/2017   Procedure: Atrial Fibrillation Ablation;  Surgeon: Constance Haw, MD;  Location: [facility];  Service: Cardiovascular;  Laterality: N/A;   COLONOSCOPY     KNEE ARTHROSCOPY  2003   left   LEFT AND RIGHT HEART CATHETERIZATION WITH CORONARY ANGIOGRAM N/A 03/03/2015   Procedure: LEFT AND RIGHT HEART CATHETERIZATION WITH CORONARY ANGIOGRAM;  Surgeon: Troy Sine, MD;     MICROLARYNGOSCOPY N/A 09/14/2022   Procedure: MICROLARYNGOSCOPY WITH VOCAL CORD BIOPSY;  Surgeon: Izora Gala, MD;  Location: [facility];  Service: ENT;  Laterality: N/A;   PERCUTANEOUS CORONARY STENT INTERVENTION (PCI-S) N/A 03/04/2015   Procedure: PERCUTANEOUS CORONARY STENT INTERVENTION (PCI-S);  Surgeon: Troy Sine, MD; RI 99>>0% w/   BMS, bifurcation CFX-OM 90>>40% w/ Angiosculpt PTCA; dCFX 90>>0% w/   2.7520 mm Rebel BMS   SHOULDER ARTHROSCOPY WITH ROTATOR CUFF REPAIR AND SUBACROMIAL DECOMPRESSION Left 11/19/2013   Procedure: LEFT SHOULDER ARTHROSCOPY DEBRIDEMENT EXTENTSIVE DISTAL CLAVICULECTOMY DECOMPRESSION PARTIAL ACROMIOPLASTY WITH CORACOACROMIAL WITH ROTATOR CUFF REPAIR ;  Surgeon: Renette Butters, MD;  Location: [facility];  Service: Orthopedics;  Laterality: Left;    SOCIAL HISTORY: Social History   Socioeconomic History   Marital status: Married    Spouse name: Not on file   Number of children: Not on file   Years of education: Not on file   Highest education level: Not on file  Occupational History   Not on file  Tobacco Use   Smoking status: Former    Packs/day: 0.25     Years: 50.00    Total pack years: 12.50    Types: Cigarettes   Smokeless tobacco: Never  Substance and Sexual Activity   Alcohol use: Yes    Alcohol/week: 8.0 standard drinks of alcohol    Types: 1 Cans of beer, 7 Shots of liquor per week   Drug use: No   Sexual activity: Not Currently    Comment: cutting down 1/2 ppd  Other Topics Concern   Not on file  Social History Narrative   Lives with wife, does not use assist, drives.  No home health services.     Social Determinants of Health   Financial Resource Strain: Not on file  Food Insecurity: Not on file  Transportation Needs: Not on file  Physical Activity: Not on file  Stress: Not on file  Social Connections: Not on file  Intimate Partner Violence: Not on file    FAMILY HISTORY: Family History  Problem Relation Age of Onset   Prostate cancer Father    Hypertension Mother    Diabetes Sister    Hypertension Sister    Stroke Neg Hx    Heart attack Neg Hx     ALLERGIES:  is allergic to lisinopril and iohexol.  MEDICATIONS:  Current Outpatient Medications  Medication Sig Dispense  Refill   amLODipine (NORVASC) 5 MG tablet Take 1 tablet (5 mg total) daily by mouth. 90 tablet 3   apixaban (ELIQUIS) 5 MG TABS tablet Take 1 tablet (5 mg total) by mouth 2 (two) times daily. 180 tablet 3   aspirin EC 81 MG tablet Take 81 mg by mouth daily.     atorvastatin (LIPITOR) 80 MG tablet Take 1 tablet (80 mg total) by mouth daily. (Patient taking differently: Take 80 mg by mouth every evening.) 90 tablet 3   carvedilol (COREG) 25 MG tablet Take 12.5 mg by mouth 2 (two) times daily with a meal.     diltiazem (CARDIZEM) 30 MG tablet TAKE 1 TABLET BY MOUTH FOUR TIMES DAILY AS NEEDED FOR IRREGULAR HEART RATE GREATER THAN 100 AND BLOOD PRESSURE GREATER THAN 100 45 tablet 6   famotidine (PEPCID) 40 MG tablet Take 1 tablet (40 mg total) by mouth 2 (two) times daily. 180 tablet 2   fish oil-omega-3 fatty acids 1000 MG capsule Take 1,000 mg by  mouth in the morning.     furosemide (LASIX) 20 MG tablet Take 20 mg by mouth in the morning.     irbesartan (AVAPRO) 300 MG tablet TAKE 1/2 TABLET(150 MG) BY MOUTH DAILY 90 tablet 3   isosorbide mononitrate (IMDUR) 60 MG 24 hr tablet TAKE 1 TABLET BY MOUTH EVERY DAY ( CAN NOT BE FILL 06.04.2018) 90 tablet 2   KLOR-CON M10 10 MEQ tablet TAKE 1 TABLET BY MOUTH TWICE A DAY 180 tablet 3   loratadine (CLARITIN) 10 MG tablet Take 10 mg by mouth in the morning.     Multiple Vitamin (MULTIVITAMIN WITH MINERALS) TABS tablet Take 1 tablet by mouth daily.     nicotine (NICODERM CQ - DOSED IN MG/24 HOURS) 14 mg/24hr patch Place 1 patch onto the skin daily.     nitroGLYCERIN (NITROSTAT) 0.4 MG SL tablet DISSOLVE 1 TABLET UNDER THE TONGUE EVERY 5 MINUTES AS NEEDED FOR CHEST PAIN( SHORTNESS OF BREATH) 25 tablet 3   omeprazole (PRILOSEC) 20 MG capsule Take 20 mg by mouth daily before breakfast.     No current facility-administered medications for this visit.     PHYSICAL EXAMINATION: ECOG PERFORMANCE STATUS: 0 - Asymptomatic  Vitals:   10/21/22 1013  BP: 122/88  Pulse: 89  Resp: 16  Temp: 97.7 F (36.5 C)  SpO2: 98%   Filed Weights   10/21/22 1013  Weight: 214 lb 4.8 oz (97.2 kg)   Physical Exam Constitutional:      Appearance: Normal appearance.  Neck:     Comments: No palpable lymphadenopathy in the neck Cardiovascular:     Rate and Rhythm: Normal rate and regular rhythm.  Pulmonary:     Effort: Pulmonary effort is normal.     Breath sounds: Normal breath sounds.  Musculoskeletal:        General: No swelling. Normal range of motion.     Cervical back: Normal range of motion and neck supple. No rigidity.  Lymphadenopathy:     Cervical: No cervical adenopathy.  Neurological:     Mental Status: He is alert.      LABORATORY DATA:  I have reviewed the data as listed Lab Results  Component Value Date   WBC 4.2 09/14/2022   HGB 15.9 09/14/2022   HCT 45.9 09/14/2022   MCV 98.5  09/14/2022   PLT 104 (L) 09/14/2022     Chemistry      Component Value Date/Time   NA 138 09/14/2022  0653   NA 142 09/11/2020 0938   K 3.6 09/14/2022 0653   CL 106 09/14/2022 0653   CO2 23 09/14/2022 0653   BUN 14 09/14/2022 0653   BUN 12 09/11/2020 0938   CREATININE 1.23 09/14/2022 0653   CREATININE 1.28 (H) 03/30/2016 1416      Component Value Date/Time   CALCIUM 8.7 (L) 09/14/2022 0653   ALKPHOS 73 09/14/2022 0653   AST 36 09/14/2022 0653   ALT 39 09/14/2022 0653   BILITOT 0.9 09/14/2022 0653       RADIOGRAPHIC STUDIES: I have personally reviewed the radiological images as listed and agreed with the findings in the report. No results found.  All questions were answered. The patient knows to call the clinic with any problems, questions or concerns. I spent 60 minutes in the care of this patient including H and P, review of records, counseling and coordination of care.     Benay Pike, MD 10/21/2022 10:19 AM

## 2022-10-21 NOTE — Progress Notes (Signed)
Nutrition Assessment   Reason for Assessment: HNC   ASSESSMENT: 81 year old male with newly diagnosed glottis cancer. Pending start of radiation therapy (planned 11/15)  Past medical history includes HTN, chronic combined CHF, demand ischemia, CAD, TIA (2016), progressive angina, AAA s/p repair, atrial fibrillation with RVR, cardiomyopathy, OSA  Met with patient and wife following appointment with Dr. Iruku. Patient reports option for chemotherapy discussed today. This will be determined pending PET results. Dr. Iruku [facility]. Patient denies nausea, vomiting, diarrhea, constipation. Patient tolerating regular diet without difficulty. He has a good appetite, recalls 2-3 meals plus dessert. Patient eats a "good breakfast" (eggs, grits, turkey sausage, salmon, coffee), sandwich for lunch and has a "good dinner." Last night pt had fish, rice, bread, beans from [employer]. He had sherbet and piece of cake for dessert. He is drinking 48-64 ounces water. Patient endorses stable weight and is active.   Nutrition Focused Physical Exam: deferred   Medications: eliquis, lipitor, coreg, cardizem, pepcid, lasix, imdur, MVI   Labs: reviewed   Anthropometrics:   Height: 6'2" Weight: 214 lb 4.8 oz UBW: 215 lb  BMI: 27.51  11/1 - 215 lb    NUTRITION DIAGNOSIS: Predicted suboptimal intake related to HNC and associated treatment as evidenced by likely radiation therapy side effects including dry mouth, thick saliva, sore throat, altered taste affecting ability to eat/drink as normal   INTERVENTION:  Educated on importance of increased calorie and protein energy needs to maintain weight/strength during treatment - shake recipes given Encouraged high protein snacks in between meals - handout with snack ideas + soft moist high protein foods provided Suggested daily ONS, provided samples of Ensure, Boost, and CIB for pt to try  Contact information provided   MONITORING,  EVALUATION, GOAL: Patient will tolerate increased calories and protein to minimize weight loss during treatment    Next Visit: To be scheduled weekly with treatment plan       

## 2022-10-23 ENCOUNTER — Other Ambulatory Visit: Payer: Self-pay

## 2022-10-25 DIAGNOSIS — Z510 Encounter for antineoplastic radiation therapy: Secondary | ICD-10-CM | POA: Diagnosis not present

## 2022-10-26 ENCOUNTER — Encounter
Admission: RE | Admit: 2022-10-26 | Discharge: 2022-10-26 | Disposition: A | Discharge status: Home / Self Care | Payer: No Typology Code available for payment source | Source: Ambulatory Visit | Attending: Hematology and Oncology | Admitting: Hematology and Oncology

## 2022-10-26 ENCOUNTER — Other Ambulatory Visit: Payer: Self-pay

## 2022-10-26 ENCOUNTER — Ambulatory Visit
Admission: RE | Admit: 2022-10-26 | Discharge: 2022-10-26 | Disposition: A | Discharge status: Home / Self Care | Payer: No Typology Code available for payment source | Source: Ambulatory Visit | Attending: Radiation Oncology | Admitting: Radiation Oncology

## 2022-10-26 DIAGNOSIS — C320 Malignant neoplasm of glottis: Secondary | ICD-10-CM | POA: Diagnosis present

## 2022-10-26 DIAGNOSIS — Z510 Encounter for antineoplastic radiation therapy: Secondary | ICD-10-CM | POA: Diagnosis not present

## 2022-10-26 LAB — RAD ONC ARIA SESSION SUMMARY
Course Elapsed Days: 0
Plan Fractions Treated to Date: 1
Plan Prescribed Dose Per Fraction: 2 Gy
Plan Total Fractions Prescribed: 35
Plan Total Prescribed Dose: 70 Gy
Reference Point Dosage Given to Date: 2 Gy
Reference Point Session Dosage Given: 2 Gy
Session Number: 1

## 2022-10-26 LAB — GLUCOSE, CAPILLARY: Glucose-Capillary: 100 mg/dL — ABNORMAL HIGH (ref 70–99)

## 2022-10-26 MED ORDER — FLUDEOXYGLUCOSE F - 18 (FDG) INJECTION
11.1000 | Freq: Once | INTRAVENOUS | Status: AC | PRN
  Administered 2022-10-26: 11.93 via INTRAVENOUS

## 2022-10-26 NOTE — Therapy (Signed)
OUTPATIENT PHYSICAL THERAPY HEAD AND NECK BASELINE EVALUATION   Patient Name: Rodney [name] MRN: [id number] DOB:[birth year], 81 y.o., male Today's Date: 10/27/2022   PT End of Session - 10/27/22 1009     Visit Number 1    Number of Visits 2    Date for PT Re-Evaluation 01/05/23    PT Start Time 0940    PT Stop Time 1005    PT Time Calculation (min) 25 min    Activity Tolerance Patient tolerated treatment well    Behavior During Therapy [facility] for tasks assessed/performed             Past Medical History:  Diagnosis Date   AAA (abdominal aortic aneurysm) ([place])    stent graft put in 02/2009   Arthritis    CAD (coronary artery disease)    a. cath 2010 b. CAD s/p PCI to ramus and LCX on 03/04/15   Dysrhythmia    Gout    Hyperlipidemia    Hypertension    Paroxysmal atrial fibrillation ([place])    Tobacco abuse    Past Surgical History:  Procedure Laterality Date   ABDOMINAL AORTIC ANEURYSM REPAIR  02/19/2009   performed by [place] N/A 04/28/2017   Procedure: Atrial Fibrillation Ablation;  Surgeon: Constance Haw, MD;  Location: [facility];  Service: Cardiovascular;  Laterality: N/A;   COLONOSCOPY     KNEE ARTHROSCOPY  2003   left   LEFT AND RIGHT HEART CATHETERIZATION WITH CORONARY ANGIOGRAM N/A 03/03/2015   Procedure: LEFT AND RIGHT HEART CATHETERIZATION WITH CORONARY ANGIOGRAM;  Surgeon: Troy Sine, MD;     MICROLARYNGOSCOPY N/A 09/14/2022   Procedure: MICROLARYNGOSCOPY WITH VOCAL CORD BIOPSY;  Surgeon: Izora Gala, MD;  Location: [facility];  Service: ENT;  Laterality: N/A;   PERCUTANEOUS CORONARY STENT INTERVENTION (PCI-S) N/A 03/04/2015   Procedure: PERCUTANEOUS CORONARY STENT INTERVENTION (PCI-S);  Surgeon: Troy Sine, MD; RI 99>>0% w/   BMS, bifurcation CFX-OM 90>>40% w/ Angiosculpt PTCA; dCFX 90>>0% w/   2.7520 mm Rebel BMS   SHOULDER ARTHROSCOPY WITH ROTATOR CUFF REPAIR AND SUBACROMIAL DECOMPRESSION Left 11/19/2013    Procedure: LEFT SHOULDER ARTHROSCOPY DEBRIDEMENT EXTENTSIVE DISTAL CLAVICULECTOMY DECOMPRESSION PARTIAL ACROMIOPLASTY WITH CORACOACROMIAL WITH ROTATOR CUFF REPAIR ;  Surgeon: Renette Butters, MD;  Location: [facility];  Service: Orthopedics;  Laterality: Left;   Patient Active Problem List   Diagnosis Date Noted   Malignant neoplasm of glottis ([place]) 10/12/2022   Palpitations 11/15/2021   Chest pain 07/22/2018   Abnormal EKG 07/22/2018   Nonischemic cardiomyopathy ([place]) 10/17/2017   AF (atrial fibrillation) ([place]) 04/28/2017   Premature ventricular contractions (PVCs) ([facility]) 03/11/2017   H/O endovascular stent graft for abdominal aortic aneurysm 09/01/2016   Cardiomyopathy, ischemic 03/30/2016   OSA (obstructive sleep apnea) 09/30/2015   Encounter for monitoring anti-arrhythmic therapy 05/04/2015   DOE (dyspnea on exertion) 03/16/2015   Atrial fibrillation with RVR ([place]) 03/16/2015   Hypertensive urgency 03/08/2015   Coronary artery disease due to lipid rich plaque    AA (aortic aneurysm) ([place])    Dyspnea 03/02/2015   Progressive angina ([place]) 03/02/2015   History of TIA (transient ischemic attack), 02/20/15 03/02/2015   Mitral regurgitation 03/02/2015   HX: anticoagulation 03/02/2015   Chest tightness    Tobacco abuse    HLD (hyperlipidemia)    TIA (transient ischemic attack) 02/20/2015   Leukopenia 02/20/2015   Chronic combined systolic and diastolic heart failure, NYHA class 1 ([place]) 02/20/2015  PAF (paroxysmal atrial fibrillation) ([place]) 01/29/2015   Accelerated hypertension, hx of 62/83/1517   Acute diastolic CHF (congestive heart failure), NYHA class 2 ([place]) 01/03/2015   Demand ischemia ([place]) 01/03/2015   Coronary artery disease involving native coronary artery of native heart without angina pectoris 01/03/2015   Elevated troponin level    Thrombocytopenia (H[facility])    Essential hypertension    S/P abdominal aortic aneurysm repair 07/03/2012    PCP: [name], MD, Dr. Cassell Clement at the [facility]  REFERRING PROVIDER: Eppie Gibson, MD  REFERRING DIAG: C32.0 (ICD-10-CM) - Malignant neoplasm of glottis [facility])   THERAPY DIAG:  Abnormal posture  Malignant neoplasm of glottis [facility])  Rationale for Evaluation and Treatment: Rehabilitation  ONSET DATE: 08/18/22  SUBJECTIVE:     SUBJECTIVE STATEMENT: Patient reports they are here today to be seen by their medical team for newly diagnosed cancer of left vocal cord.    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 He presented to his PCP at the [place] in September with progressive hoarseness over the course of 1-2 years. 08/18/22 CT neck showed an irregularity of the anterior aspect of the left vocal cord with apparent extension to the left anterior hypoglottis.  At tumor board today there was also appreciation of paraglottic invasion on the left.  Examination was limited due to motion artifact. Chest CT also performed on that date showed emphysematous changes of the lungs with a few nonspecific bilateral lung nodules (measuring around 2-4 mm). CT also showed nonspecific borderline enlarged right lower paratracheal and AP window lymph nodes measuring up to 1 cm. 08/19/22 He saw Dr. Constance Holster for further evaluation. Indirect exam of the larynx performed revealed a left vocal cord mass. No palpable cervical adenopathy or masses were appreciated. Dr. 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  Per op note:"The larynx was normal except for the following: There is a partially exophytic mass involving the left true cord extending superiorly to the floor of the ventricle and inferiorly to the inferior aspect of the cord.  The subglottic larynx was not involved.  The lesion extended from the midportion of the left cord anteriorly to the anterior commissure and there did not appear to be  involvement of the anterior commissure but no involvement of the right cord". He will receive 35 fractions of radiation to his Larynx and bilateral neck starting 10/26/22. Chemotherapy to be determined after 11/15 PET.   PATIENT GOALS:   to be educated about the signs and symptoms of lymphedema and learn post op HEP.   PAIN:  Are you having pain? No  PRECAUTIONS: Active CA, a-fib with stents placed, aortic aneurysm with stents placed 2010  WEIGHT BEARING RESTRICTIONS: No  FALLS:  Has patient fallen in last 6 months? No Does the patient have a fear of falling that limits activity? No Is the patient reluctant to leave the house due to a fear of falling?No  LIVING ENVIRONMENT: Patient lives with: wife Lives in: House/apartment Has following equipment at home: None  OCCUPATION: retired  LEISURE: pt plays golf 2-3x/wk 18 holes  PRIOR LEVEL OF FUNCTION: Independent   OBJECTIVE:  COGNITION: Overall cognitive status: Within functional limits for tasks assessed                  POSTURE:  Forward head and rounded shoulders posture  30 SEC SIT TO STAND: 11 reps in 30  sec without use of UEs which is  Average for patient's age  SHOULDER AROM:   WFL had rotator cuff surgery on L shoulder about 10 years ago   CERVICAL AROM:   Percent limited  Flexion WFL  Extension 25% limited  Right lateral flexion 75% limited  Left lateral flexion 75% limited  Right rotation 35% limited  Left rotation 35% limited    (Blank rows=not tested)  GAIT: Assessed: Yes Assistance needed: Independent Ambulation Distance: 10 feet Assistive Device: none Gait pattern: WFL Ambulation surface: Level  PATIENT EDUCATION:  Education details: Neck ROM, importance of posture when sitting, standing and lying down, deep breathing, walking program and importance of staying active throughout treatment, CURE article on staying active, "Why exercise?" flyer, lymphedema and PT info Person educated:  Patient Education method: Explanation, Demonstration, Handout Education comprehension: Patient verbalized understanding and returned demonstration  HOME EXERCISE PROGRAM: Patient was instructed today in a home exercise program today for head and neck range of motion exercises. These included active cervical flexion, active cervical extension, active cervical rotation to each direction, upper trap stretch, and shoulder retraction. Patient was encouraged to do these 2-3 times a day, holding for 5 sec each and completing for 5 reps. Pt was educated that once this becomes easier then hold the stretches for 30-60 seconds.    ASSESSMENT:  CLINICAL IMPRESSION: Pt arrives to PT with recently diagnosed left vocal cord cancer. Pt will undergo radiation to larynx and bilateral neck and may also receive chemo to be determined following PET scan. Pt will start treatment on 10/26/22 and complete on 12/19/22.  Pt's cervical ROM was limited in lateral flexion and rotation bilaterally. Educated pt about signs and symptoms of lymphedema as well as anatomy and physiology of lymphatic system. Educated pt in importance of staying as active as possible throughout treatment to decrease fatigue as well as head and neck ROM exercises to decrease loss of ROM. Will see pt after completion of radiation to reassess ROM and assess for lymphedema and to determine therapy needs at that time.  Pt will benefit from skilled therapeutic intervention to improve on the following deficits: Decreased knowledge of precautions and postural dysfunction.   PT treatment/interventions: ADL/self-care home management, pt/family education, therapeutic exercise.   REHAB POTENTIAL: Good  CLINICAL DECISION MAKING: Stable/uncomplicated  EVALUATION COMPLEXITY: Low   GOALS: Goals reviewed with patient? YES  LONG TERM GOALS: (STG=LTG)   Name Target Date  Goal status  1 Patient will be able to verbalize understanding of a home exercise program  for cervical range of motion, posture, and walking.   Baseline:  No knowledge 10/27/2022 Achieved at eval  2 Patient will be able to verbalize understanding of proper sitting and standing posture. Baseline:  No knowledge 10/27/2022 Achieved at eval  3 Patient will be able to verbalize understanding of lymphedema risk and availability of treatment for this condition Baseline:  No knowledge 10/27/2022 Achieved at eval  4 Pt will demonstrate a return to full cervical ROM and function post operatively compared to baselines and not demonstrate any signs or symptoms of lymphedema.  Baseline: See objective measurements taken today. 01/05/2023 New    PLAN:  PT FREQUENCY/DURATION: EVAL and 1 follow up appointment.   PLAN FOR NEXT SESSION: will reassess 2 weeks after completion of radiation to determine needs.  Patient will follow up at outpatient cancer rehab 2 weeks after completion of radiation.  If the patient requires physical therapy at that time, a specific plan will be dictated  and sent to the referring physician for approval. The patient was educated today on appropriate basic range of motion exercises to begin now and continue throughout radiation and educated on the signs and symptoms of lymphedema. Patient verbalized good understanding.     Physical Therapy Information for During and After Head/Neck Cancer Treatment: Lymphedema is a swelling condition that you may be at risk for in your neck and/or face if you have radiation treatment to the area and/or if you have surgery that includes removing lymph nodes.  There is treatment available for this condition and it is not life-threatening.  Contact your physician or physical therapist with concerns. An excellent resource for those seeking information on lymphedema is the National Lymphedema Network's website.  It can be accessed at [place].org If you notice swelling in your neck or face at any time following surgery (even if it is many  years from now), please contact your doctor or physical therapist to discuss this.  Lymphedema can be treated at any time but it is easier for you if it is treated early on. If you have had surgery to your neck, please check with your surgeon about how soon to start doing neck range of motion exercises.  If you are not having surgery, I encourage you to start doing neck range of motion exercises today and continue these while undergoing treatment, UNLESS you have irritation of your skin or soft tissue that is aggravated by doing them.  These exercises are intended to help you prevent loss of range of motion and/or to gain range of motion in your neck (which can be limited by tightening effects of radiation), and NOT to aggravate these tissues if they develop sensitivities from treatment. Neck range of motion exercises should be done to the point of feeling a GENTLE, TOLERABLE stretch only.  You are encouraged to start a walking or other exercise program tomorrow and continue this as much as you are able through and after treatment.  Please feel free to call me with any questions. Manus Gunning, PT, CLT Physical Therapist and Certified Lymphedema Therapist [facility] [address], [place], [place] [address] [phone] [email or web address]  WALKING  Walking is a great form of exercise to increase your strength, endurance and overall fitness.  A walking program can help you start slowly and gradually build endurance as you go.  Everyone's ability is different, so each person's starting point will be different.  You do not have to follow them exactly.  The are just samples. You should simply find out what's right for you and stick to that program.   In the beginning, you'll start off walking 2-3 times a day for short distances.  As you get stronger, you'll be walking further at just 1-2 times per day.  A. You Can Walk For A Certain Length Of Time  Each Day    Walk 5 minutes 3 times per day.  Increase 2 minutes every 2 days (3 times per day).  Work up to 25-30 minutes (1-2 times per day).   Example:   Day 1-2 5 minutes 3 times per day   Day 7-8 12 minutes 2-3 times per day   Day 13-14 25 minutes 1-2 times per day  B. You Can Walk For a Certain Distance Each Day     Distance can be substituted for time.    Example:   3 trips to mailbox (at road)   3 trips to corner of block  3 trips around the block  C. Go to local high school and use the track.    Walk for distance ____ around track  Or time ____ minutes  D. Walk ____ Jog ____ Run ___   Why exercise?  So many benefits! Here are SOME of them: Heart health, including raising your good cholesterol level and reducing heart rate and blood pressure Lung health, including improved lung capacity It burns fats, and most of [place] can stand to be leaner, whether or not we are overweight. It increases the body's natural painkillers and mood elevators, so makes you feel better. Not only makes you feel better, but look better too Improves sleep Takes a bite out of stress May decrease your risk of many types of cancer If you are currently undergoing cancer treatment, exercise may improve your ability to tolerate treatments including chemotherapy. For everybody, it can improve your energy level. Those with cancer-related fatigue report a 40-50% reduction in this symptom when exercising regularly. If you are a survivor of breast, colon, or prostate cancer, it may decrease your risk of a recurrence. (This may hold for other cancers too, but so far we have data just for these three types.)  How to exercise: Get your doctor's okay. Pick something you enjoy doing, like walking, Zumba, biking, swimming, or whatever. Start at low intensity and time, then gradually increase.  (See walking program handout.) Set a goal to achieve over time.  The American Cancer Society, American Heart  Association, and U.S. Dept. of Health and Human Services recommend 150 minutes of moderate exercise, 75 minutes of vigorous exercise, or a combination of both per week. This should be done in episodes at least 10 minutes long, spread throughout the week.  Need help being motivated? Pick something you enjoy doing, because you'll be more inclined to stick with that activity than something that feels like a chore. Do it with a friend so that you are accountable to each other. Schedule it into your day. Place it on your calendar and keep that appointment just like you do any appointment that you make. Join an exercise group that meets at a specific time.  That way, you have to show up on time, and that makes it harder to procrastinate about doing your workout.  It also keeps you accountable--people begin to expect you to be there. Join a gym where you feel comfortable and not intimidated, at the right cost. Sign up for something that you'll need to be in shape for on a specific date, like a 1K or a 5K to walk or run, a 20 or 30 mile bike ride, a mud run or something like that. If the date is looming, you know you'll need to train to be ready for it.  An added benefit is that many of these are fundraisers for good causes. If you've already paid for a gym membership, group exercise class or event, you might as well work out, so you haven't wasted your money!    [employer], PT 10/27/2022, 10:14 AM

## 2022-10-26 NOTE — Progress Notes (Signed)
Oncology Nurse Navigator Documentation   To provide support, encouragement and care continuity, met with Mr. Aultman for his initial RT.  He was accompanied by his wife. Mr. Barrows completed treatment without difficulty, denied questions/concerns. I reviewed the registration/arrival procedure for subsequent treatments. He is aware that he will have a lab tomorrow, see Dr. Chryl Heck and attend [place]. He is also scheduled for chemotherapy education.  I encouraged them to call me with questions/concerns as treatments proceed.   [name] RN, BSN, OCN Head & Neck Oncology Nurse [place] at [facility] Phone # [phone]  Fax # [phone]

## 2022-10-27 ENCOUNTER — Other Ambulatory Visit: Payer: Self-pay

## 2022-10-27 ENCOUNTER — Ambulatory Visit: Payer: No Typology Code available for payment source | Attending: Radiation Oncology | Admitting: Physical Therapy

## 2022-10-27 ENCOUNTER — Inpatient Hospital Stay (HOSPITAL_BASED_OUTPATIENT_CLINIC_OR_DEPARTMENT_OTHER): Payer: No Typology Code available for payment source | Admitting: Hematology and Oncology

## 2022-10-27 ENCOUNTER — Ambulatory Visit
Admission: RE | Admit: 2022-10-27 | Discharge: 2022-10-27 | Disposition: A | Discharge status: Home / Self Care | Payer: No Typology Code available for payment source | Source: Ambulatory Visit | Attending: Radiation Oncology | Admitting: Radiation Oncology

## 2022-10-27 ENCOUNTER — Encounter: Payer: Self-pay | Admitting: Physical Therapy

## 2022-10-27 ENCOUNTER — Ambulatory Visit: Payer: No Typology Code available for payment source | Attending: Radiation Oncology

## 2022-10-27 ENCOUNTER — Inpatient Hospital Stay: Payer: No Typology Code available for payment source

## 2022-10-27 ENCOUNTER — Encounter: Payer: Self-pay | Admitting: Hematology and Oncology

## 2022-10-27 DIAGNOSIS — R293 Abnormal posture: Secondary | ICD-10-CM | POA: Diagnosis present

## 2022-10-27 DIAGNOSIS — R1310 Dysphagia, unspecified: Secondary | ICD-10-CM | POA: Insufficient documentation

## 2022-10-27 DIAGNOSIS — C320 Malignant neoplasm of glottis: Secondary | ICD-10-CM | POA: Diagnosis present

## 2022-10-27 DIAGNOSIS — R490 Dysphonia: Secondary | ICD-10-CM | POA: Insufficient documentation

## 2022-10-27 DIAGNOSIS — Z510 Encounter for antineoplastic radiation therapy: Secondary | ICD-10-CM | POA: Diagnosis not present

## 2022-10-27 LAB — BASIC METABOLIC PANEL - CANCER CENTER ONLY
Anion gap: 6 (ref 5–15)
BUN: 15 mg/dL (ref 8–23)
CO2: 25 mmol/L (ref 22–32)
Calcium: 9 mg/dL (ref 8.9–10.3)
Chloride: 110 mmol/L (ref 98–111)
Creatinine: 1.25 mg/dL — ABNORMAL HIGH (ref 0.61–1.24)
GFR, Estimated: 58 mL/min — ABNORMAL LOW (ref >60)
Glucose, Bld: 156 mg/dL — ABNORMAL HIGH (ref 70–99)
Potassium: 4 mmol/L (ref 3.5–5.1)
Sodium: 141 mmol/L (ref 135–145)

## 2022-10-27 LAB — RAD ONC ARIA SESSION SUMMARY
Course Elapsed Days: 1
Plan Fractions Treated to Date: 2
Plan Prescribed Dose Per Fraction: 2 Gy
Plan Total Fractions Prescribed: 35
Plan Total Prescribed Dose: 70 Gy
Reference Point Dosage Given to Date: 4 Gy
Reference Point Session Dosage Given: 2 Gy
Session Number: 2

## 2022-10-27 LAB — CBC WITH DIFFERENTIAL (CANCER CENTER ONLY)
Abs Immature Granulocytes: 0.01 10*3/uL (ref 0.00–0.07)
Basophils Absolute: 0 10*3/uL (ref 0.0–0.1)
Basophils Relative: 0 %
Eosinophils Absolute: 0.1 10*3/uL (ref 0.0–0.5)
Eosinophils Relative: 3 %
HCT: 46.7 % (ref 39.0–52.0)
Hemoglobin: 16.1 g/dL (ref 13.0–17.0)
Immature Granulocytes: 0 %
Lymphocytes Relative: 38 %
Lymphs Abs: 1.2 10*3/uL (ref 0.7–4.0)
MCH: 33.3 pg (ref 26.0–34.0)
MCHC: 34.5 g/dL (ref 30.0–36.0)
MCV: 96.5 fL (ref 80.0–100.0)
Monocytes Absolute: 0.3 10*3/uL (ref 0.1–1.0)
Monocytes Relative: 10 %
Neutro Abs: 1.5 10*3/uL — ABNORMAL LOW (ref 1.7–7.7)
Neutrophils Relative %: 49 %
Platelet Count: 105 10*3/uL — ABNORMAL LOW (ref 150–400)
RBC: 4.84 MIL/uL (ref 4.22–5.81)
RDW: 12.9 % (ref 11.5–15.5)
WBC Count: 3.1 10*3/uL — ABNORMAL LOW (ref 4.0–10.5)
nRBC: 0 % (ref 0.0–0.2)

## 2022-10-27 LAB — MAGNESIUM: Magnesium: 2.2 mg/dL (ref 1.7–2.4)

## 2022-10-27 MED FILL — Fosaprepitant Dimeglumine For IV Infusion 150 MG (Base Eq): INTRAVENOUS | Qty: 5 | Status: AC

## 2022-10-27 MED FILL — Dexamethasone Sodium Phosphate Inj 100 MG/10ML: INTRAMUSCULAR | Qty: 1 | Status: AC

## 2022-10-27 NOTE — Patient Instructions (Signed)
SWALLOWING EXERCISES Do these until 6 months after your last day of radiation, then 2-3 times per week afterwards  Effortful Swallows - Press your tongue against the roof of your mouth for 3 seconds, then squeeze the muscles in your neck while you swallow your saliva or a sip of water - Repeat 10-15 times, 2-3 times a day, and use whenever you eat or drink  [place] Swallow - swallow with your tongue sticking out - Stick tongue out past your lips and gently bite tongue with your teeth - Swallow, while holding your tongue with your teeth - Repeat 10-15 times, 2-3 times a day *use a wet spoon if your mouth gets dry*  Pitch Raise - Repeat "he", once per second in as high of a pitch as you can - Repeat 20 times, 2-3 times a day  Shaker Exercise - head lift - Lie flat on your back in your bed or on a couch without pillows - Raise your head and look at your feet - KEEP YOUR SHOULDERS DOWN - HOLD FOR 45-60 SECONDS, then lower your head back down - Repeat 3 times, 2-3 times a day  [employer] - "half swallow" exercise - Start to swallow, and keep your Adam's apple up by squeezing hard with the muscles of the throat - Hold the squeeze for 5-7 seconds and then relax - Repeat 10-15 times, 2-3 times a day *use a wet spoon if your mouth gets dry*        6.   "Super Swallow"  - Take a breath and hold it  - Bear down (like pushing your bowels)  - Swallow then IMMEDIATELY cough  - Repeat 10 times, 2-3 times a day

## 2022-10-27 NOTE — Therapy (Signed)
OUTPATIENT SPEECH LANGUAGE PATHOLOGY ONCOLOGY EVALUATION   Patient Name: Rodney [name] MRN: [id number] DOB:[birth year], 81 y.o., male Today's Date: 10/27/2022  PCP: Jerold Coombe, MD REFERRING PROVIDER: Eppie Gibson, MD  END OF SESSION:  End of Session - 10/27/22 1327     Visit Number 1    Number of Visits 7    Date for SLP Re-Evaluation 01/25/23    SLP Start Time 1015    SLP Stop Time  1055    SLP Time Calculation (min) 40 min    Activity Tolerance Patient tolerated treatment well             Past Medical History:  Diagnosis Date   AAA (abdominal aortic aneurysm) ([place])    stent graft put in 02/2009   Arthritis    CAD (coronary artery disease)    a. cath 2010 b. CAD s/p PCI to ramus and LCX on 03/04/15   Dysrhythmia    Gout    Hyperlipidemia    Hypertension    Paroxysmal atrial fibrillation ([place])    Tobacco abuse    Past Surgical History:  Procedure Laterality Date   ABDOMINAL AORTIC ANEURYSM REPAIR  02/19/2009   performed by [place] N/A 04/28/2017   Procedure: Atrial Fibrillation Ablation;  Surgeon: Constance Haw, MD;  Location: [facility];  Service: Cardiovascular;  Laterality: N/A;   COLONOSCOPY     KNEE ARTHROSCOPY  2003   left   LEFT AND RIGHT HEART CATHETERIZATION WITH CORONARY ANGIOGRAM N/A 03/03/2015   Procedure: LEFT AND RIGHT HEART CATHETERIZATION WITH CORONARY ANGIOGRAM;  Surgeon: Troy Sine, MD;     MICROLARYNGOSCOPY N/A 09/14/2022   Procedure: MICROLARYNGOSCOPY WITH VOCAL CORD BIOPSY;  Surgeon: Izora Gala, MD;  Location: [facility];  Service: ENT;  Laterality: N/A;   PERCUTANEOUS CORONARY STENT INTERVENTION (PCI-S) N/A 03/04/2015   Procedure: PERCUTANEOUS CORONARY STENT INTERVENTION (PCI-S);  Surgeon: Troy Sine, MD; RI 99>>0% w/   BMS, bifurcation CFX-OM 90>>40% w/ Angiosculpt PTCA; dCFX 90>>0% w/   2.7520 mm Rebel BMS   SHOULDER ARTHROSCOPY WITH ROTATOR CUFF REPAIR AND SUBACROMIAL DECOMPRESSION Left  11/19/2013   Procedure: LEFT SHOULDER ARTHROSCOPY DEBRIDEMENT EXTENTSIVE DISTAL CLAVICULECTOMY DECOMPRESSION PARTIAL ACROMIOPLASTY WITH CORACOACROMIAL WITH ROTATOR CUFF REPAIR ;  Surgeon: Renette Butters, MD;  Location: [facility];  Service: Orthopedics;  Laterality: Left;   Patient Active Problem List   Diagnosis Date Noted   Malignant neoplasm of glottis ([place]) 10/12/2022   Palpitations 11/15/2021   Chest pain 07/22/2018   Abnormal EKG 07/22/2018   Nonischemic cardiomyopathy ([place]) 10/17/2017   AF (atrial fibrillation) ([place]) 04/28/2017   Premature ventricular contractions (PVCs) ([facility]) 03/11/2017   H/O endovascular stent graft for abdominal aortic aneurysm 09/01/2016   Cardiomyopathy, ischemic 03/30/2016   OSA (obstructive sleep apnea) 09/30/2015   Encounter for monitoring anti-arrhythmic therapy 05/04/2015   DOE (dyspnea on exertion) 03/16/2015   Atrial fibrillation with RVR ([place]) 03/16/2015   Hypertensive urgency 03/08/2015   Coronary artery disease due to lipid rich plaque    AA (aortic aneurysm) ([place])    Dyspnea 03/02/2015   Progressive angina ([place]) 03/02/2015   History of TIA (transient ischemic attack), 02/20/15 03/02/2015   Mitral regurgitation 03/02/2015   HX: anticoagulation 03/02/2015   Chest tightness    Tobacco abuse    HLD (hyperlipidemia)    TIA (transient ischemic attack) 02/20/2015   Leukopenia 02/20/2015   Chronic combined systolic and diastolic heart failure, NYHA class 1 ([place])  02/20/2015   PAF (paroxysmal atrial fibrillation) ([place]) 01/29/2015   Accelerated hypertension, hx of 62/83/1517   Acute diastolic CHF (congestive heart failure), NYHA class 2 ([place]) 01/03/2015   Demand ischemia ([place]) 01/03/2015   Coronary artery disease involving native coronary artery of native heart without angina pectoris 01/03/2015   Elevated troponin level    Thrombocytopenia ([facility])    Essential hypertension    S/P abdominal aortic aneurysm repair 07/03/2012     ONSET DATE: Sept 2023   REFERRING DIAG:   C32.0 (ICD-10-CM) - Malignant neoplasm of glottis ([place])    THERAPY DIAG:  Dysphagia, unspecified type - Plan: SLP plan of care cert/re-cert  Hoarseness - Plan: SLP plan of care cert/re-cert  Rationale for Evaluation and Treatment: Rehabilitation  SUBJECTIVE:   SUBJECTIVE STATEMENT: Pt denies any swallowing difficulty at present.  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 Presented to PCP at the [place] [facility] in September with progressive hoarseness over the course of 1-2 years. 08/18/22 CT neck showed an irregularity of the anterior aspect of the left vocal cord with apparent extension to the left anterior hypoglottis.  At tumor board today there was also appreciation of paraglottic invasion on the left.  Examination was limited due to motion artifact. Chest CT also performed on that date showed emphysematous changes of the lungs with a few nonspecific bilateral lung nodules (measuring around 2-4 mm). CT also showed nonspecific borderline enlarged right lower paratracheal and AP window lymph nodes measuring up to 1 cm. 08/19/22 He saw Dr. Constance Holster for further evaluation. Indirect exam of the larynx performed revealed a left vocal cord mass. No palpable cervical adenopathy or masses were appreciated. Dr. Constance Holster recommended proceeding with microlaryngoscopy and biopsies of the laryngeal mass. 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  Per op note:"The larynx was normal except for the following: There is a partially exophytic mass involving the left true cord extending superiorly to the floor of the ventricle and inferiorly to the inferior aspect of the cord.  The subglottic larynx was not involved.  The lesion extended from the midportion of the left cord anteriorly to the anterior commissure and there did not appear to be involvement of the anterior  commissure but no involvement of the right cord". 10/12/22 Consult with Dr. Isidore Moos. She plans 35 fractions of radiation. 10/21/22 Consult with Dr. Chryl Heck. She has ordered a PET scan to be completed before final decision of chemotherapy is decided.  09/23/22 PET showed no evidence of metastatic disease. Treatment plan: 35 fractions of radiation to his Larynx and bilateral neck starting 10/26/22.   PAIN:  Are you having pain? No  FALLS: Has patient fallen in last 6 months?  See PT evaluation for details  LIVING ENVIRONMENT: Lives with: lives with their spouse Lives in: House/apartment  PLOF:  Level of assistance: Independent with ADLs Employment: Retired  PATIENT GOALS: Maintain WNL swallowing  OBJECTIVE:   COGNITION: Overall cognitive status: Within functional limits for tasks assessed  LANGUAGE: Receptive and Expressive language appeared WNL.  ORAL MOTOR EXAMINATION: Overall status: WFL  MOTOR SPEECH: Overall motor speech: Appears intact - pt has mod-max hoarseness  CLINICAL SWALLOW ASSESSMENT:   Current diet: regular and thin liquids Dentition: dentures (top) Patient directly observed with POs: Yes: dysphagia 3 (soft) and thin liquids  Feeding: able to feed self Liquids provided by: cup Oral phase signs and symptoms:  none today Pharyngeal phase signs and symptoms:  none today    TODAY'S TREATMENT:                                                                                                                                           10/27/22: Research states the risk for dysphagia increases due to radiation and/or chemotherapy treatment due to a variety of factors, so SLP educated the pt about the possibility of reduced/limited ability for PO intake during rad tx. SLP also educated pt regarding possible changes to swallowing musculature after rad tx, and why adherence to dysphagia HEP provided today and PO consumption was necessary to inhibit muscle fibrosis following rad  tx and to mitigate muscle disuse atrophy. SLP informed pt why this would be detrimental to their swallowing status and to their pulmonary health. Pt demonstrated understanding of these things to SLP. SLP encouraged pt to safely eat and drink as deep into their radiation/chemotherapy as possible to provide the best possible long-term swallowing outcome for pt.    SLP then developed an individualized HEP for pt involving oral and pharyngeal and vocal  strengthening and ROM and pt was instructed how to perform these exercises, including SLP demonstration. After SLP demonstration, pt return demonstrated each exercise. SLP ensured pt performance was correct prior to educating pt on next exercise. Pt required rare min-mod cues faded to modified independent to perform HEP. Pt was instructed to complete this program 6-7 days/week, at least 2 times a day until 6 months after his or her last day of rad tx, and then x2 a week after that, indefinitely. Among other modifications for days when pt cannot functionally swallow, SLP also suggested pt to perform only non-swallowing tasks on the handout/HEP, and if necessary to cycle through the swallowing portion so the full program of exercises can be completed instead of fatiguing on one of the swallowing exercises and being unable to perform the other swallowing exercises. SLP instructed that swallowing exercises should then be added back into the regimen as pt is able to do so. Secondly, pt was told that former patients have told SLP that during their course of radiation therapy, taking prescribed pain medication just prior to performing HEP (and eating/drinking) has proven helpful in completing HEP (and eating and drinking) more regularly when going through their course of radiation treatment.    PATIENT EDUCATION: Education details: late effects head/neck radiation on swallow function, HEP procedure, and modification to HEP when difficulty experienced with swallowing  during and after radiation course Person educated: Patient and Spouse Education method: Explanation, Demonstration, Verbal cues, and Handouts Education comprehension: verbalized understanding, returned demonstration, verbal cues required, and needs further education   ASSESSMENT:  CLINICAL IMPRESSION: Patient is a 81 y.o. male who was seen today for assessment of swallowing as they undergo radiation/chemoradiation therapy. Today pt ate [place] sandwich and drank thin liquids without overt s/s oral or pharyngeal difficulty. At this time  pt swallowing is deemed WNL/WFL with these POs. No oral or overt s/sx pharyngeal deficits, including aspiration were observed. There are no overt s/s aspiration PNA observed by SLP nor any reported by pt at this time. Data indicate that pt's swallow ability will likely decrease over the course of radiation/chemoradiation therapy and could very well decline over time following the conclusion of that therapy due to muscle disuse atrophy and/or muscle fibrosis. Pt will cont to need to be seen by SLP in order to assess safety of PO intake, assess the need for recommending any objective swallow assessment, and ensuring pt is correctly completing the individualized HEP.  OBJECTIVE IMPAIRMENTS: include voice disorder and dysphagia. These impairments are limiting patient from effectively communicating at home and in community and safety when swallowing. Factors affecting potential to achieve goals and functional outcome are  none noted today . Patient will benefit from skilled SLP services to address above impairments and improve overall function.  REHAB POTENTIAL: Excellent   GOALS: Goals reviewed with patient? No  SHORT TERM GOALS: Target: 3rd total session  Pt will compelte HEP with modified independence in 2 sessions Baseline: Goal status: INITIAL  2.  pt will tell SLP why pt is completing HEP with modified independence Baseline:  Goal status: INITIAL  3.  pt  will describe 3 overt s/s aspiration PNA with modified independence Baseline:  Goal status: INITIAL  4.  pt will tell SLP how a food journal could hasten return to a more normalized diet Baseline:  Goal status: INITIAL   LONG TERM GOALS: Target: 7th total session  pt will complete HEP with independence over two visits Baseline:  Goal status: INITIAL  2.  pt will describe how to modify HEP over time, and the timeline associated with reduction in HEP frequency with modified independence over two sessions Baseline:  Goal status: INITIAL   PLAN:  SLP FREQUENCY:  once approx every 4-6 weeks  SLP DURATION:  7 sessions  PLANNED INTERVENTIONS: Aspiration precaution training, Pharyngeal strengthening exercises, Diet toleration management , Environmental controls, Trials of upgraded texture/liquids, SLP instruction and feedback, Compensatory strategies, and Patient/family education    [facility] [place] 10/27/2022, 1:27 PM

## 2022-10-27 NOTE — Progress Notes (Signed)
[facility] CONSULT NOTE  Patient Care Team: Constance Haw, MD as PCP - General (Cardiology) Constance Haw, MD as PCP - Electrophysiology (Cardiology) Nahser, Wonda Cheng, MD (Cardiology) Malmfelt, Stephani Police, RN as Oncology Nurse Navigator Izora Gala, MD as Consulting Physician (Otolaryngology) Eppie Gibson, MD as Consulting Physician (Radiation Oncology)  CHIEF COMPLAINTS/PURPOSE OF CONSULTATION:  SCC glottis  ASSESSMENT & PLAN:   This is a very pleasant 81 year old male patient with history of coronary artery disease, abdominal aortic aneurysm, hypertension, newly diagnosed with SCC of the vocal cord, clinically staged as T3N0, with previous chest CT which showed some nonspecific bilateral lung nodules and nonspecific borderline enlarged lymph nodes referred to medical oncology for additional recommendations. PETCT with no evidence of metastatic disease.  We have today once again discussed about the role of chemotherapy schedule, adverse effects of chemotherapy including but not limited to fatigue, nausea, vomiting, increased risk of infections, ototoxicity, nephrotoxicity and neuropathy.  He is not so sure if he wants to do chemotherapy. He is very worried about the adverse effects. At this time, he wants to proceed with radiation alone.  I think this is very reasonable to proceed with radiation alone given his age and his concern for toxicity with combined modality.  He understands clearly that combined modality may offer him the best outcome. He also has chronic thrombocytopenia, he was encouraged to speak to his PCP after completion of radiation to look into this and he expressed understanding.  HISTORY OF PRESENTING ILLNESS:  Rodney [name] 81 y.o. male is here because of new SCC glottis  Oncologic History  She reported progressive hoarseness over the course of 1-2 yrs, went to Dr Constance Holster for further evaluation. Soft tissue neck CT without contrast on  08/18/22 showed an irregularity of the anterior aspect of the left vocal cord with apparent extension to the left anterior hypoglottis.  At tumor board today there was also appreciation of paraglottic invasion on the left.     Chest CT also performed on that date showed emphysematous changes of the lungs with a few nonspecific bilateral lung nodules (measuring around 2-4 mm). CT also showed nonspecific borderline enlarged right lower paratracheal and AP window lymph nodes measuring up to 1 cm.   Indirect exam of the larynx performed during this visit revealed a left vocal cord mass. No palpable cervical adenopathy or masses were appreciated.  Given these findings, Dr. Constance Holster recommended proceeding with microlaryngoscopy and biopsies of the laryngeal mass.   Biopsies of the left vocal cord mass on 09/14/22 collected by Dr. Constance Holster revealed invasive moderately differentiated squamous cell carcinoma.   He is a retired English as a second language teacher.  He arrived to the appointment today with his wife.  He started radiation yesterday. He is eating well, playing golf.  Rest of the pertinent 10 point ROS reviewed and negative  MEDICAL HISTORY:  Past Medical History:  Diagnosis Date   AAA (abdominal aortic aneurysm) ([place])    stent graft put in 02/2009   Arthritis    CAD (coronary artery disease)    a. cath 2010 b. CAD s/p PCI to ramus and LCX on 03/04/15   Dysrhythmia    Gout    Hyperlipidemia    Hypertension    Paroxysmal atrial fibrillation ([place])    Tobacco abuse     SURGICAL HISTORY: Past Surgical History:  Procedure Laterality Date   ABDOMINAL AORTIC ANEURYSM REPAIR  02/19/2009   performed by [facility] N/A 04/28/2017  Procedure: Atrial Fibrillation Ablation;  Surgeon: Constance Haw, MD;  Location: [facility];  Service: Cardiovascular;  Laterality: N/A;   COLONOSCOPY     KNEE ARTHROSCOPY  2003   left   LEFT AND RIGHT HEART CATHETERIZATION WITH CORONARY ANGIOGRAM N/A  03/03/2015   Procedure: LEFT AND RIGHT HEART CATHETERIZATION WITH CORONARY ANGIOGRAM;  Surgeon: Troy Sine, MD;     MICROLARYNGOSCOPY N/A 09/14/2022   Procedure: MICROLARYNGOSCOPY WITH VOCAL CORD BIOPSY;  Surgeon: Izora Gala, MD;  Location: [facility];  Service: ENT;  Laterality: N/A;   PERCUTANEOUS CORONARY STENT INTERVENTION (PCI-S) N/A 03/04/2015   Procedure: PERCUTANEOUS CORONARY STENT INTERVENTION (PCI-S);  Surgeon: Troy Sine, MD; RI 99>>0% w/   BMS, bifurcation CFX-OM 90>>40% w/ Angiosculpt PTCA; dCFX 90>>0% w/   2.7520 mm Rebel BMS   SHOULDER ARTHROSCOPY WITH ROTATOR CUFF REPAIR AND SUBACROMIAL DECOMPRESSION Left 11/19/2013   Procedure: LEFT SHOULDER ARTHROSCOPY DEBRIDEMENT EXTENTSIVE DISTAL CLAVICULECTOMY DECOMPRESSION PARTIAL ACROMIOPLASTY WITH CORACOACROMIAL WITH ROTATOR CUFF REPAIR ;  Surgeon: Renette Butters, MD;  Location: [facility];  Service: Orthopedics;  Laterality: Left;    SOCIAL HISTORY: Social History   Socioeconomic History   Marital status: Married    Spouse name: Not on file   Number of children: Not on file   Years of education: Not on file   Highest education level: Not on file  Occupational History   Not on file  Tobacco Use   Smoking status: Former    Packs/day: 0.25    Years: 50.00    Total pack years: 12.50    Types: Cigarettes   Smokeless tobacco: Never  Substance and Sexual Activity   Alcohol use: Yes    Alcohol/week: 8.0 standard drinks of alcohol    Types: 1 Cans of beer, 7 Shots of liquor per week   Drug use: No   Sexual activity: Not Currently    Comment: cutting down 1/2 ppd  Other Topics Concern   Not on file  Social History Narrative   Lives with wife, does not use assist, drives.  No home health services.     Social Determinants of Health   Financial Resource Strain: Not on file  Food Insecurity: Not on file  Transportation Needs: Not on file  Physical Activity: Not on file  Stress: Not on file  Social  Connections: Not on file  Intimate Partner Violence: Not on file    FAMILY HISTORY: Family History  Problem Relation Age of Onset   Prostate cancer Father    Hypertension Mother    Diabetes Sister    Hypertension Sister    Stroke Neg Hx    Heart attack Neg Hx     ALLERGIES:  is allergic to lisinopril and iohexol.  MEDICATIONS:  Current Outpatient Medications  Medication Sig Dispense Refill   amLODipine (NORVASC) 5 MG tablet Take 1 tablet (5 mg total) daily by mouth. 90 tablet 3   apixaban (ELIQUIS) 5 MG TABS tablet Take 1 tablet (5 mg total) by mouth 2 (two) times daily. 180 tablet 3   aspirin EC 81 MG tablet Take 81 mg by mouth daily.     atorvastatin (LIPITOR) 80 MG tablet Take 1 tablet (80 mg total) by mouth daily. (Patient taking differently: Take 80 mg by mouth every evening.) 90 tablet 3   carvedilol (COREG) 25 MG tablet Take 12.5 mg by mouth 2 (two) times daily with a meal.     diltiazem (CARDIZEM) 30 MG tablet TAKE 1  TABLET BY MOUTH FOUR TIMES DAILY AS NEEDED FOR IRREGULAR HEART RATE GREATER THAN 100 AND BLOOD PRESSURE GREATER THAN 100 45 tablet 6   famotidine (PEPCID) 40 MG tablet Take 1 tablet (40 mg total) by mouth 2 (two) times daily. 180 tablet 2   fish oil-omega-3 fatty acids 1000 MG capsule Take 1,000 mg by mouth in the morning.     furosemide (LASIX) 20 MG tablet Take 20 mg by mouth in the morning.     irbesartan (AVAPRO) 300 MG tablet TAKE 1/2 TABLET(150 MG) BY MOUTH DAILY 90 tablet 3   isosorbide mononitrate (IMDUR) 60 MG 24 hr tablet TAKE 1 TABLET BY MOUTH EVERY DAY ( CAN NOT BE FILL 06.04.2018) 90 tablet 2   KLOR-CON M10 10 MEQ tablet TAKE 1 TABLET BY MOUTH TWICE A DAY 180 tablet 3   loratadine (CLARITIN) 10 MG tablet Take 10 mg by mouth in the morning.     Multiple Vitamin (MULTIVITAMIN WITH MINERALS) TABS tablet Take 1 tablet by mouth daily.     nicotine (NICODERM CQ - DOSED IN MG/24 HOURS) 14 mg/24hr patch Place 1 patch onto the skin daily.     nitroGLYCERIN  (NITROSTAT) 0.4 MG SL tablet DISSOLVE 1 TABLET UNDER THE TONGUE EVERY 5 MINUTES AS NEEDED FOR CHEST PAIN( SHORTNESS OF BREATH) 25 tablet 3   omeprazole (PRILOSEC) 20 MG capsule Take 20 mg by mouth daily before breakfast.     No current facility-administered medications for this visit.     PHYSICAL EXAMINATION: ECOG PERFORMANCE STATUS: 0 - Asymptomatic  Vitals:   10/27/22 0822  BP: 119/78  Pulse: 89  Resp: 17  Temp: (!) 97.2 F (36.2 C)  SpO2: 100%   Filed Weights   10/27/22 0822  Weight: 212 lb (96.2 kg)   Physical Exam Constitutional:      Appearance: Normal appearance.  Neck:     Comments: No palpable lymphadenopathy in the neck Cardiovascular:     Rate and Rhythm: Normal rate and regular rhythm.  Pulmonary:     Effort: Pulmonary effort is normal.     Breath sounds: Normal breath sounds.  Musculoskeletal:        General: No swelling. Normal range of motion.     Cervical back: Normal range of motion and neck supple. No rigidity.  Lymphadenopathy:     Cervical: No cervical adenopathy.  Neurological:     Mental Status: He is alert.      LABORATORY DATA:  I have reviewed the data as listed Lab Results  Component Value Date   WBC 3.1 (L) 10/27/2022   HGB 16.1 10/27/2022   HCT 46.7 10/27/2022   MCV 96.5 10/27/2022   PLT 105 (L) 10/27/2022     Chemistry      Component Value Date/Time   NA 142 10/21/2022 1059   NA 142 09/11/2020 0938   K 4.1 10/21/2022 1059   CL 110 10/21/2022 1059   CO2 28 10/21/2022 1059   BUN 13 10/21/2022 1059   BUN 12 09/11/2020 0938   CREATININE 1.18 10/21/2022 1059   CREATININE 1.28 (H) 03/30/2016 1416      Component Value Date/Time   CALCIUM 8.7 (L) 10/21/2022 1059   ALKPHOS 89 10/21/2022 1059   AST 22 10/21/2022 1059   ALT 28 10/21/2022 1059   BILITOT 0.7 10/21/2022 1059       RADIOGRAPHIC STUDIES: I have personally reviewed the radiological images as listed and agreed with the findings in the report. NM PET Image  Initial (PI) Skull Base To Thigh  Result Date: 10/26/2022 CLINICAL DATA:  Initial treatment strategy for head neck carcinoma. EXAM: NUCLEAR MEDICINE PET SKULL BASE TO THIGH TECHNIQUE: 11.9 mCi F-18 FDG was injected intravenously. Full-ring PET imaging was performed from the skull base to thigh after the radiotracer. CT data was obtained and used for attenuation correction and anatomic localization. Fasting blood glucose: 100 mg/dl COMPARISON:  None available at FINDINGS: Mediastinal blood pool activity: SUV max 2.5 Liver activity: SUV max NA NECK: Focal hypermetabolic activity in the midline anterior aspect of the larynx localizing slightly to the LEFT vocal cord (image 67). Activity is intense with SUV max equal 11.8. No hypermetabolic or enlarged lymph nodes within the neck. No hypermetabolic supraclavicular nodes. Incidental CT findings: None. CHEST: No hypermetabolic mediastinal or hilar nodes. No suspicious pulmonary nodules on the CT scan. Incidental CT findings: Coronary artery calcification and aortic atherosclerotic calcification. ABDOMEN/PELVIS: No abnormal hypermetabolic activity within the liver, pancreas, adrenal glands, or spleen. No hypermetabolic lymph nodes in the abdomen or pelvis. Incidental CT findings: Infrarenal abdominal aortic stent graft noted. SKELETON: No focal hypermetabolic activity to suggest skeletal metastasis. Incidental CT findings: None. IMPRESSION: 1. Focal hypermetabolic activity localizing to the anterior aspect of the LEFT focal cord consistent primary laryngeal carcinoma. 2. No metastatic adenopathy within the neck or chest. 3. No distant metastatic disease. Electronically Signed   By: Suzy Bouchard M.D.   On: 10/26/2022 14:56    All questions were answered. The patient knows to call the clinic with any problems, questions or concerns. I spent 30 minutes in the care of this patient including H and P, review of records, counseling and coordination of care.      Benay Pike, MD 10/27/2022 8:43 AM

## 2022-10-27 NOTE — Progress Notes (Signed)
Oncology Nurse Navigator Documentation   I met with Rodney [name] and his wife before his scheduled appointments with PT and SLP today during head and neck clinic. He is tolerating treatment well and has made the decision to not receive chemotherapy. He and his wife know to call me with any questions they may have as his treatment progresses.   [name] RN, BSN, OCN Head & Neck Oncology Nurse [place] at [facility] Phone # [phone]  Fax # [phone]

## 2022-10-28 ENCOUNTER — Ambulatory Visit
Admission: RE | Admit: 2022-10-28 | Discharge: 2022-10-28 | Disposition: A | Discharge status: Home / Self Care | Payer: No Typology Code available for payment source | Source: Ambulatory Visit | Attending: Radiation Oncology | Admitting: Radiation Oncology

## 2022-10-28 ENCOUNTER — Inpatient Hospital Stay: Payer: No Typology Code available for payment source

## 2022-10-28 ENCOUNTER — Other Ambulatory Visit: Payer: Self-pay

## 2022-10-28 DIAGNOSIS — Z510 Encounter for antineoplastic radiation therapy: Secondary | ICD-10-CM | POA: Diagnosis not present

## 2022-10-28 LAB — RAD ONC ARIA SESSION SUMMARY
Course Elapsed Days: 2
Plan Fractions Treated to Date: 3
Plan Prescribed Dose Per Fraction: 2 Gy
Plan Total Fractions Prescribed: 35
Plan Total Prescribed Dose: 70 Gy
Reference Point Dosage Given to Date: 6 Gy
Reference Point Session Dosage Given: 2 Gy
Session Number: 3

## 2022-10-29 ENCOUNTER — Other Ambulatory Visit: Payer: Self-pay

## 2022-10-31 ENCOUNTER — Other Ambulatory Visit: Payer: Self-pay

## 2022-10-31 ENCOUNTER — Telehealth: Payer: No Typology Code available for payment source | Admitting: Hematology and Oncology

## 2022-10-31 ENCOUNTER — Ambulatory Visit
Admission: RE | Admit: 2022-10-31 | Discharge: 2022-10-31 | Disposition: A | Discharge status: Home / Self Care | Payer: No Typology Code available for payment source | Source: Ambulatory Visit | Attending: Radiation Oncology | Admitting: Radiation Oncology

## 2022-10-31 ENCOUNTER — Other Ambulatory Visit: Payer: Self-pay | Admitting: Radiation Oncology

## 2022-10-31 ENCOUNTER — Ambulatory Visit: Payer: No Typology Code available for payment source

## 2022-10-31 DIAGNOSIS — Z510 Encounter for antineoplastic radiation therapy: Secondary | ICD-10-CM | POA: Diagnosis not present

## 2022-10-31 DIAGNOSIS — C320 Malignant neoplasm of glottis: Secondary | ICD-10-CM

## 2022-10-31 LAB — RAD ONC ARIA SESSION SUMMARY
Course Elapsed Days: 5
Plan Fractions Treated to Date: 4
Plan Prescribed Dose Per Fraction: 2 Gy
Plan Total Fractions Prescribed: 35
Plan Total Prescribed Dose: 70 Gy
Reference Point Dosage Given to Date: 8 Gy
Reference Point Session Dosage Given: 2 Gy
Session Number: 4

## 2022-10-31 MED ORDER — LIDOCAINE VISCOUS HCL 2 % MT SOLN: OROMUCOSAL | 3 refills | Status: DC

## 2022-10-31 MED ORDER — SONAFINE EX EMUL: 1.0000 | Freq: Two times a day (BID) | CUTANEOUS | Status: DC

## 2022-11-01 ENCOUNTER — Ambulatory Visit (HOSPITAL_COMMUNITY): Payer: No Typology Code available for payment source

## 2022-11-01 ENCOUNTER — Ambulatory Visit
Admission: RE | Admit: 2022-11-01 | Discharge: 2022-11-01 | Disposition: A | Discharge status: Home / Self Care | Payer: No Typology Code available for payment source | Source: Ambulatory Visit | Attending: Radiation Oncology | Admitting: Radiation Oncology

## 2022-11-01 ENCOUNTER — Other Ambulatory Visit: Payer: Self-pay

## 2022-11-01 DIAGNOSIS — C320 Malignant neoplasm of glottis: Secondary | ICD-10-CM

## 2022-11-01 DIAGNOSIS — Z510 Encounter for antineoplastic radiation therapy: Secondary | ICD-10-CM | POA: Diagnosis not present

## 2022-11-01 LAB — RAD ONC ARIA SESSION SUMMARY
Course Elapsed Days: 6
Plan Fractions Treated to Date: 5
Plan Prescribed Dose Per Fraction: 2 Gy
Plan Total Fractions Prescribed: 35
Plan Total Prescribed Dose: 70 Gy
Reference Point Dosage Given to Date: 10 Gy
Reference Point Session Dosage Given: 2 Gy
Session Number: 5

## 2022-11-02 ENCOUNTER — Inpatient Hospital Stay: Payer: No Typology Code available for payment source

## 2022-11-02 ENCOUNTER — Inpatient Hospital Stay: Payer: No Typology Code available for payment source | Admitting: Hematology and Oncology

## 2022-11-02 ENCOUNTER — Other Ambulatory Visit: Payer: Self-pay

## 2022-11-02 ENCOUNTER — Ambulatory Visit
Admission: RE | Admit: 2022-11-02 | Discharge: 2022-11-02 | Disposition: A | Discharge status: Home / Self Care | Payer: No Typology Code available for payment source | Source: Ambulatory Visit | Attending: Radiation Oncology | Admitting: Radiation Oncology

## 2022-11-02 DIAGNOSIS — Z510 Encounter for antineoplastic radiation therapy: Secondary | ICD-10-CM | POA: Diagnosis not present

## 2022-11-02 LAB — RAD ONC ARIA SESSION SUMMARY
Course Elapsed Days: 7
Plan Fractions Treated to Date: 6
Plan Prescribed Dose Per Fraction: 2 Gy
Plan Total Fractions Prescribed: 35
Plan Total Prescribed Dose: 70 Gy
Reference Point Dosage Given to Date: 12 Gy
Reference Point Session Dosage Given: 2 Gy
Session Number: 6

## 2022-11-04 ENCOUNTER — Inpatient Hospital Stay: Payer: No Typology Code available for payment source

## 2022-11-07 ENCOUNTER — Other Ambulatory Visit: Payer: Self-pay

## 2022-11-07 ENCOUNTER — Ambulatory Visit
Admission: RE | Admit: 2022-11-07 | Discharge: 2022-11-07 | Disposition: A | Discharge status: Home / Self Care | Payer: No Typology Code available for payment source | Source: Ambulatory Visit | Attending: Radiation Oncology | Admitting: Radiation Oncology

## 2022-11-07 DIAGNOSIS — Z510 Encounter for antineoplastic radiation therapy: Secondary | ICD-10-CM | POA: Diagnosis not present

## 2022-11-07 LAB — RAD ONC ARIA SESSION SUMMARY
Course Elapsed Days: 12
Plan Fractions Treated to Date: 7
Plan Prescribed Dose Per Fraction: 2 Gy
Plan Total Fractions Prescribed: 35
Plan Total Prescribed Dose: 70 Gy
Reference Point Dosage Given to Date: 14 Gy
Reference Point Session Dosage Given: 2 Gy
Session Number: 7

## 2022-11-08 ENCOUNTER — Ambulatory Visit
Admission: RE | Admit: 2022-11-08 | Discharge: 2022-11-08 | Disposition: A | Discharge status: Home / Self Care | Payer: No Typology Code available for payment source | Source: Ambulatory Visit | Attending: Radiation Oncology | Admitting: Radiation Oncology

## 2022-11-08 ENCOUNTER — Other Ambulatory Visit: Payer: Self-pay

## 2022-11-08 DIAGNOSIS — Z510 Encounter for antineoplastic radiation therapy: Secondary | ICD-10-CM | POA: Diagnosis not present

## 2022-11-08 LAB — RAD ONC ARIA SESSION SUMMARY
Course Elapsed Days: 13
Plan Fractions Treated to Date: 8
Plan Prescribed Dose Per Fraction: 2 Gy
Plan Total Fractions Prescribed: 35
Plan Total Prescribed Dose: 70 Gy
Reference Point Dosage Given to Date: 16 Gy
Reference Point Session Dosage Given: 2 Gy
Session Number: 8

## 2022-11-09 ENCOUNTER — Other Ambulatory Visit: Payer: Self-pay

## 2022-11-09 ENCOUNTER — Ambulatory Visit
Admission: RE | Admit: 2022-11-09 | Discharge: 2022-11-09 | Disposition: A | Discharge status: Home / Self Care | Payer: No Typology Code available for payment source | Source: Ambulatory Visit | Attending: Radiation Oncology | Admitting: Radiation Oncology

## 2022-11-09 DIAGNOSIS — Z510 Encounter for antineoplastic radiation therapy: Secondary | ICD-10-CM | POA: Diagnosis not present

## 2022-11-09 LAB — RAD ONC ARIA SESSION SUMMARY
Course Elapsed Days: 14
Plan Fractions Treated to Date: 9
Plan Prescribed Dose Per Fraction: 2 Gy
Plan Total Fractions Prescribed: 35
Plan Total Prescribed Dose: 70 Gy
Reference Point Dosage Given to Date: 18 Gy
Reference Point Session Dosage Given: 2 Gy
Session Number: 9

## 2022-11-10 ENCOUNTER — Ambulatory Visit
Admission: RE | Admit: 2022-11-10 | Discharge: 2022-11-10 | Disposition: A | Discharge status: Home / Self Care | Payer: No Typology Code available for payment source | Source: Ambulatory Visit | Attending: Radiation Oncology | Admitting: Radiation Oncology

## 2022-11-10 ENCOUNTER — Other Ambulatory Visit: Payer: Self-pay

## 2022-11-10 DIAGNOSIS — Z510 Encounter for antineoplastic radiation therapy: Secondary | ICD-10-CM | POA: Diagnosis not present

## 2022-11-10 LAB — RAD ONC ARIA SESSION SUMMARY
Course Elapsed Days: 15
Plan Fractions Treated to Date: 10
Plan Prescribed Dose Per Fraction: 2 Gy
Plan Total Fractions Prescribed: 35
Plan Total Prescribed Dose: 70 Gy
Reference Point Dosage Given to Date: 20 Gy
Reference Point Session Dosage Given: 2 Gy
Session Number: 10

## 2022-11-11 ENCOUNTER — Other Ambulatory Visit: Payer: Self-pay

## 2022-11-11 ENCOUNTER — Inpatient Hospital Stay: Payer: No Typology Code available for payment source | Attending: Hematology and Oncology | Admitting: Dietician

## 2022-11-11 ENCOUNTER — Ambulatory Visit
Admission: RE | Admit: 2022-11-11 | Discharge: 2022-11-11 | Disposition: A | Discharge status: Home / Self Care | Payer: No Typology Code available for payment source | Source: Ambulatory Visit | Attending: Radiation Oncology | Admitting: Radiation Oncology

## 2022-11-11 DIAGNOSIS — C320 Malignant neoplasm of glottis: Secondary | ICD-10-CM | POA: Insufficient documentation

## 2022-11-11 LAB — RAD ONC ARIA SESSION SUMMARY
Course Elapsed Days: 16
Plan Fractions Treated to Date: 11
Plan Prescribed Dose Per Fraction: 2 Gy
Plan Total Fractions Prescribed: 35
Plan Total Prescribed Dose: 70 Gy
Reference Point Dosage Given to Date: 22 Gy
Reference Point Session Dosage Given: 2 Gy
Session Number: 11

## 2022-11-11 NOTE — Progress Notes (Signed)
Nutrition Follow-up:  Patient with glottis cancer. He is receiving radiation therapy under the care of Dr. Isidore Moos. He has completed 11 of 35 planned fractions.   Met with patient and wife after therapy. Patient reports mild sore throat. He denies pain with swallowing. Patient reports thick saliva. He is doing baking soda salt water rinses several times daily. Appetite has not been as good the last couple of days. He has tried samples of Ensure. These are okay and can drink them if he needs to. Patient always eats a good breakfast. Recalls steak and eggs with coffee and water yesterday. He had 7-8 peanut butter crackers for lunch, and pizza for dinner. Patient ate bowl of sherbet for dessert. He drinks plenty of water. Patient is active. He is looking forward to playing golf this weekend.     Medications: reviewed  Labs: no new labs for review  Anthropometrics: Wt 215.4 lb on 11/27 stable  11/20 - 215.2 lb    NUTRITION DIAGNOSIS: Predicted suboptimal intake stable   INTERVENTION:  Encouraged high calorie high protein foods for weight maintenance Suggested drinking one Ensure Plus/equivalent and increasing as needed with decreased appetite Continue baking soda salt water rinses Suggested ginger ale rinse for thick saliva    MONITORING, EVALUATION, GOAL: weight trends, intake  NEXT VISIT: Friday December 8 after radiation

## 2022-11-14 ENCOUNTER — Other Ambulatory Visit: Payer: Self-pay

## 2022-11-14 ENCOUNTER — Ambulatory Visit
Admission: RE | Admit: 2022-11-14 | Discharge: 2022-11-14 | Disposition: A | Discharge status: Home / Self Care | Payer: No Typology Code available for payment source | Source: Ambulatory Visit | Attending: Radiation Oncology | Admitting: Radiation Oncology

## 2022-11-14 DIAGNOSIS — C320 Malignant neoplasm of glottis: Secondary | ICD-10-CM | POA: Diagnosis not present

## 2022-11-14 LAB — RAD ONC ARIA SESSION SUMMARY
Course Elapsed Days: 19
Plan Fractions Treated to Date: 12
Plan Prescribed Dose Per Fraction: 2 Gy
Plan Total Fractions Prescribed: 35
Plan Total Prescribed Dose: 70 Gy
Reference Point Dosage Given to Date: 24 Gy
Reference Point Session Dosage Given: 2 Gy
Session Number: 12

## 2022-11-15 ENCOUNTER — Other Ambulatory Visit: Payer: Self-pay

## 2022-11-15 ENCOUNTER — Ambulatory Visit
Admission: RE | Admit: 2022-11-15 | Discharge: 2022-11-15 | Disposition: A | Discharge status: Home / Self Care | Payer: No Typology Code available for payment source | Source: Ambulatory Visit | Attending: Radiation Oncology | Admitting: Radiation Oncology

## 2022-11-15 DIAGNOSIS — C320 Malignant neoplasm of glottis: Secondary | ICD-10-CM | POA: Diagnosis not present

## 2022-11-15 LAB — RAD ONC ARIA SESSION SUMMARY
Course Elapsed Days: 20
Plan Fractions Treated to Date: 13
Plan Prescribed Dose Per Fraction: 2 Gy
Plan Total Fractions Prescribed: 35
Plan Total Prescribed Dose: 70 Gy
Reference Point Dosage Given to Date: 26 Gy
Reference Point Session Dosage Given: 2 Gy
Session Number: 13

## 2022-11-16 ENCOUNTER — Other Ambulatory Visit: Payer: Self-pay

## 2022-11-16 ENCOUNTER — Ambulatory Visit
Admission: RE | Admit: 2022-11-16 | Discharge: 2022-11-16 | Disposition: A | Discharge status: Home / Self Care | Payer: No Typology Code available for payment source | Source: Ambulatory Visit | Attending: Radiation Oncology | Admitting: Radiation Oncology

## 2022-11-16 DIAGNOSIS — C320 Malignant neoplasm of glottis: Secondary | ICD-10-CM | POA: Diagnosis not present

## 2022-11-16 LAB — RAD ONC ARIA SESSION SUMMARY
Course Elapsed Days: 21
Plan Fractions Treated to Date: 14
Plan Prescribed Dose Per Fraction: 2 Gy
Plan Total Fractions Prescribed: 35
Plan Total Prescribed Dose: 70 Gy
Reference Point Dosage Given to Date: 28 Gy
Reference Point Session Dosage Given: 2 Gy
Session Number: 14

## 2022-11-17 ENCOUNTER — Other Ambulatory Visit: Payer: Self-pay

## 2022-11-17 ENCOUNTER — Ambulatory Visit
Admission: RE | Admit: 2022-11-17 | Discharge: 2022-11-17 | Disposition: A | Discharge status: Home / Self Care | Payer: No Typology Code available for payment source | Source: Ambulatory Visit | Attending: Radiation Oncology | Admitting: Radiation Oncology

## 2022-11-17 DIAGNOSIS — C320 Malignant neoplasm of glottis: Secondary | ICD-10-CM | POA: Diagnosis not present

## 2022-11-17 LAB — RAD ONC ARIA SESSION SUMMARY
Course Elapsed Days: 22
Plan Fractions Treated to Date: 15
Plan Prescribed Dose Per Fraction: 2 Gy
Plan Total Fractions Prescribed: 35
Plan Total Prescribed Dose: 70 Gy
Reference Point Dosage Given to Date: 30 Gy
Reference Point Session Dosage Given: 2 Gy
Session Number: 15

## 2022-11-18 ENCOUNTER — Ambulatory Visit
Admission: RE | Admit: 2022-11-18 | Discharge: 2022-11-18 | Disposition: A | Discharge status: Home / Self Care | Payer: No Typology Code available for payment source | Source: Ambulatory Visit | Attending: Radiation Oncology | Admitting: Radiation Oncology

## 2022-11-18 ENCOUNTER — Other Ambulatory Visit: Payer: Self-pay

## 2022-11-18 ENCOUNTER — Inpatient Hospital Stay: Payer: No Typology Code available for payment source | Admitting: Dietician

## 2022-11-18 DIAGNOSIS — C320 Malignant neoplasm of glottis: Secondary | ICD-10-CM | POA: Diagnosis not present

## 2022-11-18 LAB — RAD ONC ARIA SESSION SUMMARY
Course Elapsed Days: 23
Plan Fractions Treated to Date: 16
Plan Prescribed Dose Per Fraction: 2 Gy
Plan Total Fractions Prescribed: 35
Plan Total Prescribed Dose: 70 Gy
Reference Point Dosage Given to Date: 32 Gy
Reference Point Session Dosage Given: 2 Gy
Session Number: 16

## 2022-11-18 NOTE — Progress Notes (Signed)
Nutrition Follow-up:  Patient with glottis cancer. He is receiving radiation therapy under the care of Dr. Isidore Moos. He has completed 16 of 35 planned fractions.   Patient did not show for nutrition follow-up today.   Anthropometrics: Last aria wt 216.6 lb on 12/4 stable  11/27 - 215.4 lb  11/20 - 215.2 lb    NUTRITION DIAGNOSIS: Predicted suboptimal intake appears stable   MONITORING, EVALUATION, GOAL: weight trends, intake   NEXT VISIT: Friday December 15 after radiation

## 2022-11-21 ENCOUNTER — Ambulatory Visit
Admission: RE | Admit: 2022-11-21 | Discharge: 2022-11-21 | Disposition: A | Discharge status: Home / Self Care | Payer: No Typology Code available for payment source | Source: Ambulatory Visit | Attending: Radiation Oncology | Admitting: Radiation Oncology

## 2022-11-21 ENCOUNTER — Other Ambulatory Visit: Payer: Self-pay

## 2022-11-21 DIAGNOSIS — C320 Malignant neoplasm of glottis: Secondary | ICD-10-CM | POA: Diagnosis not present

## 2022-11-21 LAB — RAD ONC ARIA SESSION SUMMARY
Course Elapsed Days: 26
Plan Fractions Treated to Date: 17
Plan Prescribed Dose Per Fraction: 2 Gy
Plan Total Fractions Prescribed: 35
Plan Total Prescribed Dose: 70 Gy
Reference Point Dosage Given to Date: 34 Gy
Reference Point Session Dosage Given: 2 Gy
Session Number: 17

## 2022-11-22 ENCOUNTER — Ambulatory Visit
Admission: RE | Admit: 2022-11-22 | Discharge: 2022-11-22 | Disposition: A | Discharge status: Home / Self Care | Payer: No Typology Code available for payment source | Source: Ambulatory Visit | Attending: Radiation Oncology | Admitting: Radiation Oncology

## 2022-11-22 ENCOUNTER — Other Ambulatory Visit: Payer: Self-pay

## 2022-11-22 DIAGNOSIS — C320 Malignant neoplasm of glottis: Secondary | ICD-10-CM | POA: Diagnosis not present

## 2022-11-22 LAB — RAD ONC ARIA SESSION SUMMARY
Course Elapsed Days: 27
Plan Fractions Treated to Date: 18
Plan Prescribed Dose Per Fraction: 2 Gy
Plan Total Fractions Prescribed: 35
Plan Total Prescribed Dose: 70 Gy
Reference Point Dosage Given to Date: 36 Gy
Reference Point Session Dosage Given: 2 Gy
Session Number: 18

## 2022-11-23 ENCOUNTER — Ambulatory Visit
Admission: RE | Admit: 2022-11-23 | Discharge: 2022-11-23 | Disposition: A | Discharge status: Home / Self Care | Payer: No Typology Code available for payment source | Source: Ambulatory Visit | Attending: Radiation Oncology | Admitting: Radiation Oncology

## 2022-11-23 ENCOUNTER — Other Ambulatory Visit: Payer: Self-pay

## 2022-11-23 DIAGNOSIS — C320 Malignant neoplasm of glottis: Secondary | ICD-10-CM | POA: Diagnosis not present

## 2022-11-23 LAB — RAD ONC ARIA SESSION SUMMARY
Course Elapsed Days: 28
Plan Fractions Treated to Date: 19
Plan Prescribed Dose Per Fraction: 2 Gy
Plan Total Fractions Prescribed: 35
Plan Total Prescribed Dose: 70 Gy
Reference Point Dosage Given to Date: 38 Gy
Reference Point Session Dosage Given: 2 Gy
Session Number: 19

## 2022-11-24 ENCOUNTER — Ambulatory Visit
Admission: RE | Admit: 2022-11-24 | Discharge: 2022-11-24 | Disposition: A | Discharge status: Home / Self Care | Payer: No Typology Code available for payment source | Source: Ambulatory Visit | Attending: Radiation Oncology | Admitting: Radiation Oncology

## 2022-11-24 ENCOUNTER — Other Ambulatory Visit: Payer: Self-pay

## 2022-11-24 DIAGNOSIS — C320 Malignant neoplasm of glottis: Secondary | ICD-10-CM | POA: Diagnosis not present

## 2022-11-24 LAB — RAD ONC ARIA SESSION SUMMARY
Course Elapsed Days: 29
Plan Fractions Treated to Date: 20
Plan Prescribed Dose Per Fraction: 2 Gy
Plan Total Fractions Prescribed: 35
Plan Total Prescribed Dose: 70 Gy
Reference Point Dosage Given to Date: 40 Gy
Reference Point Session Dosage Given: 2 Gy
Session Number: 20

## 2022-11-25 ENCOUNTER — Ambulatory Visit
Admission: RE | Admit: 2022-11-25 | Discharge: 2022-11-25 | Disposition: A | Discharge status: Home / Self Care | Payer: No Typology Code available for payment source | Source: Ambulatory Visit | Attending: Radiation Oncology | Admitting: Radiation Oncology

## 2022-11-25 ENCOUNTER — Inpatient Hospital Stay: Payer: No Typology Code available for payment source | Admitting: Dietician

## 2022-11-25 ENCOUNTER — Other Ambulatory Visit: Payer: Self-pay

## 2022-11-25 DIAGNOSIS — C320 Malignant neoplasm of glottis: Secondary | ICD-10-CM | POA: Diagnosis not present

## 2022-11-25 LAB — RAD ONC ARIA SESSION SUMMARY
Course Elapsed Days: 30
Plan Fractions Treated to Date: 21
Plan Prescribed Dose Per Fraction: 2 Gy
Plan Total Fractions Prescribed: 35
Plan Total Prescribed Dose: 70 Gy
Reference Point Dosage Given to Date: 42 Gy
Reference Point Session Dosage Given: 2 Gy
Session Number: 21

## 2022-11-25 NOTE — Progress Notes (Signed)
Nutrition Follow-up:  Patient with glottis cancer. He is receiving radiation therapy under the care of Dr. Isidore Moos. He has completed 21 of 35 planned fractions.  RD working remotely.  Spoke with patient via telephone. He reports mild sore throat. He has not needed to use pain medication. Patient has thick saliva. He is doing baking soda salt water rinses as well as Biotene. Patient reports bland taste of foods. He is eating breakfast at time of call (grits, eggs, toast, bacon, coffee). Patient is supplementing with Ensure twice daily. Patient denies swallowing difficulties. He is drinking quite a bit of water.   Medications: reviewed   Labs: no new labs  Anthropometrics: Last aria wt 214. 6 lb on 12/11 decreased  12/4 - 216.6 lb 11/27 - 215.4 lb   NUTRITION DIAGNOSIS: Predicted suboptimal intake ongoing  INTERVENTION:  Encouraged soft moist high protein foods Continue drinking Ensure Plus/equivalent, recommend 2-3 day Continue baking soda salt water rinses     MONITORING, EVALUATION, GOAL: weight trends, intake   NEXT VISIT: Friday December 22 after radiation

## 2022-11-28 ENCOUNTER — Other Ambulatory Visit: Payer: Self-pay

## 2022-11-28 ENCOUNTER — Ambulatory Visit
Admission: RE | Admit: 2022-11-28 | Discharge: 2022-11-28 | Disposition: A | Discharge status: Home / Self Care | Payer: No Typology Code available for payment source | Source: Ambulatory Visit | Attending: Radiation Oncology | Admitting: Radiation Oncology

## 2022-11-28 DIAGNOSIS — C320 Malignant neoplasm of glottis: Secondary | ICD-10-CM | POA: Diagnosis not present

## 2022-11-28 LAB — RAD ONC ARIA SESSION SUMMARY
Course Elapsed Days: 33
Plan Fractions Treated to Date: 22
Plan Prescribed Dose Per Fraction: 2 Gy
Plan Total Fractions Prescribed: 35
Plan Total Prescribed Dose: 70 Gy
Reference Point Dosage Given to Date: 44 Gy
Reference Point Session Dosage Given: 2 Gy
Session Number: 22

## 2022-11-29 ENCOUNTER — Ambulatory Visit
Admission: RE | Admit: 2022-11-29 | Discharge: 2022-11-29 | Disposition: A | Discharge status: Home / Self Care | Payer: No Typology Code available for payment source | Source: Ambulatory Visit | Attending: Radiation Oncology | Admitting: Radiation Oncology

## 2022-11-29 ENCOUNTER — Other Ambulatory Visit: Payer: Self-pay

## 2022-11-29 DIAGNOSIS — C320 Malignant neoplasm of glottis: Secondary | ICD-10-CM | POA: Diagnosis not present

## 2022-11-29 LAB — RAD ONC ARIA SESSION SUMMARY
Course Elapsed Days: 34
Plan Fractions Treated to Date: 23
Plan Prescribed Dose Per Fraction: 2 Gy
Plan Total Fractions Prescribed: 35
Plan Total Prescribed Dose: 70 Gy
Reference Point Dosage Given to Date: 46 Gy
Reference Point Session Dosage Given: 2 Gy
Session Number: 23

## 2022-11-30 ENCOUNTER — Other Ambulatory Visit: Payer: Self-pay

## 2022-11-30 ENCOUNTER — Ambulatory Visit
Admission: RE | Admit: 2022-11-30 | Discharge: 2022-11-30 | Disposition: A | Discharge status: Home / Self Care | Payer: No Typology Code available for payment source | Source: Ambulatory Visit | Attending: Radiation Oncology | Admitting: Radiation Oncology

## 2022-11-30 DIAGNOSIS — C320 Malignant neoplasm of glottis: Secondary | ICD-10-CM | POA: Diagnosis not present

## 2022-11-30 LAB — RAD ONC ARIA SESSION SUMMARY
Course Elapsed Days: 35
Plan Fractions Treated to Date: 24
Plan Prescribed Dose Per Fraction: 2 Gy
Plan Total Fractions Prescribed: 35
Plan Total Prescribed Dose: 70 Gy
Reference Point Dosage Given to Date: 48 Gy
Reference Point Session Dosage Given: 2 Gy
Session Number: 24

## 2022-12-01 ENCOUNTER — Inpatient Hospital Stay: Payer: No Typology Code available for payment source | Admitting: Nutrition

## 2022-12-01 ENCOUNTER — Ambulatory Visit
Admission: RE | Admit: 2022-12-01 | Discharge: 2022-12-01 | Disposition: A | Discharge status: Home / Self Care | Payer: No Typology Code available for payment source | Source: Ambulatory Visit | Attending: Radiation Oncology | Admitting: Radiation Oncology

## 2022-12-01 ENCOUNTER — Ambulatory Visit: Payer: No Typology Code available for payment source | Attending: Radiation Oncology

## 2022-12-01 ENCOUNTER — Other Ambulatory Visit: Payer: Self-pay

## 2022-12-01 DIAGNOSIS — C320 Malignant neoplasm of glottis: Secondary | ICD-10-CM | POA: Diagnosis not present

## 2022-12-01 DIAGNOSIS — R1310 Dysphagia, unspecified: Secondary | ICD-10-CM | POA: Insufficient documentation

## 2022-12-01 DIAGNOSIS — R490 Dysphonia: Secondary | ICD-10-CM | POA: Insufficient documentation

## 2022-12-01 LAB — RAD ONC ARIA SESSION SUMMARY
Course Elapsed Days: 36
Plan Fractions Treated to Date: 25
Plan Prescribed Dose Per Fraction: 2 Gy
Plan Total Fractions Prescribed: 35
Plan Total Prescribed Dose: 70 Gy
Reference Point Dosage Given to Date: 50 Gy
Reference Point Session Dosage Given: 2 Gy
Session Number: 25

## 2022-12-01 NOTE — Therapy (Signed)
OUTPATIENT SPEECH LANGUAGE PATHOLOGY ONCOLOGY TREATMENT  Patient Name: Rodney [name] MRN: [id number] DOB:[birth year], 81 y.o., male Today's Date: 12/01/2022  PCP: Jerold Coombe, MD REFERRING PROVIDER: Eppie Gibson, MD  END OF SESSION:  End of Session - 12/01/22 0958     Visit Number 2    Number of Visits 7    Date for SLP Re-Evaluation 01/25/23    SLP Start Time 0832    SLP Stop Time  0913    SLP Time Calculation (min) 41 min    Activity Tolerance Patient tolerated treatment well              Past Medical History:  Diagnosis Date   AAA (abdominal aortic aneurysm) ([place])    stent graft put in 02/2009   Arthritis    CAD (coronary artery disease)    a. cath 2010 b. CAD s/p PCI to ramus and LCX on 03/04/15   Dysrhythmia    Gout    Hyperlipidemia    Hypertension    Paroxysmal atrial fibrillation ([place])    Tobacco abuse    Past Surgical History:  Procedure Laterality Date   ABDOMINAL AORTIC ANEURYSM REPAIR  02/19/2009   performed by [facility] N/A 04/28/2017   Procedure: Atrial Fibrillation Ablation;  Surgeon: Constance Haw, MD;  Location: [facility];  Service: Cardiovascular;  Laterality: N/A;   COLONOSCOPY     KNEE ARTHROSCOPY  2003   left   LEFT AND RIGHT HEART CATHETERIZATION WITH CORONARY ANGIOGRAM N/A 03/03/2015   Procedure: LEFT AND RIGHT HEART CATHETERIZATION WITH CORONARY ANGIOGRAM;  Surgeon: Troy Sine, MD;     MICROLARYNGOSCOPY N/A 09/14/2022   Procedure: MICROLARYNGOSCOPY WITH VOCAL CORD BIOPSY;  Surgeon: Izora Gala, MD;  Location: [facility];  Service: ENT;  Laterality: N/A;   PERCUTANEOUS CORONARY STENT INTERVENTION (PCI-S) N/A 03/04/2015   Procedure: PERCUTANEOUS CORONARY STENT INTERVENTION (PCI-S);  Surgeon: Troy Sine, MD; RI 99>>0% w/   BMS, bifurcation CFX-OM 90>>40% w/ Angiosculpt PTCA; dCFX 90>>0% w/   2.7520 mm Rebel BMS   SHOULDER ARTHROSCOPY WITH ROTATOR CUFF REPAIR AND SUBACROMIAL DECOMPRESSION Left  11/19/2013   Procedure: LEFT SHOULDER ARTHROSCOPY DEBRIDEMENT EXTENTSIVE DISTAL CLAVICULECTOMY DECOMPRESSION PARTIAL ACROMIOPLASTY WITH CORACOACROMIAL WITH ROTATOR CUFF REPAIR ;  Surgeon: Renette Butters, MD;  Location: [facility];  Service: Orthopedics;  Laterality: Left;   Patient Active Problem List   Diagnosis Date Noted   Malignant neoplasm of glottis ([place]) 10/12/2022   Palpitations 11/15/2021   Chest pain 07/22/2018   Abnormal EKG 07/22/2018   Nonischemic cardiomyopathy ([place]) 10/17/2017   AF (atrial fibrillation) ([place]) 04/28/2017   Premature ventricular contractions (PVCs) ([facility]) 03/11/2017   H/O endovascular stent graft for abdominal aortic aneurysm 09/01/2016   Cardiomyopathy, ischemic 03/30/2016   OSA (obstructive sleep apnea) 09/30/2015   Encounter for monitoring anti-arrhythmic therapy 05/04/2015   DOE (dyspnea on exertion) 03/16/2015   Atrial fibrillation with RVR ([place]) 03/16/2015   Hypertensive urgency 03/08/2015   Coronary artery disease due to lipid rich plaque    AA (aortic aneurysm) ([place])    Dyspnea 03/02/2015   Progressive angina ([place]) 03/02/2015   History of TIA (transient ischemic attack), 02/20/15 03/02/2015   Mitral regurgitation 03/02/2015   HX: anticoagulation 03/02/2015   Chest tightness    Tobacco abuse    HLD (hyperlipidemia)    TIA (transient ischemic attack) 02/20/2015   Leukopenia 02/20/2015   Chronic combined systolic and diastolic heart failure, NYHA class 1 ([place])  02/20/2015   PAF (paroxysmal atrial fibrillation) ([place]) 01/29/2015   Accelerated hypertension, hx of 23/53/6144   Acute diastolic CHF (congestive heart failure), NYHA class 2 ([place]) 01/03/2015   Demand ischemia ([place]) 01/03/2015   Coronary artery disease involving native coronary artery of native heart without angina pectoris 01/03/2015   Elevated troponin level    Thrombocytopenia ([facility])    Essential hypertension    S/P abdominal aortic aneurysm repair 07/03/2012     ONSET DATE: Sept 2023   REFERRING DIAG:   C32.0 (ICD-10-CM) - Malignant neoplasm of glottis ([place])    THERAPY DIAG:  Dysphagia, unspecified type  Hoarseness  Rationale for Evaluation and Treatment: Rehabilitation  SUBJECTIVE:   SUBJECTIVE STATEMENT: Mod -severe hoarseness today. Pt is eating soft foods and pureed foods, some liquid meal supplements. Upon questioning denies overt s/sx aspiration.  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 Presented to PCP at the [facility] in September with progressive hoarseness over the course of 1-2 years. 08/18/22 CT neck showed an irregularity of the anterior aspect of the left vocal cord with apparent extension to the left anterior hypoglottis.  At tumor board today there was also appreciation of paraglottic invasion on the left.  Examination was limited due to motion artifact. Chest CT also performed on that date showed emphysematous changes of the lungs with a few nonspecific bilateral lung nodules (measuring around 2-4 mm). CT also showed nonspecific borderline enlarged right lower paratracheal and AP window lymph nodes measuring up to 1 cm. 08/19/22 He saw Dr. Constance Holster for further evaluation. Indirect exam of the larynx performed revealed a left vocal cord mass. No palpable cervical adenopathy or masses were appreciated. Dr. Constance Holster recommended proceeding with microlaryngoscopy and biopsies of the laryngeal mass. 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  Per op note:"The larynx was normal except for the following: There is a partially exophytic mass involving the left true cord extending superiorly to the floor of the ventricle and inferiorly to the inferior aspect of the cord.  The subglottic larynx was not involved.  The lesion extended from the midportion of the left cord anteriorly to the anterior commissure and there did not appear to be involvement of the  anterior commissure but no involvement of the right cord". 10/12/22 Consult with Dr. Isidore Moos. She plans 35 fractions of radiation. 10/21/22 Consult with Dr. Chryl Heck. She has ordered a PET scan to be completed before final decision of chemotherapy is decided.  09/23/22 PET showed no evidence of metastatic disease. Treatment plan: 35 fractions of radiation to his Larynx and bilateral neck starting 10/26/22.   PAIN:  Are you having pain? Yes: NPRS scale: 0/10 Pain location: throat Pain description: sore , sharp Aggravating factors: swallowing - 7/10 Relieving factors: not swallowing   PATIENT GOALS: Maintain WNL swallowing  OBJECTIVE:   TREATMENT:  12/01/22: Pt was performing supraglottic until last week but has stopped due to pain. He cont to perform high pitch "ee", Shaker, and Masako regularly. In the past week, pt has primarily completed PT exericses and Masako with high pitch "ee" due to pain/thick saliva. SLP suggested pt cycle through all exercises one rep for each exercise that needs a swallow. With HEP today (except Shaker) pt performed exercises with mod I.  SLP had to use total A for rationale for HEP.  He ate applesauce and drank water with WNL oral stage, and no overt s/sx pharyngeal stage deficits. Pt cannot chew food currently, due to he cannot wear lower dentures at this time.  10/27/22: Research states the risk for dysphagia increases due to radiation and/or chemotherapy treatment due to a variety of factors, so SLP educated the pt about the possibility of reduced/limited ability for PO intake during rad tx. SLP also educated pt regarding possible changes to swallowing musculature after rad tx, and why adherence to dysphagia HEP provided today and PO consumption was necessary to inhibit muscle fibrosis following rad tx and to mitigate muscle disuse  atrophy. SLP informed pt why this would be detrimental to their swallowing status and to their pulmonary health. Pt demonstrated understanding of these things to SLP. SLP encouraged pt to safely eat and drink as deep into their radiation/chemotherapy as possible to provide the best possible long-term swallowing outcome for pt.    SLP then developed an individualized HEP for pt involving oral and pharyngeal and vocal  strengthening and ROM and pt was instructed how to perform these exercises, including SLP demonstration. After SLP demonstration, pt return demonstrated each exercise. SLP ensured pt performance was correct prior to educating pt on next exercise. Pt required rare min-mod cues faded to modified independent to perform HEP. Pt was instructed to complete this program 6-7 days/week, at least 2 times a day until 6 months after his or her last day of rad tx, and then x2 a week after that, indefinitely. Among other modifications for days when pt cannot functionally swallow, SLP also suggested pt to perform only non-swallowing tasks on the handout/HEP, and if necessary to cycle through the swallowing portion so the full program of exercises can be completed instead of fatiguing on one of the swallowing exercises and being unable to perform the other swallowing exercises. SLP instructed that swallowing exercises should then be added back into the regimen as pt is able to do so. Secondly, pt was told that former patients have told SLP that during their course of radiation therapy, taking prescribed pain medication just prior to performing HEP (and eating/drinking) has proven helpful in completing HEP (and eating and drinking) more regularly when going through their course of radiation treatment.    PATIENT EDUCATION: Education details: late effects head/neck radiation on swallow function, HEP procedure, modification to HEP when difficulty experienced with swallowing during and after radiation course, and see  "today's treatment" for more information Person educated: Patient Education method: Explanation, Demonstration, and Verbal cues Education comprehension: verbalized understanding, returned demonstration, verbal cues required, and needs further education   ASSESSMENT:  CLINICAL IMPRESSION: Patient is a 80 y.o. male who was seen today for treatment of swallowing as they undergo radiation/chemoradiation therapy. Today pt ate applesauce and drank thin liquids without overt s/sx oral or pharyngeal difficulty. At this time pt swallowing is deemed WNL/WFL with these types of POs. There are no overt s/s aspiration PNA observed by SLP nor any reported by pt at  this time. Data indicate that pt's swallow ability will likely decrease over the course of radiation/chemoradiation therapy and could very well decline over time following the conclusion of that therapy due to muscle disuse atrophy and/or muscle fibrosis. Pt will cont to need to be seen by SLP in order to assess safety of PO intake, assess the need for recommending any objective swallow assessment, and ensuring pt is correctly completing the individualized HEP.  OBJECTIVE IMPAIRMENTS: include voice disorder and dysphagia. These impairments are limiting patient from effectively communicating at home and in community and safety when swallowing. Factors affecting potential to achieve goals and functional outcome are  none noted today . Patient will benefit from skilled SLP services to address above impairments and improve overall function.  REHAB POTENTIAL: Excellent   GOALS: Goals reviewed with patient? No  SHORT TERM GOALS: Target: 3rd total session  Pt will compelte HEP with modified independence in 2 sessions Baseline:12-01-22 Goal status: Ongoing  2.  pt will tell SLP why pt is completing HEP with modified independence Baseline:  Goal status: Ongoing  3.  pt will describe 3 overt s/s aspiration PNA with modified independence Baseline:   Goal status: Ongoing  4.  pt will tell SLP how a food journal could hasten return to a more normalized diet Baseline:  Goal status: Ongoing   LONG TERM GOALS: Target: 7th total session  pt will complete HEP with independence over two visits Baseline:  Goal status: Ongoing  2.  pt will describe how to modify HEP over time, and the timeline associated with reduction in HEP frequency with modified independence over two sessions Baseline:  Goal status: Ongoing   PLAN:  SLP FREQUENCY:  once approx every 4-6 weeks  SLP DURATION:  7 sessions  PLANNED INTERVENTIONS: Aspiration precaution training, Pharyngeal strengthening exercises, Diet toleration management , Environmental controls, Trials of upgraded texture/liquids, SLP instruction and feedback, Compensatory strategies, and Patient/family education    [facility], [place] 12/01/2022, 9:58 AM

## 2022-12-01 NOTE — Progress Notes (Signed)
Contacted patient by telephone since appointment times needed to be changed.  Patient was unavailable however I left a message with my contact information and request for him to return call.

## 2022-12-02 ENCOUNTER — Ambulatory Visit
Admission: RE | Admit: 2022-12-02 | Discharge: 2022-12-02 | Disposition: A | Discharge status: Home / Self Care | Payer: No Typology Code available for payment source | Source: Ambulatory Visit | Attending: Radiation Oncology | Admitting: Radiation Oncology

## 2022-12-02 ENCOUNTER — Other Ambulatory Visit: Payer: Self-pay

## 2022-12-02 ENCOUNTER — Encounter: Payer: No Typology Code available for payment source | Admitting: Dietician

## 2022-12-02 ENCOUNTER — Inpatient Hospital Stay: Payer: No Typology Code available for payment source

## 2022-12-02 ENCOUNTER — Other Ambulatory Visit: Payer: Self-pay | Admitting: Radiation Oncology

## 2022-12-02 DIAGNOSIS — C320 Malignant neoplasm of glottis: Secondary | ICD-10-CM

## 2022-12-02 LAB — COMPREHENSIVE METABOLIC PANEL
ALT: 23 U/L (ref 0–44)
AST: 21 U/L (ref 15–41)
Albumin: 4.2 g/dL (ref 3.5–5.0)
Alkaline Phosphatase: 98 U/L (ref 38–126)
Anion gap: 3 — ABNORMAL LOW (ref 5–15)
BUN: 16 mg/dL (ref 8–23)
CO2: 30 mmol/L (ref 22–32)
Calcium: 9.6 mg/dL (ref 8.9–10.3)
Chloride: 106 mmol/L (ref 98–111)
Creatinine, Ser: 1.23 mg/dL (ref 0.61–1.24)
GFR, Estimated: 59 mL/min — ABNORMAL LOW (ref >60)
Glucose, Bld: 109 mg/dL — ABNORMAL HIGH (ref 70–99)
Potassium: 4.4 mmol/L (ref 3.5–5.1)
Sodium: 139 mmol/L (ref 135–145)
Total Bilirubin: 1.4 mg/dL — ABNORMAL HIGH (ref 0.3–1.2)
Total Protein: 7.6 g/dL (ref 6.5–8.1)

## 2022-12-02 LAB — CBC WITH DIFFERENTIAL/PLATELET
Abs Immature Granulocytes: 0.01 10*3/uL (ref 0.00–0.07)
Basophils Absolute: 0 10*3/uL (ref 0.0–0.1)
Basophils Relative: 1 %
Eosinophils Absolute: 0 10*3/uL (ref 0.0–0.5)
Eosinophils Relative: 1 %
HCT: 44.5 % (ref 39.0–52.0)
Hemoglobin: 15.8 g/dL (ref 13.0–17.0)
Immature Granulocytes: 0 %
Lymphocytes Relative: 13 %
Lymphs Abs: 0.3 10*3/uL — ABNORMAL LOW (ref 0.7–4.0)
MCH: 33.6 pg (ref 26.0–34.0)
MCHC: 35.5 g/dL (ref 30.0–36.0)
MCV: 94.7 fL (ref 80.0–100.0)
Monocytes Absolute: 0.4 10*3/uL (ref 0.1–1.0)
Monocytes Relative: 16 %
Neutro Abs: 1.7 10*3/uL (ref 1.7–7.7)
Neutrophils Relative %: 69 %
Platelets: 103 10*3/uL — ABNORMAL LOW (ref 150–400)
RBC: 4.7 MIL/uL (ref 4.22–5.81)
RDW: 12.8 % (ref 11.5–15.5)
WBC: 2.5 10*3/uL — ABNORMAL LOW (ref 4.0–10.5)
nRBC: 0 % (ref 0.0–0.2)

## 2022-12-02 LAB — RAD ONC ARIA SESSION SUMMARY
Course Elapsed Days: 37
Plan Fractions Treated to Date: 26
Plan Prescribed Dose Per Fraction: 2 Gy
Plan Total Fractions Prescribed: 35
Plan Total Prescribed Dose: 70 Gy
Reference Point Dosage Given to Date: 52 Gy
Reference Point Session Dosage Given: 2 Gy
Session Number: 26

## 2022-12-02 MED ORDER — SODIUM CHLORIDE 0.9 % IV SOLN
INTRAVENOUS | Status: DC
  Filled 2022-12-02: qty 250

## 2022-12-02 MED ORDER — HYDROCODONE-ACETAMINOPHEN 7.5-325 MG/15ML PO SOLN: 10.0000 mL | ORAL | 0 refills | Status: DC | PRN

## 2022-12-02 MED ORDER — SODIUM CHLORIDE 0.9 % IV SOLN: INTRAVENOUS | Status: DC

## 2022-12-02 MED ORDER — SONAFINE EX EMUL
1.0000 | Freq: Once | CUTANEOUS | Status: AC
  Administered 2022-12-02: 1 via TOPICAL

## 2022-12-02 NOTE — Patient Instructions (Signed)
Rehydration  Rehydration is the replacement of fluids, salts, and minerals in the body (electrolytes) that are lost during dehydration. Dehydration is when there is not enough water or other fluids in the body. This happens when you lose more fluids than you take in. People who are age 80 or older have a higher risk of dehydration than younger adults. This is because in older age, the body: Is less able to maintain the right amount of water. Does not respond to temperature changes as well. Does not get a sense of thirst as easily or quickly. Other causes include: Not drinking enough fluids. This can occur when you are ill, when you forget to drink, or when you are doing activities that require a lot of energy, especially in hot weather. Conditions that cause loss of water or other fluids. These include diarrhea, vomiting, sweating, or urinating a lot. Other illnesses, such as fever or infection. Certain medicines, such as those that remove excess fluid from the body (diuretics). Symptoms of mild or moderate dehydration may include thirst, dry lips and mouth, and dizziness. Symptoms of severe dehydration may include increased heart rate, confusion, fainting, and not urinating. In severe cases, you may need to get fluids through an IV at the hospital. For mild or moderate cases, you can usually rehydrate at home by drinking certain fluids as told by your health care provider. What are the risks? Rehydration is usually safe. Taking in too much fluid (overhydration) can be a problem but is rare. Overhydration can cause an imbalance of electrolytes in the body, kidney failure, fluid in the lungs, or a decrease in salt (sodium) levels in the body. Supplies needed: You will need an oral rehydration solution (ORS) if your health care provider tells you to use one. This is a drink to treat dehydration. It can be found in pharmacies and retail stores. How to rehydrate Fluids Follow instructions from your  health care provider about what to drink. The kind of fluid and the amount you should drink depend on your condition. In general, you should choose drinks that you prefer. If told by your health care provider, drink an ORS. Make an ORS by following instructions on the package. Start by drinking small amounts, about  cup (120 mL) every 5-10 minutes. Slowly increase how much you drink until you have taken in the amount recommended by your health care provider. Drink enough clear fluids to keep your urine pale yellow. If you were told to drink an ORS, finish it first, then start slowly drinking other clear fluids. Drink fluids such as: Water. This includes sparkling and flavored water. Drinking only water can lead to having too little sodium in your body (hyponatremia). Follow the advice of your health care provider. Water from ice chips you suck on. Fruit juice with water added to it(diluted). Sports drinks. Hot or cold herbal teas. Broth-based soups. Coffee. Milk or milk products. Food Follow instructions from your health care provider about what to eat while you rehydrate. Your health care provider may recommend that you slowly begin eating regular foods in small amounts. Eat foods that contain a healthy balance of electrolytes, such as bananas, oranges, potatoes, tomatoes, and spinach. Avoid foods that are greasy or contain a lot of sugar. In some cases, you may get nutrition through a feeding tube that is passed through your nose and into your stomach (nasogastric tube, or NG tube). This may be done if you have uncontrolled vomiting or diarrhea. Drinks to avoid  Certain   drinks may make dehydration worse. While you rehydrate, avoid drinking alcohol. How to tell if you are recovering from dehydration You may be getting better if: You are urinating more often than before you started rehydrating. Your urine is pale yellow. Your energy level improves. You vomit less often. You have diarrhea  less often. Your appetite improves or returns to normal. You feel less dizzy or light-headed. Your skin tone and color start to look more normal. Follow these instructions at home: Take over-the-counter and prescription medicines only as told by your health care provider. Do not take sodium tablets. Doing this can lead to having too much sodium in your body (hypernatremia). Contact a health care provider if: You continue to have symptoms of mild or moderate dehydration, such as: Thirst. Dry lips. Slightly dry mouth. Dizziness. Dark urine or less urine than usual. Muscle cramps. You continue to vomit or have diarrhea. Get help right away if: You have symptoms of dehydration that get worse. You have a fever. You have a severe headache. You have been vomiting and have problems, such as: Your vomiting gets worse. Your vomit includes blood or green matter (bile). You cannot eat or drink without vomiting. You have problems with urination or bowel movements, such as: Diarrhea that gets worse. Blood in your stool (feces). This may cause stool to look black and tarry. Not urinating, or urinating only a small amount of very dark urine, within 6-8 hours. You have trouble breathing. You have symptoms that get worse with treatment. These symptoms may be an emergency. Get help right away. Call 911. Do not wait to see if the symptoms will go away. Do not drive yourself to the hospital. This information is not intended to replace advice given to you by your health care provider. Make sure you discuss any questions you have with your health care provider. Document Revised: 04/13/2022 Document Reviewed: 04/11/2022 Elsevier Patient Education  2023 Elsevier Inc.  

## 2022-12-06 ENCOUNTER — Other Ambulatory Visit: Payer: Self-pay

## 2022-12-06 ENCOUNTER — Inpatient Hospital Stay: Payer: No Typology Code available for payment source

## 2022-12-06 ENCOUNTER — Ambulatory Visit
Admission: RE | Admit: 2022-12-06 | Discharge: 2022-12-06 | Disposition: A | Discharge status: Home / Self Care | Payer: No Typology Code available for payment source | Source: Ambulatory Visit | Attending: Radiation Oncology | Admitting: Radiation Oncology

## 2022-12-06 DIAGNOSIS — C320 Malignant neoplasm of glottis: Secondary | ICD-10-CM

## 2022-12-06 LAB — RAD ONC ARIA SESSION SUMMARY
Course Elapsed Days: 41
Plan Fractions Treated to Date: 27
Plan Prescribed Dose Per Fraction: 2 Gy
Plan Total Fractions Prescribed: 35
Plan Total Prescribed Dose: 70 Gy
Reference Point Dosage Given to Date: 54 Gy
Reference Point Session Dosage Given: 2 Gy
Session Number: 27

## 2022-12-06 MED ORDER — SODIUM CHLORIDE 0.9 % IV SOLN: INTRAVENOUS | Status: DC

## 2022-12-06 NOTE — Patient Instructions (Signed)

## 2022-12-07 ENCOUNTER — Other Ambulatory Visit: Payer: Self-pay

## 2022-12-07 ENCOUNTER — Ambulatory Visit
Admission: RE | Admit: 2022-12-07 | Discharge: 2022-12-07 | Disposition: A | Discharge status: Home / Self Care | Payer: No Typology Code available for payment source | Source: Ambulatory Visit | Attending: Radiation Oncology | Admitting: Radiation Oncology

## 2022-12-07 DIAGNOSIS — C320 Malignant neoplasm of glottis: Secondary | ICD-10-CM | POA: Diagnosis not present

## 2022-12-07 LAB — RAD ONC ARIA SESSION SUMMARY
Course Elapsed Days: 42
Plan Fractions Treated to Date: 28
Plan Prescribed Dose Per Fraction: 2 Gy
Plan Total Fractions Prescribed: 35
Plan Total Prescribed Dose: 70 Gy
Reference Point Dosage Given to Date: 56 Gy
Reference Point Session Dosage Given: 2 Gy
Session Number: 28

## 2022-12-08 ENCOUNTER — Other Ambulatory Visit: Payer: Self-pay

## 2022-12-08 ENCOUNTER — Inpatient Hospital Stay: Payer: No Typology Code available for payment source

## 2022-12-08 ENCOUNTER — Ambulatory Visit
Admission: RE | Admit: 2022-12-08 | Discharge: 2022-12-08 | Disposition: A | Discharge status: Home / Self Care | Payer: No Typology Code available for payment source | Source: Ambulatory Visit | Attending: Radiation Oncology | Admitting: Radiation Oncology

## 2022-12-08 DIAGNOSIS — E860 Dehydration: Secondary | ICD-10-CM

## 2022-12-08 DIAGNOSIS — C320 Malignant neoplasm of glottis: Secondary | ICD-10-CM | POA: Diagnosis not present

## 2022-12-08 LAB — RAD ONC ARIA SESSION SUMMARY
Course Elapsed Days: 43
Plan Fractions Treated to Date: 29
Plan Prescribed Dose Per Fraction: 2 Gy
Plan Total Fractions Prescribed: 35
Plan Total Prescribed Dose: 70 Gy
Reference Point Dosage Given to Date: 58 Gy
Reference Point Session Dosage Given: 2 Gy
Session Number: 29

## 2022-12-08 MED ORDER — SODIUM CHLORIDE 0.9 % IV SOLN: Freq: Once | INTRAVENOUS | Status: AC

## 2022-12-08 NOTE — Patient Instructions (Signed)

## 2022-12-09 ENCOUNTER — Ambulatory Visit
Admission: RE | Admit: 2022-12-09 | Discharge: 2022-12-09 | Disposition: A | Discharge status: Home / Self Care | Payer: No Typology Code available for payment source | Source: Ambulatory Visit | Attending: Radiation Oncology | Admitting: Radiation Oncology

## 2022-12-09 ENCOUNTER — Inpatient Hospital Stay: Payer: No Typology Code available for payment source | Admitting: Dietician

## 2022-12-09 ENCOUNTER — Other Ambulatory Visit: Payer: Self-pay

## 2022-12-09 ENCOUNTER — Other Ambulatory Visit: Payer: Self-pay | Admitting: Dietician

## 2022-12-09 DIAGNOSIS — C320 Malignant neoplasm of glottis: Secondary | ICD-10-CM | POA: Diagnosis not present

## 2022-12-09 LAB — RAD ONC ARIA SESSION SUMMARY
Course Elapsed Days: 44
Plan Fractions Treated to Date: 30
Plan Prescribed Dose Per Fraction: 2 Gy
Plan Total Fractions Prescribed: 35
Plan Total Prescribed Dose: 70 Gy
Reference Point Dosage Given to Date: 60 Gy
Reference Point Session Dosage Given: 2 Gy
Session Number: 30

## 2022-12-09 NOTE — Progress Notes (Signed)
Nutrition Follow-up:  Patient with glottis cancer. He is receiving radiation therapy under the care of Dr. Squire. He is receiving supportive care with IV fluids 3x/week (started 12/22)  Met with patient and his wife after radiation. Patient reports decreased appetite and intake. His throat is sore and has pain with swallowing. Patient is taking Hycet for pain. Wife reports pt eating 3 times/day but portions are small. He has single egg with grits or smoothie (kale, spinach, peanut butter, milk, fruit) for breakfast. Usually soups for lunch and dinner. Patient has lost his taste. He has thick saliva. Patient is doing baking soda salt water rinses several times daily as well as sprite rinses. He is drinking 2 Ensure Plus. Patient endorses drinking a good amount of water. He denies nausea, vomiting, diarrhea, constipation.   Medications: reviewed   Labs: reviewed   Anthropometrics: Last aria wt 209.2 lb on 12/22 decreased 2% (5 lbs) in 4 days - severe for time frame  12/18 - 214.2 lb 12/4 - 216.6 lb 11/27 - 215.4 lb    NUTRITION DIAGNOSIS: Predicted suboptimal intake has evolved into inadequate oral intake related to HNC and associated treatment as evidenced by side effects of radiation therapy (sore throat, dysphagia, thick saliva, diminished taste) affecting ability to eat/drink, 2% wt loss in 4 days which is significant for time frame.     INTERVENTION:  Patient receiving supportive care with IV fluids 3x/week Increase Ensure Plus/equivalent to three times daily - provided samples of Boost VHC (530 kcal, 22 g protein) for pt to try Encouraged small frequent meals of soft smooth foods q2-3h Continue high calorie high protein smoothies  Additional sample packets of CIB provided Continue baking soda salt water rinses  Ongoing support and encouragement    MONITORING, EVALUATION, GOAL: weight trends, intake   NEXT VISIT: Tuesday January 2 during IV fluids    

## 2022-12-10 ENCOUNTER — Inpatient Hospital Stay: Payer: No Typology Code available for payment source

## 2022-12-10 VITALS — BP 118/57 | HR 53 | Temp 98.5°F | Resp 16

## 2022-12-10 DIAGNOSIS — C320 Malignant neoplasm of glottis: Secondary | ICD-10-CM | POA: Diagnosis not present

## 2022-12-10 DIAGNOSIS — E860 Dehydration: Secondary | ICD-10-CM

## 2022-12-10 MED ORDER — SODIUM CHLORIDE 0.9 % IV SOLN: INTRAVENOUS | Status: DC

## 2022-12-13 ENCOUNTER — Ambulatory Visit
Admission: RE | Admit: 2022-12-13 | Discharge: 2022-12-13 | Disposition: A | Discharge status: Home / Self Care | Payer: No Typology Code available for payment source | Source: Ambulatory Visit | Attending: Radiation Oncology | Admitting: Radiation Oncology

## 2022-12-13 ENCOUNTER — Inpatient Hospital Stay: Payer: No Typology Code available for payment source | Admitting: Dietician

## 2022-12-13 ENCOUNTER — Other Ambulatory Visit: Payer: Self-pay

## 2022-12-13 ENCOUNTER — Inpatient Hospital Stay: Payer: No Typology Code available for payment source | Attending: Hematology and Oncology

## 2022-12-13 DIAGNOSIS — C320 Malignant neoplasm of glottis: Secondary | ICD-10-CM

## 2022-12-13 LAB — RAD ONC ARIA SESSION SUMMARY
Course Elapsed Days: 48
Plan Fractions Treated to Date: 31
Plan Prescribed Dose Per Fraction: 2 Gy
Plan Total Fractions Prescribed: 35
Plan Total Prescribed Dose: 70 Gy
Reference Point Dosage Given to Date: 62 Gy
Reference Point Session Dosage Given: 2 Gy
Session Number: 31

## 2022-12-13 LAB — COMPREHENSIVE METABOLIC PANEL
ALT: 40 U/L (ref 0–44)
AST: 31 U/L (ref 15–41)
Albumin: 4 g/dL (ref 3.5–5.0)
Alkaline Phosphatase: 81 U/L (ref 38–126)
Anion gap: 6 (ref 5–15)
BUN: 14 mg/dL (ref 8–23)
CO2: 26 mmol/L (ref 22–32)
Calcium: 9.5 mg/dL (ref 8.9–10.3)
Chloride: 107 mmol/L (ref 98–111)
Creatinine, Ser: 1.06 mg/dL (ref 0.61–1.24)
GFR, Estimated: >60 mL/min (ref >60)
Glucose, Bld: 91 mg/dL (ref 70–99)
Potassium: 4.1 mmol/L (ref 3.5–5.1)
Sodium: 139 mmol/L (ref 135–145)
Total Bilirubin: 1.2 mg/dL (ref 0.3–1.2)
Total Protein: 7.4 g/dL (ref 6.5–8.1)

## 2022-12-13 LAB — CBC WITH DIFFERENTIAL/PLATELET
Abs Immature Granulocytes: 0 10*3/uL (ref 0.00–0.07)
Basophils Absolute: 0 10*3/uL (ref 0.0–0.1)
Basophils Relative: 1 %
Eosinophils Absolute: 0 10*3/uL (ref 0.0–0.5)
Eosinophils Relative: 1 %
HCT: 41.6 % (ref 39.0–52.0)
Hemoglobin: 14.7 g/dL (ref 13.0–17.0)
Immature Granulocytes: 0 %
Lymphocytes Relative: 10 %
Lymphs Abs: 0.2 10*3/uL — ABNORMAL LOW (ref 0.7–4.0)
MCH: 33.6 pg (ref 26.0–34.0)
MCHC: 35.3 g/dL (ref 30.0–36.0)
MCV: 95 fL (ref 80.0–100.0)
Monocytes Absolute: 0.4 10*3/uL (ref 0.1–1.0)
Monocytes Relative: 16 %
Neutro Abs: 1.7 10*3/uL (ref 1.7–7.7)
Neutrophils Relative %: 72 %
Platelets: 116 10*3/uL — ABNORMAL LOW (ref 150–400)
RBC: 4.38 MIL/uL (ref 4.22–5.81)
RDW: 12.6 % (ref 11.5–15.5)
WBC: 2.4 10*3/uL — ABNORMAL LOW (ref 4.0–10.5)
nRBC: 0 % (ref 0.0–0.2)

## 2022-12-13 MED ORDER — SODIUM CHLORIDE 0.9 % IV SOLN: INTRAVENOUS | Status: DC

## 2022-12-13 MED ORDER — SODIUM CHLORIDE 0.9 % IV SOLN
Freq: Once | INTRAVENOUS | Status: DC
  Filled 2022-12-13: qty 250

## 2022-12-13 NOTE — Progress Notes (Signed)
Nutrition Follow-up:  Patient with glottis cancer. He has completed 31/35 planned fractions of radiation therapy.  Met with patient and wife during infusion. He is receiving IV fluids today. Ongoing sore throat, dysphagia, and decreased appetite. Wife is supportive and encouraging patient to eat/drink. Patient is tolerating soft smooth foods and liquids. He is drinking 3 Ensure.    Medications: reviewed   Labs: reviewed  Anthropometrics: Weights continue to trend down.  Wt 202.2 lb today decreased 3.4% from 209.2 lb on 12/22 - this is severe    NUTRITION DIAGNOSIS: Inadequate oral intake continues     INTERVENTION:  Patient receiving IV fluids 3x/week  Continue drinking 3-4 Ensure Plus/equivalent  Reviewed ways to add calories and protein to foods Support and encouragement     MONITORING, EVALUATION, GOAL: weight trends, intake   NEXT VISIT: Wednesday January 10 during IVF

## 2022-12-13 NOTE — Patient Instructions (Signed)

## 2022-12-14 ENCOUNTER — Ambulatory Visit
Admission: RE | Admit: 2022-12-14 | Discharge: 2022-12-14 | Disposition: A | Discharge status: Home / Self Care | Payer: No Typology Code available for payment source | Source: Ambulatory Visit | Attending: Radiation Oncology | Admitting: Radiation Oncology

## 2022-12-14 ENCOUNTER — Other Ambulatory Visit: Payer: Self-pay

## 2022-12-14 DIAGNOSIS — C320 Malignant neoplasm of glottis: Secondary | ICD-10-CM | POA: Diagnosis not present

## 2022-12-14 LAB — RAD ONC ARIA SESSION SUMMARY
Course Elapsed Days: 49
Plan Fractions Treated to Date: 32
Plan Prescribed Dose Per Fraction: 2 Gy
Plan Total Fractions Prescribed: 35
Plan Total Prescribed Dose: 70 Gy
Reference Point Dosage Given to Date: 64 Gy
Reference Point Session Dosage Given: 2 Gy
Session Number: 32

## 2022-12-15 ENCOUNTER — Ambulatory Visit
Admission: RE | Admit: 2022-12-15 | Discharge: 2022-12-15 | Disposition: A | Discharge status: Home / Self Care | Payer: No Typology Code available for payment source | Source: Ambulatory Visit | Attending: Radiation Oncology | Admitting: Radiation Oncology

## 2022-12-15 ENCOUNTER — Other Ambulatory Visit: Payer: Self-pay

## 2022-12-15 ENCOUNTER — Inpatient Hospital Stay: Payer: No Typology Code available for payment source

## 2022-12-15 DIAGNOSIS — C320 Malignant neoplasm of glottis: Secondary | ICD-10-CM

## 2022-12-15 LAB — RAD ONC ARIA SESSION SUMMARY
Course Elapsed Days: 50
Plan Fractions Treated to Date: 33
Plan Prescribed Dose Per Fraction: 2 Gy
Plan Total Fractions Prescribed: 35
Plan Total Prescribed Dose: 70 Gy
Reference Point Dosage Given to Date: 66 Gy
Reference Point Session Dosage Given: 2 Gy
Session Number: 33

## 2022-12-15 MED ORDER — SODIUM CHLORIDE 0.9 % IV SOLN: INTRAVENOUS | Status: DC

## 2022-12-15 NOTE — Patient Instructions (Signed)

## 2022-12-16 ENCOUNTER — Other Ambulatory Visit: Payer: Self-pay

## 2022-12-16 ENCOUNTER — Ambulatory Visit
Admission: RE | Admit: 2022-12-16 | Discharge: 2022-12-16 | Disposition: A | Discharge status: Home / Self Care | Payer: No Typology Code available for payment source | Source: Ambulatory Visit | Attending: Radiation Oncology | Admitting: Radiation Oncology

## 2022-12-16 ENCOUNTER — Encounter: Payer: No Typology Code available for payment source | Admitting: Dietician

## 2022-12-16 DIAGNOSIS — C320 Malignant neoplasm of glottis: Secondary | ICD-10-CM | POA: Diagnosis not present

## 2022-12-16 LAB — RAD ONC ARIA SESSION SUMMARY
Course Elapsed Days: 51
Plan Fractions Treated to Date: 34
Plan Prescribed Dose Per Fraction: 2 Gy
Plan Total Fractions Prescribed: 35
Plan Total Prescribed Dose: 70 Gy
Reference Point Dosage Given to Date: 68 Gy
Reference Point Session Dosage Given: 2 Gy
Session Number: 34

## 2022-12-17 ENCOUNTER — Inpatient Hospital Stay: Payer: No Typology Code available for payment source

## 2022-12-17 DIAGNOSIS — C320 Malignant neoplasm of glottis: Secondary | ICD-10-CM

## 2022-12-17 MED ORDER — SODIUM CHLORIDE 0.9 % IV SOLN: INTRAVENOUS | Status: DC

## 2022-12-17 NOTE — Patient Instructions (Signed)

## 2022-12-19 ENCOUNTER — Ambulatory Visit
Admission: RE | Admit: 2022-12-19 | Discharge: 2022-12-19 | Disposition: A | Discharge status: Home / Self Care | Payer: No Typology Code available for payment source | Source: Ambulatory Visit | Attending: Radiation Oncology | Admitting: Radiation Oncology

## 2022-12-19 ENCOUNTER — Other Ambulatory Visit: Payer: Self-pay

## 2022-12-19 ENCOUNTER — Other Ambulatory Visit: Payer: Self-pay | Admitting: Radiation Oncology

## 2022-12-19 ENCOUNTER — Inpatient Hospital Stay: Payer: No Typology Code available for payment source

## 2022-12-19 DIAGNOSIS — C320 Malignant neoplasm of glottis: Secondary | ICD-10-CM

## 2022-12-19 LAB — CBC WITH DIFFERENTIAL/PLATELET
Abs Immature Granulocytes: 0 10*3/uL (ref 0.00–0.07)
Basophils Absolute: 0 10*3/uL (ref 0.0–0.1)
Basophils Relative: 1 %
Eosinophils Absolute: 0 10*3/uL (ref 0.0–0.5)
Eosinophils Relative: 1 %
HCT: 41.1 % (ref 39.0–52.0)
Hemoglobin: 14.5 g/dL (ref 13.0–17.0)
Immature Granulocytes: 0 %
Lymphocytes Relative: 7 %
Lymphs Abs: 0.2 10*3/uL — ABNORMAL LOW (ref 0.7–4.0)
MCH: 33.3 pg (ref 26.0–34.0)
MCHC: 35.3 g/dL (ref 30.0–36.0)
MCV: 94.3 fL (ref 80.0–100.0)
Monocytes Absolute: 0.4 10*3/uL (ref 0.1–1.0)
Monocytes Relative: 15 %
Neutro Abs: 2.3 10*3/uL (ref 1.7–7.7)
Neutrophils Relative %: 76 %
Platelets: 126 10*3/uL — ABNORMAL LOW (ref 150–400)
RBC: 4.36 MIL/uL (ref 4.22–5.81)
RDW: 12.6 % (ref 11.5–15.5)
WBC: 3 10*3/uL — ABNORMAL LOW (ref 4.0–10.5)
nRBC: 0 % (ref 0.0–0.2)

## 2022-12-19 LAB — RAD ONC ARIA SESSION SUMMARY
Course Elapsed Days: 54
Plan Fractions Treated to Date: 35
Plan Prescribed Dose Per Fraction: 2 Gy
Plan Total Fractions Prescribed: 35
Plan Total Prescribed Dose: 70 Gy
Reference Point Dosage Given to Date: 70 Gy
Reference Point Session Dosage Given: 2 Gy
Session Number: 35

## 2022-12-19 LAB — COMPREHENSIVE METABOLIC PANEL
ALT: 60 U/L — ABNORMAL HIGH (ref 0–44)
AST: 43 U/L — ABNORMAL HIGH (ref 15–41)
Albumin: 3.9 g/dL (ref 3.5–5.0)
Alkaline Phosphatase: 93 U/L (ref 38–126)
Anion gap: 7 (ref 5–15)
BUN: 15 mg/dL (ref 8–23)
CO2: 26 mmol/L (ref 22–32)
Calcium: 9.5 mg/dL (ref 8.9–10.3)
Chloride: 106 mmol/L (ref 98–111)
Creatinine, Ser: 1.31 mg/dL — ABNORMAL HIGH (ref 0.61–1.24)
GFR, Estimated: 55 mL/min — ABNORMAL LOW (ref >60)
Glucose, Bld: 95 mg/dL (ref 70–99)
Potassium: 4.2 mmol/L (ref 3.5–5.1)
Sodium: 139 mmol/L (ref 135–145)
Total Bilirubin: 1.4 mg/dL — ABNORMAL HIGH (ref 0.3–1.2)
Total Protein: 6.9 g/dL (ref 6.5–8.1)

## 2022-12-19 MED ORDER — HYDROCODONE-ACETAMINOPHEN 7.5-325 MG/15ML PO SOLN: 10.0000 mL | ORAL | 0 refills | Status: DC | PRN

## 2022-12-19 MED ORDER — SODIUM CHLORIDE 0.9 % IV SOLN: INTRAVENOUS | Status: DC

## 2022-12-19 NOTE — Patient Instructions (Signed)

## 2022-12-19 NOTE — Progress Notes (Signed)
Oncology Nurse Navigator Documentation   Met with Rodney [name] after final RT to offer support and to celebrate end of radiation treatment.   Provided verbal post-RT guidance: Importance of keeping all follow-up appts, especially those with Nutrition and SLP. Importance of protecting treatment area from sun. Continuation of Sonafine application 2-3 times daily, application of antibiotic ointment to areas of raw skin; when supply of Sonafine exhausted transition to OTC lotion with vitamin E.  Explained my role as navigator will continue for several more months, encouraged him to call me with needs/concerns.    [name] RN, BSN, OCN Head & Neck Oncology Nurse [place] at [facility] Phone # [phone]  Fax # [phone]

## 2022-12-21 ENCOUNTER — Other Ambulatory Visit: Payer: Self-pay

## 2022-12-21 ENCOUNTER — Inpatient Hospital Stay: Payer: No Typology Code available for payment source | Admitting: Dietician

## 2022-12-21 ENCOUNTER — Inpatient Hospital Stay: Payer: No Typology Code available for payment source

## 2022-12-21 DIAGNOSIS — C320 Malignant neoplasm of glottis: Secondary | ICD-10-CM | POA: Diagnosis not present

## 2022-12-21 MED ORDER — SODIUM CHLORIDE 0.9 % IV SOLN: INTRAVENOUS | Status: DC

## 2022-12-21 NOTE — Patient Instructions (Signed)

## 2022-12-21 NOTE — Progress Notes (Signed)
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 Isidore Moos  Met with patient during infusion. He is receiving IV fluids. Patient resting at visit, wife present. Wife reports ongoing sore throat and thick saliva. Patient frequent coughing and spitting this out. Patient is having blood tinged saliva in the mornings. They have discussed this with Dr. Isidore Moos. Wife reports patient had a good breakfast yesterday (grits, corn beef hash, juice, apple cider) He did not have much else. Patient tried [employer]. He did not like peach flavor. Wife has vegetable/fruit powder that she is mixing with water. Patient has been drinking a couple of these daily. He has not been drinking as many Ensure recently. The taste and consistency are challenging.  Medications: reviewed   Labs: 1/8 labs reviewed   Anthropometrics: Last aria wt 197.6 lb on 1/8 decreased 2.5% in 7 days - severe  1/2 - 202.2 lb 12/22 - 209.2 lb  NUTRITION DIAGNOSIS: Inadequate intake continues    INTERVENTION:  Continue supportive care - IV fluids x3/week Encouraged 4-5 Ensure Plus/equivalent - suggested cutting with milk to desired consistency Provided samples of [employer] for pt to try  Provided unflavored sample packets of [place] protein powder for pt to try - suggested adding this to fruit/vegetable powder  Support and encouragement     MONITORING, EVALUATION, GOAL: weight trends, intake    NEXT VISIT: Wednesday January 17 during infusion

## 2022-12-23 ENCOUNTER — Inpatient Hospital Stay: Payer: No Typology Code available for payment source

## 2022-12-23 DIAGNOSIS — C320 Malignant neoplasm of glottis: Secondary | ICD-10-CM

## 2022-12-23 MED ORDER — SODIUM CHLORIDE 0.9 % IV SOLN: INTRAVENOUS | Status: DC

## 2022-12-23 NOTE — Progress Notes (Signed)
Mr. Rodney [name] presents to clinic following completion of radiation therapy for glottic cancer. He completed treatment on 12-19-22.  Pain issues, if any: 2/10 Sore throat Using a feeding tube?: N/A Weight changes, if any: None  Swallowing issues, if any: Mild dysphagia Smoking or chewing tobacco? None Using fluoride trays daily? No Last ENT visit was on: Not since diagnosis Other notable issues, if any: Possible, mild swelling of the inferior gums. Dentures no longer fit well, they are too tight. Taste has also diminished.

## 2022-12-23 NOTE — Patient Instructions (Signed)
Dehydration, Adult Dehydration is a condition in which there is not enough water or other fluids in the body. This happens when a person loses more fluids than he or she takes in. Important organs, such as the kidneys, brain, and heart, cannot function without a proper amount of fluids. Any loss of fluids from the body can lead to dehydration. Dehydration can be mild, moderate, or severe. It should be treated right away to prevent it from becoming severe. What are the causes? Dehydration may be caused by: Conditions that cause loss of water or other fluids, such as diarrhea, vomiting, or sweating or urinating a lot. Not drinking enough fluids, especially when you are ill or doing activities that require a lot of energy. Other illnesses and conditions, such as fever or infection. Certain medicines, such as medicines that remove excess fluid from the body (diuretics). Lack of safe drinking water. Not being able to get enough water and food. What increases the risk? The following factors may make you more likely to develop this condition: Having a long-term (chronic) illness that has not been treated properly, such as diabetes, heart disease, or kidney disease. Being 65 years of age or older. Having a disability. Living in a place that is high in altitude, where thinner, drier air causes more fluid loss. Doing exercises that put stress on your body for a long time (endurance sports). What are the signs or symptoms? Symptoms of dehydration depend on how severe it is. Mild or moderate dehydration Thirst. Dry lips or dry mouth. Dizziness or light-headedness, especially when standing up from a seated position. Muscle cramps. Dark urine. Urine may be the color of tea. Less urine or tears produced than usual. Headache. Severe dehydration Changes in skin. Your skin may be cold and clammy, blotchy, or pale. Your skin also may not return to normal after being lightly pinched and released. Little or  no tears, urine, or sweat. Changes in vital signs, such as rapid breathing and low blood pressure. Your pulse may be weak or may be faster than 100 beats a minute when you are sitting still. Other changes, such as: Feeling very thirsty. Sunken eyes. Cold hands and feet. Confusion. Being very tired (lethargic) or having trouble waking from sleep. Short-term weight loss. Loss of consciousness. How is this diagnosed? This condition is diagnosed based on your symptoms and a physical exam. You may have blood and urine tests to help confirm the diagnosis. How is this treated? Treatment for this condition depends on how severe it is. Treatment should be started right away. Do not wait until dehydration becomes severe. Severe dehydration is an emergency and needs to be treated in a hospital. Mild or moderate dehydration can be treated at home. You may be asked to: Drink more fluids. Drink an oral rehydration solution (ORS). This drink helps restore proper amounts of fluids and salts and minerals in the blood (electrolytes). Severe dehydration can be treated: With IV fluids. By correcting abnormal levels of electrolytes. This is often done by giving electrolytes through a tube that is passed through your nose and into your stomach (nasogastric tube, or NG tube). By treating the underlying cause of dehydration. Follow these instructions at home: Oral rehydration solution If told by your health care provider, drink an ORS: Make an ORS by following instructions on the package. Start by drinking small amounts, about  cup (120 mL) every 5-10 minutes. Slowly increase how much you drink until you have taken the amount recommended by your health   care provider. Eating and drinking        Drink enough clear fluid to keep your urine pale yellow. If you were told to drink an ORS, finish the ORS first and then start slowly drinking other clear fluids. Drink fluids such as: Water. Do not drink only  water. Doing that can lead to hyponatremia, which is having too little salt (sodium) in the body. Water from ice chips you suck on. Fruit juice that you have added water to (diluted fruit juice). Low-calorie sports drinks. Eat foods that contain a healthy balance of electrolytes, such as bananas, oranges, potatoes, tomatoes, and spinach. Do not drink alcohol. Avoid the following: Drinks that contain a lot of sugar. These include high-calorie sports drinks, fruit juice that is not diluted, and soda. Caffeine. Foods that are greasy or contain a lot of fat or sugar. General instructions Take over-the-counter and prescription medicines only as told by your health care provider. Do not take sodium tablets. Doing that can lead to having too much sodium in the body (hypernatremia). Return to your normal activities as told by your health care provider. Ask your health care provider what activities are safe for you. Keep all follow-up visits as told by your health care provider. This is important. Contact a health care provider if: You have muscle cramps, pain, or discomfort, such as: Pain in your abdomen and the pain gets worse or stays in one area (localizes). Stiff neck. You have a rash. You are more irritable than usual. You are sleepier or have a harder time waking than usual. You feel weak or dizzy. You feel very thirsty. Get help right away if you have: Any symptoms of severe dehydration. Symptoms of vomiting, such as: You cannot eat or drink without vomiting. Vomiting gets worse or does not go away. Vomit includes blood or green matter (bile). Symptoms that get worse with treatment. A fever. A severe headache. Problems with urination or bowel movements, such as: Diarrhea that gets worse or does not go away. Blood in your stool (feces). This may cause stool to look black and tarry. Not urinating, or urinating only a small amount of very dark urine, within 6-8 hours. Trouble  breathing. These symptoms may represent a serious problem that is an emergency. Do not wait to see if the symptoms will go away. Get medical help right away. Call your local emergency services (911 in the [place] Do not drive yourself to the hospital. Summary Dehydration is a condition in which there is not enough water or other fluids in the body. This happens when a person loses more fluids than he or she takes in. Treatment for this condition depends on how severe it is. Treatment should be started right away. Do not wait until dehydration becomes severe. Drink enough clear fluid to keep your urine pale yellow. If you were told to drink an oral rehydration solution (ORS), finish the ORS first and then start slowly drinking other clear fluids. Take over-the-counter and prescription medicines only as told by your health care provider. Get help right away if you have any symptoms of severe dehydration. This information is not intended to replace advice given to you by your health care provider. Make sure you discuss any questions you have with your health care provider. Document Revised: 04/06/2022 Document Reviewed: 07/11/2019 Elsevier Patient Education  2023 [employer]  

## 2022-12-26 ENCOUNTER — Other Ambulatory Visit: Payer: Self-pay

## 2022-12-26 ENCOUNTER — Inpatient Hospital Stay: Payer: No Typology Code available for payment source

## 2022-12-26 ENCOUNTER — Ambulatory Visit
Admission: RE | Admit: 2022-12-26 | Discharge: 2022-12-26 | Disposition: A | Discharge status: Home / Self Care | Payer: No Typology Code available for payment source | Source: Ambulatory Visit | Attending: Radiation Oncology | Admitting: Radiation Oncology

## 2022-12-26 DIAGNOSIS — C320 Malignant neoplasm of glottis: Secondary | ICD-10-CM

## 2022-12-26 LAB — CBC WITH DIFFERENTIAL/PLATELET
Abs Immature Granulocytes: 0.01 10*3/uL (ref 0.00–0.07)
Basophils Absolute: 0 10*3/uL (ref 0.0–0.1)
Basophils Relative: 1 %
Eosinophils Absolute: 0 10*3/uL (ref 0.0–0.5)
Eosinophils Relative: 1 %
HCT: 37.7 % — ABNORMAL LOW (ref 39.0–52.0)
Hemoglobin: 13.6 g/dL (ref 13.0–17.0)
Immature Granulocytes: 0 %
Lymphocytes Relative: 10 %
Lymphs Abs: 0.3 10*3/uL — ABNORMAL LOW (ref 0.7–4.0)
MCH: 34.1 pg — ABNORMAL HIGH (ref 26.0–34.0)
MCHC: 36.1 g/dL — ABNORMAL HIGH (ref 30.0–36.0)
MCV: 94.5 fL (ref 80.0–100.0)
Monocytes Absolute: 0.4 10*3/uL (ref 0.1–1.0)
Monocytes Relative: 16 %
Neutro Abs: 2 10*3/uL (ref 1.7–7.7)
Neutrophils Relative %: 72 %
Platelets: 127 10*3/uL — ABNORMAL LOW (ref 150–400)
RBC: 3.99 MIL/uL — ABNORMAL LOW (ref 4.22–5.81)
RDW: 12.8 % (ref 11.5–15.5)
WBC: 2.8 10*3/uL — ABNORMAL LOW (ref 4.0–10.5)
nRBC: 0 % (ref 0.0–0.2)

## 2022-12-26 LAB — COMPREHENSIVE METABOLIC PANEL
ALT: 82 U/L — ABNORMAL HIGH (ref 0–44)
AST: 51 U/L — ABNORMAL HIGH (ref 15–41)
Albumin: 3.4 g/dL — ABNORMAL LOW (ref 3.5–5.0)
Alkaline Phosphatase: 87 U/L (ref 38–126)
Anion gap: 7 (ref 5–15)
BUN: 18 mg/dL (ref 8–23)
CO2: 25 mmol/L (ref 22–32)
Calcium: 9.4 mg/dL (ref 8.9–10.3)
Chloride: 108 mmol/L (ref 98–111)
Creatinine, Ser: 1.11 mg/dL (ref 0.61–1.24)
GFR, Estimated: >60 mL/min (ref >60)
Glucose, Bld: 95 mg/dL (ref 70–99)
Potassium: 4.2 mmol/L (ref 3.5–5.1)
Sodium: 140 mmol/L (ref 135–145)
Total Bilirubin: 1.5 mg/dL — ABNORMAL HIGH (ref 0.3–1.2)
Total Protein: 7 g/dL (ref 6.5–8.1)

## 2022-12-26 MED ORDER — SODIUM CHLORIDE 0.9 % IV SOLN: INTRAVENOUS | Status: DC

## 2022-12-26 NOTE — Patient Instructions (Signed)

## 2022-12-28 ENCOUNTER — Other Ambulatory Visit: Payer: Self-pay

## 2022-12-28 ENCOUNTER — Inpatient Hospital Stay: Payer: No Typology Code available for payment source | Admitting: Dietician

## 2022-12-28 ENCOUNTER — Inpatient Hospital Stay: Payer: No Typology Code available for payment source

## 2022-12-28 DIAGNOSIS — C320 Malignant neoplasm of glottis: Secondary | ICD-10-CM | POA: Diagnosis not present

## 2022-12-28 MED ORDER — SODIUM CHLORIDE 0.9 % IV SOLN: INTRAVENOUS | Status: DC

## 2022-12-28 NOTE — Patient Instructions (Signed)
Dehydration, Adult Dehydration is a condition in which there is not enough water or other fluids in the body. This happens when a person loses more fluids than he or she takes in. Important organs, such as the kidneys, brain, and heart, cannot function without a proper amount of fluids. Any loss of fluids from the body can lead to dehydration. Dehydration can be mild, moderate, or severe. It should be treated right away to prevent it from becoming severe. What are the causes? Dehydration may be caused by: Conditions that cause loss of water or other fluids, such as diarrhea, vomiting, or sweating or urinating a lot. Not drinking enough fluids, especially when you are ill or doing activities that require a lot of energy. Other illnesses and conditions, such as fever or infection. Certain medicines, such as medicines that remove excess fluid from the body (diuretics). Lack of safe drinking water. Not being able to get enough water and food. What increases the risk? The following factors may make you more likely to develop this condition: Having a long-term (chronic) illness that has not been treated properly, such as diabetes, heart disease, or kidney disease. Being 65 years of age or older. Having a disability. Living in a place that is high in altitude, where thinner, drier air causes more fluid loss. Doing exercises that put stress on your body for a long time (endurance sports). What are the signs or symptoms? Symptoms of dehydration depend on how severe it is. Mild or moderate dehydration Thirst. Dry lips or dry mouth. Dizziness or light-headedness, especially when standing up from a seated position. Muscle cramps. Dark urine. Urine may be the color of tea. Less urine or tears produced than usual. Headache. Severe dehydration Changes in skin. Your skin may be cold and clammy, blotchy, or pale. Your skin also may not return to normal after being lightly pinched and released. Little or  no tears, urine, or sweat. Changes in vital signs, such as rapid breathing and low blood pressure. Your pulse may be weak or may be faster than 100 beats a minute when you are sitting still. Other changes, such as: Feeling very thirsty. Sunken eyes. Cold hands and feet. Confusion. Being very tired (lethargic) or having trouble waking from sleep. Short-term weight loss. Loss of consciousness. How is this diagnosed? This condition is diagnosed based on your symptoms and a physical exam. You may have blood and urine tests to help confirm the diagnosis. How is this treated? Treatment for this condition depends on how severe it is. Treatment should be started right away. Do not wait until dehydration becomes severe. Severe dehydration is an emergency and needs to be treated in a hospital. Mild or moderate dehydration can be treated at home. You may be asked to: Drink more fluids. Drink an oral rehydration solution (ORS). This drink helps restore proper amounts of fluids and salts and minerals in the blood (electrolytes). Severe dehydration can be treated: With IV fluids. By correcting abnormal levels of electrolytes. This is often done by giving electrolytes through a tube that is passed through your nose and into your stomach (nasogastric tube, or NG tube). By treating the underlying cause of dehydration. Follow these instructions at home: Oral rehydration solution If told by your health care provider, drink an ORS: Make an ORS by following instructions on the package. Start by drinking small amounts, about  cup (120 mL) every 5-10 minutes. Slowly increase how much you drink until you have taken the amount recommended by your health   care provider. Eating and drinking        Drink enough clear fluid to keep your urine pale yellow. If you were told to drink an ORS, finish the ORS first and then start slowly drinking other clear fluids. Drink fluids such as: Water. Do not drink only  water. Doing that can lead to hyponatremia, which is having too little salt (sodium) in the body. Water from ice chips you suck on. Fruit juice that you have added water to (diluted fruit juice). Low-calorie sports drinks. Eat foods that contain a healthy balance of electrolytes, such as bananas, oranges, potatoes, tomatoes, and spinach. Do not drink alcohol. Avoid the following: Drinks that contain a lot of sugar. These include high-calorie sports drinks, fruit juice that is not diluted, and soda. Caffeine. Foods that are greasy or contain a lot of fat or sugar. General instructions Take over-the-counter and prescription medicines only as told by your health care provider. Do not take sodium tablets. Doing that can lead to having too much sodium in the body (hypernatremia). Return to your normal activities as told by your health care provider. Ask your health care provider what activities are safe for you. Keep all follow-up visits as told by your health care provider. This is important. Contact a health care provider if: You have muscle cramps, pain, or discomfort, such as: Pain in your abdomen and the pain gets worse or stays in one area (localizes). Stiff neck. You have a rash. You are more irritable than usual. You are sleepier or have a harder time waking than usual. You feel weak or dizzy. You feel very thirsty. Get help right away if you have: Any symptoms of severe dehydration. Symptoms of vomiting, such as: You cannot eat or drink without vomiting. Vomiting gets worse or does not go away. Vomit includes blood or green matter (bile). Symptoms that get worse with treatment. A fever. A severe headache. Problems with urination or bowel movements, such as: Diarrhea that gets worse or does not go away. Blood in your stool (feces). This may cause stool to look black and tarry. Not urinating, or urinating only a small amount of very dark urine, within 6-8 hours. Trouble  breathing. These symptoms may represent a serious problem that is an emergency. Do not wait to see if the symptoms will go away. Get medical help right away. Call your local emergency services (911 in the [place] Do not drive yourself to the hospital. Summary Dehydration is a condition in which there is not enough water or other fluids in the body. This happens when a person loses more fluids than he or she takes in. Treatment for this condition depends on how severe it is. Treatment should be started right away. Do not wait until dehydration becomes severe. Drink enough clear fluid to keep your urine pale yellow. If you were told to drink an oral rehydration solution (ORS), finish the ORS first and then start slowly drinking other clear fluids. Take over-the-counter and prescription medicines only as told by your health care provider. Get help right away if you have any symptoms of severe dehydration. This information is not intended to replace advice given to you by your health care provider. Make sure you discuss any questions you have with your health care provider. Document Revised: 04/06/2022 Document Reviewed: 07/11/2019 Elsevier Patient Education  2023 [employer]  

## 2022-12-28 NOTE — Progress Notes (Signed)
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 Isidore Moos   Met with patient during infusion. He is receiving IV fluids. Patient reports it has been a rough week with ongoing thick saliva and sore throat. States he has no appetite, but is pushing nutrition. He wants to be able to taste his breakfast. Patient tolerated Boost Breeze in the berry flavor added to smoothie. He reports recent episode of vomiting with Ensure. Patient has not been drinking these the last few days. Patient reports constipation. Reports last BM was 3 days ago. He has started taking miralax. Patient is drinking plenty of water. He is reducing dose/frequency of Hycet. States it is helpful in managing pain, but makes him sleepy.   Medications: reviewed  Labs: reviewed   Anthropometrics: No new weights   NUTRITION DIAGNOSIS: Inadequate oral intake continues   INTERVENTION:  Continue supportive care - IV fluids 3x/week Provided additional samples of Boost Breeze - dairy free high calorie shake recipes given Encouraged soft moist high protein foods    MONITORING, EVALUATION, GOAL: weight trends, intake   NEXT VISIT: Friday January 26 during IV fluids

## 2022-12-29 NOTE — Radiation Completion Notes (Signed)
Patient Name: Rodney [name], Rodney [name] MRN: [id number] Date of Birth: [birth year] Referring Physician: Izora Gala, M.D. Date of Service: 2022-12-29 Radiation Oncologist: Eppie Gibson, M.D. 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, N=cN0, M=cM0 Intent: Curative     ==========DELIVERED PLANS==========  First Treatment Date: 2022-10-26 - Last Treatment Date: 2022-12-19   Plan Name: HN_Larynx Site: Larynx Technique: IMRT Mode: Photon Dose Per Fraction: 2 Gy Prescribed Dose (Delivered / Prescribed): 70 Gy / 70 Gy Prescribed Fxs (Delivered / Prescribed): 35 / 35     ==========ON TREATMENT VISIT DATES========== 2022-10-31, 2022-11-07, 2022-11-14, 2022-11-21, 2022-11-28, 2022-12-02, 2022-12-13, 2022-12-19     ==========UPCOMING VISITS========== 2024-01-23T20:00:00Z [facility] Izora Gala, MD; Eppie Gibson, MD        ==========APPENDIX - ON TREATMENT VISIT NOTES==========   PatEd 2022-10-31 Ongoing education performed.   ImpPlan 2022-10-31 The patient is tolerating radiation. Continue treatment as planned.   PhysExam 2022-10-31 Alert, no acute distress.   RunningNotes 2022-10-31 10-31-22 education    ProgNote 2022-10-31 Changes from last week/visit? [ No ] Pain? [ No ] Dysphagia? [ No ] Thick saliva/mouth irritation? [ Yes ] Mouth ulcers? [ No ] PEG tube? Any issues? [ No ] Have they received chemo (at any point during their radiation treatment)? [ No ] Taking anything by mouth or all via PEG? [ na ] How much clear fluid are they taking in? [ 64 oz ] Are they doing their salt/baking soda rinses? [ No - nursing reinforced patient education ] Need refill on lotions? [ No ] Need refills: [  ] Additional  Weekly Progress Notes [  ]     PatEd 2022-11-07 Ongoing education performed.   ImpPlan 2022-11-07 The patient is tolerating radiation. Continue treatment as planned.   PhysExam 2022-11-07 Alert, no acute distress.   ProgNote 2022-11-07 Changes from last week/visit? [  ] Pain? [ He reports a little discomfort around his throat. ] Dysphagia? [ No ] Thick saliva/mouth irritation? [ He reports some thickened saliva this week, he is doing baking soda rinses. ] Mouth ulcers? [ Denies mouth ulcers. ] PEG tube? Any issues? [ No ] Have they received chemo (at any point during their radiation treatment)? [ No ] Taking anything by mouth or all via PEG? [ Taking in nutrion PO. ] How much clear fluid are they taking in? [ He reports he is drinking fluids well, getting in 4 plus cups daily. ] Are they doing their salt/baking soda rinses? [ Yes ] Need refill on lotions? [  ] Need refills: [  ] Additional  Weekly Progress Notes [  ]    PatEd 2022-11-14 Ongoing education performed.   ImpPlan 2022-11-14 The patient is tolerating radiation. Continue treatment as planned.   PhysExam 2022-11-14 Alert, no acute distress.   ProgNote 2022-11-14 Changes from last week/visit? [ Yes, small amount of blood in saliva ] Pain? [ No ] Dysphagia? [ No ] Thick saliva/mouth irritation? [ Yes ] Mouth ulcers? [ No ] PEG tube? Any issues? [ No ] Have they received chemo (at any point during their radiation treatment)? [ No ] Taking anything by mouth or all via PEG? [ na ] How much clear fluid  are they taking in? [ Evyn.Fought  ] Are they doing their salt/baking soda rinses? [ Yes ] Need refill on lotions? [ Yes ] Need refills: [ No ] Additional  Weekly Progress Notes [ none at this time ]    PatEd 2022-11-21 Ongoing education performed.   ImpPlan 2022-11-21 The patient is tolerating radiation. Continue treatment as planned.   PhysExam 2022-11-21 Alert, no acute distress.   ProgNote 2022-11-21 Changes from last week/visit? [ Yes ] Pain? [ Yes,  neck and throat ] Dysphagia? [ no ] Thick saliva/mouth irritation? [ Yes ] Mouth ulcers? [ No ] PEG tube? Any issues? [ No ] Have they received chemo (at any point during their radiation treatment)? [ No ] Taking anything by mouth or all via PEG? [ na ] How much clear fluid are they taking in? [ 64 oz ] Are they doing their salt/baking soda rinses? [ Yes ] Need refill on lotions? [ No ] Need refills: [ No ] Additional  Weekly Progress Notes [ drinking ensure  ]    PatEd 2022-11-28 Ongoing education performed.   ImpPlan 2022-11-28 The patient is tolerating radiation. Continue treatment as planned.   PhysExam 2022-11-28 Alert, no acute distress.   ProgNote 2022-11-28 Changes from last week/visit? [ Yes ] Pain? [ Yes ] Dysphagia? [ Yes ] Thick saliva/mouth irritation? [ No ] Mouth ulcers? [ No ] PEG tube? Any issues? [ No ] Have they received chemo (at any point during their radiation treatment)? [ No ] Taking anything by mouth or all via PEG? [ Yes - solid and liquids ] How much clear fluid are they taking in? [ 60oz daily ] Are they doing their salt/baking soda rinses? [ No - nursing reinforced patient education ] Need refill on lotions? [ No ] Need refills: [ No ] Additional  Weekly Progress Notes [  ]    PatEd 2022-12-02 Ongoing education performed.   ImpPlan 2022-12-02 The patient is tolerating radiation. Continue treatment as planned.   PhysExam 2022-12-02 Alert, no acute distress.   ProgNote 2022-12-02 Changes from last week/visit? [  ] Pain? [ Yes- throat pain, rating 7/10. Patient not taking any medications for pain.  ] Dysphagia? [ Yes ] Thick saliva/mouth irritation? [ Yes ] Mouth ulcers? [ No ] PEG tube? Any issues? [ No ] Have they received chemo (at any point during their radiation treatment)? [ No ] Taking anything by mouth or all via PEG? [ Yes - solid and liquids ] How much clear fluid are they taking in? [ 4-5 bottles of water daily ] Are they doing their salt/baking  soda rinses? [ Yes ] Need refill on lotions? [ Yes ] Need refills: [ No ] Additional  Weekly Progress Notes [ Patient reports having a poor appetite.  Reports drinking ensure plus twice per day. Noted patient to have a 5lb weight loss in 1 week.  ]    PatEd 2022-12-13 Ongoing education performed.   PhysExam 2022-12-13 Alert, no acute distress.   ImpPlan 2022-12-13 The patient is tolerating radiation. Continue treatment as planned.   ProgNote 2022-12-13 Changes from last week/visit? [ Yes ] Pain? [ Yes- Sore throat 6/10 ] Dysphagia? [ Yes ] Thick saliva/mouth irritation? [ Yes ] Mouth ulcers? [ No- Oral tenderness 3/10 ] PEG tube? Any issues? [ No ] Have they received chemo (at any point during their radiation treatment)? [ No ] Taking anything by mouth or all via PEG? [ Yes - solid and liquids ]  How much clear fluid are they taking in? [ 70 oz daily ] Are they doing their salt/baking soda rinses? [ Yes ] Need refill on lotions? [ No ] Need refills: [ No ] Additional  Weekly Progress Notes [  ]    ImpPlan 2022-12-19 The patient is tolerating radiation. Continue treatment as planned.   PatEd 2022-12-19 Ongoing education performed.   PhysExam 2022-12-19 Alert, no acute distress.   ProgNote 2022-12-19 Changes from last week/visit? [ Yes, saliva thicker ] Pain? [ No ] Dysphagia? [ Yes, having trouble, mostly drinking  ] Thick saliva/mouth irritation? [ Yes ] Mouth ulcers? [ No ] PEG tube? Any issues? [ No ] Have they received chemo (at any point during their radiation treatment)? [ No ] Taking anything by mouth or all via PEG? [ no, na ] How much clear fluid are they taking in? [ Katie.Gibbons ] Are they doing their salt/baking soda rinses? [ Yes ] Need refill on lotions? [ No ] Need refills: [ No ] Additional  Weekly Progress Notes [ voice still hoarse,  ]

## 2022-12-30 ENCOUNTER — Inpatient Hospital Stay: Payer: No Typology Code available for payment source

## 2022-12-30 ENCOUNTER — Other Ambulatory Visit: Payer: Self-pay

## 2022-12-30 DIAGNOSIS — C320 Malignant neoplasm of glottis: Secondary | ICD-10-CM | POA: Diagnosis not present

## 2022-12-30 MED ORDER — SODIUM CHLORIDE 0.9 % IV SOLN: INTRAVENOUS | Status: DC

## 2023-01-02 ENCOUNTER — Ambulatory Visit
Admission: RE | Admit: 2023-01-02 | Discharge: 2023-01-02 | Disposition: A | Discharge status: Home / Self Care | Payer: No Typology Code available for payment source | Source: Ambulatory Visit | Attending: Radiation Oncology | Admitting: Radiation Oncology

## 2023-01-02 ENCOUNTER — Other Ambulatory Visit: Payer: Self-pay

## 2023-01-02 ENCOUNTER — Inpatient Hospital Stay: Payer: No Typology Code available for payment source

## 2023-01-02 DIAGNOSIS — C320 Malignant neoplasm of glottis: Secondary | ICD-10-CM

## 2023-01-02 LAB — CBC WITH DIFFERENTIAL/PLATELET
Abs Immature Granulocytes: 0 10*3/uL (ref 0.00–0.07)
Basophils Absolute: 0 10*3/uL (ref 0.0–0.1)
Basophils Relative: 0 %
Eosinophils Absolute: 0 10*3/uL (ref 0.0–0.5)
Eosinophils Relative: 1 %
HCT: 36 % — ABNORMAL LOW (ref 39.0–52.0)
Hemoglobin: 12.4 g/dL — ABNORMAL LOW (ref 13.0–17.0)
Immature Granulocytes: 0 %
Lymphocytes Relative: 9 %
Lymphs Abs: 0.2 10*3/uL — ABNORMAL LOW (ref 0.7–4.0)
MCH: 32.9 pg (ref 26.0–34.0)
MCHC: 34.4 g/dL (ref 30.0–36.0)
MCV: 95.5 fL (ref 80.0–100.0)
Monocytes Absolute: 0.4 10*3/uL (ref 0.1–1.0)
Monocytes Relative: 15 %
Neutro Abs: 1.9 10*3/uL (ref 1.7–7.7)
Neutrophils Relative %: 75 %
Platelets: 131 10*3/uL — ABNORMAL LOW (ref 150–400)
RBC: 3.77 MIL/uL — ABNORMAL LOW (ref 4.22–5.81)
RDW: 13.6 % (ref 11.5–15.5)
WBC: 2.5 10*3/uL — ABNORMAL LOW (ref 4.0–10.5)
nRBC: 0 % (ref 0.0–0.2)

## 2023-01-02 LAB — COMPREHENSIVE METABOLIC PANEL
ALT: 69 U/L — ABNORMAL HIGH (ref 0–44)
AST: 46 U/L — ABNORMAL HIGH (ref 15–41)
Albumin: 3.3 g/dL — ABNORMAL LOW (ref 3.5–5.0)
Alkaline Phosphatase: 78 U/L (ref 38–126)
Anion gap: 5 (ref 5–15)
BUN: 14 mg/dL (ref 8–23)
CO2: 25 mmol/L (ref 22–32)
Calcium: 9.3 mg/dL (ref 8.9–10.3)
Chloride: 107 mmol/L (ref 98–111)
Creatinine, Ser: 1.16 mg/dL (ref 0.61–1.24)
GFR, Estimated: >60 mL/min (ref >60)
Glucose, Bld: 91 mg/dL (ref 70–99)
Potassium: 3.9 mmol/L (ref 3.5–5.1)
Sodium: 137 mmol/L (ref 135–145)
Total Bilirubin: 1.7 mg/dL — ABNORMAL HIGH (ref 0.3–1.2)
Total Protein: 6.3 g/dL — ABNORMAL LOW (ref 6.5–8.1)

## 2023-01-02 MED ORDER — SODIUM CHLORIDE 0.9 % IV SOLN: INTRAVENOUS | Status: DC

## 2023-01-02 NOTE — Patient Instructions (Signed)

## 2023-01-03 ENCOUNTER — Other Ambulatory Visit: Payer: Self-pay

## 2023-01-03 ENCOUNTER — Ambulatory Visit
Admission: RE | Admit: 2023-01-03 | Discharge: 2023-01-03 | Disposition: A | Discharge status: Home / Self Care | Payer: No Typology Code available for payment source | Source: Ambulatory Visit | Attending: Radiation Oncology | Admitting: Radiation Oncology

## 2023-01-03 ENCOUNTER — Encounter: Payer: Self-pay | Admitting: Radiation Oncology

## 2023-01-03 VITALS — BP 104/58 | HR 64 | Temp 97.2°F | Resp 18 | Ht 74.0 in | Wt 193.4 lb

## 2023-01-03 DIAGNOSIS — R1310 Dysphagia, unspecified: Secondary | ICD-10-CM | POA: Diagnosis not present

## 2023-01-03 DIAGNOSIS — Z7901 Long term (current) use of anticoagulants: Secondary | ICD-10-CM | POA: Insufficient documentation

## 2023-01-03 DIAGNOSIS — C320 Malignant neoplasm of glottis: Secondary | ICD-10-CM | POA: Diagnosis not present

## 2023-01-03 DIAGNOSIS — Z79899 Other long term (current) drug therapy: Secondary | ICD-10-CM | POA: Diagnosis not present

## 2023-01-03 DIAGNOSIS — Z7982 Long term (current) use of aspirin: Secondary | ICD-10-CM | POA: Diagnosis not present

## 2023-01-03 DIAGNOSIS — C329 Malignant neoplasm of larynx, unspecified: Secondary | ICD-10-CM

## 2023-01-03 NOTE — Progress Notes (Addendum)
Radiation Oncology         [phone] ________________________________  Name: Rodney [name] MRN: [id number]  Date: 01/03/2023  DOB: [birth year]  Follow-Up Visit Note  CC: Constance Haw, MD  Izora Gala, MD  Diagnosis and Prior Radiotherapy:       ICD-10-CM   1. Malignant neoplasm of glottis ([place])  C32.0 CMP ([place] only)      CHIEF COMPLAINT:  Here for follow-up and surveillance of glottic cancer cancer  Narrative:  The patient returns today for routine follow-up.  Patient successfully completed radiation therapy for glottic cancer on 12/19/22.   Interval Since Last Radiation:  2 weeks  Pain issues, if any: 2/10 Sore throat Using a feeding tube?: N/A Weight changes, if any: None   Swallowing issues, if any: Mild dysphagia Smoking or chewing tobacco? None Using fluoride trays daily? No Last ENT visit was on: Not since diagnosis Other notable issues, if any: Possible, mild swelling of the inferior gums. Dentures no longer fit well, they are too tight. Taste has also diminished.   ALLERGIES:  is allergic to lisinopril and iohexol.  Meds: Current Outpatient Medications  Medication Sig Dispense Refill   amLODipine (NORVASC) 5 MG tablet Take 1 tablet (5 mg total) daily by mouth. 90 tablet 3   apixaban (ELIQUIS) 5 MG TABS tablet Take 1 tablet (5 mg total) by mouth 2 (two) times daily. 180 tablet 3   aspirin EC 81 MG tablet Take 81 mg by mouth daily.     atorvastatin (LIPITOR) 80 MG tablet Take 1 tablet (80 mg total) by mouth daily. (Patient taking differently: Take 80 mg by mouth every evening.) 90 tablet 3   carvedilol (COREG) 25 MG tablet Take 12.5 mg by mouth 2 (two) times daily with a meal.     diltiazem (CARDIZEM) 30 MG tablet TAKE 1 TABLET BY MOUTH FOUR TIMES DAILY AS NEEDED FOR IRREGULAR HEART RATE GREATER THAN 100 AND BLOOD PRESSURE GREATER THAN 100 45 tablet 6   famotidine (PEPCID) 40 MG tablet Take 1 tablet (40 mg total) by mouth 2 (two) times daily. 180  tablet 2   fish oil-omega-3 fatty acids 1000 MG capsule Take 1,000 mg by mouth in the morning.     furosemide (LASIX) 20 MG tablet Take 20 mg by mouth in the morning.     HYDROcodone-acetaminophen (HYCET) 7.5-325 mg/15 ml solution Take 10-15 mLs by mouth every 4 (four) hours as needed for moderate pain. Take with food. 473 mL 0   irbesartan (AVAPRO) 300 MG tablet TAKE 1/2 TABLET(150 MG) BY MOUTH DAILY 90 tablet 3   isosorbide mononitrate (IMDUR) 60 MG 24 hr tablet TAKE 1 TABLET BY MOUTH EVERY DAY ( CAN NOT BE FILL 06.04.2018) 90 tablet 2   KLOR-CON M10 10 MEQ tablet TAKE 1 TABLET BY MOUTH TWICE A DAY 180 tablet 3   lidocaine (XYLOCAINE) 2 % solution Patient: Mix 1part 2% viscous lidocaine, 1part H20. Swallow 66m of diluted mixture, 348m before meals and at bedtime, up to QID 200 mL 3   loratadine (CLARITIN) 10 MG tablet Take 10 mg by mouth in the morning.     Multiple Vitamin (MULTIVITAMIN WITH MINERALS) TABS tablet Take 1 tablet by mouth daily.     nicotine (NICODERM CQ - DOSED IN MG/24 HOURS) 14 mg/24hr patch Place 1 patch onto the skin daily.     nitroGLYCERIN (NITROSTAT) 0.4 MG SL tablet DISSOLVE 1 TABLET UNDER THE TONGUE EVERY 5 MINUTES AS NEEDED FOR CHEST PAIN(  SHORTNESS OF BREATH) 25 tablet 3   omeprazole (PRILOSEC) 20 MG capsule Take 20 mg by mouth daily before breakfast.     No current facility-administered medications for this encounter.   Facility-Administered Medications Ordered in Other Encounters  Medication Dose Route Frequency Provider Last Rate Last Admin   0.9 %  sodium chloride infusion   Intravenous Once Eppie Gibson, MD        Physical Findings: The patient is in no acute distress. Patient is alert and oriented. Wt Readings from Last 3 Encounters:  01/03/23 193 lb 6 oz (87.7 kg)  01/02/23 193 lb (87.5 kg)  12/15/22 202 lb (91.6 kg)    height is '6\' 2"'$  (1.88 m) and weight is 193 lb 6 oz (87.7 kg). His temporal temperature is 97.2 F (36.2 C) (abnormal). His blood  pressure is 104/58 (abnormal) and his pulse is 64. His respiration is 18 and oxygen saturation is 96%. .  General: Alert and oriented, in no acute distress HEENT: Head is normocephalic. Extraocular movements are intact. No notable lesions or masses in the oropharynx. Mild subtle yellow hue visualized at the sclera.  Neck: No palpable masses in the neck.  Skin: Skin in treatment fields shows satisfactory healing. Hyperpigmentation with some skin peeling, typical with radiation change Lymphatics: see Neck Exam Psychiatric: Judgment and insight are intact. Affect is appropriate.   Lab Findings: Lab Results  Component Value Date   WBC 2.5 (L) 01/02/2023   HGB 12.4 (L) 01/02/2023   HCT 36.0 (L) 01/02/2023   MCV 95.5 01/02/2023   PLT 131 (L) 01/02/2023    Lab Results  Component Value Date   TSH 0.522 10/21/2022     CMP     Component Value Date/Time   NA 137 01/02/2023 0829   NA 142 09/11/2020 0938   K 3.9 01/02/2023 0829   CL 107 01/02/2023 0829   CO2 25 01/02/2023 0829   GLUCOSE 91 01/02/2023 0829   BUN 14 01/02/2023 0829   BUN 12 09/11/2020 0938   CREATININE 1.16 01/02/2023 0829   CREATININE 1.25 (H) 10/27/2022 0806   CREATININE 1.28 (H) 03/30/2016 1416   CALCIUM 9.3 01/02/2023 0829   PROT 6.3 (L) 01/02/2023 0829   ALBUMIN 3.3 (L) 01/02/2023 0829   AST 46 (H) 01/02/2023 0829   ALT 69 (H) 01/02/2023 0829   ALKPHOS 78 01/02/2023 0829   BILITOT 1.7 (H) 01/02/2023 0829   GFRNONAA >60 01/02/2023 0829   GFRNONAA 58 (L) 10/27/2022 0806   GFRAA 68 09/11/2020 0938    Radiographic Findings: No results found.  Impression/Plan:    1) Head and Neck Cancer Status: Patient is recovering well from the effects of radiation. I am pleased with his response thus far. No evidence of disease recurrence on clinical exam today. Continue to get plenty of caloric intake and physical activity.   2) Nutritional Status: Has lost 10 lbs since starting therapy and lost an additional 10 lbs  since completing radiation. He is receiving IV fluids along with drinking and eating solid foods on his own. Will continue IV fluids until 01/11/23.   PEG tube: no PEG tube  3) Risk Factors: The patient has been educated about risk factors including alcohol and tobacco abuse; they understand that avoidance of alcohol and tobacco is important to prevent recurrences as well as other cancers  4) Swallowing: Is swallowing solid foods on his own. Endorses mild dysphagia.  5) Dental: Encouraged to continue regular followup with dentistry, and dental hygiene including fluoride  rinses.   6) Thyroid function: Continue annual  Lab Results  Component Value Date   TSH 0.522 10/21/2022    7) Other: Patient's liver enzymes and bilirubin were slightly elevated on his last CMP. Patient also exhibits mild jaundice in his sclera. We will get repeat CMP to recheck levels next week and may contact his providers if labs still trending up vs consider CT of liver. It is unclear who his current PCP is per my discussion with him about this today, but he has seen Dr. Debara Pickett, Dr. Mancel Bale, and Dr. Cassell Clement previously.  Cannot rule out drug reaction.  Liver mets would be rare.  8) Follow-up in 2.5 months. Will get restaging PET scan in April followed by a routine office visit to review imaging. The patient was encouraged to call with any issues or questions before then.   On date of service, in total, I spent 30 minutes on this encounter. Patient was seen in person. _____________________________________   Leona Singleton, PA    Eppie Gibson, MD

## 2023-01-04 ENCOUNTER — Inpatient Hospital Stay: Payer: No Typology Code available for payment source

## 2023-01-04 VITALS — BP 97/67 | HR 89 | Temp 97.9°F | Resp 17

## 2023-01-04 DIAGNOSIS — C320 Malignant neoplasm of glottis: Secondary | ICD-10-CM

## 2023-01-04 MED ORDER — SODIUM CHLORIDE 0.9 % IV SOLN: Freq: Once | INTRAVENOUS | Status: AC

## 2023-01-04 NOTE — Therapy (Signed)
OUTPATIENT PHYSICAL THERAPY HEAD AND NECK POST RADIATION FOLLOW UP   Patient Name: Rodney [name] MRN: [id number] DOB:1941/01/16, 82 y.o., male Today's Date: 01/05/2023  END OF SESSION:  PT End of Session - 01/05/23 0958     Visit Number 2    Number of Visits 2    Date for PT Re-Evaluation 01/05/23    PT Start Time 1000    PT Stop Time 3151    PT Time Calculation (min) 35 min    Activity Tolerance Patient tolerated treatment well    Behavior During Therapy [facility] for tasks assessed/performed             Past Medical History:  Diagnosis Date   AAA (abdominal aortic aneurysm) ([place])    stent graft put in 02/2009   Arthritis    CAD (coronary artery disease)    a. cath 2010 b. CAD s/p PCI to ramus and LCX on 03/04/15   Dysrhythmia    Gout    Hyperlipidemia    Hypertension    Paroxysmal atrial fibrillation ([place])    Tobacco abuse    Past Surgical History:  Procedure Laterality Date   ABDOMINAL AORTIC ANEURYSM REPAIR  02/19/2009   performed by [place] N/A 04/28/2017   Procedure: Atrial Fibrillation Ablation;  Surgeon: Constance Haw, MD;  Location: [place] [facility];  Service: Cardiovascular;  Laterality: N/A;   COLONOSCOPY     KNEE ARTHROSCOPY  2003   left   LEFT AND RIGHT HEART CATHETERIZATION WITH CORONARY ANGIOGRAM N/A 03/03/2015   Procedure: LEFT AND RIGHT HEART CATHETERIZATION WITH CORONARY ANGIOGRAM;  Surgeon: Troy Sine, MD;     MICROLARYNGOSCOPY N/A 09/14/2022   Procedure: MICROLARYNGOSCOPY WITH VOCAL CORD BIOPSY;  Surgeon: Izora Gala, MD;  Location: [place];  Service: ENT;  Laterality: N/A;   PERCUTANEOUS CORONARY STENT INTERVENTION (PCI-S) N/A 03/04/2015   Procedure: PERCUTANEOUS CORONARY STENT INTERVENTION (PCI-S);  Surgeon: Troy Sine, MD; RI 99>>0% w/   BMS, bifurcation CFX-OM 90>>40% w/ Angiosculpt PTCA; dCFX 90>>0% w/   2.7520 mm Rebel BMS   SHOULDER ARTHROSCOPY WITH ROTATOR CUFF REPAIR AND SUBACROMIAL DECOMPRESSION Left  11/19/2013   Procedure: LEFT SHOULDER ARTHROSCOPY DEBRIDEMENT EXTENTSIVE DISTAL CLAVICULECTOMY DECOMPRESSION PARTIAL ACROMIOPLASTY WITH CORACOACROMIAL WITH ROTATOR CUFF REPAIR ;  Surgeon: Renette Butters, MD;  Location: [place];  Service: Orthopedics;  Laterality: Left;   Patient Active Problem List   Diagnosis Date Noted   Malignant neoplasm of glottis ([place]) 10/12/2022   Palpitations 11/15/2021   Chest pain 07/22/2018   Abnormal EKG 07/22/2018   Nonischemic cardiomyopathy ([place]) 10/17/2017   AF (atrial fibrillation) ([place]) 04/28/2017   Premature ventricular contractions (PVCs) ([facility]) 03/11/2017   H/O endovascular stent graft for abdominal aortic aneurysm 09/01/2016   Cardiomyopathy, ischemic 03/30/2016   OSA (obstructive sleep apnea) 09/30/2015   Encounter for monitoring anti-arrhythmic therapy 05/04/2015   DOE (dyspnea on exertion) 03/16/2015   Atrial fibrillation with RVR ([place]) 03/16/2015   Hypertensive urgency 03/08/2015   Coronary artery disease due to lipid rich plaque    AA (aortic aneurysm) ([place])    Dyspnea 03/02/2015   Progressive angina ([place]) 03/02/2015   History of TIA (transient ischemic attack), 02/20/15 03/02/2015   Mitral regurgitation 03/02/2015   HX: anticoagulation 03/02/2015   Chest tightness    Tobacco abuse    HLD (hyperlipidemia)    TIA (transient ischemic attack) 02/20/2015   Leukopenia 02/20/2015   Chronic combined systolic and diastolic heart failure, NYHA  class 1 ([place]) 02/20/2015   PAF (paroxysmal atrial fibrillation) ([place]) 01/29/2015   Accelerated hypertension, hx of 10/05/8526   Acute diastolic CHF (congestive heart failure), NYHA class 2 ([place]) 01/03/2015   Demand ischemia ([place]) 01/03/2015   Coronary artery disease involving native coronary artery of native heart without angina pectoris 01/03/2015   Elevated troponin level    Thrombocytopenia ([facility])    Essential hypertension    S/P abdominal aortic aneurysm repair 07/03/2012    PCP:  [name], MD, Dr. Cassell Clement at the [facility]  REFERRING PROVIDER: Eppie Gibson, MD  REFERRING DIAG: C32.0 (ICD-10-CM) - Malignant neoplasm of glottis [facility])   THERAPY DIAG:  Abnormal posture  Malignant neoplasm of glottis [facility])  Rationale for Evaluation and Treatment: Rehabilitation  ONSET DATE: 08/18/2022  SUBJECTIVE:                                                                                                                                                                                           SUBJECTIVE STATEMENT: Skin did pretty well with radiation. My throat has been sore  and I couldn't eat. Its getting some better now.  My taste buds aren't working either.He does not notice any swelling. I haven't done any exercises in the last 2 weeks because I have been getting fluids for dehydration and feeling pretty rough.  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 He presented to his PCP at the [place] in September with progressive hoarseness over the course of 1-2 years. 08/18/22 CT neck showed an irregularity of the anterior aspect of the left vocal cord with apparent extension to the left anterior hypoglottis.  At tumor board today there was also appreciation of paraglottic invasion on the left.  Examination was limited due to motion artifact. Chest CT also performed on that date showed emphysematous changes of the lungs with a few nonspecific bilateral lung nodules (measuring around 2-4 mm). CT also showed nonspecific borderline enlarged right lower paratracheal and AP window lymph nodes measuring up to 1 cm. 08/19/22 He saw Dr. Constance Holster for further evaluation. Indirect exam of the larynx performed revealed a left vocal cord mass. No palpable cervical adenopathy or masses were appreciated. Dr. 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  Per op note:"The  larynx was normal except for the following: There is a partially exophytic mass involving the left true cord extending superiorly to the floor of the ventricle and inferiorly to the inferior aspect of the cord.  The subglottic larynx was not involved.  The lesion extended from  the midportion of the left cord anteriorly to the anterior commissure and there did not appear to be involvement of the anterior commissure but no involvement of the right cord". He will receive 35 fractions of radiation to his Larynx and bilateral neck starting 10/26/22 and ending 12/19/2022. No Chemo was required as  11/15 PET was clear.   PATIENT GOALS:  Reassess how my recovery is going related to neck ROM, cervical pain, fatigue, and swelling.  PAIN:  Are you having pain? No NPRS scale: 1-2/10 Pain location: sore throat Pain orientation: Other: mid throat   PAIN TYPE: sore Pain description: intermittent  Aggravating factors: eating/talking Relieving factors: medication  PRECAUTIONS: Recent radiation, Head and neck lymphedema risk,    OBJECTIVE:   POSTURE:  Forward head and rounded shoulders posture  30 SEC SIT TO STAND: Pt declined doing  because he hasn't been feeling well due to dehydration and the need for multiple infusions  SHOULDER AROM:   Lolo surgery on left approx 10 years ago.     CERVICAL AROM:     EVAL;Percent limited 01/05/2023  Flexion WFL WNL  Extension 25% limited 15% limited  Right lateral flexion 75% limited 55 % limited  Left lateral flexion 75% limited 65%  Right rotation 35% limited 30% limited  Left rotation 35% limited 35%limited                          (Blank rows=not tested)   GAIT: WNL but forward head and looking down   LYMPHEDEMA ASSESSMENT:    Circumference in cm  4 cm superior to sternal notch around neck 38.3  6 cm superior to sternal notch around neck 38.5  8 cm superior to sternal notch around neck 38.8  R lateral nostril from base of nose to medial tragus    L lateral nostril from base of nose to medial tragus   R corner of mouth to where ear lobe meets face 10  L corner of mouth to where ear lobe meets face 10        (Blank rows=not tested)  CURRENT/PAST TREATMENTS:  Surgery type/date: 09/14/2022 Microlaryngoscopy/biopsy  Chemotherapy: none  Radiation:35 fractions to larynx and bilateral neck starting 10/26/2022 and ending 12/19/2022  OTHER SYMPTOMS: Pain [place] throat but improving Fibrosis No Pitting edema No Infections No Decreased scar mobility No  PATIENT EDUCATION:  Education details: Continue cervical ROM exs to continue to improve ROM, watch for swelling in neck and notify MD and [place] immediately, begin walking program again as dehydration improves and you feel better, be mindful of proper posture Person educated: Patient Education method: Explanation, Demonstration, and Handouts Education comprehension: verbalized understanding and returned demonstration  HOME EXERCISE PROGRAM: Reviewed previously given post op HEP x 5 for ea Discussed walking program  ASSESSMENT:  CLINICAL IMPRESSION: Pt is s/p microlaryngoscopy/biopsy for SCC on 10/04/2022 . He had 35 fractions of radiation which ended of Dec 19, 2022. Hyperpigmentation is noted bilateral cervical region. He has some loose skin on the anterior neck but he reports this is normal for him. He has a very slight puffy area above the left jaw that he reports is also normal. He was reminded to continue his cervical ROM exercises and to start on a walking program as soon as he feels better from his episodes of dehydration. He was also reminded about proper posture. He was advised to contact the MD/us immediately if he begins to notice any swelling.  He was given  written follow up instructions.  Pt will benefit from skilled therapeutic intervention to improve on the following deficits: decreased ROM  PT treatment/interventions: ADL/Self care home management, Therapeutic exercises,  Patient/Family education, and Self Care   GOALS Name Target Date  Goal status  1 Pt will demonstrate a return to baseline cervical ROM measurements and not demonstrate any signs or symptoms of lymphedema. Baseline: 01/05/2023 MET  '2     3     4        '$ GOALS: Goals reviewed with patient? Yes  LONG TERM GOALS:  (STG=LTG)   PLAN:  PT FREQUENCY/DURATION: no further needs identified at this time  PLAN FOR NEXT SESSION: Pt is discharged today but knows to follow up if he evelops any swelling or other problems related to his treatment.   [facility]  [address]  [place] [address]  [phone]   Scar massage You can begin gentle scar massage to you incision sites. Gently place one hand on the incision and move the skin (without sliding on the skin) in various directions. Do this for a few minutes and then you can gently massage either coconut oil or vitamin E cream into the scars.  Home exercise Program Continue doing the exercises you were given until you feel like you can do them without feeling any tightness at the end. It is best to do them for several months after completion of radiation since the effects of radiation continue past completion.   Walking Program Studies show that 30 minutes of walking per day (fast enough to elevate your heart rate) can significantly reduce the risk of a cancer recurrence. If you can't walk due to other medical reasons, we encourage you to find another activity you could do (like a stationary bike or water exercise).  Posture After treatment for head and neck cancer, people frequently sit with rounded shoulders and forward head posture because the front of the neck has become tight and it feels better. If you sit like this, you can become very tight and have pain in sitting or standing with good posture. Try to be aware of your posture and sit and stand up tall to heal properly.  Follow up PT: Please let  you doctor know as soon as possible if you develop any swelling in your face or neck in the future. Lymphedema (swelling) can occur months after completion of radiation. The sooner you can let the doctor know, the sooner they can refer you back to PT. It is much easier to treat the swelling early on.   Claris Pong, PT 01/05/2023, 10:36 AM

## 2023-01-04 NOTE — Patient Instructions (Signed)

## 2023-01-05 ENCOUNTER — Ambulatory Visit: Payer: No Typology Code available for payment source | Attending: Radiation Oncology

## 2023-01-05 ENCOUNTER — Other Ambulatory Visit: Payer: Self-pay

## 2023-01-05 DIAGNOSIS — R293 Abnormal posture: Secondary | ICD-10-CM | POA: Diagnosis present

## 2023-01-05 DIAGNOSIS — C320 Malignant neoplasm of glottis: Secondary | ICD-10-CM | POA: Insufficient documentation

## 2023-01-05 NOTE — Patient Instructions (Signed)
   [facility]  [address]  [place] [address]  [phone]   Scar massage You can begin gentle scar massage to you incision sites. Gently place one hand on the incision and move the skin (without sliding on the skin) in various directions. Do this for a few minutes and then you can gently massage either coconut oil or vitamin E cream into the scars.  Home exercise Program Continue doing the exercises you were given until you feel like you can do them without feeling any tightness at the end. It is best to do them for several months after completion of radiation since the effects of radiation continue past completion.   Walking Program Studies show that 30 minutes of walking per day (fast enough to elevate your heart rate) can significantly reduce the risk of a cancer recurrence. If you can't walk due to other medical reasons, we encourage you to find another activity you could do (like a stationary bike or water exercise).  Posture After treatment for head and neck cancer, people frequently sit with rounded shoulders and forward head posture because the front of the neck has become tight and it feels better. If you sit like this, you can become very tight and have pain in sitting or standing with good posture. Try to be aware of your posture and sit and stand up tall to heal properly.  Follow up PT: Please let you doctor know as soon as possible if you develop any swelling in your face or neck in the future. Lymphedema (swelling) can occur months after completion of radiation. The sooner you can let the doctor know, the sooner they can refer you back to PT. It is much easier to treat the swelling early on.

## 2023-01-06 ENCOUNTER — Inpatient Hospital Stay: Payer: No Typology Code available for payment source

## 2023-01-06 ENCOUNTER — Ambulatory Visit: Payer: No Typology Code available for payment source | Admitting: Dietician

## 2023-01-06 ENCOUNTER — Other Ambulatory Visit: Payer: Self-pay

## 2023-01-06 VITALS — BP 113/64 | HR 62 | Temp 97.9°F | Resp 18 | Wt 189.5 lb

## 2023-01-06 DIAGNOSIS — C320 Malignant neoplasm of glottis: Secondary | ICD-10-CM | POA: Diagnosis not present

## 2023-01-06 MED ORDER — SODIUM CHLORIDE 0.9 % IV SOLN: Freq: Once | INTRAVENOUS | Status: AC

## 2023-01-06 NOTE — Patient Instructions (Signed)

## 2023-01-06 NOTE — Progress Notes (Signed)
Nutrition Follow-up:   Patient with glottis cancer. He completed 35/35 planned fractions of radiation therapy on 12/19/22 under the care of Dr. Isidore Moos   Met with patient during infusion. He reports continuing to feel rough, but this is slowly improving. Patient continues to have no taste for most foods. He was able to taste a little of chicken noodle soup/saltine crackers eaten prior to visit. Patient reports thick saliva continues however this is much better. Patient voice is stronger today.   Medications: reviewed   Labs: no new labs   Anthropometrics: Wt 189 lb 8 oz today   1/2 - 202.2 lb 12/22 - 209.2 lb    NUTRITION DIAGNOSIS: Inadequate oral intake continues    INTERVENTION:  Continue supportive care  - Pt receiving IV fluids 3x/week Suggested pt add chicken bone broth to grits for added protein/kcal Encouraged Ensure Plus HP 3x/day Support and encouragement     MONITORING, EVALUATION, GOAL: weight trends, intake   NEXT VISIT: Wednesday January 31 during IVF

## 2023-01-09 ENCOUNTER — Inpatient Hospital Stay: Payer: No Typology Code available for payment source

## 2023-01-09 ENCOUNTER — Other Ambulatory Visit: Payer: Self-pay | Admitting: Radiation Oncology

## 2023-01-09 ENCOUNTER — Other Ambulatory Visit: Payer: Self-pay

## 2023-01-09 VITALS — BP 110/62 | HR 72 | Temp 98.1°F | Resp 17

## 2023-01-09 DIAGNOSIS — C320 Malignant neoplasm of glottis: Secondary | ICD-10-CM

## 2023-01-09 LAB — CMP (CANCER CENTER ONLY)
ALT: 72 U/L — ABNORMAL HIGH (ref 0–44)
AST: 49 U/L — ABNORMAL HIGH (ref 15–41)
Albumin: 3.1 g/dL — ABNORMAL LOW (ref 3.5–5.0)
Alkaline Phosphatase: 92 U/L (ref 38–126)
Anion gap: 4 — ABNORMAL LOW (ref 5–15)
BUN: 9 mg/dL (ref 8–23)
CO2: 25 mmol/L (ref 22–32)
Calcium: 9.2 mg/dL (ref 8.9–10.3)
Chloride: 108 mmol/L (ref 98–111)
Creatinine: 1.02 mg/dL (ref 0.61–1.24)
GFR, Estimated: >60 mL/min (ref >60)
Glucose, Bld: 114 mg/dL — ABNORMAL HIGH (ref 70–99)
Potassium: 4 mmol/L (ref 3.5–5.1)
Sodium: 137 mmol/L (ref 135–145)
Total Bilirubin: 1.4 mg/dL — ABNORMAL HIGH (ref 0.3–1.2)
Total Protein: 6.7 g/dL (ref 6.5–8.1)

## 2023-01-09 MED ORDER — SODIUM CHLORIDE 0.9 % IV SOLN: Freq: Once | INTRAVENOUS | Status: AC

## 2023-01-09 NOTE — Patient Instructions (Signed)

## 2023-01-10 ENCOUNTER — Ambulatory Visit: Payer: No Typology Code available for payment source | Attending: Radiation Oncology

## 2023-01-10 DIAGNOSIS — R1310 Dysphagia, unspecified: Secondary | ICD-10-CM | POA: Insufficient documentation

## 2023-01-10 NOTE — Patient Instructions (Signed)
   Signs of Aspiration Pneumonia   Chest pain/tightness Fever (can be low grade) Cough  With foul-smelling phlegm (sputum) With sputum containing pus or blood With greenish sputum Fatigue  Shortness of breath  Wheezing   **IF YOU HAVE THESE SIGNS, CONTACT YOUR DOCTOR OR GO TO THE EMERGENCY DEPARTMENT OR URGENT CARE AS SOON AS POSSIBLE**     

## 2023-01-10 NOTE — Therapy (Signed)
OUTPATIENT SPEECH LANGUAGE PATHOLOGY ONCOLOGY TREATMENT  Patient Name: Rodney [name] MRN: [id number] DOB:[birth year], 82 y.o., male Today's Date: 01/10/2023  PCP: Jerold Coombe, MD REFERRING PROVIDER: Eppie Gibson, MD  END OF SESSION:  End of Session - 01/10/23 1107     Visit Number 3    Number of Visits 7    Date for SLP Re-Evaluation 01/25/23    SLP Start Time 1018    SLP Stop Time  1052    SLP Time Calculation (min) 34 min    Activity Tolerance Patient tolerated treatment well               Past Medical History:  Diagnosis Date   AAA (abdominal aortic aneurysm) ([place])    stent graft put in 02/2009   Arthritis    CAD (coronary artery disease)    a. cath 2010 b. CAD s/p PCI to ramus and LCX on 03/04/15   Dysrhythmia    Gout    Hyperlipidemia    Hypertension    Paroxysmal atrial fibrillation ([place])    Tobacco abuse    Past Surgical History:  Procedure Laterality Date   ABDOMINAL AORTIC ANEURYSM REPAIR  02/19/2009   performed by [place] N/A 04/28/2017   Procedure: Atrial Fibrillation Ablation;  Surgeon: Constance Haw, MD;  Location: [facility];  Service: Cardiovascular;  Laterality: N/A;   COLONOSCOPY     KNEE ARTHROSCOPY  2003   left   LEFT AND RIGHT HEART CATHETERIZATION WITH CORONARY ANGIOGRAM N/A 03/03/2015   Procedure: LEFT AND RIGHT HEART CATHETERIZATION WITH CORONARY ANGIOGRAM;  Surgeon: Troy Sine, MD;     MICROLARYNGOSCOPY N/A 09/14/2022   Procedure: MICROLARYNGOSCOPY WITH VOCAL CORD BIOPSY;  Surgeon: Izora Gala, MD;  Location: [facility];  Service: ENT;  Laterality: N/A;   PERCUTANEOUS CORONARY STENT INTERVENTION (PCI-S) N/A 03/04/2015   Procedure: PERCUTANEOUS CORONARY STENT INTERVENTION (PCI-S);  Surgeon: Troy Sine, MD; RI 99>>0% w/   BMS, bifurcation CFX-OM 90>>40% w/ Angiosculpt PTCA; dCFX 90>>0% w/   2.7520 mm Rebel BMS   SHOULDER ARTHROSCOPY WITH ROTATOR CUFF REPAIR AND SUBACROMIAL DECOMPRESSION Left  11/19/2013   Procedure: LEFT SHOULDER ARTHROSCOPY DEBRIDEMENT EXTENTSIVE DISTAL CLAVICULECTOMY DECOMPRESSION PARTIAL ACROMIOPLASTY WITH CORACOACROMIAL WITH ROTATOR CUFF REPAIR ;  Surgeon: Renette Butters, MD;  Location: [facility];  Service: Orthopedics;  Laterality: Left;   Patient Active Problem List   Diagnosis Date Noted   Malignant neoplasm of glottis ([place]) 10/12/2022   Palpitations 11/15/2021   Chest pain 07/22/2018   Abnormal EKG 07/22/2018   Nonischemic cardiomyopathy ([place]) 10/17/2017   AF (atrial fibrillation) ([place]) 04/28/2017   Premature ventricular contractions (PVCs) ([facility]) 03/11/2017   H/O endovascular stent graft for abdominal aortic aneurysm 09/01/2016   Cardiomyopathy, ischemic 03/30/2016   OSA (obstructive sleep apnea) 09/30/2015   Encounter for monitoring anti-arrhythmic therapy 05/04/2015   DOE (dyspnea on exertion) 03/16/2015   Atrial fibrillation with RVR ([place]) 03/16/2015   Hypertensive urgency 03/08/2015   Coronary artery disease due to lipid rich plaque    AA (aortic aneurysm) ([place])    Dyspnea 03/02/2015   Progressive angina ([place]) 03/02/2015   History of TIA (transient ischemic attack), 02/20/15 03/02/2015   Mitral regurgitation 03/02/2015   HX: anticoagulation 03/02/2015   Chest tightness    Tobacco abuse    HLD (hyperlipidemia)    TIA (transient ischemic attack) 02/20/2015   Leukopenia 02/20/2015   Chronic combined systolic and diastolic heart failure, NYHA class 1 (  [place]) 02/20/2015   PAF (paroxysmal atrial fibrillation) ([place]) 01/29/2015   Accelerated hypertension, hx of 01/75/1025   Acute diastolic CHF (congestive heart failure), NYHA class 2 ([place]) 01/03/2015   Demand ischemia ([place]) 01/03/2015   Coronary artery disease involving native coronary artery of native heart without angina pectoris 01/03/2015   Elevated troponin level    Thrombocytopenia ([facility])    Essential hypertension    S/P abdominal aortic aneurysm repair 07/03/2012     ONSET DATE: Sept 2023   REFERRING DIAG:   C32.0 (ICD-10-CM) - Malignant neoplasm of glottis ([place])    THERAPY DIAG:  Dysphagia, unspecified type  Rationale for Evaluation and Treatment: Rehabilitation  SUBJECTIVE:   SUBJECTIVE STATEMENT: Pt has been back to solid foods for approx 4 days.   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 Presented to PCP at the [place] in September with progressive hoarseness over the course of 1-2 years. 08/18/22 CT neck showed an irregularity of the anterior aspect of the left vocal cord with apparent extension to the left anterior hypoglottis.  At tumor board today there was also appreciation of paraglottic invasion on the left.  Examination was limited due to motion artifact. Chest CT also performed on that date showed emphysematous changes of the lungs with a few nonspecific bilateral lung nodules (measuring around 2-4 mm). CT also showed nonspecific borderline enlarged right lower paratracheal and AP window lymph nodes measuring up to 1 cm. 08/19/22 He saw Dr. Constance Holster for further evaluation. Indirect exam of the larynx performed revealed a left vocal cord mass. No palpable cervical adenopathy or masses were appreciated. Dr. Constance Holster recommended proceeding with microlaryngoscopy and biopsies of the laryngeal mass. 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  Per op note:"The larynx was normal except for the following: There is a partially exophytic mass involving the left true cord extending superiorly to the floor of the ventricle and inferiorly to the inferior aspect of the cord.  The subglottic larynx was not involved.  The lesion extended from the midportion of the left cord anteriorly to the anterior commissure and there did not appear to be involvement of the anterior commissure but no involvement of the right cord". 10/12/22 Consult with Dr. Isidore Moos. She plans 35 fractions of  radiation. 10/21/22 Consult with Dr. Chryl Heck. She has ordered a PET scan to be completed before final decision of chemotherapy is decided.  09/23/22 PET showed no evidence of metastatic disease. Treatment plan: 35 fractions of radiation to his Larynx and bilateral neck starting 10/26/22.   PAIN:  Are you having pain? No, unless swallows - then 1/10.   PATIENT GOALS: Maintain WNL swallowing  OBJECTIVE:   TREATMENT:  01/10/23: Pt is not chewing as much as he should due to ill-fitting dentures. Pt getting dentures re-sized next week. Saliva has thinned in the last 4 days and pt is now eating solid foods. Today at home pt had grits, beef tenderloin, toast. Noodle soup for lunch yesterday. Today pt ate cereal bar, applesauce, and drank water with extended time for bolus formation and incr'd oral residue both likely due to xerostomia ("smacking" heard as pt chewing indicative of drier oral tissue), but no overt s/sx of pharyngeal dysphagia noted. Suboptimal completion of HEP since last two weeks of RT. Today pt's procedure was WNL and SLP reminded pt of rationale for BID completion and scope.  SLP educated pt about s/sx aspiration PNA and pt provided three of these with modified independence. SLP will defer food journal goal as pt is returning to WNL diet quickly and from what was seen today, safely as well.   12/01/22: Pt was performing supraglottic until last week but has stopped due to pain. He cont to perform high pitch "ee", Shaker, and Masako regularly. In the past week, pt has primarily completed PT exericses and Masako with high pitch "ee" due to pain/thick saliva. SLP suggested pt cycle through all exercises one rep for each exercise that needs a swallow. With HEP today (except Shaker) pt performed exercises with mod I.  SLP had to use total A for rationale for  HEP.  He ate applesauce and drank water with WNL oral stage, and no overt s/sx pharyngeal stage deficits. Pt cannot chew food currently, due to he cannot wear lower dentures at this time.  10/27/22: Research states the risk for dysphagia increases due to radiation and/or chemotherapy treatment due to a variety of factors, so SLP educated the pt about the possibility of reduced/limited ability for PO intake during rad tx. SLP also educated pt regarding possible changes to swallowing musculature after rad tx, and why adherence to dysphagia HEP provided today and PO consumption was necessary to inhibit muscle fibrosis following rad tx and to mitigate muscle disuse atrophy. SLP informed pt why this would be detrimental to their swallowing status and to their pulmonary health. Pt demonstrated understanding of these things to SLP. SLP encouraged pt to safely eat and drink as deep into their radiation/chemotherapy as possible to provide the best possible long-term swallowing outcome for pt.    SLP then developed an individualized HEP for pt involving oral and pharyngeal and vocal  strengthening and ROM and pt was instructed how to perform these exercises, including SLP demonstration. After SLP demonstration, pt return demonstrated each exercise. SLP ensured pt performance was correct prior to educating pt on next exercise. Pt required rare min-mod cues faded to modified independent to perform HEP. Pt was instructed to complete this program 6-7 days/week, at least 2 times a day until 6 months after his or her last day of rad tx, and then x2 a week after that, indefinitely. Among other modifications for days when pt cannot functionally swallow, SLP also suggested pt to perform only non-swallowing tasks on the handout/HEP, and if necessary to cycle through the swallowing portion so the full program of exercises can be completed instead of fatiguing on one of the swallowing exercises and being unable to perform the other  swallowing exercises. SLP instructed that swallowing exercises should then be added back into the regimen as pt is able to do so. Secondly, pt was told that former patients have told SLP that during their course of radiation therapy,  taking prescribed pain medication just prior to performing HEP (and eating/drinking) has proven helpful in completing HEP (and eating and drinking) more regularly when going through their course of radiation treatment.    PATIENT EDUCATION: Education details: late effects head/neck radiation on swallow function and see "today's treatment" for more information Person educated: Patient Education method: Explanation and Handouts (pt forgot HEP handout) Education comprehension: verbalized understanding and needs further education   ASSESSMENT:  CLINICAL IMPRESSION: Patient is a 82 y.o. male who was seen today for treatment of swallowing after completion of radiation/chemoradiation therapy on 12/19/22. Today pt ate applesauce, and cereal bar, and drank thin liquids without overt s/sx pharyngeal difficulty. Oral deficits likely due to xerostomia were bolus formation and min oral residue (cleared with second swallow and liquid wash). At this time pt swallowing is deemed WNL/WFL with these types of POs. There are no overt s/s aspiration PNA observed by SLP nor any reported by pt at this time. Data indicate that pt's swallow ability will likely decrease over the course of radiation/chemoradiation therapy and could very well decline over time following the conclusion of RT due to muscle disuse atrophy and/or muscle fibrosis. Pt will cont to need to be seen by SLP in order to assess safety of PO intake, assess the need for recommending any objective swallow assessment, and ensuring pt is correctly completing the individualized HEP. Pt could decr to once every other month if, at next month appointment, progress remains.   OBJECTIVE IMPAIRMENTS: include voice disorder and dysphagia.  These impairments are limiting patient from effectively communicating at home and in community and safety when swallowing. Factors affecting potential to achieve goals and functional outcome are  none noted today . Patient will benefit from skilled SLP services to address above impairments and improve overall function.  REHAB POTENTIAL: Excellent   GOALS: Goals reviewed with patient? No  SHORT TERM GOALS: Target: 3rd total session  Pt will compelte HEP with modified independence in 2 sessions Baseline:12-01-22 Goal status: Met  2.  pt will tell SLP why pt is completing HEP with modified independence Baseline:  Goal status: Met  3.  pt will describe 3 overt s/s aspiration PNA with modified independence Baseline:  Goal status: Met  4.  pt will tell SLP how a food journal could hasten return to a more normalized diet Baseline:  Goal status: Met   LONG TERM GOALS: Target: 7th total session  pt will complete HEP with independence over two visits Baseline:  Goal status: Ongoing  2.  pt will describe how to modify HEP over time, and the timeline associated with reduction in HEP frequency with modified independence over two sessions Baseline:  Goal status: Ongoing   PLAN:  SLP FREQUENCY:  once approx every 4-6 weeks  SLP DURATION:  7 sessions  PLANNED INTERVENTIONS: Aspiration precaution training, Pharyngeal strengthening exercises, Diet toleration management , Environmental controls, Trials of upgraded texture/liquids, SLP instruction and feedback, Compensatory strategies, and Patient/family education    [facility], [place] 01/10/2023, 11:07 AM

## 2023-01-11 ENCOUNTER — Inpatient Hospital Stay: Payer: No Typology Code available for payment source

## 2023-01-11 ENCOUNTER — Inpatient Hospital Stay: Payer: No Typology Code available for payment source | Admitting: Dietician

## 2023-01-11 VITALS — BP 103/58 | HR 73 | Temp 97.9°F | Resp 16

## 2023-01-11 DIAGNOSIS — C320 Malignant neoplasm of glottis: Secondary | ICD-10-CM

## 2023-01-11 MED ORDER — SODIUM CHLORIDE 0.9 % IV SOLN: Freq: Once | INTRAVENOUS | Status: AC

## 2023-01-11 NOTE — Patient Instructions (Signed)

## 2023-01-17 ENCOUNTER — Encounter: Payer: Self-pay | Admitting: Radiation Oncology

## 2023-01-17 ENCOUNTER — Encounter: Payer: Self-pay | Admitting: Hematology and Oncology

## 2023-01-17 NOTE — Progress Notes (Signed)
Order placed for CMP prior to to pt follow up. Lab seat also scheduled.

## 2023-01-17 NOTE — Progress Notes (Signed)
Just placed the order and lab seat. Thanks Anderson Malta :)

## 2023-01-18 ENCOUNTER — Other Ambulatory Visit: Payer: Self-pay

## 2023-01-18 NOTE — Progress Notes (Signed)
Dr. Cassell Clement is his PCP at the [facility]. I just spoke to a [place] scheduler who is going to put him on his schedule for this issue.

## 2023-02-07 ENCOUNTER — Ambulatory Visit: Payer: No Typology Code available for payment source

## 2023-02-09 ENCOUNTER — Ambulatory Visit: Payer: No Typology Code available for payment source | Attending: Radiation Oncology

## 2023-02-09 DIAGNOSIS — R1310 Dysphagia, unspecified: Secondary | ICD-10-CM

## 2023-02-09 NOTE — Therapy (Addendum)
OUTPATIENT SPEECH LANGUAGE PATHOLOGY ONCOLOGY TREATMENT/recertification  Patient Name: Rodney [name] MRN: [id number] DOB:12-12-1941, 82 y.o., male Today's Date: 02/09/2023  PCP: Jerold Coombe, MD REFERRING PROVIDER: Eppie Gibson, MD  END OF SESSION:  End of Session - 02/09/23 1159     Visit Number 4    Number of Visits 7    Date for SLP Re-Evaluation 05/10/23    SLP Start Time 52    SLP Stop Time  29    SLP Time Calculation (min) 31 min    Activity Tolerance Patient tolerated treatment well                Past Medical History:  Diagnosis Date   AAA (abdominal aortic aneurysm) ([place])    stent graft put in 02/2009   Arthritis    CAD (coronary artery disease)    a. cath 2010 b. CAD s/p PCI to ramus and LCX on 03/04/15   Dysrhythmia    Gout    Hyperlipidemia    Hypertension    Paroxysmal atrial fibrillation ([place])    Tobacco abuse    Past Surgical History:  Procedure Laterality Date   ABDOMINAL AORTIC ANEURYSM REPAIR  02/19/2009   performed by [facility] N/A 04/28/2017   Procedure: Atrial Fibrillation Ablation;  Surgeon: Constance Haw, MD;  Location: [facility];  Service: Cardiovascular;  Laterality: N/A;   COLONOSCOPY     KNEE ARTHROSCOPY  2003   left   LEFT AND RIGHT HEART CATHETERIZATION WITH CORONARY ANGIOGRAM N/A 03/03/2015   Procedure: LEFT AND RIGHT HEART CATHETERIZATION WITH CORONARY ANGIOGRAM;  Surgeon: Troy Sine, MD;     MICROLARYNGOSCOPY N/A 09/14/2022   Procedure: MICROLARYNGOSCOPY WITH VOCAL CORD BIOPSY;  Surgeon: Izora Gala, MD;  Location: [facility];  Service: ENT;  Laterality: N/A;   PERCUTANEOUS CORONARY STENT INTERVENTION (PCI-S) N/A 03/04/2015   Procedure: PERCUTANEOUS CORONARY STENT INTERVENTION (PCI-S);  Surgeon: Troy Sine, MD; RI 99>>0% w/   BMS, bifurcation CFX-OM 90>>40% w/ Angiosculpt PTCA; dCFX 90>>0% w/   2.7520 mm Rebel BMS   SHOULDER ARTHROSCOPY WITH ROTATOR CUFF REPAIR AND SUBACROMIAL  DECOMPRESSION Left 11/19/2013   Procedure: LEFT SHOULDER ARTHROSCOPY DEBRIDEMENT EXTENTSIVE DISTAL CLAVICULECTOMY DECOMPRESSION PARTIAL ACROMIOPLASTY WITH CORACOACROMIAL WITH ROTATOR CUFF REPAIR ;  Surgeon: Renette Butters, MD;  Location: [facility];  Service: Orthopedics;  Laterality: Left;   Patient Active Problem List   Diagnosis Date Noted   Malignant neoplasm of glottis ([place]) 10/12/2022   Palpitations 11/15/2021   Chest pain 07/22/2018   Abnormal EKG 07/22/2018   Nonischemic cardiomyopathy ([place]) 10/17/2017   AF (atrial fibrillation) ([place]) 04/28/2017   Premature ventricular contractions (PVCs) ([facility]) 03/11/2017   H/O endovascular stent graft for abdominal aortic aneurysm 09/01/2016   Cardiomyopathy, ischemic 03/30/2016   OSA (obstructive sleep apnea) 09/30/2015   Encounter for monitoring anti-arrhythmic therapy 05/04/2015   DOE (dyspnea on exertion) 03/16/2015   Atrial fibrillation with RVR ([place]) 03/16/2015   Hypertensive urgency 03/08/2015   Coronary artery disease due to lipid rich plaque    AA (aortic aneurysm) ([place])    Dyspnea 03/02/2015   Progressive angina ([place]) 03/02/2015   History of TIA (transient ischemic attack), 02/20/15 03/02/2015   Mitral regurgitation 03/02/2015   HX: anticoagulation 03/02/2015   Chest tightness    Tobacco abuse    HLD (hyperlipidemia)    TIA (transient ischemic attack) 02/20/2015   Leukopenia 02/20/2015   Chronic combined systolic and diastolic heart failure, NYHA class  1 ([place]) 02/20/2015   PAF (paroxysmal atrial fibrillation) ([place]) 01/29/2015   Accelerated hypertension, hx of [id number]   Acute diastolic CHF (congestive heart failure), NYHA class 2 ([place]) 01/03/2015   Demand ischemia ([place]) 01/03/2015   Coronary artery disease involving native coronary artery of native heart without angina pectoris 01/03/2015   Elevated troponin level    Thrombocytopenia ([facility])    Essential hypertension    S/P abdominal aortic aneurysm repair  07/03/2012    Speech Therapy Progress Note  Dates of Reporting Period: eval date to present  Subjective Statement: pt has been seen for 4 sessions for swallowing in light of radiation tx to head and neck.  Objective: See below  Goal Update: See below  Plan: Pt will cont to be seen to ensure proper procedure with HEP and ensure swallow safety.  Reason Skilled Services are Required: Ensure pt remains appropriate with swallow functionand HEP procedure.    ONSET DATE: Sept 2023   REFERRING DIAG:   C32.0 (ICD-10-CM) - Malignant neoplasm of glottis ([place])    THERAPY DIAG:  No diagnosis found.  Rationale for Evaluation and Treatment: Rehabilitation  SUBJECTIVE:   SUBJECTIVE STATEMENT: Pt has eaten sausage, grits, fried chicken, eggs corn, collard greens, beans in last 48 hours.   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 Presented to PCP at the [facility] in September with progressive hoarseness over the course of 1-2 years. 08/18/22 CT neck showed an irregularity of the anterior aspect of the left vocal cord with apparent extension to the left anterior hypoglottis.  At tumor board today there was also appreciation of paraglottic invasion on the left.  Examination was limited due to motion artifact. Chest CT also performed on that date showed emphysematous changes of the lungs with a few nonspecific bilateral lung nodules (measuring around 2-4 mm). CT also showed nonspecific borderline enlarged right lower paratracheal and AP window lymph nodes measuring up to 1 cm. 08/19/22 He saw Dr. Constance Holster for further evaluation. Indirect exam of the larynx performed revealed a left vocal cord mass. No palpable cervical adenopathy or masses were appreciated. Dr. Constance Holster recommended proceeding with microlaryngoscopy and biopsies of the laryngeal mass. 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  Per op note:"The larynx was  normal except for the following: There is a partially exophytic mass involving the left true cord extending superiorly to the floor of the ventricle and inferiorly to the inferior aspect of the cord.  The subglottic larynx was not involved.  The lesion extended from the midportion of the left cord anteriorly to the anterior commissure and there did not appear to be involvement of the anterior commissure but no involvement of the right cord". 10/12/22 Consult with Dr. Isidore Moos. She plans 35 fractions of radiation. 10/21/22 Consult with Dr. Chryl Heck. She has ordered a PET scan to be completed before final decision of chemotherapy is decided.  09/23/22 PET showed no evidence of metastatic disease. Treatment plan: 35 fractions of radiation to his Larynx and bilateral neck starting 10/26/22.   PAIN:  Are you having pain? No, unless swallows - then 1/10.   PATIENT GOALS: Maintain WNL swallowing  OBJECTIVE:   TREATMENT:  02/09/23: Pt cont with ill-fitting dentures - dentist does not want to refit until gets auth from MD. Pt has eaten some softer foods but has also eaten more crunchy foods. Ate crackers today with extended mastication time, and without overt s/sx pharyngeal dysphagia.  HEP was completed with modified independence. Pt reports completing HEP as directed, and does so today with modified independence.   01/10/23: Pt is not chewing as much as he should due to ill-fitting dentures. Pt getting dentures re-sized next week. Saliva has thinned in the last 4 days and pt is now eating solid foods. Today at home pt had grits, beef tenderloin, toast. Noodle soup for lunch yesterday. Today pt ate cereal bar, applesauce, and drank water with extended time for bolus formation and incr'd oral residue both likely due to xerostomia ("smacking" heard as pt chewing indicative of drier  oral tissue), but no overt s/sx of pharyngeal dysphagia noted. Suboptimal completion of HEP since last two weeks of RT. Today pt's procedure was WNL and SLP reminded pt of rationale for BID completion and scope.  SLP educated pt about s/sx aspiration PNA and pt provided three of these with modified independence. SLP will defer food journal goal as pt is returning to WNL diet quickly and from what was seen today, safely as well.   12/01/22: Pt was performing supraglottic until last week but has stopped due to pain. He cont to perform high pitch "ee", Shaker, and Masako regularly. In the past week, pt has primarily completed PT exericses and Masako with high pitch "ee" due to pain/thick saliva. SLP suggested pt cycle through all exercises one rep for each exercise that needs a swallow. With HEP today (except Shaker) pt performed exercises with mod I.  SLP had to use total A for rationale for HEP.  He ate applesauce and drank water with WNL oral stage, and no overt s/sx pharyngeal stage deficits. Pt cannot chew food currently, due to he cannot wear lower dentures at this time.  10/27/22: Research states the risk for dysphagia increases due to radiation and/or chemotherapy treatment due to a variety of factors, so SLP educated the pt about the possibility of reduced/limited ability for PO intake during rad tx. SLP also educated pt regarding possible changes to swallowing musculature after rad tx, and why adherence to dysphagia HEP provided today and PO consumption was necessary to inhibit muscle fibrosis following rad tx and to mitigate muscle disuse atrophy. SLP informed pt why this would be detrimental to their swallowing status and to their pulmonary health. Pt demonstrated understanding of these things to SLP. SLP encouraged pt to safely eat and drink as deep into their radiation/chemotherapy as possible to provide the best possible long-term swallowing outcome for pt.    SLP then developed an  individualized HEP for pt involving oral and pharyngeal and vocal  strengthening and ROM and pt was instructed how to perform these exercises, including SLP demonstration. After SLP demonstration, pt return demonstrated each exercise. SLP ensured pt performance was correct prior to educating pt on next exercise. Pt required rare min-mod cues faded to modified independent to perform HEP. Pt was instructed to complete this program 6-7 days/week, at least 2 times a day until 6 months after his or her last day of rad tx, and then x2 a week after that, indefinitely. Among other modifications for days when pt cannot functionally swallow, SLP also suggested pt to perform only non-swallowing tasks on the handout/HEP, and if necessary to cycle through the  swallowing portion so the full program of exercises can be completed instead of fatiguing on one of the swallowing exercises and being unable to perform the other swallowing exercises. SLP instructed that swallowing exercises should then be added back into the regimen as pt is able to do so. Secondly, pt was told that former patients have told SLP that during their course of radiation therapy, taking prescribed pain medication just prior to performing HEP (and eating/drinking) has proven helpful in completing HEP (and eating and drinking) more regularly when going through their course of radiation treatment.    PATIENT EDUCATION: Education details: late effects head/neck radiation on swallow function and see "today's treatment" for more information Person educated: Patient Education method: Explanation and Handouts (pt forgot HEP handout) Education comprehension: verbalized understanding and needs further education   ASSESSMENT:  CLINICAL IMPRESSION: Recert today. Patient is a 82 y.o. male who was seen today for treatment of swallowing after completion of radiation/chemoradiation therapy on 12/19/22. Today pt ate crackers/chips, and cereal bar, and drank thin  liquids without overt s/sx pharyngeal difficulty. At this time pt swallowing is deemed WNL/WFL with regular diet and thin liquids There are no overt s/s aspiration PNA observed by SLP nor any reported by pt at this time. Data indicate that pt's swallow ability will likely decrease over the course of radiation/chemoradiation therapy and could very well decline over time following the conclusion of RT due to muscle disuse atrophy and/or muscle fibrosis. Pt will cont to need to be seen by SLP in order to assess safety of PO intake, assess the need for recommending any objective swallow assessment, and ensuring pt is correctly completing the individualized HEP. Pt  decr'd to once every other month.   OBJECTIVE IMPAIRMENTS: include voice disorder and dysphagia. These impairments are limiting patient from effectively communicating at home and in community and safety when swallowing. Factors affecting potential to achieve goals and functional outcome are  none noted today . Patient will benefit from skilled SLP services to address above impairments and improve overall function.  REHAB POTENTIAL: Excellent   GOALS: Goals reviewed with patient? No  SHORT TERM GOALS: Target: 3rd total session  Pt will compelte HEP with modified independence in 2 sessions Baseline:12-01-22 Goal status: Met  2.  pt will tell SLP why pt is completing HEP with modified independence Baseline:  Goal status: Met  3.  pt will describe 3 overt s/s aspiration PNA with modified independence Baseline:  Goal status: Met  4.  pt will tell SLP how a food journal could hasten return to a more normalized diet Baseline:  Goal status: Met   LONG TERM GOALS: Target: 7th total session  pt will complete HEP with independence over two visits Baseline: 02/09/23 Goal status: Ongoing  2.  pt will describe how to modify HEP over time, and the timeline associated with reduction in HEP frequency with modified independence over two  sessions Baseline:  Goal status: Ongoing   PLAN:  SLP FREQUENCY:  every other month  SLP DURATION:  7 sessions  PLANNED INTERVENTIONS: Aspiration precaution training, Pharyngeal strengthening exercises, Diet toleration management , Environmental controls, Trials of upgraded texture/liquids, SLP instruction and feedback, Compensatory strategies, and Patient/family education    [facility], [place] 02/09/2023, 12:00 PM

## 2023-03-03 ENCOUNTER — Encounter: Payer: Self-pay | Admitting: Radiation Oncology

## 2023-03-03 ENCOUNTER — Encounter: Payer: Self-pay | Admitting: Hematology and Oncology

## 2023-03-06 NOTE — Patient Instructions (Signed)
Patient is counseled regarding the importance of long term risk factor modification as they pertain to the presence of ischemic heart disease including avoiding the use of all tobacco products, dietary modifications and medical therapy for diabetes, cholesterol and lipid management, and regular exercise.   ° ° °Make every effort to maintain a "heart-healthy" lifestyle with regular physical exercise and adherence to a low-fat, low-carbohydrate diet.  Continue to seek regular follow-up appointments with your primary care physician and/or cardiologist. ° °

## 2023-03-06 NOTE — Progress Notes (Unsigned)
[address]       [place] [address]             [phone]    MARTA LAYDEN [id number] February 21, 1941  History of Present Illness:  Rodney [name] is an 82 yo male with known history of HTN, CHF, HLD, PAF, and history of tobacco abuse.  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  He recently completed radiation treatment on 12/19/2022.  He has been referred to our office for evaluation of a 4.4 cm Aortic Aneurysm.         Current Outpatient Medications on File Prior to Visit  Medication Sig Dispense Refill   amLODipine (NORVASC) 5 MG tablet Take 1 tablet (5 mg total) daily by mouth. 90 tablet 3   apixaban (ELIQUIS) 5 MG TABS tablet Take 1 tablet (5 mg total) by mouth 2 (two) times daily. 180 tablet 3   aspirin EC 81 MG tablet Take 81 mg by mouth daily.     atorvastatin (LIPITOR) 80 MG tablet Take 1 tablet (80 mg total) by mouth daily. (Patient taking differently: Take 80 mg by mouth every evening.) 90 tablet 3   carvedilol (COREG) 25 MG tablet Take 12.5 mg by mouth 2 (two) times daily with a meal.     diltiazem (CARDIZEM) 30 MG tablet TAKE 1 TABLET BY MOUTH FOUR TIMES DAILY AS NEEDED FOR IRREGULAR HEART RATE GREATER THAN 100 AND BLOOD PRESSURE GREATER THAN 100 45 tablet 6   famotidine (PEPCID) 40 MG tablet Take 1 tablet (40 mg total) by mouth 2 (two) times daily. 180 tablet 2   fish oil-omega-3 fatty acids 1000 MG capsule Take 1,000 mg by mouth in the morning.     furosemide (LASIX) 20 MG tablet Take 20 mg by mouth in the morning.     HYDROcodone-acetaminophen (HYCET) 7.5-325 mg/15 ml solution Take 10-15 mLs by mouth every 4 (four) hours as needed for moderate pain. Take with food. 473 mL 0   irbesartan (AVAPRO) 300 MG tablet TAKE 1/2 TABLET(150 MG) BY MOUTH DAILY 90 tablet 3   isosorbide mononitrate (IMDUR) 60 MG 24 hr tablet TAKE 1 TABLET BY MOUTH EVERY DAY ( CAN NOT BE FILL 06.04.2018) 90 tablet 2   KLOR-CON M10 10 MEQ tablet TAKE 1 TABLET BY  MOUTH TWICE A DAY 180 tablet 3   lidocaine (XYLOCAINE) 2 % solution Patient: Mix 1part 2% viscous lidocaine, 1part H20. Swallow 69mL of diluted mixture, 74min before meals and at bedtime, up to QID 200 mL 3   loratadine (CLARITIN) 10 MG tablet Take 10 mg by mouth in the morning.     Multiple Vitamin (MULTIVITAMIN WITH MINERALS) TABS tablet Take 1 tablet by mouth daily.     nicotine (NICODERM CQ - DOSED IN MG/24 HOURS) 14 mg/24hr patch Place 1 patch onto the skin daily.     nitroGLYCERIN (NITROSTAT) 0.4 MG SL tablet DISSOLVE 1 TABLET UNDER THE TONGUE EVERY 5 MINUTES AS NEEDED FOR CHEST PAIN( SHORTNESS OF BREATH) 25 tablet 3   omeprazole (PRILOSEC) 20 MG capsule Take 20 mg by mouth daily before breakfast.     Current Facility-Administered Medications on File Prior to Visit  Medication Dose Route Frequency Provider Last Rate Last Admin   0.9 %  sodium chloride infusion   Intravenous Once Eppie Gibson, MD         There were no vitals taken for this visit.  Physical Exam  CTA Results:  A/P:      Risk Modification:  Statin:  Yes  Smoking cessation instruction/counseling given:  {CHL AMB [facility] SMOKING CESSATION COUNSELING:20758}  Patient was counseled on importance of Blood Pressure Control.  Despite Medical intervention if the patient notices persistently elevated blood pressure readings.  They are instructed to contact their Primary Care Physician  Please avoid use of Fluoroquinolones as this can potentially increase your risk of Aortic Rupture and/or Dissection  Patient educated on signs and symptoms of Aortic Dissection, handout also provided in AVS  Zahriyah Joo, PA-C 03/06/23

## 2023-03-07 ENCOUNTER — Institutional Professional Consult (permissible substitution) (INDEPENDENT_AMBULATORY_CARE_PROVIDER_SITE_OTHER): Payer: No Typology Code available for payment source

## 2023-03-07 VITALS — BP 119/71 | HR 60 | Resp 20 | Ht 74.0 in | Wt 190.0 lb

## 2023-03-07 DIAGNOSIS — I7121 Aneurysm of the ascending aorta, without rupture: Secondary | ICD-10-CM

## 2023-03-07 NOTE — Progress Notes (Signed)
Mr. Hodgkiss presents today for follow up for completion of radiation therapy for glottic cancer, and to review PET scan results from 03/16/2023.   He completed treatment on 12-19-22.  Pain issues, if any: Denies Using a feeding tube?: N/A Weight changes, if any:  Wt Readings from Last 3 Encounters:  03/21/23 193 lb (87.5 kg)  03/07/23 190 lb (86.2 kg)  01/06/23 189 lb 8 oz (86 kg)   Swallowing issues, if any: Denies--reports he can eat and drink a wide variety Smoking or chewing tobacco? None Using fluoride trays daily? N/A--sees his communbity dentist regularly  Last ENT visit was on: Not since diagnosis  Other notable issues, if any: Continues to deal with dry mouth. He is interested in doing OP-PT to help with lymphedema management. Denies any lingering fatigue or skin concerns in treatment field. Overall, reports he feels good and is doing well

## 2023-03-09 ENCOUNTER — Ambulatory Visit: Payer: No Typology Code available for payment source | Attending: Radiation Oncology

## 2023-03-09 DIAGNOSIS — R1310 Dysphagia, unspecified: Secondary | ICD-10-CM

## 2023-03-09 NOTE — Therapy (Signed)
OUTPATIENT SPEECH LANGUAGE PATHOLOGY ONCOLOGY TREATMENT/recertification  Patient Name: Rodney [name] MRN: [id number] DOB:1941-01-21, 82 y.o., male Today's Date: 03/09/2023  PCP: Jerold Coombe, MD REFERRING PROVIDER: Eppie Gibson, MD  END OF SESSION:  End of Session - 03/09/23 0944     Visit Number 5    Number of Visits 7    Date for SLP Re-Evaluation 05/12/23    SLP Start Time 0848    SLP Stop Time  0925    SLP Time Calculation (min) 37 min    Activity Tolerance Patient tolerated treatment well                 Past Medical History:  Diagnosis Date   AAA (abdominal aortic aneurysm) ([place])    stent graft put in 02/2009   Arthritis    CAD (coronary artery disease)    a. cath 2010 b. CAD s/p PCI to ramus and LCX on 03/04/15   Dysrhythmia    Gout    Hyperlipidemia    Hypertension    Paroxysmal atrial fibrillation ([place])    Tobacco abuse    Past Surgical History:  Procedure Laterality Date   ABDOMINAL AORTIC ANEURYSM REPAIR  02/19/2009   performed by [place] N/A 04/28/2017   Procedure: Atrial Fibrillation Ablation;  Surgeon: Constance Haw, MD;  Location: [facility];  Service: Cardiovascular;  Laterality: N/A;   COLONOSCOPY     KNEE ARTHROSCOPY  2003   left   LEFT AND RIGHT HEART CATHETERIZATION WITH CORONARY ANGIOGRAM N/A 03/03/2015   Procedure: LEFT AND RIGHT HEART CATHETERIZATION WITH CORONARY ANGIOGRAM;  Surgeon: Troy Sine, MD;     MICROLARYNGOSCOPY N/A 09/14/2022   Procedure: MICROLARYNGOSCOPY WITH VOCAL CORD BIOPSY;  Surgeon: Izora Gala, MD;  Location: [facility];  Service: ENT;  Laterality: N/A;   PERCUTANEOUS CORONARY STENT INTERVENTION (PCI-S) N/A 03/04/2015   Procedure: PERCUTANEOUS CORONARY STENT INTERVENTION (PCI-S);  Surgeon: Troy Sine, MD; RI 99>>0% w/   BMS, bifurcation CFX-OM 90>>40% w/ Angiosculpt PTCA; dCFX 90>>0% w/   2.7520 mm Rebel BMS   SHOULDER ARTHROSCOPY WITH ROTATOR CUFF REPAIR AND SUBACROMIAL  DECOMPRESSION Left 11/19/2013   Procedure: LEFT SHOULDER ARTHROSCOPY DEBRIDEMENT EXTENTSIVE DISTAL CLAVICULECTOMY DECOMPRESSION PARTIAL ACROMIOPLASTY WITH CORACOACROMIAL WITH ROTATOR CUFF REPAIR ;  Surgeon: Renette Butters, MD;  Location: [facility];  Service: Orthopedics;  Laterality: Left;   Patient Active Problem List   Diagnosis Date Noted   Malignant neoplasm of glottis ([place]) 10/12/2022   Palpitations 11/15/2021   Chest pain 07/22/2018   Abnormal EKG 07/22/2018   Nonischemic cardiomyopathy ([place]) 10/17/2017   AF (atrial fibrillation) ([place]) 04/28/2017   Premature ventricular contractions (PVCs) ([facility]) 03/11/2017   H/O endovascular stent graft for abdominal aortic aneurysm 09/01/2016   Cardiomyopathy, ischemic 03/30/2016   OSA (obstructive sleep apnea) 09/30/2015   Encounter for monitoring anti-arrhythmic therapy 05/04/2015   DOE (dyspnea on exertion) 03/16/2015   Atrial fibrillation with RVR ([place]) 03/16/2015   Hypertensive urgency 03/08/2015   Coronary artery disease due to lipid rich plaque    AA (aortic aneurysm) ([place])    Dyspnea 03/02/2015   Progressive angina ([place]) 03/02/2015   History of TIA (transient ischemic attack), 02/20/15 03/02/2015   Mitral regurgitation 03/02/2015   HX: anticoagulation 03/02/2015   Chest tightness    Tobacco abuse    HLD (hyperlipidemia)    TIA (transient ischemic attack) 02/20/2015   Leukopenia 02/20/2015   Chronic combined systolic and diastolic heart failure, NYHA  class 1 ([place]) 02/20/2015   PAF (paroxysmal atrial fibrillation) ([place]) 01/29/2015   Accelerated hypertension, hx of [id number]   Acute diastolic CHF (congestive heart failure), NYHA class 2 ([place]) 01/03/2015   Demand ischemia ([place]) 01/03/2015   Coronary artery disease involving native coronary artery of native heart without angina pectoris 01/03/2015   Elevated troponin level    Thrombocytopenia ([facility])    Essential hypertension    S/P abdominal aortic aneurysm repair  07/03/2012    Speech Therapy Progress Note  Dates of Reporting Period: eval date to present  Subjective Statement: pt has been seen for 5 sessions for swallowing in light of radiation tx to head and neck.  Objective: See below  Goal Update: See below  Plan: Pt will cont to be seen to ensure proper procedure with HEP and ensure swallow safety. Likely no more than 2 more visits.  Reason Skilled Services are Required: Ensure pt remains appropriate with swallow functionand HEP procedure.    ONSET DATE: Sept 2023   REFERRING DIAG:   C32.0 (ICD-10-CM) - Malignant neoplasm of glottis ([place])    THERAPY DIAG:  Dysphagia, unspecified type  Rationale for Evaluation and Treatment: Rehabilitation  SUBJECTIVE:   SUBJECTIVE STATEMENT: Pt has eaten sausage, toast, beans, crackers and peanut butter in last 48 hours.   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 Presented to PCP at the [facility] in September with progressive hoarseness over the course of 1-2 years. 08/18/22 CT neck showed an irregularity of the anterior aspect of the left vocal cord with apparent extension to the left anterior hypoglottis.  At tumor board today there was also appreciation of paraglottic invasion on the left.  Examination was limited due to motion artifact. Chest CT also performed on that date showed emphysematous changes of the lungs with a few nonspecific bilateral lung nodules (measuring around 2-4 mm). CT also showed nonspecific borderline enlarged right lower paratracheal and AP window lymph nodes measuring up to 1 cm. 08/19/22 He saw Dr. Constance Holster for further evaluation. Indirect exam of the larynx performed revealed a left vocal cord mass. No palpable cervical adenopathy or masses were appreciated. Dr. Constance Holster recommended proceeding with microlaryngoscopy and biopsies of the laryngeal mass. 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   Per op note:"The larynx was normal except for the following: There is a partially exophytic mass involving the left true cord extending superiorly to the floor of the ventricle and inferiorly to the inferior aspect of the cord.  The subglottic larynx was not involved.  The lesion extended from the midportion of the left cord anteriorly to the anterior commissure and there did not appear to be involvement of the anterior commissure but no involvement of the right cord". 10/12/22 Consult with Dr. Isidore Moos. She plans 35 fractions of radiation. 10/21/22 Consult with Dr. Chryl Heck. She has ordered a PET scan to be completed before final decision of chemotherapy is decided.  09/23/22 PET showed no evidence of metastatic disease. Treatment plan: 35 fractions of radiation to his Larynx and bilateral neck starting 10/26/22.   PAIN:  Are you having pain? No, unless swallows - then 1/10.   PATIENT GOALS: Maintain WNL swallowing  OBJECTIVE:   TREATMENT:  03/09/23: HEP completed "probably what you want me to do" re: reps/frequency. SLP stressed minimum 20/day x6 days a week. SLP reiterated rationale for HEP to pt. He completed HEP with independence. Today he ate fig bar with extended mastication time and drank water without overt s/sx oral or pharyngeal dysphagia. Likely d/c next session.  02/09/23: Pt cont with ill-fitting dentures - dentist does not want to refit until gets auth from MD. Pt has eaten some softer foods but has also eaten more crunchy foods. Ate crackers today with extended mastication time, and without overt s/sx pharyngeal dysphagia.  HEP was completed with modified independence. Pt reports completing HEP as directed, and does so today with modified independence.   01/10/23: Pt is not chewing as much as he should due to ill-fitting dentures. Pt getting dentures  re-sized next week. Saliva has thinned in the last 4 days and pt is now eating solid foods. Today at home pt had grits, beef tenderloin, toast. Noodle soup for lunch yesterday. Today pt ate cereal bar, applesauce, and drank water with extended time for bolus formation and incr'd oral residue both likely due to xerostomia ("smacking" heard as pt chewing indicative of drier oral tissue), but no overt s/sx of pharyngeal dysphagia noted. Suboptimal completion of HEP since last two weeks of RT. Today pt's procedure was WNL and SLP reminded pt of rationale for BID completion and scope.  SLP educated pt about s/sx aspiration PNA and pt provided three of these with modified independence. SLP will defer food journal goal as pt is returning to WNL diet quickly and from what was seen today, safely as well.   12/01/22: Pt was performing supraglottic until last week but has stopped due to pain. He cont to perform high pitch "ee", Shaker, and Masako regularly. In the past week, pt has primarily completed PT exericses and Masako with high pitch "ee" due to pain/thick saliva. SLP suggested pt cycle through all exercises one rep for each exercise that needs a swallow. With HEP today (except Shaker) pt performed exercises with mod I.  SLP had to use total A for rationale for HEP.  He ate applesauce and drank water with WNL oral stage, and no overt s/sx pharyngeal stage deficits. Pt cannot chew food currently, due to he cannot wear lower dentures at this time.  10/27/22: Research states the risk for dysphagia increases due to radiation and/or chemotherapy treatment due to a variety of factors, so SLP educated the pt about the possibility of reduced/limited ability for PO intake during rad tx. SLP also educated pt regarding possible changes to swallowing musculature after rad tx, and why adherence to dysphagia HEP provided today and PO consumption was necessary to inhibit muscle fibrosis following rad tx and to mitigate muscle  disuse atrophy. SLP informed pt why this would be detrimental to their swallowing status and to their pulmonary health. Pt demonstrated understanding of these things to SLP. SLP encouraged pt to safely eat and drink as deep into their radiation/chemotherapy as possible to provide the best possible long-term swallowing outcome for pt.    SLP then developed an individualized HEP for pt involving oral and pharyngeal and vocal  strengthening and ROM and pt was instructed how to perform these exercises, including SLP demonstration. After SLP demonstration, pt return demonstrated each exercise. SLP ensured pt performance was correct prior to educating pt on next exercise. Pt required rare min-mod cues faded to modified independent to perform HEP. Pt was instructed to complete this program 6-7  days/week, at least 2 times a day until 6 months after his or her last day of rad tx, and then x2 a week after that, indefinitely. Among other modifications for days when pt cannot functionally swallow, SLP also suggested pt to perform only non-swallowing tasks on the handout/HEP, and if necessary to cycle through the swallowing portion so the full program of exercises can be completed instead of fatiguing on one of the swallowing exercises and being unable to perform the other swallowing exercises. SLP instructed that swallowing exercises should then be added back into the regimen as pt is able to do so. Secondly, pt was told that former patients have told SLP that during their course of radiation therapy, taking prescribed pain medication just prior to performing HEP (and eating/drinking) has proven helpful in completing HEP (and eating and drinking) more regularly when going through their course of radiation treatment.    PATIENT EDUCATION: Education details: late effects head/neck radiation on swallow function and see "today's treatment" for more information Person educated: Patient and Spouse Education method:  Explanation and Handouts (pt forgot HEP handout) Education comprehension: verbalized understanding and needs further education   ASSESSMENT:  CLINICAL IMPRESSION: Recert today. Every month appointments preferred.  Patient is a 82 y.o. male who was seen today for treatment of swallowing after completion of radiation/chemoradiation therapy on 12/19/22. Today pt ate  fig bar , and drank thin liquids without overt s/sx pharyngeal difficulty. Fig bar resulted in extrended mastication time. At this time pt swallowing is deemed WNL/WFL with regular diet and thin liquids There are no overt s/s aspiration PNA observed by SLP nor any reported by pt at this time. Data indicate that pt's swallow ability will likely decrease over the course of radiation/chemoradiation therapy and could very well decline over time following the conclusion of RT due to muscle disuse atrophy and/or muscle fibrosis. Pt will cont to need to be seen by SLP in order to assess safety of PO intake, assess the need for recommending any objective swallow assessment, and ensuring pt is correctly completing the individualized HEP. Likely d/c after next visit.  OBJECTIVE IMPAIRMENTS: include dysphagia. These impairments are limiting patient from safety when swallowing. Factors affecting potential to achieve goals and functional outcome are  none noted today . Patient will benefit from skilled SLP services to address above impairments and improve overall function.  REHAB POTENTIAL: Excellent   GOALS: Goals reviewed with patient? No  SHORT TERM GOALS: Target: 3rd total session  Pt will compelte HEP with modified independence in 2 sessions Baseline:12-01-22 Goal status: Met  2.  pt will tell SLP why pt is completing HEP with modified independence Baseline:  Goal status: Met  3.  pt will describe 3 overt s/s aspiration PNA with modified independence Baseline:  Goal status: Met  4.  pt will tell SLP how a food journal could hasten  return to a more normalized diet Baseline:  Goal status: Met   LONG TERM GOALS: Target: 7th total session  pt will complete HEP with independence over two visits Baseline: 02/09/23 Goal status: Met  2.  pt will describe how to modify HEP over time, and the timeline associated with reduction in HEP frequency with modified independence over two sessions Baseline:  Goal status: Ongoing   PLAN:  SLP FREQUENCY:  approx every 4 weeks  SLP DURATION:  7 sessions  PLANNED INTERVENTIONS: Aspiration precaution training, Pharyngeal strengthening exercises, Diet toleration management , Environmental controls, Trials of upgraded texture/liquids, SLP instruction and  feedback, Compensatory strategies, and Patient/family education    [facility], [place] 03/09/2023, 9:47 AM

## 2023-03-10 ENCOUNTER — Other Ambulatory Visit: Payer: Self-pay

## 2023-03-10 DIAGNOSIS — C320 Malignant neoplasm of glottis: Secondary | ICD-10-CM

## 2023-03-14 ENCOUNTER — Other Ambulatory Visit: Payer: Self-pay

## 2023-03-16 ENCOUNTER — Encounter (HOSPITAL_COMMUNITY)
Admission: RE | Admit: 2023-03-16 | Discharge: 2023-03-16 | Disposition: A | Discharge status: Home / Self Care | Payer: No Typology Code available for payment source | Source: Ambulatory Visit | Attending: Radiology | Admitting: Radiology

## 2023-03-16 DIAGNOSIS — C329 Malignant neoplasm of larynx, unspecified: Secondary | ICD-10-CM | POA: Diagnosis present

## 2023-03-16 LAB — GLUCOSE, CAPILLARY: Glucose-Capillary: 91 mg/dL (ref 70–99)

## 2023-03-16 MED ORDER — FLUDEOXYGLUCOSE F - 18 (FDG) INJECTION
9.5000 | Freq: Once | INTRAVENOUS | Status: AC
  Administered 2023-03-16: 9.5 via INTRAVENOUS

## 2023-03-20 ENCOUNTER — Telehealth: Payer: Self-pay | Admitting: *Deleted

## 2023-03-20 NOTE — Telephone Encounter (Signed)
CALLED PATIENT TO REMIND OF LAB AND FU FOR 03-21-23, LAB- 1:45 PM ON 03-21-23 AND FU APPT. WITH DR. Basilio Cairo FOR 03-21-23 @ 2:20 PM, SPOKE WITH PATIENT AND HE IS AWARE OF THESE APPTS.

## 2023-03-21 ENCOUNTER — Other Ambulatory Visit: Payer: Self-pay

## 2023-03-21 ENCOUNTER — Ambulatory Visit
Admission: RE | Admit: 2023-03-21 | Discharge: 2023-03-21 | Disposition: A | Discharge status: Home / Self Care | Payer: No Typology Code available for payment source | Source: Ambulatory Visit | Attending: Radiation Oncology | Admitting: Radiation Oncology

## 2023-03-21 VITALS — BP 118/66 | HR 56 | Temp 96.3°F | Resp 18 | Ht 74.0 in | Wt 193.0 lb

## 2023-03-21 DIAGNOSIS — C320 Malignant neoplasm of glottis: Secondary | ICD-10-CM

## 2023-03-21 DIAGNOSIS — Z1329 Encounter for screening for other suspected endocrine disorder: Secondary | ICD-10-CM

## 2023-03-21 LAB — CMP (CANCER CENTER ONLY)
ALT: 24 U/L (ref 0–44)
AST: 22 U/L (ref 15–41)
Albumin: 3.5 g/dL (ref 3.5–5.0)
Alkaline Phosphatase: 81 U/L (ref 38–126)
Anion gap: 3 — ABNORMAL LOW (ref 5–15)
BUN: 10 mg/dL (ref 8–23)
CO2: 27 mmol/L (ref 22–32)
Calcium: 9 mg/dL (ref 8.9–10.3)
Chloride: 110 mmol/L (ref 98–111)
Creatinine: 1.01 mg/dL (ref 0.61–1.24)
GFR, Estimated: >60 mL/min (ref >60)
Glucose, Bld: 101 mg/dL — ABNORMAL HIGH (ref 70–99)
Potassium: 4.1 mmol/L (ref 3.5–5.1)
Sodium: 140 mmol/L (ref 135–145)
Total Bilirubin: 0.6 mg/dL (ref 0.3–1.2)
Total Protein: 6.9 g/dL (ref 6.5–8.1)

## 2023-03-21 MED ORDER — OXYMETAZOLINE HCL 0.05 % NA SOLN
2.0000 | Freq: Once | NASAL | Status: AC
  Administered 2023-03-21: 2 via NASAL
  Filled 2023-03-21: qty 30

## 2023-03-21 NOTE — Progress Notes (Signed)
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 He completed treatment on 12/19/22.  First Treatment Date: 2022-10-26 - Last Treatment Date: 2022-12-19   Plan Name: HN_Larynx Site: Larynx Technique: IMRT Mode: Photon Dose Per Fraction: 2 Gy Prescribed Dose (Delivered / Prescribed): 70 Gy / 70 Gy Prescribed Fxs (Delivered / Prescribed): 35 / 35  Narrative:  The patient returns today for routine follow-up.  PET on 03/16/23 showed interval resolution of metabolic activity in the anterior left vocal cord; symmetric metabolic activity at the level of the arytenoid cartilage/musculature, favored to be benign post therapy inflammation; no evidence of metastatic disease was seen.            Pain issues, if any: Denies Using a feeding tube?: N/A Weight changes, if any:     Wt Readings from Last 3 Encounters:  03/21/23 193 lb (87.5 kg)  03/07/23 190 lb (86.2 kg)  01/06/23 189 lb 8 oz (86 kg)  Swallowing issues, if any: Denies--reports he can eat and drink a wide variety Smoking or chewing tobacco? None Using fluoride trays daily? N/A--sees his community dentist regularly  Last ENT visit was on: Not since diagnosis   Other notable issues, if any: Continues to deal with dry mouth. He is interested in doing OP-PT to help with lymphedema management. Denies any lingering fatigue or skin concerns in treatment field. Overall, reports he feels good and is doing well   ALLERGIES:  is allergic to lisinopril and iohexol.  Meds: Current  Outpatient Medications  Medication Sig Dispense Refill   amLODipine (NORVASC) 5 MG tablet Take 1 tablet (5 mg total) daily by mouth. 90 tablet 3   apixaban (ELIQUIS) 5 MG TABS tablet Take 1 tablet (5 mg total) by mouth 2 (two) times daily. 180 tablet 3   aspirin EC 81 MG tablet Take 81 mg by mouth daily.     atorvastatin (LIPITOR) 80 MG tablet Take 1 tablet (80 mg total) by mouth daily. (Patient taking differently: Take 80 mg by mouth every evening.) 90 tablet 3   carvedilol (COREG) 25 MG tablet Take 12.5 mg by mouth 2 (two) times daily with a meal.     diltiazem (CARDIZEM) 30 MG tablet TAKE 1 TABLET BY MOUTH FOUR TIMES DAILY AS NEEDED FOR IRREGULAR HEART RATE GREATER THAN 100 AND BLOOD PRESSURE GREATER THAN 100 45 tablet 6   famotidine (PEPCID) 40 MG tablet Take 1 tablet (40 mg total) by mouth 2 (two) times daily. 180 tablet 2   fish oil-omega-3 fatty acids 1000 MG capsule Take 1,000 mg by mouth in the morning.     furosemide (LASIX) 20 MG tablet Take 20 mg by mouth in the morning.     HYDROcodone-acetaminophen (HYCET) 7.5-325 mg/15 ml solution Take 10-15 mLs by mouth every 4 (four) hours as needed for moderate pain. Take with food. 473 mL 0   irbesartan (AVAPRO) 300 MG tablet TAKE 1/2 TABLET(150 MG) BY MOUTH DAILY 90 tablet 3   isosorbide mononitrate (  IMDUR) 60 MG 24 hr tablet TAKE 1 TABLET BY MOUTH EVERY DAY ( CAN NOT BE FILL 06.04.2018) 90 tablet 2   KLOR-CON M10 10 MEQ tablet TAKE 1 TABLET BY MOUTH TWICE A DAY 180 tablet 3   lidocaine (XYLOCAINE) 2 % solution Patient: Mix 1part 2% viscous lidocaine, 1part H20. Swallow 66mL of diluted mixture, before meals and at bedtime, up to QID 200 mL 3   loratadine (CLARITIN) 10 MG tablet Take 10 mg by mouth in the morning.     Multiple Vitamin (MULTIVITAMIN WITH MINERALS) TABS tablet Take 1 tablet by mouth daily.     nicotine (NICODERM CQ - DOSED IN MG/24 HOURS) 14 mg/24hr patch Place 1 patch onto the skin daily.     nitroGLYCERIN (NITROSTAT) 0.4  MG SL tablet DISSOLVE 1 TABLET UNDER THE TONGUE EVERY 5 MINUTES AS NEEDED FOR CHEST PAIN( SHORTNESS OF BREATH) 25 tablet 3   omeprazole (PRILOSEC) 20 MG capsule Take 20 mg by mouth daily before breakfast.     No current facility-administered medications for this encounter.   Facility-Administered Medications Ordered in Other Encounters  Medication Dose Route Frequency Provider Last Rate Last Admin   0.9 %  sodium chloride infusion   Intravenous Once Lonie Peak, MD        Physical Findings: The patient is in no acute distress. Patient is alert and oriented. Wt Readings from Last 3 Encounters:  03/21/23 193 lb (87.5 kg)  03/07/23 190 lb (86.2 kg)  01/06/23 189 lb 8 oz (86 kg)    height is 6\' 2"  (1.88 m) and weight is 193 lb (87.5 kg). His temporal temperature is 96.3 F (35.7 C) (abnormal). His blood pressure is 118/66 and his pulse is 56 (abnormal). His respiration is 18 and oxygen saturation is 98%. .  General: Alert and oriented, in no acute distress HEENT: Head is normocephalic. Extraocular movements are intact. Oropharynx is notable for no notable lesions or masses.  Neck: Neck is notable for diffuse anterior lymphedema. Neck circumference measured at 19 inches. Skin: Skin in treatment fields shows satisfactory healing around the neck.  Heart: Regular in rate and rhythm with no murmurs, rubs, or gallops. Chest: Clear to auscultation bilaterally, with no rhonchi, wheezes, or rales. Abdomen: Soft, nontender, nondistended, with no rigidity or guarding. Extremities: No cyanosis or edema. Lymphatics: see Neck Exam Psychiatric: Judgment and insight are intact. Affect is appropriate.  PROCEDURE NOTE: After obtaining consent and spraying nasal cavity with topical oxymetazoline, the flexible endoscope was coating in lidocaine gel and introduced and passed through the nasal cavity.  Excess mucous. The nasopharynx, oropharynx, hypopharynx, and larynx  were then examined. No lesions  appreciated in the mucosal axis. The true cords were symmetrically mobile.    Lab Findings: Lab Results  Component Value Date   WBC 2.5 (L) 01/02/2023   HGB 12.4 (L) 01/02/2023   HCT 36.0 (L) 01/02/2023   MCV 95.5 01/02/2023   PLT 131 (L) 01/02/2023    Lab Results  Component Value Date   TSH 0.522 10/21/2022    Radiographic Findings: NM PET Image Restag (PS) Skull Base To Thigh  Result Date: 03/16/2023 CLINICAL DATA:  Subsequent treatment strategy for head neck carcinoma. EXAM: NUCLEAR MEDICINE PET SKULL BASE TO THIGH TECHNIQUE: 9.5 mCi F-18 FDG was injected intravenously. Full-ring PET imaging was performed from the skull base to thigh after the radiotracer. CT data was obtained and used for attenuation correction and anatomic localization. Fasting blood glucose: 91 mg/dl COMPARISON:  PET-CT  scan 10/26/2022 FINDINGS: Mediastinal blood pool activity: SUV max Liver activity: SUV max NA NECK: Interval resolution of metabolic activity previous localizing to anterior LEFT vocal cord. There is new symmetric metabolic activity at the level of the arytenoid cartilage/musculature which is favored benign post therapy inflammation (image 51). No hypermetabolic cervical or supraclavicular nodes. Incidental CT findings: None. CHEST: No hypermetabolic mediastinal or hilar nodes. No suspicious pulmonary nodules on the CT scan. Incidental CT findings: Interstitial thickening along the fissure is similar to prior. Centrilobular emphysema the upper lobes again noted. Mild bibasilar bronchiectasis. No suspicious nodularity. ABDOMEN/PELVIS: No abnormal hypermetabolic activity within the liver, pancreas, adrenal glands, or spleen. No hypermetabolic lymph nodes in the abdomen or pelvis. Incidental CT findings: Aorto bi-iliac stent graft noted. SKELETON: No focal hypermetabolic activity to suggest skeletal metastasis. Incidental CT findings: None. IMPRESSION: 1. Interval resolution of metabolic activity in the  anterior LEFT vocal cord. 2. Symmetric metabolic activity at the level of the arytenoid cartilage/musculature is favored benign post therapy inflammation. 3. No evidence of metastatic disease in the neck, chest, abdomen, or pelvis. 4.  Emphysema (ICD10-J43.9). Electronically Signed   By: Genevive Bi M.D.   On: 03/16/2023 16:02    Impression/Plan:    1) Head and Neck Cancer Status: Patient is healing well from the effects of radiation therapy. Recent PET scan and scope/PE show favorable response to treatment. The patient and his family were very happy with this news.   2) Nutritional Status: Weight has been stable.    Wt Readings from Last 3 Encounters:  03/21/23 193 lb (87.5 kg)  03/07/23 190 lb (86.2 kg)  01/06/23 189 lb 8 oz (86 kg)  PEG tube: N/A  3) Risk Factors: The patient has been educated about risk factors including alcohol and tobacco abuse; they understand that avoidance of alcohol and tobacco is important to prevent recurrences as well as other cancers. Patient is not smoking.   4) Swallowing: Patient is eating and drinking a wide variety of foods.   5) Dental: Encouraged to continue regular followup with dentistry, and dental hygiene including fluoride rinses.  6) Thyroid function: Will get repeat TSH at 6 month f/up. Lab Results  Component Value Date   TSH 0.522 10/21/2022    7) Other: Lymphedema: Patient would benefit from PT. Neck circumference was measured at 19 inches today- we are trying to get [facility] approval for PT referral.  8) Patient will be seen by ENT in 3 months and follow-up with me in 6 months. The patient was encouraged to call with any issues or questions before then.   On date of service, in total, I spent 20 minutes on this encounter. Patient was seen in person. Note signed after date of encounter ; minutes pertain to date of service only. _____________________________________   Joyice Faster, PA    Lonie Peak, MD

## 2023-03-22 ENCOUNTER — Other Ambulatory Visit: Payer: Self-pay

## 2023-03-22 NOTE — Progress Notes (Signed)
Oncology Nurse Navigator Documentation   I met with Mr. Samborski and his wife before and during his follow up with Dr. Basilio Cairo. I measured his neck circumference to be added to the Providers note to assist with PT referral authorization with the [place]. He is doing well at this time, recovering from radiation treatment.   I have sent a fax to [facility] ENT Scheduling with request Mr. Soltau be contacted and scheduled for routine post-RT follow-up with Dr. Pollyann Kennedy in 3 months.  Notification of successful fax transmission received.   Mr. Seamster knows to call me if he has any questions in the future.   Hedda Slade RN, BSN, OCN Head & Neck Oncology Nurse Navigator [facility] at [facility] Phone # [phone]  Fax # [phone]

## 2023-03-22 NOTE — Progress Notes (Signed)
Oncology Nurse Navigator Documentation   I have faxed Dr. Colletta Maryland [facility] lymphedema and circumference of his neck and a Physical Therapy referral request. I will follow for authorization.  Hedda Slade RN, BSN, OCN Head & Neck Oncology Nurse Navigator [facility] at [facility] Phone # [phone]  Fax # [phone]

## 2023-03-30 ENCOUNTER — Telehealth: Payer: Self-pay

## 2023-03-30 NOTE — Telephone Encounter (Signed)
Encounter opened in error

## 2023-03-31 ENCOUNTER — Ambulatory Visit: Payer: No Typology Code available for payment source

## 2023-04-10 ENCOUNTER — Other Ambulatory Visit: Payer: Self-pay

## 2023-04-10 ENCOUNTER — Ambulatory Visit: Payer: No Typology Code available for payment source | Attending: Radiation Oncology | Admitting: Physical Therapy

## 2023-04-10 ENCOUNTER — Encounter: Payer: Self-pay | Admitting: Physical Therapy

## 2023-04-10 DIAGNOSIS — C320 Malignant neoplasm of glottis: Secondary | ICD-10-CM | POA: Insufficient documentation

## 2023-04-10 DIAGNOSIS — I890 Lymphedema, not elsewhere classified: Secondary | ICD-10-CM

## 2023-04-10 DIAGNOSIS — L599 Disorder of the skin and subcutaneous tissue related to radiation, unspecified: Secondary | ICD-10-CM | POA: Diagnosis present

## 2023-04-10 NOTE — Therapy (Addendum)
OUTPATIENT PHYSICAL THERAPY HEAD AND NECK LYMPHEDEMA   Patient Name: Rodney [name] MRN: [id number] DOB:31-Dec-1940, 82 y.o., male Today's Date: 04/10/2023  END OF SESSION:  PT End of Session - 04/10/23 0950     Visit Number 1    Number of Visits 9    Date for PT Re-Evaluation 05/08/23    PT Start Time 0907    PT Stop Time 0943    PT Time Calculation (min) 36 min    Activity Tolerance Patient tolerated treatment well    Behavior During Therapy [facility] for tasks assessed/performed              Past Medical History:  Diagnosis Date   AAA (abdominal aortic aneurysm) (HCC)    stent graft put in 02/2009   Arthritis    CAD (coronary artery disease)    a. cath 2010 b. CAD s/p PCI to ramus and LCX on 03/04/15   Dysrhythmia    Gout    Hyperlipidemia    Hypertension    Paroxysmal atrial fibrillation (HCC)    Tobacco abuse    Past Surgical History:  Procedure Laterality Date   ABDOMINAL AORTIC ANEURYSM REPAIR  02/19/2009   performed by [facility]   ATRIAL FIBRILLATION ABLATION N/A 04/28/2017   Procedure: Atrial Fibrillation Ablation;  Surgeon: Regan Lemming, MD;  Location: [facility] INVASIVE CV LAB;  Service: Cardiovascular;  Laterality: N/A;   COLONOSCOPY     KNEE ARTHROSCOPY  2003   left   LEFT AND RIGHT HEART CATHETERIZATION WITH CORONARY ANGIOGRAM N/A 03/03/2015   Procedure: LEFT AND RIGHT HEART CATHETERIZATION WITH CORONARY ANGIOGRAM;  Surgeon: Lennette Bihari, MD;     MICROLARYNGOSCOPY N/A 09/14/2022   Procedure: MICROLARYNGOSCOPY WITH VOCAL CORD BIOPSY;  Surgeon: Serena Colonel, MD;  Location: [facility] OR;  Service: ENT;  Laterality: N/A;   PERCUTANEOUS CORONARY STENT INTERVENTION (PCI-S) N/A 03/04/2015   Procedure: PERCUTANEOUS CORONARY STENT INTERVENTION (PCI-S);  Surgeon: Lennette Bihari, MD; RI 99>>0% w/   BMS, bifurcation CFX-OM 90>>40% w/ Angiosculpt PTCA; dCFX 90>>0% w/   2.7520 mm Rebel BMS   SHOULDER ARTHROSCOPY WITH ROTATOR CUFF REPAIR AND SUBACROMIAL DECOMPRESSION Left 11/19/2013    Procedure: LEFT SHOULDER ARTHROSCOPY DEBRIDEMENT EXTENTSIVE DISTAL CLAVICULECTOMY DECOMPRESSION PARTIAL ACROMIOPLASTY WITH CORACOACROMIAL WITH ROTATOR CUFF REPAIR ;  Surgeon: [name], MD;  Location: [facility];  Service: Orthopedics;  Laterality: Left;   Patient Active Problem List   Diagnosis Date Noted   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   Palpitations 11/15/2021   Chest pain 07/22/2018   Abnormal EKG 07/22/2018   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   AF (atrial fibrillation) (HCC) 04/28/2017   Premature ventricular contractions (PVCs) (VPCs) 03/11/2017   H/O endovascular stent graft for abdominal aortic aneurysm 09/01/2016   Cardiomyopathy, ischemic 03/30/2016   OSA (obstructive sleep apnea) 09/30/2015   Encounter for monitoring anti-arrhythmic therapy 05/04/2015   DOE (dyspnea on exertion) 03/16/2015   Atrial fibrillation with RVR (HCC) 03/16/2015   Hypertensive urgency 03/08/2015   Coronary artery disease due to lipid rich plaque    AA (aortic aneurysm) (HCC)    Dyspnea 03/02/2015   Progressive angina (HCC) 03/02/2015   History of TIA (transient ischemic attack), 02/20/15 03/02/2015   Mitral regurgitation 03/02/2015   HX: anticoagulation 03/02/2015   Chest tightness    Tobacco abuse    HLD (hyperlipidemia)    TIA (transient ischemic attack) 02/20/2015   Leukopenia 02/20/2015   Chronic combined systolic and diastolic heart failure, NYHA class 1 (  HCC) 02/20/2015   PAF (paroxysmal atrial fibrillation) (HCC) 01/29/2015   Accelerated hypertension, hx of 01/03/2015   Acute diastolic CHF (congestive heart failure), NYHA class 2 (HCC) 01/03/2015   Demand ischemia (HCC) 01/03/2015   Coronary artery disease involving native coronary artery of native heart without angina pectoris 01/03/2015   Elevated troponin level    Thrombocytopenia (HCC)    Essential hypertension    S/P abdominal aortic aneurysm repair 07/03/2012    PCP: Burton Apley, MD, Dr. Alphonsa Gin at the [facility]  REFERRING PROVIDER: Lonie Peak, MD  REFERRING DIAG: C32.0 (ICD-10-CM) - Malignant neoplasm of glottis [facility])   THERAPY DIAG:  Lymphedema, not elsewhere classified  Disorder of the skin and subcutaneous tissue related to radiation, unspecified  Malignant neoplasm of glottis [facility])  Rationale for Evaluation and Treatment: Rehabilitation  ONSET DATE: 08/18/2022  SUBJECTIVE:                                                                                                                                                                                           SUBJECTIVE STATEMENT: I started having some swelling after radiation in January. It aggravates me a little bit. I think it is getting worse. I think it is starting to affect my ability to swallow.   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 He presented to his PCP at the [place] in September with progressive hoarseness over the course of 1-2 years. 08/18/22 CT neck showed an irregularity of the anterior aspect of the left vocal cord with apparent extension to the left anterior hypoglottis.  At tumor board today there was also appreciation of paraglottic invasion on the left.  Examination was limited due to motion artifact. Chest CT also performed on that date showed emphysematous changes of the lungs with a few nonspecific bilateral lung nodules (measuring around 2-4 mm). CT also showed nonspecific borderline enlarged right lower paratracheal and AP window lymph nodes measuring up to 1 cm. 08/19/22 He saw Dr. Pollyann Kennedy for further evaluation. Indirect exam of the larynx performed revealed a left vocal cord mass. No palpable cervical adenopathy or masses were appreciated. Dr. 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  Per op note:"The larynx was normal except for the  following: There is a partially exophytic mass involving the left true cord extending superiorly to the floor of the ventricle and inferiorly to the inferior aspect of the cord.  The subglottic larynx was not involved.  The lesion extended from the midportion of the left cord anteriorly to the anterior  commissure and there did not appear to be involvement of the anterior commissure but no involvement of the right cord". He received 35 fractions of radiation to his Larynx and bilateral neck starting 10/26/22 and ending 12/19/2022. No Chemo was required as  11/15 PET was clear.   PATIENT GOALS: want to get swelling down  PAIN:  Are you having pain? No  PRECAUTIONS: Recent radiation, Head and neck lymphedema risk,    OBJECTIVE:   POSTURE:  Forward head and rounded shoulders posture  SHOULDER AROM:   WFLRTC surgery on left approx 10 years ago.  CERVICAL AROM:     EVAL on 10/27/22 01/05/2023 EVAL on 04/10/23  Flexion WFL WNL 25% limited  Extension 25% limited 15% limited 15% limited  Right lateral flexion 75% limited 55 % limited 80% limited  Left lateral flexion 75% limited 65% 80% limited  Right rotation 35% limited 30% limited 25% limited  Left rotation 35% limited 35%limited 25% limited                          (Blank rows=not tested)   GAIT: WNL but forward head and looking down   LYMPHEDEMA ASSESSMENT:    Circumference in cm 01/05/23 04/10/23 (pt has gained 20lbs since 12/2022)  4 cm superior to sternal notch around neck 38.3 43  6 cm superior to sternal notch around neck 38.5 44.6  8 cm superior to sternal notch around neck 38.8 46  R lateral nostril from base of nose to medial tragus    L lateral nostril from base of nose to medial tragus    R corner of mouth to where ear lobe meets face 10   L corner of mouth to where ear lobe meets face 10           (Blank rows=not tested)  CURRENT/PAST TREATMENTS:  Surgery type/date: 09/14/2022 Microlaryngoscopy/biopsy  Chemotherapy:  none  Radiation:35 fractions to larynx and bilateral neck starting 10/26/2022 and ending 12/19/2022  OTHER SYMPTOMS: Pain No Fibrosis No Pitting edema No Infections No Decreased scar mobility No  LLIS:  35.92  TREATMENT PERFORMED: 04/10/23: Created foam chip pack for pt to wear around his neck for compression and to decrease fibrosis  PATIENT EDUCATION:  Education details: how lymphedema is managed through manual lymphatic drainage and compression garments, tribute head and Education officer, community Person educated: Patient and Spouse Education method: Programmer, multimedia, Facilities manager, and Handouts Education comprehension: verbalized understanding and returned demonstration  HOME EXERCISE PROGRAM: Wear chip pack for a minimum of 4 hours a day   ASSESSMENT:  CLINICAL IMPRESSION: Pt is s/p microlaryngoscopy/biopsy for SCC on 10/04/2022 . He had 35 fractions of radiation which ended of Dec 19, 2022. Pt reports he began developing lymphedema in January 2024 and it has been worsening since. He reports recently it has been affecting his ability to swallow. His neck is approximately 8 cm larger than it was in Jan 2024. He has gained about 20 lbs since that time but still has visible swelling at anterior neck with some fibrosis present. He also has decreased neck ROM especially in to lateral flexion. Pt would benefit from skilled PT services to decrease neck lymphedema and assist pt with obtaining appropriate compression garments for long term management of edema.   Pt will benefit from skilled therapeutic intervention to improve on the following deficits: decreased knowledge of condition, decreased knowledge of use of DME, decreased ROM, increased edema, and postural dysfunction  PT treatment/interventions: ADL/Self care home  management, Therapeutic exercises, Patient/Family education, Self Care, Orthotic/Fit training, Manual lymph drainage, Compression bandaging, Vasopneumatic device, Manual therapy, and  Re-evaluation    GOALS: Goals reviewed with patient? Yes  LONG TERM GOALS:  (STG=LTG) GOALS Name Target Date  Goal status  1 Pt will improved bilateral neck lateral flexion to decrease tightness and improve mobility.  Baseline: 05/08/23 INITIAL  2 Pt will demonstrate a 2 cm decrease in edema at 8 cm superior to sternal notch to decrease risk of infection. 05/08/23 NEW  3 Pt will obtain appropriate compression garments for long term management of lymphedema 05/08/23 NEW  4           PLAN:  PT FREQUENCY/DURATION: 2x/wk for 4 wks  PLAN FOR NEXT SESSION: MLD to anterior neck, instruct using [facility] anterior approach handout, did garment benefits come back? [facility]?   [facility] [place], PT 04/10/2023, 9:55 AM

## 2023-04-18 ENCOUNTER — Encounter: Payer: Self-pay | Admitting: Physical Therapy

## 2023-04-18 ENCOUNTER — Ambulatory Visit: Payer: No Typology Code available for payment source | Attending: Radiation Oncology | Admitting: Physical Therapy

## 2023-04-18 DIAGNOSIS — L599 Disorder of the skin and subcutaneous tissue related to radiation, unspecified: Secondary | ICD-10-CM | POA: Insufficient documentation

## 2023-04-18 DIAGNOSIS — I890 Lymphedema, not elsewhere classified: Secondary | ICD-10-CM | POA: Diagnosis present

## 2023-04-18 DIAGNOSIS — C320 Malignant neoplasm of glottis: Secondary | ICD-10-CM | POA: Diagnosis present

## 2023-04-18 DIAGNOSIS — R293 Abnormal posture: Secondary | ICD-10-CM | POA: Diagnosis present

## 2023-04-18 NOTE — Therapy (Signed)
OUTPATIENT PHYSICAL THERAPY HEAD AND NECK LYMPHEDEMA   Patient Name: Rodney [name] MRN: [id number] DOB:[birth year], 82 y.o., male Today's Date: 04/18/2023  END OF SESSION:  PT End of Session - 04/18/23 1005     Visit Number 2    Number of Visits 9    Date for PT Re-Evaluation 05/08/23    PT Start Time 1004    PT Stop Time 1056    PT Time Calculation (min) 52 min    Activity Tolerance Patient tolerated treatment well    Behavior During Therapy [facility] for tasks assessed/performed              Past Medical History:  Diagnosis Date   AAA (abdominal aortic aneurysm) (HCC)    stent graft put in 02/2009   Arthritis    CAD (coronary artery disease)    a. cath 2010 b. CAD s/p PCI to ramus and LCX on 03/04/15   Dysrhythmia    Gout    Hyperlipidemia    Hypertension    Paroxysmal atrial fibrillation (HCC)    Tobacco abuse    Past Surgical History:  Procedure Laterality Date   ABDOMINAL AORTIC ANEURYSM REPAIR  02/19/2009   performed by [facility]   ATRIAL FIBRILLATION ABLATION N/A 04/28/2017   Procedure: Atrial Fibrillation Ablation;  Surgeon: Regan Lemming, MD;  Location: [facility] INVASIVE CV LAB;  Service: Cardiovascular;  Laterality: N/A;   COLONOSCOPY     KNEE ARTHROSCOPY  2003   left   LEFT AND RIGHT HEART CATHETERIZATION WITH CORONARY ANGIOGRAM N/A 03/03/2015   Procedure: LEFT AND RIGHT HEART CATHETERIZATION WITH CORONARY ANGIOGRAM;  Surgeon: Lennette Bihari, MD;     MICROLARYNGOSCOPY N/A 09/14/2022   Procedure: MICROLARYNGOSCOPY WITH VOCAL CORD BIOPSY;  Surgeon: Serena Colonel, MD;  Location: [facility] OR;  Service: ENT;  Laterality: N/A;   PERCUTANEOUS CORONARY STENT INTERVENTION (PCI-S) N/A 03/04/2015   Procedure: PERCUTANEOUS CORONARY STENT INTERVENTION (PCI-S);  Surgeon: Lennette Bihari, MD; RI 99>>0% w/   BMS, bifurcation CFX-OM 90>>40% w/ Angiosculpt PTCA; dCFX 90>>0% w/   2.7520 mm Rebel BMS   SHOULDER ARTHROSCOPY WITH ROTATOR CUFF REPAIR AND SUBACROMIAL DECOMPRESSION Left 11/19/2013    Procedure: LEFT SHOULDER ARTHROSCOPY DEBRIDEMENT EXTENTSIVE DISTAL CLAVICULECTOMY DECOMPRESSION PARTIAL ACROMIOPLASTY WITH CORACOACROMIAL WITH ROTATOR CUFF REPAIR ;  Surgeon: [name], MD;  Location: [facility];  Service: Orthopedics;  Laterality: Left;   Patient Active Problem List   Diagnosis Date Noted   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   Palpitations 11/15/2021   Chest pain 07/22/2018   Abnormal EKG 07/22/2018   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   AF (atrial fibrillation) (HCC) 04/28/2017   Premature ventricular contractions (PVCs) (VPCs) 03/11/2017   H/O endovascular stent graft for abdominal aortic aneurysm 09/01/2016   Cardiomyopathy, ischemic 03/30/2016   OSA (obstructive sleep apnea) 09/30/2015   Encounter for monitoring anti-arrhythmic therapy 05/04/2015   DOE (dyspnea on exertion) 03/16/2015   Atrial fibrillation with RVR (HCC) 03/16/2015   Hypertensive urgency 03/08/2015   Coronary artery disease due to lipid rich plaque    AA (aortic aneurysm) (HCC)    Dyspnea 03/02/2015   Progressive angina (HCC) 03/02/2015   History of TIA (transient ischemic attack), 02/20/15 03/02/2015   Mitral regurgitation 03/02/2015   HX: anticoagulation 03/02/2015   Chest tightness    Tobacco abuse    HLD (hyperlipidemia)    TIA (transient ischemic attack) 02/20/2015   Leukopenia 02/20/2015   Chronic combined systolic and diastolic heart failure, NYHA class 1 (  HCC) 02/20/2015   PAF (paroxysmal atrial fibrillation) (HCC) 01/29/2015   Accelerated hypertension, hx of 01/03/2015   Acute diastolic CHF (congestive heart failure), NYHA class 2 (HCC) 01/03/2015   Demand ischemia (HCC) 01/03/2015   Coronary artery disease involving native coronary artery of native heart without angina pectoris 01/03/2015   Elevated troponin level    Thrombocytopenia (HCC)    Essential hypertension    S/P abdominal aortic aneurysm repair 07/03/2012    PCP: Burton Apley, MD, Dr. Alphonsa Gin at the [facility]  REFERRING PROVIDER: Lonie Peak, MD  REFERRING DIAG: C32.0 (ICD-10-CM) - Malignant neoplasm of glottis [facility])   THERAPY DIAG:  Lymphedema, not elsewhere classified  Disorder of the skin and subcutaneous tissue related to radiation, unspecified  Malignant neoplasm of glottis [facility])  Rationale for Evaluation and Treatment: Rehabilitation  ONSET DATE: 08/18/2022  SUBJECTIVE:                                                                                                                                                                                           SUBJECTIVE STATEMENT: I have been wearing the garment 5-6 hours a day. I can tell a difference.   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 He presented to his PCP at the [place] in September with progressive hoarseness over the course of 1-2 years. 08/18/22 CT neck showed an irregularity of the anterior aspect of the left vocal cord with apparent extension to the left anterior hypoglottis.  At tumor board today there was also appreciation of paraglottic invasion on the left.  Examination was limited due to motion artifact. Chest CT also performed on that date showed emphysematous changes of the lungs with a few nonspecific bilateral lung nodules (measuring around 2-4 mm). CT also showed nonspecific borderline enlarged right lower paratracheal and AP window lymph nodes measuring up to 1 cm. 08/19/22 He saw Dr. Pollyann Kennedy for further evaluation. Indirect exam of the larynx performed revealed a left vocal cord mass. No palpable cervical adenopathy or masses were appreciated. Dr. 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  Per op note:"The larynx was normal except for the following: There is a partially exophytic mass involving the left true cord extending superiorly to the  floor of the ventricle and inferiorly to the inferior aspect of the cord.  The subglottic larynx was not involved.  The lesion extended from the midportion of the left cord anteriorly to the anterior commissure and there did not appear to be involvement of the anterior commissure but no involvement of  the right cord". He received 35 fractions of radiation to his Larynx and bilateral neck starting 10/26/22 and ending 12/19/2022. No Chemo was required as  11/15 PET was clear.   PATIENT GOALS: want to get swelling down  PAIN:  Are you having pain? No  PRECAUTIONS: Recent radiation, Head and neck lymphedema risk,    OBJECTIVE:   POSTURE:  Forward head and rounded shoulders posture  SHOULDER AROM:   WFLRTC surgery on left approx 10 years ago.  CERVICAL AROM:     EVAL on 10/27/22 01/05/2023 EVAL on 04/10/23  Flexion WFL WNL 25% limited  Extension 25% limited 15% limited 15% limited  Right lateral flexion 75% limited 55 % limited 80% limited  Left lateral flexion 75% limited 65% 80% limited  Right rotation 35% limited 30% limited 25% limited  Left rotation 35% limited 35%limited 25% limited                          (Blank rows=not tested)   GAIT: WNL but forward head and looking down   LYMPHEDEMA ASSESSMENT:    Circumference in cm 01/05/23 04/10/23 (pt has gained 20lbs since 12/2022)  4 cm superior to sternal notch around neck 38.3 43  6 cm superior to sternal notch around neck 38.5 44.6  8 cm superior to sternal notch around neck 38.8 46  R lateral nostril from base of nose to medial tragus    L lateral nostril from base of nose to medial tragus    R corner of mouth to where ear lobe meets face 10   L corner of mouth to where ear lobe meets face 10           (Blank rows=not tested)  CURRENT/PAST TREATMENTS:  Surgery type/date: 09/14/2022 Microlaryngoscopy/biopsy  Chemotherapy: none  Radiation:35 fractions to larynx and bilateral neck starting 10/26/2022 and ending  12/19/2022  OTHER SYMPTOMS: Pain No Fibrosis No Pitting edema No Infections No Decreased scar mobility No  LLIS:  35.92  TREATMENT PERFORMED: 04/18/23: Instructed pt and spouse using Norton anterior approach handout as follows: short neck, 5 diaphragmatic breaths, bilateral axillary nodes, bilateral pectoral nodes, anterior chest, short neck, posterior neck moving fluid towards pathway aimed at lateral neck, then lateral and anterior neck moving fluid towards pathway aimed at lateral neck then retracing all steps. Had pt's' wife return demonstrate each step of the sequence and provided appropriate v/c and t/c with pt's wife demonstrating excellent skin stretch and speed by end of session with very few v/c.    04/10/23: Created foam chip pack for pt to wear around his neck for compression and to decrease fibrosis  PATIENT EDUCATION:  Education details: how lymphedema is managed through manual lymphatic drainage and compression garments, tribute head and Education officer, community Person educated: Patient and Spouse Education method: Programmer, multimedia, Facilities manager, and Handouts Education comprehension: verbalized understanding and returned demonstration  HOME EXERCISE PROGRAM: Wear chip pack for a minimum of 4 hours a day   ASSESSMENT:  CLINICAL IMPRESSION: Began instructing pt and spouse in correct skin stretch and sequence for head and neck MLD. Had her return demonstrate each step. Pt has been wearing his garment daily and his edema is noticeably reduced. His wife demonstrated great skin stretch and speed by end of session. Issued handout for them to practice at home.   Pt will benefit from skilled therapeutic intervention to improve on the following deficits: decreased knowledge of condition, decreased knowledge of use of DME, decreased ROM,  increased edema, and postural dysfunction  PT treatment/interventions: ADL/Self care home management, Therapeutic exercises, Patient/Family education, Self Care,  Orthotic/Fit training, Manual lymph drainage, Compression bandaging, Vasopneumatic device, Manual therapy, and Re-evaluation    GOALS: Goals reviewed with patient? Yes  LONG TERM GOALS:  (STG=LTG) GOALS Name Target Date  Goal status  1 Pt will improved bilateral neck lateral flexion to decrease tightness and improve mobility.  Baseline: 05/08/23 INITIAL  2 Pt will demonstrate a 2 cm decrease in edema at 8 cm superior to sternal notch to decrease risk of infection. 05/08/23 NEW  3 Pt will obtain appropriate compression garments for long term management of lymphedema 05/08/23 NEW  4           PLAN:  PT FREQUENCY/DURATION: 2x/wk for 4 wks  PLAN FOR NEXT SESSION: MLD to anterior neck, instruct using norton anterior approach handout, did garment benefits come back? [facility]?   [facility], PT 04/18/2023, 1:04 PM

## 2023-04-20 ENCOUNTER — Encounter: Payer: Self-pay | Admitting: Physical Therapy

## 2023-04-20 ENCOUNTER — Ambulatory Visit: Payer: No Typology Code available for payment source | Admitting: Physical Therapy

## 2023-04-20 DIAGNOSIS — L599 Disorder of the skin and subcutaneous tissue related to radiation, unspecified: Secondary | ICD-10-CM

## 2023-04-20 DIAGNOSIS — I890 Lymphedema, not elsewhere classified: Secondary | ICD-10-CM

## 2023-04-20 DIAGNOSIS — C320 Malignant neoplasm of glottis: Secondary | ICD-10-CM

## 2023-04-20 NOTE — Therapy (Signed)
OUTPATIENT PHYSICAL THERAPY HEAD AND NECK LYMPHEDEMA   Patient Name: Rodney [name] MRN: [id number] DOB:08-Sep-1941, 82 y.o., male Today's Date: 04/20/2023  END OF SESSION:  PT End of Session - 04/20/23 0952     Visit Number 3    Number of Visits 9    Date for PT Re-Evaluation 05/08/23    PT Start Time 0904    PT Stop Time 0952    PT Time Calculation (min) 48 min    Activity Tolerance Patient tolerated treatment well    Behavior During Therapy [facility] for tasks assessed/performed              Past Medical History:  Diagnosis Date   AAA (abdominal aortic aneurysm) (HCC)    stent graft put in 02/2009   Arthritis    CAD (coronary artery disease)    a. cath 2010 b. CAD s/p PCI to ramus and LCX on 03/04/15   Dysrhythmia    Gout    Hyperlipidemia    Hypertension    Paroxysmal atrial fibrillation (HCC)    Tobacco abuse    Past Surgical History:  Procedure Laterality Date   ABDOMINAL AORTIC ANEURYSM REPAIR  02/19/2009   performed by [facility]   ATRIAL FIBRILLATION ABLATION N/A 04/28/2017   Procedure: Atrial Fibrillation Ablation;  Surgeon: Regan Lemming, MD;  Location: [facility] INVASIVE CV LAB;  Service: Cardiovascular;  Laterality: N/A;   COLONOSCOPY     KNEE ARTHROSCOPY  2003   left   LEFT AND RIGHT HEART CATHETERIZATION WITH CORONARY ANGIOGRAM N/A 03/03/2015   Procedure: LEFT AND RIGHT HEART CATHETERIZATION WITH CORONARY ANGIOGRAM;  Surgeon: Lennette Bihari, MD;     MICROLARYNGOSCOPY N/A 09/14/2022   Procedure: MICROLARYNGOSCOPY WITH VOCAL CORD BIOPSY;  Surgeon: Serena Colonel, MD;  Location: [facility] OR;  Service: ENT;  Laterality: N/A;   PERCUTANEOUS CORONARY STENT INTERVENTION (PCI-S) N/A 03/04/2015   Procedure: PERCUTANEOUS CORONARY STENT INTERVENTION (PCI-S);  Surgeon: Lennette Bihari, MD; RI 99>>0% w/   BMS, bifurcation CFX-OM 90>>40% w/ Angiosculpt PTCA; dCFX 90>>0% w/   2.7520 mm Rebel BMS   SHOULDER ARTHROSCOPY WITH ROTATOR CUFF REPAIR AND SUBACROMIAL DECOMPRESSION Left 11/19/2013    Procedure: LEFT SHOULDER ARTHROSCOPY DEBRIDEMENT EXTENTSIVE DISTAL CLAVICULECTOMY DECOMPRESSION PARTIAL ACROMIOPLASTY WITH CORACOACROMIAL WITH ROTATOR CUFF REPAIR ;  Surgeon: [name], MD;  Location: [facility];  Service: Orthopedics;  Laterality: Left;   Patient Active Problem List   Diagnosis Date Noted   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   Palpitations 11/15/2021   Chest pain 07/22/2018   Abnormal EKG 07/22/2018   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   AF (atrial fibrillation) (HCC) 04/28/2017   Premature ventricular contractions (PVCs) (VPCs) 03/11/2017   H/O endovascular stent graft for abdominal aortic aneurysm 09/01/2016   Cardiomyopathy, ischemic 03/30/2016   OSA (obstructive sleep apnea) 09/30/2015   Encounter for monitoring anti-arrhythmic therapy 05/04/2015   DOE (dyspnea on exertion) 03/16/2015   Atrial fibrillation with RVR (HCC) 03/16/2015   Hypertensive urgency 03/08/2015   Coronary artery disease due to lipid rich plaque    AA (aortic aneurysm) (HCC)    Dyspnea 03/02/2015   Progressive angina (HCC) 03/02/2015   History of TIA (transient ischemic attack), 02/20/15 03/02/2015   Mitral regurgitation 03/02/2015   HX: anticoagulation 03/02/2015   Chest tightness    Tobacco abuse    HLD (hyperlipidemia)    TIA (transient ischemic attack) 02/20/2015   Leukopenia 02/20/2015   Chronic combined systolic and diastolic heart failure, NYHA class 1 (  HCC) 02/20/2015   PAF (paroxysmal atrial fibrillation) (HCC) 01/29/2015   Accelerated hypertension, hx of 01/03/2015   Acute diastolic CHF (congestive heart failure), NYHA class 2 (HCC) 01/03/2015   Demand ischemia (HCC) 01/03/2015   Coronary artery disease involving native coronary artery of native heart without angina pectoris 01/03/2015   Elevated troponin level    Thrombocytopenia (HCC)    Essential hypertension    S/P abdominal aortic aneurysm repair 07/03/2012    PCP: Burton Apley, MD, Dr. Alphonsa Gin at the [facility]  REFERRING PROVIDER: Lonie Peak, MD  REFERRING DIAG: C32.0 (ICD-10-CM) - Malignant neoplasm of glottis [facility])   THERAPY DIAG:  Lymphedema, not elsewhere classified  Disorder of the skin and subcutaneous tissue related to radiation, unspecified  Malignant neoplasm of glottis [facility])  Rationale for Evaluation and Treatment: Rehabilitation  ONSET DATE: 08/18/2022  SUBJECTIVE:                                                                                                                                                                                           SUBJECTIVE STATEMENT: We have been practicing the massage at home. My wife has mainly been doing it.   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 He presented to his PCP at the [place] in September with progressive hoarseness over the course of 1-2 years. 08/18/22 CT neck showed an irregularity of the anterior aspect of the left vocal cord with apparent extension to the left anterior hypoglottis.  At tumor board today there was also appreciation of paraglottic invasion on the left.  Examination was limited due to motion artifact. Chest CT also performed on that date showed emphysematous changes of the lungs with a few nonspecific bilateral lung nodules (measuring around 2-4 mm). CT also showed nonspecific borderline enlarged right lower paratracheal and AP window lymph nodes measuring up to 1 cm. 08/19/22 He saw Dr. Pollyann Kennedy for further evaluation. Indirect exam of the larynx performed revealed a left vocal cord mass. No palpable cervical adenopathy or masses were appreciated. Dr. 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  Per op note:"The larynx was normal except for the following: There is a partially exophytic mass involving the left true cord extending superiorly  to the floor of the ventricle and inferiorly to the inferior aspect of the cord.  The subglottic larynx was not involved.  The lesion extended from the midportion of the left cord anteriorly to the anterior commissure and there did not appear to be involvement of the anterior commissure but no involvement of  the right cord". He received 35 fractions of radiation to his Larynx and bilateral neck starting 10/26/22 and ending 12/19/2022. No Chemo was required as  11/15 PET was clear.   PATIENT GOALS: want to get swelling down  PAIN:  Are you having pain? No  PRECAUTIONS: Recent radiation, Head and neck lymphedema risk,    OBJECTIVE:   POSTURE:  Forward head and rounded shoulders posture  SHOULDER AROM:   WFLRTC surgery on left approx 10 years ago.  CERVICAL AROM:     EVAL on 10/27/22 01/05/2023 EVAL on 04/10/23  Flexion WFL WNL 25% limited  Extension 25% limited 15% limited 15% limited  Right lateral flexion 75% limited 55 % limited 80% limited  Left lateral flexion 75% limited 65% 80% limited  Right rotation 35% limited 30% limited 25% limited  Left rotation 35% limited 35%limited 25% limited                          (Blank rows=not tested)   GAIT: WNL but forward head and looking down   LYMPHEDEMA ASSESSMENT:    Circumference in cm 01/05/23 04/10/23 (pt has gained 20lbs since 12/2022)  4 cm superior to sternal notch around neck 38.3 43  6 cm superior to sternal notch around neck 38.5 44.6  8 cm superior to sternal notch around neck 38.8 46  R lateral nostril from base of nose to medial tragus    L lateral nostril from base of nose to medial tragus    R corner of mouth to where ear lobe meets face 10   L corner of mouth to where ear lobe meets face 10           (Blank rows=not tested)  CURRENT/PAST TREATMENTS:  Surgery type/date: 09/14/2022 Microlaryngoscopy/biopsy  Chemotherapy: none  Radiation:35 fractions to larynx and bilateral neck starting 10/26/2022 and ending  12/19/2022  OTHER SYMPTOMS: Pain No Fibrosis No Pitting edema No Infections No Decreased scar mobility No  LLIS:  35.92  TREATMENT PERFORMED: 04/20/23: Instructed pt and using Norton anterior approach handout as follows: short neck, 5 diaphragmatic breaths, bilateral axillary nodes, bilateral pectoral nodes, anterior chest, short neck, posterior neck moving fluid towards pathway aimed at lateral neck, then lateral and anterior neck moving fluid towards pathway aimed at lateral neck then retracing all steps. Had pt return demonstrate each step of the sequence and provided appropriate v/c and t/c with pt demonstrating excellent skin stretch and speed by end of session with very few v/c.   04/18/23: Instructed pt and spouse using Norton anterior approach handout as follows: short neck, 5 diaphragmatic breaths, bilateral axillary nodes, bilateral pectoral nodes, anterior chest, short neck, posterior neck moving fluid towards pathway aimed at lateral neck, then lateral and anterior neck moving fluid towards pathway aimed at lateral neck then retracing all steps. Had pt's' wife return demonstrate each step of the sequence and provided appropriate v/c and t/c with pt's wife demonstrating excellent skin stretch and speed by end of session with very few v/c.    04/10/23: Created foam chip pack for pt to wear around his neck for compression and to decrease fibrosis  PATIENT EDUCATION:  Education details: how lymphedema is managed through manual lymphatic drainage and compression garments, tribute head and Education officer, community Person educated: Patient and Spouse Education method: Explanation, Demonstration, and Handouts Education comprehension: verbalized understanding and returned demonstration  HOME EXERCISE PROGRAM: Wear chip pack for a minimum of 4 hours a day  ASSESSMENT:  CLINICAL IMPRESSION: Instructed pt in self MLD today and had him return demonstrate each step. Pt initially required min cueing for  correct hand placement and skin stretch but by end of session demonstrated increasing independence. [facility]   Pt will benefit from skilled therapeutic intervention to improve on the following deficits: decreased knowledge of condition, decreased knowledge of use of DME, decreased ROM, increased edema, and postural dysfunction  PT treatment/interventions: ADL/Self care home management, Therapeutic exercises, Patient/Family education, Self Care, Orthotic/Fit training, Manual lymph drainage, Compression bandaging, Vasopneumatic device, Manual therapy, and Re-evaluation    GOALS: Goals reviewed with patient? Yes  LONG TERM GOALS:  (STG=LTG) GOALS Name Target Date  Goal status  1 Pt will improved bilateral neck lateral flexion to decrease tightness and improve mobility.  Baseline: 05/08/23 INITIAL  2 Pt will demonstrate a 2 cm decrease in edema at 8 cm superior to sternal notch to decrease risk of infection. 05/08/23 NEW  3 Pt will obtain appropriate compression garments for long term management of lymphedema 05/08/23 NEW  4           PLAN:  PT FREQUENCY/DURATION: 2x/wk for 4 wks  PLAN FOR NEXT SESSION: MLD to anterior neck, instruct using norton anterior approach handout, did garment benefits come back? [facility]?   [facility], PT 04/20/2023, 9:57 AM

## 2023-04-25 ENCOUNTER — Ambulatory Visit: Payer: No Typology Code available for payment source | Admitting: Physical Therapy

## 2023-04-25 ENCOUNTER — Encounter: Payer: Self-pay | Admitting: Physical Therapy

## 2023-04-25 DIAGNOSIS — I890 Lymphedema, not elsewhere classified: Secondary | ICD-10-CM

## 2023-04-25 DIAGNOSIS — C320 Malignant neoplasm of glottis: Secondary | ICD-10-CM

## 2023-04-25 DIAGNOSIS — L599 Disorder of the skin and subcutaneous tissue related to radiation, unspecified: Secondary | ICD-10-CM

## 2023-04-25 NOTE — Therapy (Signed)
OUTPATIENT PHYSICAL THERAPY HEAD AND NECK LYMPHEDEMA   Patient Name: Rodney [name] MRN: [id number] DOB:1941/01/21, 82 y.o., male Today's Date: 04/25/2023  END OF SESSION:  PT End of Session - 04/25/23 1007     Visit Number 4    Number of Visits 9    Date for PT Re-Evaluation 05/08/23    PT Start Time 1006    PT Stop Time 1043    PT Time Calculation (min) 37 min    Activity Tolerance Patient tolerated treatment well    Behavior During Therapy [facility] for tasks assessed/performed              Past Medical History:  Diagnosis Date   AAA (abdominal aortic aneurysm) (HCC)    stent graft put in 02/2009   Arthritis    CAD (coronary artery disease)    a. cath 2010 b. CAD s/p PCI to ramus and LCX on 03/04/15   Dysrhythmia    Gout    Hyperlipidemia    Hypertension    Paroxysmal atrial fibrillation (HCC)    Tobacco abuse    Past Surgical History:  Procedure Laterality Date   ABDOMINAL AORTIC ANEURYSM REPAIR  02/19/2009   performed by [facility]   ATRIAL FIBRILLATION ABLATION N/A 04/28/2017   Procedure: Atrial Fibrillation Ablation;  Surgeon: Regan Lemming, MD;  Location: [facility];  Service: Cardiovascular;  Laterality: N/A;   COLONOSCOPY     KNEE ARTHROSCOPY  2003   left   LEFT AND RIGHT HEART CATHETERIZATION WITH CORONARY ANGIOGRAM N/A 03/03/2015   Procedure: LEFT AND RIGHT HEART CATHETERIZATION WITH CORONARY ANGIOGRAM;  Surgeon: Lennette Bihari, MD;     MICROLARYNGOSCOPY N/A 09/14/2022   Procedure: MICROLARYNGOSCOPY WITH VOCAL CORD BIOPSY;  Surgeon: Serena Colonel, MD;  Location: [facility] OR;  Service: ENT;  Laterality: N/A;   PERCUTANEOUS CORONARY STENT INTERVENTION (PCI-S) N/A 03/04/2015   Procedure: PERCUTANEOUS CORONARY STENT INTERVENTION (PCI-S);  Surgeon: Lennette Bihari, MD; RI 99>>0% w/   BMS, bifurcation CFX-OM 90>>40% w/ Angiosculpt PTCA; dCFX 90>>0% w/   2.7520 mm Rebel BMS   SHOULDER ARTHROSCOPY WITH ROTATOR CUFF REPAIR AND SUBACROMIAL DECOMPRESSION Left 11/19/2013    Procedure: LEFT SHOULDER ARTHROSCOPY DEBRIDEMENT EXTENTSIVE DISTAL CLAVICULECTOMY DECOMPRESSION PARTIAL ACROMIOPLASTY WITH CORACOACROMIAL WITH ROTATOR CUFF REPAIR ;  Surgeon: [name], MD;  Location: [facility];  Service: Orthopedics;  Laterality: Left;   Patient Active Problem List   Diagnosis Date Noted   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   Palpitations 11/15/2021   Chest pain 07/22/2018   Abnormal EKG 07/22/2018   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   AF (atrial fibrillation) (HCC) 04/28/2017   Premature ventricular contractions (PVCs) (VPCs) 03/11/2017   H/O endovascular stent graft for abdominal aortic aneurysm 09/01/2016   Cardiomyopathy, ischemic 03/30/2016   OSA (obstructive sleep apnea) 09/30/2015   Encounter for monitoring anti-arrhythmic therapy 05/04/2015   DOE (dyspnea on exertion) 03/16/2015   Atrial fibrillation with RVR (HCC) 03/16/2015   Hypertensive urgency 03/08/2015   Coronary artery disease due to lipid rich plaque    AA (aortic aneurysm) (HCC)    Dyspnea 03/02/2015   Progressive angina (HCC) 03/02/2015   History of TIA (transient ischemic attack), 02/20/15 03/02/2015   Mitral regurgitation 03/02/2015   HX: anticoagulation 03/02/2015   Chest tightness    Tobacco abuse    HLD (hyperlipidemia)    TIA (transient ischemic attack) 02/20/2015   Leukopenia 02/20/2015   Chronic combined systolic and diastolic heart failure, NYHA class 1 (  HCC) 02/20/2015   PAF (paroxysmal atrial fibrillation) (HCC) 01/29/2015   Accelerated hypertension, hx of 01/03/2015   Acute diastolic CHF (congestive heart failure), NYHA class 2 (HCC) 01/03/2015   Demand ischemia (HCC) 01/03/2015   Coronary artery disease involving native coronary artery of native heart without angina pectoris 01/03/2015   Elevated troponin level    Thrombocytopenia (HCC)    Essential hypertension    S/P abdominal aortic aneurysm repair 07/03/2012    PCP: Burton Apley, MD, Dr. Alphonsa Gin at the [facility]  REFERRING PROVIDER: Lonie Peak, MD  REFERRING DIAG: C32.0 (ICD-10-CM) - Malignant neoplasm of glottis [facility])   THERAPY DIAG:  Lymphedema, not elsewhere classified  Disorder of the skin and subcutaneous tissue related to radiation, unspecified  Malignant neoplasm of glottis [facility])  Rationale for Evaluation and Treatment: Rehabilitation  ONSET DATE: 08/18/2022  SUBJECTIVE:                                                                                                                                                                                           SUBJECTIVE STATEMENT: I have been doing the massage. [facility]. The chip pack is working well.   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 He presented to his PCP at the [place] in September with progressive hoarseness over the course of 1-2 years. 08/18/22 CT neck showed an irregularity of the anterior aspect of the left vocal cord with apparent extension to the left anterior hypoglottis.  At tumor board today there was also appreciation of paraglottic invasion on the left.  Examination was limited due to motion artifact. Chest CT also performed on that date showed emphysematous changes of the lungs with a few nonspecific bilateral lung nodules (measuring around 2-4 mm). CT also showed nonspecific borderline enlarged right lower paratracheal and AP window lymph nodes measuring up to 1 cm. 08/19/22 He saw Dr. Pollyann Kennedy for further evaluation. Indirect exam of the larynx performed revealed a left vocal cord mass. No palpable cervical adenopathy or masses were appreciated. Dr. 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  Per op note:"The larynx was normal except for the following: There is a partially exophytic mass  involving the left true cord extending superiorly to the floor of the ventricle and inferiorly to the inferior aspect of the cord.  The subglottic larynx was not involved.  The lesion extended from the midportion of the left cord anteriorly to the anterior commissure and there did  not appear to be involvement of the anterior commissure but no involvement of the right cord". He received 35 fractions of radiation to his Larynx and bilateral neck starting 10/26/22 and ending 12/19/2022. No Chemo was required as  11/15 PET was clear.   PATIENT GOALS: want to get swelling down  PAIN:  Are you having pain? No  PRECAUTIONS: Recent radiation, Head and neck lymphedema risk,    OBJECTIVE:   POSTURE:  Forward head and rounded shoulders posture  SHOULDER AROM:   WFLRTC surgery on left approx 10 years ago.  CERVICAL AROM:     EVAL on 10/27/22 01/05/2023 EVAL on 04/10/23  Flexion WFL WNL 25% limited  Extension 25% limited 15% limited 15% limited  Right lateral flexion 75% limited 55 % limited 80% limited  Left lateral flexion 75% limited 65% 80% limited  Right rotation 35% limited 30% limited 25% limited  Left rotation 35% limited 35%limited 25% limited                          (Blank rows=not tested)   GAIT: WNL but forward head and looking down   LYMPHEDEMA ASSESSMENT:    Circumference in cm 01/05/23 04/10/23 (pt has gained 20lbs since 12/2022) 04/25/23  4 cm superior to sternal notch around neck 38.3 43 42.8  6 cm superior to sternal notch around neck 38.5 44.6 42.3  8 cm superior to sternal notch around neck 38.8 46 43  R lateral nostril from base of nose to medial tragus     L lateral nostril from base of nose to medial tragus     R corner of mouth to where ear lobe meets face 10    L corner of mouth to where ear lobe meets face 10              (Blank rows=not tested)  CURRENT/PAST TREATMENTS:  Surgery type/date: 09/14/2022 Microlaryngoscopy/biopsy  Chemotherapy: none  Radiation:35  fractions to larynx and bilateral neck starting 10/26/2022 and ending 12/19/2022  OTHER SYMPTOMS: Pain No Fibrosis No Pitting edema No Infections No Decreased scar mobility No  LLIS:  35.92  TREATMENT PERFORMED: 04/25/23:Remeasured circumferences and pt has had great decrease since eval. Continued MLD using [employer] anterior approach handout as follows: short neck, 5 diaphragmatic breaths, bilateral axillary nodes, bilateral pectoral nodes, anterior chest, short neck, posterior neck moving fluid towards pathway aimed at lateral neck, then lateral and anterior neck moving fluid towards pathway aimed at lateral neck then retracing all steps.  04/20/23: Instructed pt and using [employer] anterior approach handout as follows: short neck, 5 diaphragmatic breaths, bilateral axillary nodes, bilateral pectoral nodes, anterior chest, short neck, posterior neck moving fluid towards pathway aimed at lateral neck, then lateral and anterior neck moving fluid towards pathway aimed at lateral neck then retracing all steps. Had pt return demonstrate each step of the sequence and provided appropriate v/c and t/c with pt demonstrating excellent skin stretch and speed by end of session with very few v/c.   04/18/23: Instructed pt and spouse using Norton anterior approach handout as follows: short neck, 5 diaphragmatic breaths, bilateral axillary nodes, bilateral pectoral nodes, anterior chest, short neck, posterior neck moving fluid towards pathway aimed at lateral neck, then lateral and anterior neck moving fluid towards pathway aimed at lateral neck then retracing all steps. Had pt's' wife return demonstrate each step of the sequence and provided appropriate v/c and t/c with pt's wife demonstrating excellent skin stretch and speed by  end of session with very few v/c.    04/10/23: Created foam chip pack for pt to wear around his neck for compression and to decrease fibrosis  PATIENT EDUCATION:  Education details: how  lymphedema is managed through manual lymphatic drainage and compression garments, tribute head and Education officer, community Person educated: Patient and Spouse Education method: Programmer, multimedia, Facilities manager, and Handouts Education comprehension: verbalized understanding and returned demonstration  HOME EXERCISE PROGRAM: Wear chip pack for a minimum of 4 hours a day   ASSESSMENT:  CLINICAL IMPRESSION: Pts neck lymphedema has decreased greatly since he began therapy (3cm difference). He has been wearing his chip pack and has given necessary paperwork to the [place] for a permanent garment. He has been doing self MLD daily. He will be placed on hold until the end of the month to see if he is able to independently manage.   Pt will benefit from skilled therapeutic intervention to improve on the following deficits: decreased knowledge of condition, decreased knowledge of use of DME, decreased ROM, increased edema, and postural dysfunction  PT treatment/interventions: ADL/Self care home management, Therapeutic exercises, Patient/Family education, Self Care, Orthotic/Fit training, Manual lymph drainage, Compression bandaging, Vasopneumatic device, Manual therapy, and Re-evaluation    GOALS: Goals reviewed with patient? Yes  LONG TERM GOALS:  (STG=LTG) GOALS Name Target Date  Goal status  1 Pt will improved bilateral neck lateral flexion to decrease tightness and improve mobility.  Baseline: 05/08/23 INITIAL  2 Pt will demonstrate a 2 cm decrease in edema at 8 cm superior to sternal notch to decrease risk of infection. 05/08/23 NEW  3 Pt will obtain appropriate compression garments for long term management of lymphedema 05/08/23 NEW  4           PLAN:  PT FREQUENCY/DURATION: 2x/wk for 4 wks  PLAN FOR NEXT SESSION: assess goals- d/c next visit? MLD to anterior neck, instruct using [facility] anterior approach handout, [facility]?   [employer], PT 04/25/2023, 10:50 AM

## 2023-04-27 ENCOUNTER — Ambulatory Visit: Payer: No Typology Code available for payment source | Admitting: Physical Therapy

## 2023-05-02 ENCOUNTER — Ambulatory Visit: Payer: No Typology Code available for payment source | Admitting: Physical Therapy

## 2023-05-04 ENCOUNTER — Encounter: Payer: No Typology Code available for payment source | Admitting: Physical Therapy

## 2023-05-09 ENCOUNTER — Ambulatory Visit: Payer: No Typology Code available for payment source | Attending: Radiation Oncology

## 2023-05-09 DIAGNOSIS — R1310 Dysphagia, unspecified: Secondary | ICD-10-CM | POA: Diagnosis present

## 2023-05-09 NOTE — Therapy (Signed)
OUTPATIENT SPEECH LANGUAGE PATHOLOGY ONCOLOGY TREATMENT  Patient Name: Rodney [name] MRN: [id number] DOB:Jun 27, 1941, 82 y.o., male Today's Date: 05/09/2023  PCP: Okey Regal, MD REFERRING PROVIDER: Lonie Peak, MD  END OF SESSION:  End of Session - 05/09/23 1422     Visit Number 6    Number of Visits 7    Date for SLP Re-Evaluation 05/12/23    SLP Start Time 0851    SLP Stop Time  0920    SLP Time Calculation (min) 29 min    Activity Tolerance Patient tolerated treatment well                  Past Medical History:  Diagnosis Date   AAA (abdominal aortic aneurysm) (HCC)    stent graft put in 02/2009   Arthritis    CAD (coronary artery disease)    a. cath 2010 b. CAD s/p PCI to ramus and LCX on 03/04/15   Dysrhythmia    Gout    Hyperlipidemia    Hypertension    Paroxysmal atrial fibrillation (HCC)    Tobacco abuse    Past Surgical History:  Procedure Laterality Date   ABDOMINAL AORTIC ANEURYSM REPAIR  02/19/2009   performed by [facility]   ATRIAL FIBRILLATION ABLATION N/A 04/28/2017   Procedure: Atrial Fibrillation Ablation;  Surgeon: Regan Lemming, MD;  Location: [facility];  Service: Cardiovascular;  Laterality: N/A;   COLONOSCOPY     KNEE ARTHROSCOPY  2003   left   LEFT AND RIGHT HEART CATHETERIZATION WITH CORONARY ANGIOGRAM N/A 03/03/2015   Procedure: LEFT AND RIGHT HEART CATHETERIZATION WITH CORONARY ANGIOGRAM;  Surgeon: Lennette Bihari, MD;     MICROLARYNGOSCOPY N/A 09/14/2022   Procedure: MICROLARYNGOSCOPY WITH VOCAL CORD BIOPSY;  Surgeon: Serena Colonel, MD;  Location: [facility] OR;  Service: ENT;  Laterality: N/A;   PERCUTANEOUS CORONARY STENT INTERVENTION (PCI-S) N/A 03/04/2015   Procedure: PERCUTANEOUS CORONARY STENT INTERVENTION (PCI-S);  Surgeon: Lennette Bihari, MD; RI 99>>0% w/   BMS, bifurcation CFX-OM 90>>40% w/ Angiosculpt PTCA; dCFX 90>>0% w/   2.7520 mm Rebel BMS   SHOULDER ARTHROSCOPY WITH ROTATOR CUFF REPAIR AND SUBACROMIAL DECOMPRESSION  Left 11/19/2013   Procedure: LEFT SHOULDER ARTHROSCOPY DEBRIDEMENT EXTENTSIVE DISTAL CLAVICULECTOMY DECOMPRESSION PARTIAL ACROMIOPLASTY WITH CORACOACROMIAL WITH ROTATOR CUFF REPAIR ;  Surgeon: [name], MD;  Location: [facility];  Service: Orthopedics;  Laterality: Left;   Patient Active Problem List   Diagnosis Date Noted   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   Palpitations 11/15/2021   Chest pain 07/22/2018   Abnormal EKG 07/22/2018   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   AF (atrial fibrillation) (HCC) 04/28/2017   Premature ventricular contractions (PVCs) (VPCs) 03/11/2017   H/O endovascular stent graft for abdominal aortic aneurysm 09/01/2016   Cardiomyopathy, ischemic 03/30/2016   OSA (obstructive sleep apnea) 09/30/2015   Encounter for monitoring anti-arrhythmic therapy 05/04/2015   DOE (dyspnea on exertion) 03/16/2015   Atrial fibrillation with RVR (HCC) 03/16/2015   Hypertensive urgency 03/08/2015   Coronary artery disease due to lipid rich plaque    AA (aortic aneurysm) (HCC)    Dyspnea 03/02/2015   Progressive angina (HCC) 03/02/2015   History of TIA (transient ischemic attack), 02/20/15 03/02/2015   Mitral regurgitation 03/02/2015   HX: anticoagulation 03/02/2015   Chest tightness    Tobacco abuse    HLD (hyperlipidemia)    TIA (transient ischemic attack) 02/20/2015   Leukopenia 02/20/2015   Chronic combined systolic and diastolic heart failure,  NYHA class 1 (HCC) 02/20/2015   PAF (paroxysmal atrial fibrillation) (HCC) 01/29/2015   Accelerated hypertension, hx of 01/03/2015   Acute diastolic CHF (congestive heart failure), NYHA class 2 (HCC) 01/03/2015   Demand ischemia (HCC) 01/03/2015   Coronary artery disease involving native coronary artery of native heart without angina pectoris 01/03/2015   Elevated troponin level    Thrombocytopenia (HCC)    Essential hypertension    S/P abdominal aortic aneurysm repair 07/03/2012      ONSET DATE: Sept 2023   REFERRING DIAG:   C32.0 (ICD-10-CM) - Malignant neoplasm of glottis (HCC)    THERAPY DIAG:  Dysphagia, unspecified type  Rationale for Evaluation and Treatment: Rehabilitation  SUBJECTIVE:   SUBJECTIVE STATEMENT: Pt voice sounds WNL today. Eaten steak, sausage, eggs, chicken, waffles, fish.   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 Presented to PCP at the [place] in September with progressive hoarseness over the course of 1-2 years. 08/18/22 CT neck showed an irregularity of the anterior aspect of the left vocal cord with apparent extension to the left anterior hypoglottis.  At tumor board today there was also appreciation of paraglottic invasion on the left.  Examination was limited due to motion artifact. Chest CT also performed on that date showed emphysematous changes of the lungs with a few nonspecific bilateral lung nodules (measuring around 2-4 mm). CT also showed nonspecific borderline enlarged right lower paratracheal and AP window lymph nodes measuring up to 1 cm. 08/19/22 He saw Dr. Pollyann Kennedy for further evaluation. Indirect exam of the larynx performed revealed a left vocal cord mass. No palpable cervical adenopathy or masses were appreciated. Dr. Pollyann Kennedy recommended proceeding with microlaryngoscopy and biopsies of the laryngeal mass. 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  Per op note:"The larynx was normal except for the following: There is a partially exophytic mass involving the left true cord extending superiorly to the floor of the ventricle and inferiorly to the inferior aspect of the cord.  The subglottic larynx was not involved.  The lesion extended from the midportion of the left cord anteriorly to the anterior commissure and there did not appear to be involvement of the anterior commissure but no involvement of the right cord". 10/12/22 Consult with Dr.  Basilio Cairo. She plans 35 fractions of radiation. 10/21/22 Consult with Dr. Al Pimple. She has ordered a PET scan to be completed before final decision of chemotherapy is decided.  09/23/22 PET showed no evidence of metastatic disease. Treatment plan: 35 fractions of radiation to his Larynx and bilateral neck starting 10/26/22.   PAIN:  Are you having pain? No, unless swallows - then 1/10.   PATIENT GOALS: Maintain WNL swallowing  OBJECTIVE:   TREATMENT:  05/09/23: Pt has also eaten oranges. He has done HEP 5/7 days/week, "probably 25 reps.day" for effortful, pitch raise, Masako, Mendelsohn, and supraglottic. Pt answered when to complete HEP x2/week with A from SLP. Performance today with HEP was WNL, and when pt ate fig bar and drank water there were no overt s/sx oral or pharyngeal deficits. Pt agreed he could be seen in two months for next visit. Likely d/c next session.  03/09/23: HEP completed "probably what you want me to do" re: reps/frequency. SLP stressed minimum 20/day x6 days a week. SLP reiterated rationale for HEP to pt. He completed HEP with independence. Today he ate fig bar with extended mastication time and drank water without overt s/sx oral or pharyngeal dysphagia. Likely d/c next session.  02/09/23: Pt cont with ill-fitting dentures - dentist does not want to refit until gets auth from MD. Pt has eaten some softer foods but has also eaten more crunchy foods. Ate crackers today with extended mastication time, and without overt s/sx pharyngeal dysphagia.  HEP was completed with modified independence. Pt reports completing HEP as directed, and does so today with modified independence.   01/10/23: Pt is not chewing as much as he should due to ill-fitting dentures. Pt getting dentures re-sized next week. Saliva has thinned in the last 4 days and pt is now eating  solid foods. Today at home pt had grits, beef tenderloin, toast. Noodle soup for lunch yesterday. Today pt ate cereal bar, applesauce, and drank water with extended time for bolus formation and incr'd oral residue both likely due to xerostomia ("smacking" heard as pt chewing indicative of drier oral tissue), but no overt s/sx of pharyngeal dysphagia noted. Suboptimal completion of HEP since last two weeks of RT. Today pt's procedure was WNL and SLP reminded pt of rationale for BID completion and scope.  SLP educated pt about s/sx aspiration PNA and pt provided three of these with modified independence. SLP will defer food journal goal as pt is returning to WNL diet quickly and from what was seen today, safely as well.   12/01/22: Pt was performing supraglottic until last week but has stopped due to pain. He cont to perform high pitch "ee", Shaker, and Masako regularly. In the past week, pt has primarily completed PT exericses and Masako with high pitch "ee" due to pain/thick saliva. SLP suggested pt cycle through all exercises one rep for each exercise that needs a swallow. With HEP today (except Shaker) pt performed exercises with mod I.  SLP had to use total A for rationale for HEP.  He ate applesauce and drank water with WNL oral stage, and no overt s/sx pharyngeal stage deficits. Pt cannot chew food currently, due to he cannot wear lower dentures at this time.  10/27/22: Research states the risk for dysphagia increases due to radiation and/or chemotherapy treatment due to a variety of factors, so SLP educated the pt about the possibility of reduced/limited ability for PO intake during rad tx. SLP also educated pt regarding possible changes to swallowing musculature after rad tx, and why adherence to dysphagia HEP provided today and PO consumption was necessary to inhibit muscle fibrosis following rad tx and to mitigate muscle disuse atrophy. SLP informed pt why this would be detrimental to their  swallowing status and to their pulmonary health. Pt demonstrated understanding of these things to SLP. SLP encouraged pt to safely eat and drink as deep into their radiation/chemotherapy as possible to provide the best possible long-term swallowing outcome for  pt.    SLP then developed an individualized HEP for pt involving oral and pharyngeal and vocal  strengthening and ROM and pt was instructed how to perform these exercises, including SLP demonstration. After SLP demonstration, pt return demonstrated each exercise. SLP ensured pt performance was correct prior to educating pt on next exercise. Pt required rare min-mod cues faded to modified independent to perform HEP. Pt was instructed to complete this program 6-7 days/week, at least 2 times a day until 6 months after his or her last day of rad tx, and then x2 a week after that, indefinitely. Among other modifications for days when pt cannot functionally swallow, SLP also suggested pt to perform only non-swallowing tasks on the handout/HEP, and if necessary to cycle through the swallowing portion so the full program of exercises can be completed instead of fatiguing on one of the swallowing exercises and being unable to perform the other swallowing exercises. SLP instructed that swallowing exercises should then be added back into the regimen as pt is able to do so. Secondly, pt was told that former patients have told SLP that during their course of radiation therapy, taking prescribed pain medication just prior to performing HEP (and eating/drinking) has proven helpful in completing HEP (and eating and drinking) more regularly when going through their course of radiation treatment.    PATIENT EDUCATION: Education details: late effects head/neck radiation on swallow function and see "today's treatment" for more information Person educated: Patient and Spouse Education method: Explanation and Handouts (pt forgot HEP handout) Education comprehension:  verbalized understanding and needs further education   ASSESSMENT:  CLINICAL IMPRESSION: Recert today. Every month appointments preferred.  Patient is a 82 y.o. male who was seen today for treatment of swallowing after completion of radiation/chemoradiation therapy on 12/19/22. Today pt ate  fig bar , and drank thin liquids without overt s/sx oral or pharyngeal difficulty. At this time pt swallowing is deemed WNL/WFL with regular diet and thin liquids There are no overt s/s aspiration PNA observed by SLP nor any reported by pt at this time. Data indicate that pt's swallow ability will likely decrease over the course of radiation/chemoradiation therapy and could very well decline over time following the conclusion of RT due to muscle disuse atrophy and/or muscle fibrosis. Pt will cont to need to be seen by SLP in order to assess safety of PO intake, assess the need for recommending any objective swallow assessment, and ensuring pt is correctly completing the individualized HEP. Likely d/c after next visit in two months.  OBJECTIVE IMPAIRMENTS: include dysphagia. These impairments are limiting patient from safety when swallowing. Factors affecting potential to achieve goals and functional outcome are  none noted today . Patient will benefit from skilled SLP services to address above impairments and improve overall function.  REHAB POTENTIAL: Excellent   GOALS: Goals reviewed with patient? No  SHORT TERM GOALS: Target: 3rd total session  Pt will compelte HEP with modified independence in 2 sessions Baseline:12-01-22 Goal status: Met  2.  pt will tell SLP why pt is completing HEP with modified independence Baseline:  Goal status: Met  3.  pt will describe 3 overt s/s aspiration PNA with modified independence Baseline:  Goal status: Met  4.  pt will tell SLP how a food journal could hasten return to a more normalized diet Baseline:  Goal status: Met   LONG TERM GOALS: Target: 7th total  session  pt will complete HEP with independence over two visits Baseline: 02/09/23 Goal  status: Met  2.  pt will describe how to modify HEP over time, and the timeline associated with reduction in HEP frequency with modified independence over two sessions Baseline:  Goal status: Ongoing   PLAN:  SLP FREQUENCY:  approx every 4 weeks  SLP DURATION:  7 sessions  PLANNED INTERVENTIONS: Aspiration precaution training, Pharyngeal strengthening exercises, Diet toleration management , Environmental controls, Trials of upgraded texture/liquids, SLP instruction and feedback, Compensatory strategies, and Patient/family education    [facility], CCC-SLP 05/09/2023, 2:23 PM

## 2023-05-11 ENCOUNTER — Encounter: Payer: Self-pay | Admitting: Physical Therapy

## 2023-05-11 ENCOUNTER — Ambulatory Visit: Payer: No Typology Code available for payment source | Admitting: Physical Therapy

## 2023-05-11 DIAGNOSIS — I890 Lymphedema, not elsewhere classified: Secondary | ICD-10-CM

## 2023-05-11 DIAGNOSIS — R293 Abnormal posture: Secondary | ICD-10-CM

## 2023-05-11 DIAGNOSIS — L599 Disorder of the skin and subcutaneous tissue related to radiation, unspecified: Secondary | ICD-10-CM

## 2023-05-11 DIAGNOSIS — C320 Malignant neoplasm of glottis: Secondary | ICD-10-CM

## 2023-05-11 NOTE — Therapy (Signed)
OUTPATIENT PHYSICAL THERAPY HEAD AND NECK LYMPHEDEMA   Patient Name: Rodney [name] MRN: [id number] DOB:[birth year], 82 y.o., male Today's Date: 05/11/2023  END OF SESSION:  PT End of Session - 05/11/23 1004     Visit Number 5    Number of Visits 9    Date for PT Re-Evaluation 05/08/23    PT Start Time 1003    Activity Tolerance Patient tolerated treatment well    Behavior During Therapy [facility] for tasks assessed/performed              Past Medical History:  Diagnosis Date   AAA (abdominal aortic aneurysm) (HCC)    stent graft put in 02/2009   Arthritis    CAD (coronary artery disease)    a. cath 2010 b. CAD s/p PCI to ramus and LCX on 03/04/15   Dysrhythmia    Gout    Hyperlipidemia    Hypertension    Paroxysmal atrial fibrillation (HCC)    Tobacco abuse    Past Surgical History:  Procedure Laterality Date   ABDOMINAL AORTIC ANEURYSM REPAIR  02/19/2009   performed by [facility]   ATRIAL FIBRILLATION ABLATION N/A 04/28/2017   Procedure: Atrial Fibrillation Ablation;  Surgeon: Regan Lemming, MD;  Location: [facility] INVASIVE CV LAB;  Service: Cardiovascular;  Laterality: N/A;   COLONOSCOPY     KNEE ARTHROSCOPY  2003   left   LEFT AND RIGHT HEART CATHETERIZATION WITH CORONARY ANGIOGRAM N/A 03/03/2015   Procedure: LEFT AND RIGHT HEART CATHETERIZATION WITH CORONARY ANGIOGRAM;  Surgeon: Lennette Bihari, MD;     MICROLARYNGOSCOPY N/A 09/14/2022   Procedure: MICROLARYNGOSCOPY WITH VOCAL CORD BIOPSY;  Surgeon: Serena Colonel, MD;  Location: [facility] OR;  Service: ENT;  Laterality: N/A;   PERCUTANEOUS CORONARY STENT INTERVENTION (PCI-S) N/A 03/04/2015   Procedure: PERCUTANEOUS CORONARY STENT INTERVENTION (PCI-S);  Surgeon: Lennette Bihari, MD; RI 99>>0% w/   BMS, bifurcation CFX-OM 90>>40% w/ Angiosculpt PTCA; dCFX 90>>0% w/   2.7520 mm Rebel BMS   SHOULDER ARTHROSCOPY WITH ROTATOR CUFF REPAIR AND SUBACROMIAL DECOMPRESSION Left 11/19/2013   Procedure: LEFT SHOULDER ARTHROSCOPY DEBRIDEMENT EXTENTSIVE  DISTAL CLAVICULECTOMY DECOMPRESSION PARTIAL ACROMIOPLASTY WITH CORACOACROMIAL WITH ROTATOR CUFF REPAIR ;  Surgeon: [name], MD;  Location:  SURGERY CENTER;  Service: Orthopedics;  Laterality: Left;   Patient Active Problem List   Diagnosis Date Noted   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   Palpitations 11/15/2021   Chest pain 07/22/2018   Abnormal EKG 07/22/2018   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   AF (atrial fibrillation) (HCC) 04/28/2017   Premature ventricular contractions (PVCs) (VPCs) 03/11/2017   H/O endovascular stent graft for abdominal aortic aneurysm 09/01/2016   Cardiomyopathy, ischemic 03/30/2016   OSA (obstructive sleep apnea) 09/30/2015   Encounter for monitoring anti-arrhythmic therapy 05/04/2015   DOE (dyspnea on exertion) 03/16/2015   Atrial fibrillation with RVR (HCC) 03/16/2015   Hypertensive urgency 03/08/2015   Coronary artery disease due to lipid rich plaque    AA (aortic aneurysm) (HCC)    Dyspnea 03/02/2015   Progressive angina (HCC) 03/02/2015   History of TIA (transient ischemic attack), 02/20/15 03/02/2015   Mitral regurgitation 03/02/2015   HX: anticoagulation 03/02/2015   Chest tightness    Tobacco abuse    HLD (hyperlipidemia)    TIA (transient ischemic attack) 02/20/2015   Leukopenia 02/20/2015   Chronic combined systolic and diastolic heart failure, NYHA class 1 (HCC) 02/20/2015   PAF (paroxysmal atrial fibrillation) (HCC) 01/29/2015   Accelerated hypertension, hx of  01/03/2015   Acute diastolic CHF (congestive heart failure), NYHA class 2 (HCC) 01/03/2015   Demand ischemia (HCC) 01/03/2015   Coronary artery disease involving native coronary artery of native heart without angina pectoris 01/03/2015   Elevated troponin level    Thrombocytopenia (HCC)    Essential hypertension    S/P abdominal aortic aneurysm repair 07/03/2012    PCP: Burton Apley, MD, Dr. Alphonsa Gin at the [facility]  REFERRING PROVIDER: Lonie Peak, MD  REFERRING DIAG: C32.0 (ICD-10-CM) - Malignant neoplasm of glottis [facility])   THERAPY DIAG:  Lymphedema, not elsewhere classified  Disorder of the skin and subcutaneous tissue related to radiation, unspecified  Abnormal posture  Malignant neoplasm of glottis [facility])  Rationale for Evaluation and Treatment: Rehabilitation  ONSET DATE: 08/18/2022  SUBJECTIVE:                                                                                                                                                                                           SUBJECTIVE STATEMENT: I have been doing the massage. I have not had a [facility]. The chip pack is working well.   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 He presented to his PCP at the [place] in September with progressive hoarseness over the course of 1-2 years. 08/18/22 CT neck showed an irregularity of the anterior aspect of the left vocal cord with apparent extension to the left anterior hypoglottis.  At tumor board today there was also appreciation of paraglottic invasion on the left.  Examination was limited due to motion artifact. Chest CT also performed on that date showed emphysematous changes of the lungs with a few nonspecific bilateral lung nodules (measuring around 2-4 mm). CT also showed nonspecific borderline enlarged right lower paratracheal and AP window lymph nodes measuring up to 1 cm. 08/19/22 He saw Dr. Pollyann Kennedy for further evaluation. Indirect exam of the larynx performed revealed a left vocal cord mass. No palpable cervical adenopathy or masses were appreciated. Dr. 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  Per op note:"The larynx was normal except for the following: There is a partially exophytic mass involving the left true cord extending  superiorly to the floor of the ventricle and inferiorly to the inferior aspect of the cord.  The subglottic larynx was not involved.  The lesion extended from the midportion of the left cord anteriorly to the anterior commissure and there did not appear to be involvement of the anterior commissure but no involvement of  the right cord". He received 35 fractions of radiation to his Larynx and bilateral neck starting 10/26/22 and ending 12/19/2022. No Chemo was required as  11/15 PET was clear.   PATIENT GOALS: want to get swelling down  PAIN:  Are you having pain? No  PRECAUTIONS: Recent radiation, Head and neck lymphedema risk,    OBJECTIVE:   POSTURE:  Forward head and rounded shoulders posture  SHOULDER AROM:   WFLRTC surgery on left approx 10 years ago.  CERVICAL AROM:     EVAL on 10/27/22 01/05/2023 EVAL on 04/10/23 05/11/23  Flexion WFL WNL 25% limited   Extension 25% limited 15% limited 15% limited   Right lateral flexion 75% limited 55 % limited 80% limited 25% limited  Left lateral flexion 75% limited 65% 80% limited 25% limited  Right rotation 35% limited 30% limited 25% limited   Left rotation 35% limited 35%limited 25% limited                           (Blank rows=not tested)   GAIT: WNL but forward head and looking down   LYMPHEDEMA ASSESSMENT:    Circumference in cm 01/05/23 04/10/23 (pt has gained 20lbs since 12/2022) 04/25/23 05/11/23  4 cm superior to sternal notch around neck 38.3 43 42.8 42.5  6 cm superior to sternal notch around neck 38.5 44.6 42.3 42.5  8 cm superior to sternal notch around neck 38.8 46 43 42.5  R lateral nostril from base of nose to medial tragus      L lateral nostril from base of nose to medial tragus      R corner of mouth to where ear lobe meets face 10     L corner of mouth to where ear lobe meets face 10                 (Blank rows=not tested)  CURRENT/PAST TREATMENTS:  Surgery type/date: 09/14/2022  Microlaryngoscopy/biopsy  Chemotherapy: none  Radiation:35 fractions to larynx and bilateral neck starting 10/26/2022 and ending 12/19/2022  OTHER SYMPTOMS: Pain No Fibrosis No Pitting edema No Infections No Decreased scar mobility No  LLIS:  35.92  TREATMENT PERFORMED: 05/11/23: Remeasured circumferences and assessed progress towards goals. Discussed possible need for cardiologist visit since pt has new onset of ankle swelling and has history of afib and CHF. Demonstrated pt correct way to don head and neck garment once he receives it.   04/25/23:Remeasured circumferences and pt has had great decrease since eval. Continued MLD using Norton anterior approach handout as follows: short neck, 5 diaphragmatic breaths, bilateral axillary nodes, bilateral pectoral nodes, anterior chest, short neck, posterior neck moving fluid towards pathway aimed at lateral neck, then lateral and anterior neck moving fluid towards pathway aimed at lateral neck then retracing all steps.  04/20/23: Instructed pt and using Norton anterior approach handout as follows: short neck, 5 diaphragmatic breaths, bilateral axillary nodes, bilateral pectoral nodes, anterior chest, short neck, posterior neck moving fluid towards pathway aimed at lateral neck, then lateral and anterior neck moving fluid towards pathway aimed at lateral neck then retracing all steps. Had pt return demonstrate each step of the sequence and provided appropriate v/c and t/c with pt demonstrating excellent skin stretch and speed by end of session with very few v/c.   04/18/23: Instructed pt and spouse using Norton anterior approach handout as follows: short neck, 5 diaphragmatic breaths, bilateral axillary nodes, bilateral pectoral nodes, anterior chest, short neck, posterior neck  moving fluid towards pathway aimed at lateral neck, then lateral and anterior neck moving fluid towards pathway aimed at lateral neck then retracing all steps. Had pt's' wife return  demonstrate each step of the sequence and provided appropriate v/c and t/c with pt's wife demonstrating excellent skin stretch and speed by end of session with very few v/c.    04/10/23: Created foam chip pack for pt to wear around his neck for compression and to decrease fibrosis  PATIENT EDUCATION:  Education details: how lymphedema is managed through manual lymphatic drainage and compression garments, tribute head and Education officer, community Person educated: Patient and Spouse Education method: Programmer, multimedia, Facilities manager, and Handouts Education comprehension: verbalized understanding and returned demonstration  HOME EXERCISE PROGRAM: Wear chip pack for a minimum of 4 hours a day   ASSESSMENT:  CLINICAL IMPRESSION: Assessed pt's progress towards goals in therapy. He has now met all goals for therapy. He is either going to obtain his garment throughout the [place] (appropriate paperwork has been faxed to [place] - pt following up) or his will order it online. He was educated in correct donning technique. His neck circumferences have decreased greatly since beginning therapy. He does self MLD daily and wears his compression daily. He will be discharged from skilled PT services at this time.   Pt will benefit from skilled therapeutic intervention to improve on the following deficits: decreased knowledge of condition, decreased knowledge of use of DME, decreased ROM, increased edema, and postural dysfunction  PT treatment/interventions: ADL/Self care home management, Therapeutic exercises, Patient/Family education, Self Care, Orthotic/Fit training, Manual lymph drainage, Compression bandaging, Vasopneumatic device, Manual therapy, and Re-evaluation    GOALS: Goals reviewed with patient? Yes  LONG TERM GOALS:  (STG=LTG) GOALS Name Target Date  Goal status  1 Pt will improved bilateral neck lateral flexion to decrease tightness and improve mobility.  Baseline: 05/08/23 MET 05/11/23  2 Pt will demonstrate a 2 cm  decrease in edema at 8 cm superior to sternal notch to decrease risk of infection. 05/08/23 MET 05/11/23  3 Pt will obtain appropriate compression garments for long term management of lymphedema 05/08/23 MET 05/11/23  4           PLAN:  PT FREQUENCY/DURATION: d/c this visit  PLAN FOR NEXT SESSION: d/c this visit   [facility], PT 05/11/2023, 10:05 AM  PHYSICAL THERAPY DISCHARGE SUMMARY  Visits from Start of Care: 5  Current functional level related to goals / functional outcomes: All goals met   Remaining deficits: None   Education / Equipment: Self MLD, compression , lymphedema education   Patient agrees to discharge. Patient goals were met. Patient is being discharged due to meeting the stated rehab goals.  Milagros Loll [place], [place] 05/11/23 10:29 AM

## 2023-05-12 ENCOUNTER — Encounter: Payer: Self-pay | Admitting: Physical Therapy

## 2023-05-17 ENCOUNTER — Telehealth: Payer: Self-pay | Admitting: Internal Medicine

## 2023-05-17 DIAGNOSIS — I10 Essential (primary) hypertension: Secondary | ICD-10-CM

## 2023-05-17 NOTE — Telephone Encounter (Signed)
STAT if HR is under 50 or over 120 (normal HR is 60-100 beats per minute)  What is your heart rate? 48 5 mins ago but it was 47 and hour ago  Do you have a log of your heart rate readings (document readings)? No   Do you have any other symptoms? Very fatigue

## 2023-05-17 NOTE — Telephone Encounter (Signed)
Patient states his heart rate has been in the 50's lately but today it got down to 47 and have him fatigue. Blood pressure was 130/70 No chest pain or discomfort, no shortness of breath, headache, dizziness, nausea or vomiting.   Advised patient if his heart rate is less than 55 to hold his carvedilol this evening and in the morning. Before he take medications in the morning he will check blood pressure and heart rate and give [place] a call with readings. He will monitor heart rate this evening. Stay hydrated. ED precautions discussed with patient. Will forward to provider for further recommendations.

## 2023-05-18 ENCOUNTER — Telehealth: Payer: Self-pay | Admitting: Internal Medicine

## 2023-05-18 MED ORDER — CARVEDILOL 6.25 MG PO TABS
6.2500 mg | ORAL_TABLET | Freq: Two times a day (BID) | ORAL | 3 refills | Status: AC
Start: 2023-05-18 — End: ?

## 2023-05-18 MED ORDER — CARVEDILOL 6.25 MG PO TABS
6.2500 mg | ORAL_TABLET | Freq: Two times a day (BID) | ORAL | 3 refills | Status: DC
Start: 2023-05-18 — End: 2023-05-18

## 2023-05-18 NOTE — Telephone Encounter (Signed)
Patient aware of provider recommendations. He will come pick up new script being that he gets his medications from the [place].  He did not take carvedilol this morning and his heart rate is 51. He states really no change from yesterday and today

## 2023-05-18 NOTE — Telephone Encounter (Signed)
Called pt, but another nurse had already spoken with him.

## 2023-05-18 NOTE — Telephone Encounter (Signed)
Would advise decreasing carvedilol to 6.25 mg BID. He will likely need a new Rx for this.  Dr Rexene Edison

## 2023-05-18 NOTE — Addendum Note (Signed)
Addended by: Serita Sheller R on: 05/18/2023 12:58 PM   Modules accepted: Orders

## 2023-05-18 NOTE — Telephone Encounter (Signed)
Patient called today to say his pulse is still low today.  He states his pulse is 48, 51.  He has taken his medication today.  He was told that he would be hearing back from the office but he hasn't heard back.

## 2023-05-29 ENCOUNTER — Other Ambulatory Visit (HOSPITAL_COMMUNITY): Payer: Self-pay | Admitting: *Deleted

## 2023-05-29 DIAGNOSIS — R1310 Dysphagia, unspecified: Secondary | ICD-10-CM

## 2023-06-06 ENCOUNTER — Ambulatory Visit (HOSPITAL_COMMUNITY)
Admission: RE | Admit: 2023-06-06 | Discharge: 2023-06-06 | Disposition: A | Discharge status: Home / Self Care | Payer: No Typology Code available for payment source | Source: Ambulatory Visit | Attending: Internal Medicine | Admitting: Internal Medicine

## 2023-06-06 ENCOUNTER — Ambulatory Visit (HOSPITAL_COMMUNITY)
Admission: RE | Admit: 2023-06-06 | Discharge: 2023-06-06 | Disposition: A | Discharge status: Home / Self Care | Payer: No Typology Code available for payment source | Source: Ambulatory Visit

## 2023-06-06 DIAGNOSIS — Z8521 Personal history of malignant neoplasm of larynx: Secondary | ICD-10-CM | POA: Insufficient documentation

## 2023-06-06 DIAGNOSIS — R1310 Dysphagia, unspecified: Secondary | ICD-10-CM | POA: Insufficient documentation

## 2023-06-07 NOTE — Progress Notes (Signed)
Modified Barium Swallow Study  Patient Details  Name: Rodney [name] MRN: [id number] Date of Birth: 13-Oct-1941  Today's Date: 06/07/2023  Modified Barium Swallow completed.  Full report located under Chart Review in the Imaging Section.  History of Present Illness 82 yr old seen for outpatient MBS. Pt with history of malignant neoplasm of glottis. Per ENT OP note "There is a partially exophytic mass involving the left true cord extending superiorly to the floor of the ventricle and inferiorly to the inferior aspect of the cord.  The subglottic larynx was not involved. The lesion extended from the midportion of the left cord anteriorly to the anterior commissure and there did not appear to be involvement of the anterior commissure but no involvement of the right cord". Pt underwent 35 fractions of radiation from 10/2022- 12/2022. Pt has been seen at outpatient ST for HEP- last seen 05/09/23. Pt denies any dysphagia and is consuming regular texture, thin liquids. No pneumonia. Pt is active and plays golf several times a week. His vocal quality is normal. Other PMH: AAA, CAD, HTN, takes meds for GER per pt.   Clinical Impression Pt seen for ouptatient MBS with normal oromotor strength and ROM. His upper denture plate not donned however pt reports he is able to eat without it. His vocal quality was normal. The MBS Imp protocol was performed and pt's oral phase marked by adequate lingual control/cohesion and transport. He was able to prepare boluses and masticate regular texture timely without residuals.. Pharyngeal swallows were initiated at the posterior angle of the ramus. All components of the pharyngeal swallow were within normal limits in regards to laryngeal elevation, hyoid excursion, epiglottic inversion laryngeal closure and tongue base retraction. There were no episodes of of laryngeal penetration, aspiration and no residue during the swallow. The barium pill stopped mid esophagus with mild  retrograde movement of barium. Pill slowly started to descend esophagus and sip of thin barium assisted transit into stomach. Pt was educated on several strategies to promote esophageal phase. Recommend continue regular texture, thin liquids, pills with thin liquids and continue home exercise program from outpatient SLP and discussed benefits of HEP in regards to having radiation treatments. Factors that may increase risk of adverse event in presence of aspiration [employer]ye [employer]ance Coots 2021):    Swallow Evaluation Recommendations Recommendations: PO diet PO Diet Recommendation: Regular;Thin liquids (Level 0) Liquid Administration via: Cup;Straw Medication Administration: Whole meds with liquid Supervision: Patient able to self-feed Postural changes: Stay upright 30-60 min after meals;Position pt fully upright for meals Oral care recommendations: Oral care BID (2x/day)      Royce Macadamia 06/07/2023,8:20 AM

## 2023-06-14 ENCOUNTER — Other Ambulatory Visit: Payer: Self-pay

## 2023-06-14 ENCOUNTER — Encounter (HOSPITAL_COMMUNITY): Payer: Self-pay | Admitting: Emergency Medicine

## 2023-06-14 ENCOUNTER — Emergency Department (HOSPITAL_COMMUNITY): Payer: No Typology Code available for payment source

## 2023-06-14 ENCOUNTER — Emergency Department (HOSPITAL_COMMUNITY)
Admission: EM | Admit: 2023-06-14 | Discharge: 2023-06-14 | Disposition: A | Discharge status: Home / Self Care | Payer: No Typology Code available for payment source | Attending: Emergency Medicine | Admitting: Emergency Medicine

## 2023-06-14 DIAGNOSIS — R079 Chest pain, unspecified: Secondary | ICD-10-CM

## 2023-06-14 DIAGNOSIS — I2510 Atherosclerotic heart disease of native coronary artery without angina pectoris: Secondary | ICD-10-CM | POA: Diagnosis not present

## 2023-06-14 DIAGNOSIS — I10 Essential (primary) hypertension: Secondary | ICD-10-CM | POA: Diagnosis not present

## 2023-06-14 DIAGNOSIS — R0789 Other chest pain: Secondary | ICD-10-CM | POA: Insufficient documentation

## 2023-06-14 DIAGNOSIS — Z7901 Long term (current) use of anticoagulants: Secondary | ICD-10-CM | POA: Insufficient documentation

## 2023-06-14 DIAGNOSIS — Z7982 Long term (current) use of aspirin: Secondary | ICD-10-CM | POA: Insufficient documentation

## 2023-06-14 DIAGNOSIS — Z79899 Other long term (current) drug therapy: Secondary | ICD-10-CM | POA: Insufficient documentation

## 2023-06-14 LAB — BASIC METABOLIC PANEL
Anion gap: 7 (ref 5–15)
BUN: 10 mg/dL (ref 8–23)
CO2: 23 mmol/L (ref 22–32)
Calcium: 8.9 mg/dL (ref 8.9–10.3)
Chloride: 109 mmol/L (ref 98–111)
Creatinine, Ser: 1.18 mg/dL (ref 0.61–1.24)
GFR, Estimated: >60 mL/min (ref >60)
Glucose, Bld: 108 mg/dL — ABNORMAL HIGH (ref 70–99)
Potassium: 4 mmol/L (ref 3.5–5.1)
Sodium: 139 mmol/L (ref 135–145)

## 2023-06-14 LAB — BRAIN NATRIURETIC PEPTIDE: B Natriuretic Peptide: 227.6 pg/mL — ABNORMAL HIGH (ref 0.0–100.0)

## 2023-06-14 LAB — CBC
HCT: 40.6 % (ref 39.0–52.0)
Hemoglobin: 14.4 g/dL (ref 13.0–17.0)
MCH: 33.4 pg (ref 26.0–34.0)
MCHC: 35.5 g/dL (ref 30.0–36.0)
MCV: 94.2 fL (ref 80.0–100.0)
Platelets: 138 10*3/uL — ABNORMAL LOW (ref 150–400)
RBC: 4.31 MIL/uL (ref 4.22–5.81)
RDW: 13 % (ref 11.5–15.5)
WBC: 2.6 10*3/uL — ABNORMAL LOW (ref 4.0–10.5)
nRBC: 0 % (ref 0.0–0.2)

## 2023-06-14 LAB — TROPONIN I (HIGH SENSITIVITY)
Troponin I (High Sensitivity): 7 ng/L (ref <18)
Troponin I (High Sensitivity): 7 ng/L (ref <18)

## 2023-06-14 NOTE — ED Triage Notes (Signed)
Patient arrives in wheelchair by POV c/o right sided chest pain for past 3 days. Today pain radiated more towards left side of chest. Denies any shortness of breath. States he was in A-fib earlier today and states has hx of same. On eliquis. Pain has subsided prior to arrival. Patient reports medication change on his carvedilol about a month ago.

## 2023-06-14 NOTE — ED Provider Notes (Signed)
[place] EMERGENCY DEPARTMENT AT [facility] Provider Note   CSN: [id number] Arrival date & time: 06/14/23  1250     History  Chief Complaint  Patient presents with   Chest Pain    Rodney [name] is a 82 y.o. male.   Chest Pain  Pt has htn, hyperlipidemia, gout, cad, aaa, paroxysmal a fib.  Pt has been having intermittent pain in his chest for the last few days.  It is just a few seconds at a time.  They started to last a little longer so he came to the ED.   Aching type pain, no radiation, no nausea, diaphoresis, dyspnea.  Home Medications Prior to Admission medications   Medication Sig Start Date End Date Taking? Authorizing Provider  amLODipine (NORVASC) 5 MG tablet Take 1 tablet (5 mg total) daily by mouth. 10/17/17   Hilty, Lisette Abu, MD  apixaban (ELIQUIS) 5 MG TABS tablet Take 1 tablet (5 mg total) by mouth 2 (two) times daily. 05/26/15   Chrystie Nose, MD  aspirin EC 81 MG tablet Take 81 mg by mouth daily.    [provider]  atorvastatin (LIPITOR) 80 MG tablet Take 1 tablet (80 mg total) by mouth daily. Patient taking differently: Take 80 mg by mouth every evening. 05/21/15   Hilty, Lisette Abu, MD  carvedilol (COREG) 6.25 MG tablet Take 1 tablet (6.25 mg total) by mouth 2 (two) times daily. 05/18/23   Hilty, Lisette Abu, MD  diltiazem (CARDIZEM) 30 MG tablet TAKE 1 TABLET BY MOUTH FOUR TIMES DAILY AS NEEDED FOR IRREGULAR HEART RATE GREATER THAN 100 AND BLOOD PRESSURE GREATER THAN 100 02/18/22   Hilty, Lisette Abu, MD  famotidine (PEPCID) 40 MG tablet Take 1 tablet (40 mg total) by mouth 2 (two) times daily. 05/01/19   Hilty, Lisette Abu, MD  fish oil-omega-3 fatty acids 1000 MG capsule Take 1,000 mg by mouth in the morning.    [provider]  furosemide (LASIX) 20 MG tablet Take 20 mg by mouth in the morning.    [provider]  HYDROcodone-acetaminophen (HYCET) 7.5-325 mg/15 ml solution Take 10-15 mLs by mouth every 4 (four) hours as needed for  moderate pain. Take with food. 12/19/22   Lonie Peak, MD  irbesartan (AVAPRO) 300 MG tablet TAKE 1/2 TABLET(150 MG) BY MOUTH DAILY 02/24/20   Graciella Freer, PA-C  isosorbide mononitrate (IMDUR) 60 MG 24 hr tablet TAKE 1 TABLET BY MOUTH EVERY DAY ( CAN NOT BE FILL 06.04.2018) 03/14/18   Hilty, Lisette Abu, MD  KLOR-CON M10 10 MEQ tablet TAKE 1 TABLET BY MOUTH TWICE A DAY 11/19/18   Hilty, Lisette Abu, MD  lidocaine (XYLOCAINE) 2 % solution Patient: Mix 1part 2% viscous lidocaine, 1part H20. Swallow 10mL of diluted mixture, before meals and at bedtime, up to QID 10/31/22   Lonie Peak, MD  loratadine (CLARITIN) 10 MG tablet Take 10 mg by mouth in the morning.    [provider]  Multiple Vitamin (MULTIVITAMIN WITH MINERALS) TABS tablet Take 1 tablet by mouth daily.    [provider]  nicotine (NICODERM CQ - DOSED IN MG/24 HOURS) 14 mg/24hr patch Place 1 patch onto the skin daily. 09/28/22   [provider]  nitroGLYCERIN (NITROSTAT) 0.4 MG SL tablet DISSOLVE 1 TABLET UNDER THE TONGUE EVERY 5 MINUTES AS NEEDED FOR CHEST PAIN( SHORTNESS OF BREATH) 02/18/22   Hilty, Lisette Abu, MD  omeprazole (PRILOSEC) 20 MG capsule Take 20 mg by mouth daily  before breakfast.    [provider]      Allergies    Lisinopril and Iohexol    Review of Systems   Review of Systems  Cardiovascular:  Positive for chest pain.    Physical Exam Updated Vital Signs BP (!) 145/76   Pulse (!) 54   Temp 97.6 F (36.4 C)   Resp 18   Ht 1.88 m (6\' 2" )   Wt 88.5 kg   SpO2 100%   BMI 25.04 kg/m  Physical Exam Vitals and nursing note reviewed.  Constitutional:      General: He is not in acute distress.    Appearance: He is well-developed.  HENT:     Head: Normocephalic and atraumatic.     Right Ear: External ear normal.     Left Ear: External ear normal.  Eyes:     General: No scleral icterus.       Right eye: No discharge.        Left eye: No discharge.      Conjunctiva/sclera: Conjunctivae normal.  Neck:     Trachea: No tracheal deviation.  Cardiovascular:     Rate and Rhythm: Normal rate and regular rhythm.  Pulmonary:     Effort: Pulmonary effort is normal. No respiratory distress.     Breath sounds: Normal breath sounds. No stridor. No wheezing or rales.  Abdominal:     General: Bowel sounds are normal. There is no distension.     Palpations: Abdomen is soft.     Tenderness: There is no abdominal tenderness. There is no guarding or rebound.  Musculoskeletal:        General: No tenderness or deformity.     Cervical back: Neck supple.  Skin:    General: Skin is warm and dry.     Findings: No rash.  Neurological:     General: No focal deficit present.     Mental Status: He is alert.     Cranial Nerves: No cranial nerve deficit, dysarthria or facial asymmetry.     Sensory: No sensory deficit.     Motor: No abnormal muscle tone or seizure activity.     Coordination: Coordination normal.  Psychiatric:        Mood and Affect: Mood normal.     ED Results / Procedures / Treatments   Labs (all labs ordered are listed, but only abnormal results are displayed) Labs Reviewed  BASIC METABOLIC PANEL - Abnormal; Notable for the following components:      Result Value   Glucose, Bld 108 (*)    All other components within normal limits  CBC - Abnormal; Notable for the following components:   WBC 2.6 (*)    Platelets 138 (*)    All other components within normal limits  BRAIN NATRIURETIC PEPTIDE - Abnormal; Notable for the following components:   B Natriuretic Peptide 227.6 (*)    All other components within normal limits  TROPONIN I (HIGH SENSITIVITY)  TROPONIN I (HIGH SENSITIVITY)    EKG EKG Interpretation Date/Time:  Wednesday June 14 2023 12:59:51 EDT Ventricular Rate:  87 PR Interval:    QRS Duration:  128 QT Interval:  404 QTC Calculation: 486 R Axis:   -30  Text Interpretation: Wide QRS rhythm , ? afib Left axis  deviation Right bundle branch block Abnormal ECG When compared with ECG of 15-Nov-2021 01:57, No significant change since last tracing except p waves not clearly visible Confirmed by Linwood Dibbles [phone]) on 06/14/2023 6:34:05 PM  Radiology DG Chest 2 View  Result Date: 06/14/2023 CLINICAL DATA:  Chest pain EXAM: CHEST - 2 VIEW COMPARISON:  CT examination dated March 16, 2023 FINDINGS: The heart size and mediastinal contours are within normal limits. Emphysematous changes of the upper lobes. No focal consolidation or pleural effusion. The visualized skeletal structures are unremarkable. IMPRESSION: 1. No active cardiopulmonary disease. 2. Emphysematous changes of the upper lobes. Electronically Signed   By: Larose Hires D.O.   On: 06/14/2023 13:40    Procedures Procedures    Medications Ordered in ED Medications - No data to display  ED Course/ Medical Decision Making/ A&P                             Medical Decision Making Problems Addressed: Chest pain, unspecified type: acute illness or injury that poses a threat to life or bodily functions  Amount and/or Complexity of Data Reviewed Labs: ordered. Decision-making details documented in ED Course. Radiology: ordered and independent interpretation performed.   Patient presented to the ED for evaluation of chest discomfort.  Symptoms atypical for cardiac ischemia.  Patient was comfortable in the ED asymptomatic.  ED workup reassuring.  Troponin is normal.  BNP is slightly elevated but I am not seeing signs of CHF on exam or on x-ray.  Patient does have chronically low white blood cell count and platelet count but this is unchanged.  Doubt acute coronary syndrome, pulmonary embolism or pneumonia.  Evaluation and diagnostic testing in the emergency department does not suggest an emergent condition requiring admission or immediate intervention beyond what has been performed at this time.  The patient is safe for discharge and has been instructed to  return immediately for worsening symptoms, change in symptoms or any other concerns.  Patient appears appropriate for discharge and recommend outpatient follow-up with his cardiologist        Final Clinical Impression(s) / ED Diagnoses Final diagnoses:  Chest pain, unspecified type    Rx / DC Orders ED Discharge Orders     None         Linwood Dibbles, MD 06/14/23 2234

## 2023-06-14 NOTE — Discharge Instructions (Addendum)
Continue your current medications.  Follow-up with your cardiologist to be rechecked.  Return to the ED for recurrent symptoms

## 2023-07-10 ENCOUNTER — Ambulatory Visit: Payer: No Typology Code available for payment source | Attending: Radiation Oncology

## 2023-07-10 DIAGNOSIS — R1310 Dysphagia, unspecified: Secondary | ICD-10-CM | POA: Diagnosis not present

## 2023-07-10 DIAGNOSIS — R490 Dysphonia: Secondary | ICD-10-CM | POA: Insufficient documentation

## 2023-07-10 NOTE — Patient Instructions (Signed)
   If you have these difficulties with your swallowing with meals or with liquids, contact Dr. Basilio Cairo (if you are still being followed by her) or Dr. Pollyann Kennedy:  Coughing regularly with foods or with liquids "Getting strangled" with food or liquids A "gurgly" or "wet" voice when eating or drinking Difficulty with food passing through the throat that once was easy to pass through  Keep doing your exercises x3/week, indefinitely, to minimize risk of swallowing difficulty in the future

## 2023-07-10 NOTE — Therapy (Addendum)
OUTPATIENT SPEECH LANGUAGE PATHOLOGY ONCOLOGY TREATMENT/RECERTIFICATION-DISCHARGE  Patient Name: Rodney [name] MRN: [id number] DOB:December 07, 1941, 82 y.o., male Today's Date: 07/10/2023  PCP: Okey Regal, MD REFERRING PROVIDER: Lonie Peak, MD  END OF SESSION:  End of Session - 07/10/23 0940     Visit Number 7    Number of Visits 7    Date for SLP Re-Evaluation 07/10/23    SLP Start Time 0935    SLP Stop Time  1000    SLP Time Calculation (min) 25 min    Activity Tolerance Patient tolerated treatment well                  Past Medical History:  Diagnosis Date   AAA (abdominal aortic aneurysm) (HCC)    stent graft put in 02/2009   Arthritis    CAD (coronary artery disease)    a. cath 2010 b. CAD s/p PCI to ramus and LCX on 03/04/15   Dysrhythmia    Gout    Hyperlipidemia    Hypertension    Paroxysmal atrial fibrillation (HCC)    Tobacco abuse    Past Surgical History:  Procedure Laterality Date   ABDOMINAL AORTIC ANEURYSM REPAIR  02/19/2009   performed by [facility]   ATRIAL FIBRILLATION ABLATION N/A 04/28/2017   Procedure: Atrial Fibrillation Ablation;  Surgeon: Regan Lemming, MD;  Location: [facility] INVASIVE CV LAB;  Service: Cardiovascular;  Laterality: N/A;   COLONOSCOPY     KNEE ARTHROSCOPY  2003   left   LEFT AND RIGHT HEART CATHETERIZATION WITH CORONARY ANGIOGRAM N/A 03/03/2015   Procedure: LEFT AND RIGHT HEART CATHETERIZATION WITH CORONARY ANGIOGRAM;  Surgeon: Lennette Bihari, MD;     MICROLARYNGOSCOPY N/A 09/14/2022   Procedure: MICROLARYNGOSCOPY WITH VOCAL CORD BIOPSY;  Surgeon: Serena Colonel, MD;  Location: [facility] OR;  Service: ENT;  Laterality: N/A;   PERCUTANEOUS CORONARY STENT INTERVENTION (PCI-S) N/A 03/04/2015   Procedure: PERCUTANEOUS CORONARY STENT INTERVENTION (PCI-S);  Surgeon: Lennette Bihari, MD; RI 99>>0% w/   BMS, bifurcation CFX-OM 90>>40% w/ Angiosculpt PTCA; dCFX 90>>0% w/   2.7520 mm Rebel BMS   SHOULDER ARTHROSCOPY WITH ROTATOR CUFF REPAIR AND  SUBACROMIAL DECOMPRESSION Left 11/19/2013   Procedure: LEFT SHOULDER ARTHROSCOPY DEBRIDEMENT EXTENTSIVE DISTAL CLAVICULECTOMY DECOMPRESSION PARTIAL ACROMIOPLASTY WITH CORACOACROMIAL WITH ROTATOR CUFF REPAIR ;  Surgeon: [name], MD;  Location: [facility];  Service: Orthopedics;  Laterality: Left;   Patient Active Problem List   Diagnosis Date Noted   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   Palpitations 11/15/2021   Chest pain 07/22/2018   Abnormal EKG 07/22/2018   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   AF (atrial fibrillation) (HCC) 04/28/2017   Premature ventricular contractions (PVCs) (VPCs) 03/11/2017   H/O endovascular stent graft for abdominal aortic aneurysm 09/01/2016   Cardiomyopathy, ischemic 03/30/2016   OSA (obstructive sleep apnea) 09/30/2015   Encounter for monitoring anti-arrhythmic therapy 05/04/2015   DOE (dyspnea on exertion) 03/16/2015   Atrial fibrillation with RVR (HCC) 03/16/2015   Hypertensive urgency 03/08/2015   Coronary artery disease due to lipid rich plaque    AA (aortic aneurysm) (HCC)    Dyspnea 03/02/2015   Progressive angina (HCC) 03/02/2015   History of TIA (transient ischemic attack), 02/20/15 03/02/2015   Mitral regurgitation 03/02/2015   HX: anticoagulation 03/02/2015   Chest tightness    Tobacco abuse    HLD (hyperlipidemia)    TIA (transient ischemic attack) 02/20/2015   Leukopenia 02/20/2015   Chronic combined systolic and diastolic heart failure,  NYHA class 1 (HCC) 02/20/2015   PAF (paroxysmal atrial fibrillation) (HCC) 01/29/2015   Accelerated hypertension, hx of 01/03/2015   Acute diastolic CHF (congestive heart failure), NYHA class 2 (HCC) 01/03/2015   Demand ischemia (HCC) 01/03/2015   Coronary artery disease involving native coronary artery of native heart without angina pectoris 01/03/2015   Elevated troponin level    Thrombocytopenia (HCC)    Essential hypertension    S/P abdominal aortic  aneurysm repair 07/03/2012   SPEECH THERAPY RECERTIFICATION/DISCHARGE SUMMARY  Visits from Start of Care: 7  Current functional level related to goals / functional outcomes: Pt is eating all consistencies of foods at this time. See below this date. Pt will be seen with LTGs enforced this date and then will be d/c'd.   Remaining deficits: None.   Education / Equipment: See below.   Patient agrees to discharge. Patient goals were met. Patient is being discharged due to meeting the stated rehab goals..     ONSET DATE: Sept 2023   REFERRING DIAG:   C32.0 (ICD-10-CM) - Malignant neoplasm of glottis (HCC)    THERAPY DIAG:  Dysphagia, unspecified type  Hoarseness  Rationale for Evaluation and Treatment: Rehabilitation  SUBJECTIVE:   SUBJECTIVE STATEMENT: Pt voice sounds WNL today. Eaten sausage, eggs, [place], corn on the cob in the last 48 hours. MBS on 06/07/23 with results WFL/essentially WNL.  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 Presented to PCP at the [place] in September with progressive hoarseness over the course of 1-2 years. 08/18/22 CT neck showed an irregularity of the anterior aspect of the left vocal cord with apparent extension to the left anterior hypoglottis.  At tumor board today there was also appreciation of paraglottic invasion on the left.  Examination was limited due to motion artifact. Chest CT also performed on that date showed emphysematous changes of the lungs with a few nonspecific bilateral lung nodules (measuring around 2-4 mm). CT also showed nonspecific borderline enlarged right lower paratracheal and AP window lymph nodes measuring up to 1 cm. 08/19/22 He saw Dr. Pollyann Kennedy for further evaluation. Indirect exam of the larynx performed revealed a left vocal cord mass. No palpable cervical adenopathy or masses were appreciated. Dr. Pollyann Kennedy recommended proceeding with microlaryngoscopy and biopsies of the  laryngeal mass. 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  Per op note:"The larynx was normal except for the following: There is a partially exophytic mass involving the left true cord extending superiorly to the floor of the ventricle and inferiorly to the inferior aspect of the cord.  The subglottic larynx was not involved.  The lesion extended from the midportion of the left cord anteriorly to the anterior commissure and there did not appear to be involvement of the anterior commissure but no involvement of the right cord". 10/12/22 Consult with Dr. Basilio Cairo. She plans 35 fractions of radiation. 10/21/22 Consult with Dr. Al Pimple. She has ordered a PET scan to be completed before final decision of chemotherapy is decided.  09/23/22 PET showed no evidence of metastatic disease. Treatment plan: 35 fractions of radiation to his Larynx and bilateral neck starting 10/26/22.   PAIN:  Are you having pain? No, unless swallows - then 1/10.   PATIENT GOALS: Maintain WNL swallowing  OBJECTIVE:   TREATMENT:  07/10/23: Pt is completing exercises every other day - SLP told pt to keep this consistency in order to minimize risk of fibrosis and thus dysphagia. He ate fig bar and drank water today without overt s/sx of oral or pharyngeal deficits. He completed HEP with independence, and told SLP how to modify frequency over time. SLP educated pt on how to make exercises more challenging for the future, and who to contact should he have difficulties swallowing in the future.  He agrees with d/c today.  05/09/23: Pt has also eaten oranges. He has done HEP 5/7 days/week, "probably 25 reps.day" for effortful, pitch raise, Masako, Mendelsohn, and supraglottic. Pt answered when to complete HEP x2/week with A from SLP. Performance today with HEP was WNL,  and when pt ate fig bar and drank water there were no overt s/sx oral or pharyngeal deficits. Pt agreed he could be seen in two months for next visit. Likely d/c next session.  03/09/23: HEP completed "probably what you want me to do" re: reps/frequency. SLP stressed minimum 20/day x6 days a week. SLP reiterated rationale for HEP to pt. He completed HEP with independence. Today he ate fig bar with extended mastication time and drank water without overt s/sx oral or pharyngeal dysphagia. Likely d/c next session.  02/09/23: Pt cont with ill-fitting dentures - dentist does not want to refit until gets auth from MD. Pt has eaten some softer foods but has also eaten more crunchy foods. Ate crackers today with extended mastication time, and without overt s/sx pharyngeal dysphagia.  HEP was completed with modified independence. Pt reports completing HEP as directed, and does so today with modified independence.   01/10/23: Pt is not chewing as much as he should due to ill-fitting dentures. Pt getting dentures re-sized next week. Saliva has thinned in the last 4 days and pt is now eating solid foods. Today at home pt had grits, beef tenderloin, toast. Noodle soup for lunch yesterday. Today pt ate cereal bar, applesauce, and drank water with extended time for bolus formation and incr'd oral residue both likely due to xerostomia ("smacking" heard as pt chewing indicative of drier oral tissue), but no overt s/sx of pharyngeal dysphagia noted. Suboptimal completion of HEP since last two weeks of RT. Today pt's procedure was WNL and SLP reminded pt of rationale for BID completion and scope.  SLP educated pt about s/sx aspiration PNA and pt provided three of these with modified independence. SLP will defer food journal goal as pt is returning to WNL diet quickly and from what was seen today, safely as well.   12/01/22: Pt was performing supraglottic until last week but has stopped due to pain. He cont to perform high  pitch "ee", Shaker, and Masako regularly. In the past week, pt has primarily completed PT exericses and Masako with high pitch "ee" due to pain/thick saliva. SLP suggested pt cycle through all exercises one rep for each exercise that needs a swallow. With HEP today (except Shaker) pt performed exercises with mod I.  SLP had to use total A for rationale for HEP.  He ate applesauce and drank water with WNL oral stage, and no overt s/sx pharyngeal stage deficits. Pt cannot chew food currently, due to he cannot wear lower dentures at this time.  10/27/22: Research states the risk for dysphagia increases due to radiation and/or chemotherapy treatment due to a variety of factors, so SLP educated the pt about the possibility of reduced/limited ability for PO intake during rad tx.  SLP also educated pt regarding possible changes to swallowing musculature after rad tx, and why adherence to dysphagia HEP provided today and PO consumption was necessary to inhibit muscle fibrosis following rad tx and to mitigate muscle disuse atrophy. SLP informed pt why this would be detrimental to their swallowing status and to their pulmonary health. Pt demonstrated understanding of these things to SLP. SLP encouraged pt to safely eat and drink as deep into their radiation/chemotherapy as possible to provide the best possible long-term swallowing outcome for pt.    SLP then developed an individualized HEP for pt involving oral and pharyngeal and vocal  strengthening and ROM and pt was instructed how to perform these exercises, including SLP demonstration. After SLP demonstration, pt return demonstrated each exercise. SLP ensured pt performance was correct prior to educating pt on next exercise. Pt required rare min-mod cues faded to modified independent to perform HEP. Pt was instructed to complete this program 6-7 days/week, at least 2 times a day until 6 months after his or her last day of rad tx, and then x2 a week after that,  indefinitely. Among other modifications for days when pt cannot functionally swallow, SLP also suggested pt to perform only non-swallowing tasks on the handout/HEP, and if necessary to cycle through the swallowing portion so the full program of exercises can be completed instead of fatiguing on one of the swallowing exercises and being unable to perform the other swallowing exercises. SLP instructed that swallowing exercises should then be added back into the regimen as pt is able to do so. Secondly, pt was told that former patients have told SLP that during their course of radiation therapy, taking prescribed pain medication just prior to performing HEP (and eating/drinking) has proven helpful in completing HEP (and eating and drinking) more regularly when going through their course of radiation treatment.    PATIENT EDUCATION: Education details: late effects head/neck radiation on swallow function and see "today's treatment" for more information Person educated: Patient and Spouse Education method: Explanation and Handouts (pt forgot HEP handout) Education comprehension: verbalized understanding and needs further education   ASSESSMENT:  CLINICAL IMPRESSION: Recert/discharge today.  Patient is a 82 y.o. male who was seen today for treatment of swallowing after completion of radiation/chemoradiation therapy on 12/19/22. Today pt ate  fig bar , and drank thin liquids without overt s/sx oral or pharyngeal difficulty. At this time pt swallowing is deemed WNL/WFL with regular diet and thin liquids There are no overt s/s aspiration PNA observed by SLP nor any reported by pt at this time. Data indicate that pt's swallow ability will likely decrease over the course of radiation/chemoradiation therapy and could very well decline over time following the conclusion of RT due to muscle disuse atrophy and/or muscle fibrosis. Pt will cont to need to be seen by SLP in order to assess safety of PO intake, assess the  need for recommending any objective swallow assessment, and ensuring pt is correctly completing the individualized HEP. Discharge today.  OBJECTIVE IMPAIRMENTS: include dysphagia. These impairments are limiting patient from safety when swallowing. Factors affecting potential to achieve goals and functional outcome are  none noted today . Patient will benefit from skilled SLP services to address above impairments and improve overall function.  REHAB POTENTIAL: Excellent   GOALS: Goals reviewed with patient? No  SHORT TERM GOALS: Target: 3rd total session  Pt will compelte HEP with modified independence in 2 sessions Baseline:12-01-22 Goal status: Met  2.  pt will tell SLP  why pt is completing HEP with modified independence Baseline:  Goal status: Met  3.  pt will describe 3 overt s/s aspiration PNA with modified independence Baseline:  Goal status: Met  4.  pt will tell SLP how a food journal could hasten return to a more normalized diet Baseline:  Goal status: Met   LONG TERM GOALS: Target: 7th total session  pt will complete HEP with independence over two visits Baseline: 02/09/23 Goal status: Met  2.  pt will describe how to modify HEP over time, and the timeline associated with reduction in HEP frequency with modified independence over two sessions Baseline:  Goal status: Met   PLAN: Discharge today, after this visit.  PLANNED INTERVENTIONS: Aspiration precaution training, Pharyngeal strengthening exercises, Diet toleration management , Environmental controls, Trials of upgraded texture/liquids, SLP instruction and feedback, Compensatory strategies, and Patient/family education    [facility], CCC-SLP 07/10/2023, 9:55 AM

## 2023-07-24 ENCOUNTER — Other Ambulatory Visit: Payer: Self-pay | Admitting: Thoracic Surgery (Cardiothoracic Vascular Surgery)

## 2023-07-24 DIAGNOSIS — I7121 Aneurysm of the ascending aorta, without rupture: Secondary | ICD-10-CM

## 2023-07-25 ENCOUNTER — Other Ambulatory Visit: Payer: Self-pay

## 2023-07-25 MED ORDER — PREDNISONE 50 MG PO TABS: ORAL_TABLET | ORAL | 0 refills | Status: DC

## 2023-07-25 MED ORDER — DIPHENHYDRAMINE HCL 25 MG PO TABS: ORAL_TABLET | ORAL | 0 refills | Status: DC

## 2023-07-25 NOTE — Progress Notes (Signed)
Orders sent to patient's preferred pharmacy for pre medication prior to CT scan due to contrast dye allergy. Patient aware.

## 2023-08-17 ENCOUNTER — Encounter: Payer: Self-pay | Admitting: Hematology and Oncology

## 2023-08-17 ENCOUNTER — Encounter: Payer: Self-pay | Admitting: Radiation Oncology

## 2023-09-05 NOTE — Patient Instructions (Signed)
Patient is counseled regarding the importance of long term risk factor modification as they pertain to the presence of ischemic heart disease including avoiding the use of all tobacco products, dietary modifications and medical therapy for diabetes, cholesterol and lipid management, and regular exercise.     Make every effort to maintain a "heart-healthy" lifestyle with regular physical exercise and adherence to a low-fat, low-carbohydrate diet.  Continue to seek regular follow-up appointments with your primary care physician and/or cardiologist.   AVOID FLOUROQUINOLONES (Ex. Cipro) THIS CLASS OF ANTIBIOTICS CAN INCREASE YOUR RISK OF AORTIC DISSECTION

## 2023-09-05 NOTE — Progress Notes (Signed)
[address]       [place] [address]             [phone]    FREDY CHIRINOS [id number] [birth year]  History of Present Illness:  Rodney [name] is an 82 yo male with known history of HTN, CHF, HLD, Abdominal Aortic aneurysm with stenting, CAD with stenting, PAF, and history of tobacco abuse.  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  He recently completed radiation treatment on 12/19/2022.  He was referred to our office for evaluation of a 4.4 cm Aortic Aneurysm.  He was last evaluated in March of 2023 at which time it was recommended he continue surveillance.  He was in the ED over the summer with complaints of chest pain.  The workup did not reveal any evidence of acute process and he was discharged home.  He presents today for repeat CTA chest.  His most recent PET CT scan showed no evidence of acute disease.  Overall the patient is doing well.  He states that his episode over the summer that sent him to the ED was due to Atrial Fibrillation.  He is due to follow up with their office.  I told him we can help facilitate appointment.  He denies chest pain.  He only experiences shortness of breath with heavy exertion.  He states he continues to play golf regularly.  He is also no longer a smoker and has stopped drinking, which he thinks has helped with his heart rate.  Current Outpatient Medications on File Prior to Visit  Medication Sig Dispense Refill   amLODipine (NORVASC) 5 MG tablet Take 1 tablet (5 mg total) daily by mouth. 90 tablet 3   apixaban (ELIQUIS) 5 MG TABS tablet Take 1 tablet (5 mg total) by mouth 2 (two) times daily. 180 tablet 3   aspirin EC 81 MG tablet Take 81 mg by mouth daily.     atorvastatin (LIPITOR) 80 MG tablet Take 1 tablet (80 mg total) by mouth daily. (Patient taking differently: Take 80 mg by mouth every evening.) 90 tablet 3   carvedilol (COREG) 6.25 MG tablet Take 1 tablet (6.25 mg total) by mouth 2 (two) times daily. 180  tablet 3   diltiazem (CARDIZEM) 30 MG tablet TAKE 1 TABLET BY MOUTH FOUR TIMES DAILY AS NEEDED FOR IRREGULAR HEART RATE GREATER THAN 100 AND BLOOD PRESSURE GREATER THAN 100 45 tablet 6   diphenhydrAMINE (BENADRYL ALLERGY) 25 MG tablet Please take two(2) tablets 50 mg by mouth one(1) hour before scan. 30 tablet 0   famotidine (PEPCID) 40 MG tablet Take 1 tablet (40 mg total) by mouth 2 (two) times daily. 180 tablet 2   fish oil-omega-3 fatty acids 1000 MG capsule Take 1,000 mg by mouth in the morning.     furosemide (LASIX) 20 MG tablet Take 20 mg by mouth in the morning.     irbesartan (AVAPRO) 300 MG tablet TAKE 1/2 TABLET(150 MG) BY MOUTH DAILY 90 tablet 3   isosorbide mononitrate (IMDUR) 60 MG 24 hr tablet TAKE 1 TABLET BY MOUTH EVERY DAY ( CAN NOT BE FILL 06.04.2018) 90 tablet 2   KLOR-CON M10 10 MEQ tablet TAKE 1 TABLET BY MOUTH TWICE A DAY 180 tablet 3   loratadine (CLARITIN) 10 MG tablet Take 10 mg by mouth in the morning.     Multiple Vitamin (MULTIVITAMIN WITH MINERALS) TABS tablet Take 1 tablet by mouth daily.  nitroGLYCERIN (NITROSTAT) 0.4 MG SL tablet DISSOLVE 1 TABLET UNDER THE TONGUE EVERY 5 MINUTES AS NEEDED FOR CHEST PAIN( SHORTNESS OF BREATH) 25 tablet 3   omeprazole (PRILOSEC) 20 MG capsule Take 20 mg by mouth daily before breakfast.     Current Facility-Administered Medications on File Prior to Visit  Medication Dose Route Frequency Provider Last Rate Last Admin   0.9 %  sodium chloride infusion   Intravenous Once Lonie Peak, MD         BP (!) 145/84 (BP Location: Right Arm, Patient Position: Sitting)   Pulse 64   Resp 18   Ht 6\' 2"  (1.88 m)   Wt 204 lb (92.5 kg)   SpO2 96% Comment: RA  BMI 26.19 kg/m   Physical Exam  CTA Results: Pending.. unfortunately scan is not yet read.. per my measurements aneurysm appears to be stable at 4.5..   A/P:  Ascending Aortic Aneurysm- CTA today, not read.. my measurement shows 4.5 cm.. which would be stable.. will confirm  when officially read HTN- well controlled on current regimen, compliant and monitored closely HLD CAD Atrial Fibrillation on Eliquis- due for 1 year follow up.. message sent to schedulers H/O Glotic Cancer- PET CT from April shows no recurrence  RTC In 1 year with repeat CTA chest  Risk Modification:  Statin:  Yes  Smoking cessation instruction/counseling given:  Former  Patient was counseled on importance of Blood Pressure Control.  Despite Medical intervention if the patient notices persistently elevated blood pressure readings.  They are instructed to contact their Primary Care Physician  Please avoid use of Fluoroquinolones as this can potentially increase your risk of Aortic Rupture and/or Dissection  Patient educated on signs and symptoms of Aortic Dissection, handout also provided in AVS  Hartford Maulden, PA-C 09/11/23

## 2023-09-11 ENCOUNTER — Ambulatory Visit: Payer: Medicare Other | Admitting: Physician Assistant

## 2023-09-11 ENCOUNTER — Ambulatory Visit
Admission: RE | Admit: 2023-09-11 | Discharge: 2023-09-11 | Disposition: A | Discharge status: Home / Self Care | Payer: Medicare Other | Source: Ambulatory Visit | Attending: Thoracic Surgery (Cardiothoracic Vascular Surgery) | Admitting: Thoracic Surgery (Cardiothoracic Vascular Surgery)

## 2023-09-11 VITALS — BP 145/84 | HR 64 | Resp 18 | Ht 74.0 in | Wt 204.0 lb

## 2023-09-11 DIAGNOSIS — I7121 Aneurysm of the ascending aorta, without rupture: Secondary | ICD-10-CM | POA: Diagnosis not present

## 2023-09-11 MED ORDER — IOPAMIDOL (ISOVUE-370) INJECTION 76%
75.0000 mL | Freq: Once | INTRAVENOUS | Status: AC | PRN
  Administered 2023-09-11: 75 mL via INTRAVENOUS

## 2023-09-15 NOTE — Progress Notes (Addendum)
Mr. Rodney [name] presents today for follow up for completion of radiation therapy for glottic cancer, and to review PET scan results from 03/16/2023.    He completed treatment on 12-19-22.  Pain issues, if any:  pain from acid reflux at times Using a feeding tube?: does not have Weight changes, if any:  Wt Readings from Last 3 Encounters:  09/29/23 206 lb 3.2 oz (93.5 kg)  09/11/23 204 lb (92.5 kg)  06/14/23 195 lb (88.5 kg)   Swallowing issues, if any: no trouble with swallowing, eating and drinking well Smoking or chewing tobacco? Does not use Using fluoride toothpaste daily? none Last ENT visit was on: Dr. Pollyann Kennedy on 07-13-23, no scope performed Other notable issues, if any: Still concerned for thick saliva. Pt taste is coming back well. Overall doing well. Pt does have a constant cough from trying to clear sputum.    Vitals:   09/29/23 0923  BP: 115/72  Pulse: 61  Resp: 18  Temp: 97.7 F (36.5 C)  SpO2: 98%     CT ANGIO CHEST AORTA W/CM & OR WO/CM 09/11/2023  IMPRESSION: 4.1 cm ascending thoracic aortic aneurysm. Recommend annual imaging followup by CTA or MRA. This recommendation follows 2010 ACCF/AHA/AATS/ACR/ASA/SCA/SCAI/SIR/STS/SVM Guidelines for the Diagnosis and Management of Patients with Thoracic Aortic Disease. Circulation. 2010; 121: N829-F621. Aortic aneurysm NOS (ICD10-I71.9).   Coronary artery calcifications are noted.   Aortic Atherosclerosis (ICD10-I70.0) and Emphysema (ICD10-J43.9

## 2023-09-21 ENCOUNTER — Other Ambulatory Visit: Payer: Self-pay

## 2023-09-29 ENCOUNTER — Encounter: Payer: Self-pay | Admitting: Radiation Oncology

## 2023-09-29 ENCOUNTER — Ambulatory Visit
Admission: RE | Admit: 2023-09-29 | Discharge: 2023-09-29 | Disposition: A | Discharge status: Home / Self Care | Payer: Medicare Other | Source: Ambulatory Visit | Attending: Radiation Oncology | Admitting: Radiation Oncology

## 2023-09-29 VITALS — BP 115/72 | HR 61 | Temp 97.7°F | Resp 18 | Wt 206.2 lb

## 2023-09-29 DIAGNOSIS — Z7901 Long term (current) use of anticoagulants: Secondary | ICD-10-CM | POA: Diagnosis not present

## 2023-09-29 DIAGNOSIS — Z1329 Encounter for screening for other suspected endocrine disorder: Secondary | ICD-10-CM | POA: Insufficient documentation

## 2023-09-29 DIAGNOSIS — Z923 Personal history of irradiation: Secondary | ICD-10-CM | POA: Diagnosis not present

## 2023-09-29 DIAGNOSIS — C320 Malignant neoplasm of glottis: Secondary | ICD-10-CM

## 2023-09-29 DIAGNOSIS — Z7982 Long term (current) use of aspirin: Secondary | ICD-10-CM | POA: Insufficient documentation

## 2023-09-29 DIAGNOSIS — I700 Atherosclerosis of aorta: Secondary | ICD-10-CM | POA: Diagnosis not present

## 2023-09-29 DIAGNOSIS — Z8521 Personal history of malignant neoplasm of larynx: Secondary | ICD-10-CM | POA: Insufficient documentation

## 2023-09-29 DIAGNOSIS — J439 Emphysema, unspecified: Secondary | ICD-10-CM | POA: Insufficient documentation

## 2023-09-29 DIAGNOSIS — Z79899 Other long term (current) drug therapy: Secondary | ICD-10-CM | POA: Diagnosis not present

## 2023-09-29 DIAGNOSIS — I7121 Aneurysm of the ascending aorta, without rupture: Secondary | ICD-10-CM | POA: Insufficient documentation

## 2023-09-29 LAB — TSH: TSH: 1.273 u[IU]/mL (ref 0.350–4.500)

## 2023-09-29 MED ORDER — OXYMETAZOLINE HCL 0.05 % NA SOLN
1.0000 | Freq: Two times a day (BID) | NASAL | Status: DC
  Administered 2023-09-29: 1 via NASAL
  Filled 2023-09-29: qty 30

## 2023-09-29 NOTE — Progress Notes (Signed)
Radiation Oncology         [phone] ________________________________  Name: Rodney [name] MRN: [id number]  Date: 09/29/2023  DOB: 19-Feb-1941  Follow-Up Visit Note  CC: Rodney Shores, MD  Rodney Colonel, MD  Diagnosis and Prior Radiotherapy:       ICD-10-CM   1. Malignant neoplasm of glottis (HCC)  C32.0 oxymetazoline (AFRIN) 0.05 % nasal spray 1 spray      CHIEF COMPLAINT:  Here for follow-up and surveillance of glottic cancer and to review PET scan results from 03/16/23. He completed treatment on 12/19/22.  First Treatment Date: 2022-10-26 - Last Treatment Date: 2022-12-19   Plan Name: HN_Larynx Site: Larynx Technique: IMRT Mode: Photon Dose Per Fraction: 2 Gy Prescribed Dose (Delivered / Prescribed): 70 Gy / 70 Gy Prescribed Fxs (Delivered / Prescribed): 35 / 35  Narrative:   Rodney [name]    He completed treatment on 12-19-22.  Pain issues, if any:  pain from acid reflux at times Using a feeding tube?: does not have Weight changes, if any:  Wt Readings from Last 3 Encounters:  09/29/23 206 lb 3.2 oz (93.5 kg)  09/11/23 204 lb (92.5 kg)  06/14/23 195 lb (88.5 kg)   Swallowing issues, if any: no trouble with swallowing, eating and drinking well Smoking or chewing tobacco? Does not use Using fluoride toothpaste daily? none Last ENT visit was on: Dr. Pollyann Kennedy on 07-13-23, no scope performed Other notable issues, if any: Still concerned for thick saliva. Pt taste is coming back well. Overall doing well. Pt does have a constant cough from trying to clear sputum.    Vitals:   09/29/23 0923  BP: 115/72  Pulse: 61  Resp: 18  Temp: 97.7 [name] (36.5 C)  SpO2: 98%     CT ANGIO CHEST AORTA W/CM & OR WO/CM 09/11/2023  IMPRESSION: 4.1 cm ascending thoracic aortic aneurysm. Recommend annual imaging followup by CTA or MRA. This recommendation follows 2010 ACCF/AHA/AATS/ACR/ASA/SCA/SCAI/SIR/STS/SVM Guidelines for the Diagnosis and  Management of Patients with Thoracic Aortic Disease. Circulation. 2010; 121: M010-U725. Aortic aneurysm NOS (ICD10-I71.9).   Coronary artery calcifications are noted.   Aortic Atherosclerosis (ICD10-I70.0) and Emphysema (ICD10-J43.9    ALLERGIES:  is allergic to lisinopril and iohexol.  Meds: Current Outpatient Medications  Medication Sig Dispense Refill   amLODipine (NORVASC) 5 MG tablet Take 1 tablet (5 mg total) daily by mouth. 90 tablet 3   apixaban (ELIQUIS) 5 MG TABS tablet Take 1 tablet (5 mg total) by mouth 2 (two) times daily. 180 tablet 3   aspirin EC 81 MG tablet Take 81 mg by mouth daily.     atorvastatin (LIPITOR) 80 MG tablet Take 1 tablet (80 mg total) by mouth daily. (Patient taking differently: Take 80 mg by mouth every evening.) 90 tablet 3   carvedilol (COREG) 6.25 MG tablet Take 1 tablet (6.25 mg total) by mouth 2 (two) times daily. 180 tablet 3   diltiazem (CARDIZEM) 30 MG tablet TAKE 1 TABLET BY MOUTH FOUR TIMES DAILY AS NEEDED FOR IRREGULAR HEART RATE GREATER THAN 100 AND BLOOD PRESSURE GREATER THAN 100 45 tablet 6   diphenhydrAMINE (BENADRYL ALLERGY) 25 MG tablet Please take two(2) tablets 50 mg by mouth one(1) hour before scan. 30 tablet 0   famotidine (PEPCID) 40 MG tablet Take 1 tablet (40 mg total) by mouth 2 (two) times daily. 180 tablet 2   fish oil-omega-3 fatty acids 1000 MG capsule Take 1,000 mg by mouth in the  morning.     furosemide (LASIX) 20 MG tablet Take 20 mg by mouth in the morning.     irbesartan (AVAPRO) 300 MG tablet TAKE 1/2 TABLET(150 MG) BY MOUTH DAILY 90 tablet 3   isosorbide mononitrate (IMDUR) 60 MG 24 hr tablet TAKE 1 TABLET BY MOUTH EVERY DAY ( CAN NOT BE FILL 06.04.2018) 90 tablet 2   KLOR-CON M10 10 MEQ tablet TAKE 1 TABLET BY MOUTH TWICE A DAY 180 tablet 3   loratadine (CLARITIN) 10 MG tablet Take 10 mg by mouth in the morning.     Multiple Vitamin (MULTIVITAMIN WITH MINERALS) TABS tablet Take 1 tablet by mouth daily.      nitroGLYCERIN (NITROSTAT) 0.4 MG SL tablet DISSOLVE 1 TABLET UNDER THE TONGUE EVERY 5 MINUTES AS NEEDED FOR CHEST PAIN( SHORTNESS OF BREATH) 25 tablet 3   omeprazole (PRILOSEC) 20 MG capsule Take 20 mg by mouth daily before breakfast.     Current Facility-Administered Medications  Medication Dose Route Frequency Provider Last Rate Last Admin   oxymetazoline (AFRIN) 0.05 % nasal spray 1 spray  1 spray Each Nare BID Lonie Peak, MD   1 spray at 09/29/23 1010   Facility-Administered Medications Ordered in Other Encounters  Medication Dose Route Frequency Provider Last Rate Last Admin   0.9 %  sodium chloride infusion   Intravenous Once Lonie Peak, MD        Physical Findings: The patient is in no acute distress. Patient is alert and oriented. Wt Readings from Last 3 Encounters:  09/29/23 206 lb 3.2 oz (93.5 kg)  09/11/23 204 lb (92.5 kg)  06/14/23 195 lb (88.5 kg)    weight is 206 lb 3.2 oz (93.5 kg). His oral temperature is 97.7 [name] (36.5 C). His blood pressure is 115/72 and his pulse is 61. His respiration is 18 and oxygen saturation is 98%. .  General: Alert and oriented, in no acute distress HEENT: Head is normocephalic. Extraocular movements are intact. Oropharynx is notable for no notable lesions or masses.  Neck: Neck is notable for mild to moderate anterior lymphedema.   Skin: Skin in treatment fields shows satisfactory healing around the neck.  Heart: Regular in rate and rhythm with no murmurs, rubs, or gallops. Chest: Clear to auscultation bilaterally, with no rhonchi, wheezes, or rales. Abdomen: Soft, nontender, nondistended, with no rigidity or guarding. Extremities: No cyanosis or edema. Lymphatics: see Neck Exam Psychiatric: Judgment and insight are intact. Affect is appropriate.  PROCEDURE NOTE: After obtaining consent and spraying nasal cavity with topical oxymetazoline, the flexible endoscope was coating in lidocaine gel and introduced and passed through the nasal  cavity.   The nasopharynx, oropharynx, hypopharynx, and larynx  were then examined. No lesions appreciated in the mucosal axis. The true cords were symmetrically mobile.    Lab Findings: Lab Results  Component Value Date   WBC 2.6 (L) 06/14/2023   HGB 14.4 06/14/2023   HCT 40.6 06/14/2023   MCV 94.2 06/14/2023   PLT 138 (L) 06/14/2023    Lab Results  Component Value Date   TSH 0.522 10/21/2022    Radiographic Findings: CT ANGIO CHEST AORTA W/CM & OR WO/CM  Result Date: 09/11/2023 CLINICAL DATA:  Thoracic aortic aneurysm. EXAM: CT ANGIOGRAPHY CHEST WITH CONTRAST TECHNIQUE: Multidetector CT imaging of the chest was performed using the standard protocol during bolus administration of intravenous contrast. Multiplanar CT image reconstructions and MIPs were obtained to evaluate the vascular anatomy. RADIATION DOSE REDUCTION: This exam was performed according to the  departmental dose-optimization program which includes automated exposure control, adjustment of the mA and/or kV according to patient size and/or use of iterative reconstruction technique. CONTRAST:  75mL ISOVUE-370 IOPAMIDOL (ISOVUE-370) INJECTION 76% COMPARISON:  Apr 26, 2017. FINDINGS: Cardiovascular: 4.1 cm ascending thoracic aortic aneurysm is noted. No dissection is noted. Great vessels are widely patent. Normal cardiac size. No pericardial effusion. Coronary artery calcifications are noted. Mediastinum/Nodes: No enlarged mediastinal, hilar, or axillary lymph nodes. Thyroid gland, trachea, and esophagus demonstrate no significant findings. Lungs/Pleura: No pneumothorax or pleural effusion is noted. Emphysematous disease is noted. No acute pulmonary abnormality is noted. Upper Abdomen: No acute abnormality. Musculoskeletal: No chest wall abnormality. No acute or significant osseous findings. Review of the MIP images confirms the above findings. IMPRESSION: 4.1 cm ascending thoracic aortic aneurysm. Recommend annual imaging followup by  CTA or MRA. This recommendation follows 2010 ACCF/AHA/AATS/ACR/ASA/SCA/SCAI/SIR/STS/SVM Guidelines for the Diagnosis and Management of Patients with Thoracic Aortic Disease. Circulation. 2010; 121: N562-Z308. Aortic aneurysm NOS (ICD10-I71.9). Coronary artery calcifications are noted. Aortic Atherosclerosis (ICD10-I70.0) and Emphysema (ICD10-J43.9). Electronically Signed   By: Lupita Raider M.D.   On: 09/11/2023 15:40    Impression/Plan:    1) Head and Neck Cancer Status: No evidence of disease   2) Nutritional Status: Weight has been stable to increased, no nutritional complaints.  Wt Readings from Last 3 Encounters:  09/29/23 206 lb 3.2 oz (93.5 kg)  09/11/23 204 lb (92.5 kg)  06/14/23 195 lb (88.5 kg)     PEG tube: N/A  3) Risk Factors: The patient has been educated about risk factors including alcohol and tobacco abuse; they understand that avoidance of alcohol and tobacco is important to prevent recurrences as well as other cancers. Patient is not smoking.   4) Swallowing: Patient is eating and drinking a wide variety of foods.   5) Dental: Encouraged to continue regular followup with dentistry, and dental hygiene including fluoride rinses.  6) Thyroid function: TSH is pending today Lab Results  Component Value Date   TSH 0.522 10/21/2022    7) Other: Lymphedema: This has improved since his previous visit and referral to physical therapy. Cough and thick secretions: Recommend he try Mucinex DM over-the-counter.  8) Follow-up with me again in 6 months.  Continue to follow with ENT.  On date of service, in total, I spent 30 minutes on this encounter. Patient was seen in person.   -------------------    Lonie Peak, MD

## 2023-09-30 ENCOUNTER — Other Ambulatory Visit: Payer: Self-pay

## 2023-10-17 NOTE — Progress Notes (Unsigned)
  Electrophysiology Office Note:   Date:  10/18/2023  ID:  THADDUS MCDOWELL, DOB 1941-11-06, MRN [id number]  Primary Cardiologist: None Electrophysiologist: Will Jorja Loa, MD      History of Present Illness:   Rodney [name] is a 82 y.o. male with h/o CAD s/p PCIs, HTN, HLD, AAA post stent grafting in 2012, PAF, OSA, and HFrEF seen today for routine electrophysiology followup.   Since last being seen in our clinic the patient reports doing well from a cardiac perspective. Was seen in ED for atypical chest pain in July, work up was unremarkable.  he denies chest pain, palpitations, dyspnea, PND, orthopnea, nausea, vomiting, dizziness, syncope, edema, weight gain, or early satiety.   Review of systems complete and found to be negative unless listed in HPI.   EP Information / Studies Reviewed:    EKG is ordered today. Personal review as below.  EKG Interpretation Date/Time:  Wednesday October 18 2023 08:20:00 EST Ventricular Rate:  71 PR Interval:  204 QRS Duration:  138 QT Interval:  390 QTC Calculation: 423 R Axis:   -25  Text Interpretation: Normal sinus rhythm Right bundle branch block T wave abnormality, consider inferolateral ischemia Confirmed by Maxine Glenn [phone]) on 10/18/2023 8:24:01 AM    Arrhythmia History  S/p Ablation 2018 Amiodarone stopped due to intolerance Tikosyn stopped due to intolerance  Physical Exam:   VS:  BP 136/78 (BP Location: Left Arm, Patient Position: Sitting, Cuff Size: Normal)   Pulse 71   Ht 6\' 2"  (1.88 m)   Wt 211 lb (95.7 kg)   SpO2 97%   BMI 27.09 kg/m    Wt Readings from Last 3 Encounters:  10/18/23 211 lb (95.7 kg)  09/29/23 206 lb 3.2 oz (93.5 kg)  09/11/23 204 lb (92.5 kg)     GEN: Well nourished, well developed in no acute distress NECK: No JVD; No carotid bruits CARDIAC: Regular rate and rhythm, no murmurs, rubs, gallops RESPIRATORY:  Clear to auscultation without rales, wheezing or rhonchi  ABDOMEN: Soft, non-tender,  non-distended EXTREMITIES:  No edema; No deformity   ASSESSMENT AND PLAN:    Paroxysmal AF EKG today shows NSR Continue eliquis for CHA2DS2VASc  of at least 7 Continue coreg 6.25 mg BID Continue dilt 30 mg prn  HTN Stable on current regimen   CAD No s/s ischemia   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 Elberta Fortis in 12 months  Signed, Graciella Freer, PA-C

## 2023-10-18 ENCOUNTER — Ambulatory Visit: Payer: Medicare Other | Attending: Student | Admitting: Student

## 2023-10-18 ENCOUNTER — Encounter: Payer: Self-pay | Admitting: Student

## 2023-10-18 VITALS — BP 136/78 | HR 71 | Ht 74.0 in | Wt 211.0 lb

## 2023-10-18 DIAGNOSIS — I10 Essential (primary) hypertension: Secondary | ICD-10-CM | POA: Diagnosis not present

## 2023-10-18 DIAGNOSIS — I480 Paroxysmal atrial fibrillation: Secondary | ICD-10-CM

## 2023-10-18 DIAGNOSIS — I2510 Atherosclerotic heart disease of native coronary artery without angina pectoris: Secondary | ICD-10-CM | POA: Diagnosis not present

## 2023-10-18 DIAGNOSIS — I5032 Chronic diastolic (congestive) heart failure: Secondary | ICD-10-CM | POA: Diagnosis not present

## 2023-10-18 NOTE — Patient Instructions (Signed)
Medication Instructions:  Your physician recommends that you continue on your current medications as directed. Please refer to the Current Medication list given to you today.  *If you need a refill on your cardiac medications before your next appointment, please call your pharmacy*  Lab Work: None ordered If you have labs (blood work) drawn today and your tests are completely normal, you will receive your results only by: MyChart Message (if you have MyChart) OR A paper copy in the mail If you have any lab test that is abnormal or we need to change your treatment, we will call you to review the results.  Follow-Up: At [facility], you and your health needs are our priority.  As part of our continuing mission to provide you with exceptional heart care, we have created designated Provider Care Teams.  These Care Teams include your primary Cardiologist (physician) and Advanced Practice Providers (APPs -  Physician Assistants and Nurse Practitioners) who all work together to provide you with the care you need, when you need it.  Your next appointment:   1 year(s)  Provider:   Steffanie Dunn, MD or Doreatha Martin, PA-C

## 2023-11-05 IMAGING — CR DG CHEST 2V
2 series · 2 of 2 positions shown · non-contrast
Comparison: 09/09/2021

CLINICAL DATA: Shortness of breath.  Hypertension.  Tachycardia.

EXAM:
CHEST - 2 VIEW

[w chest pa]
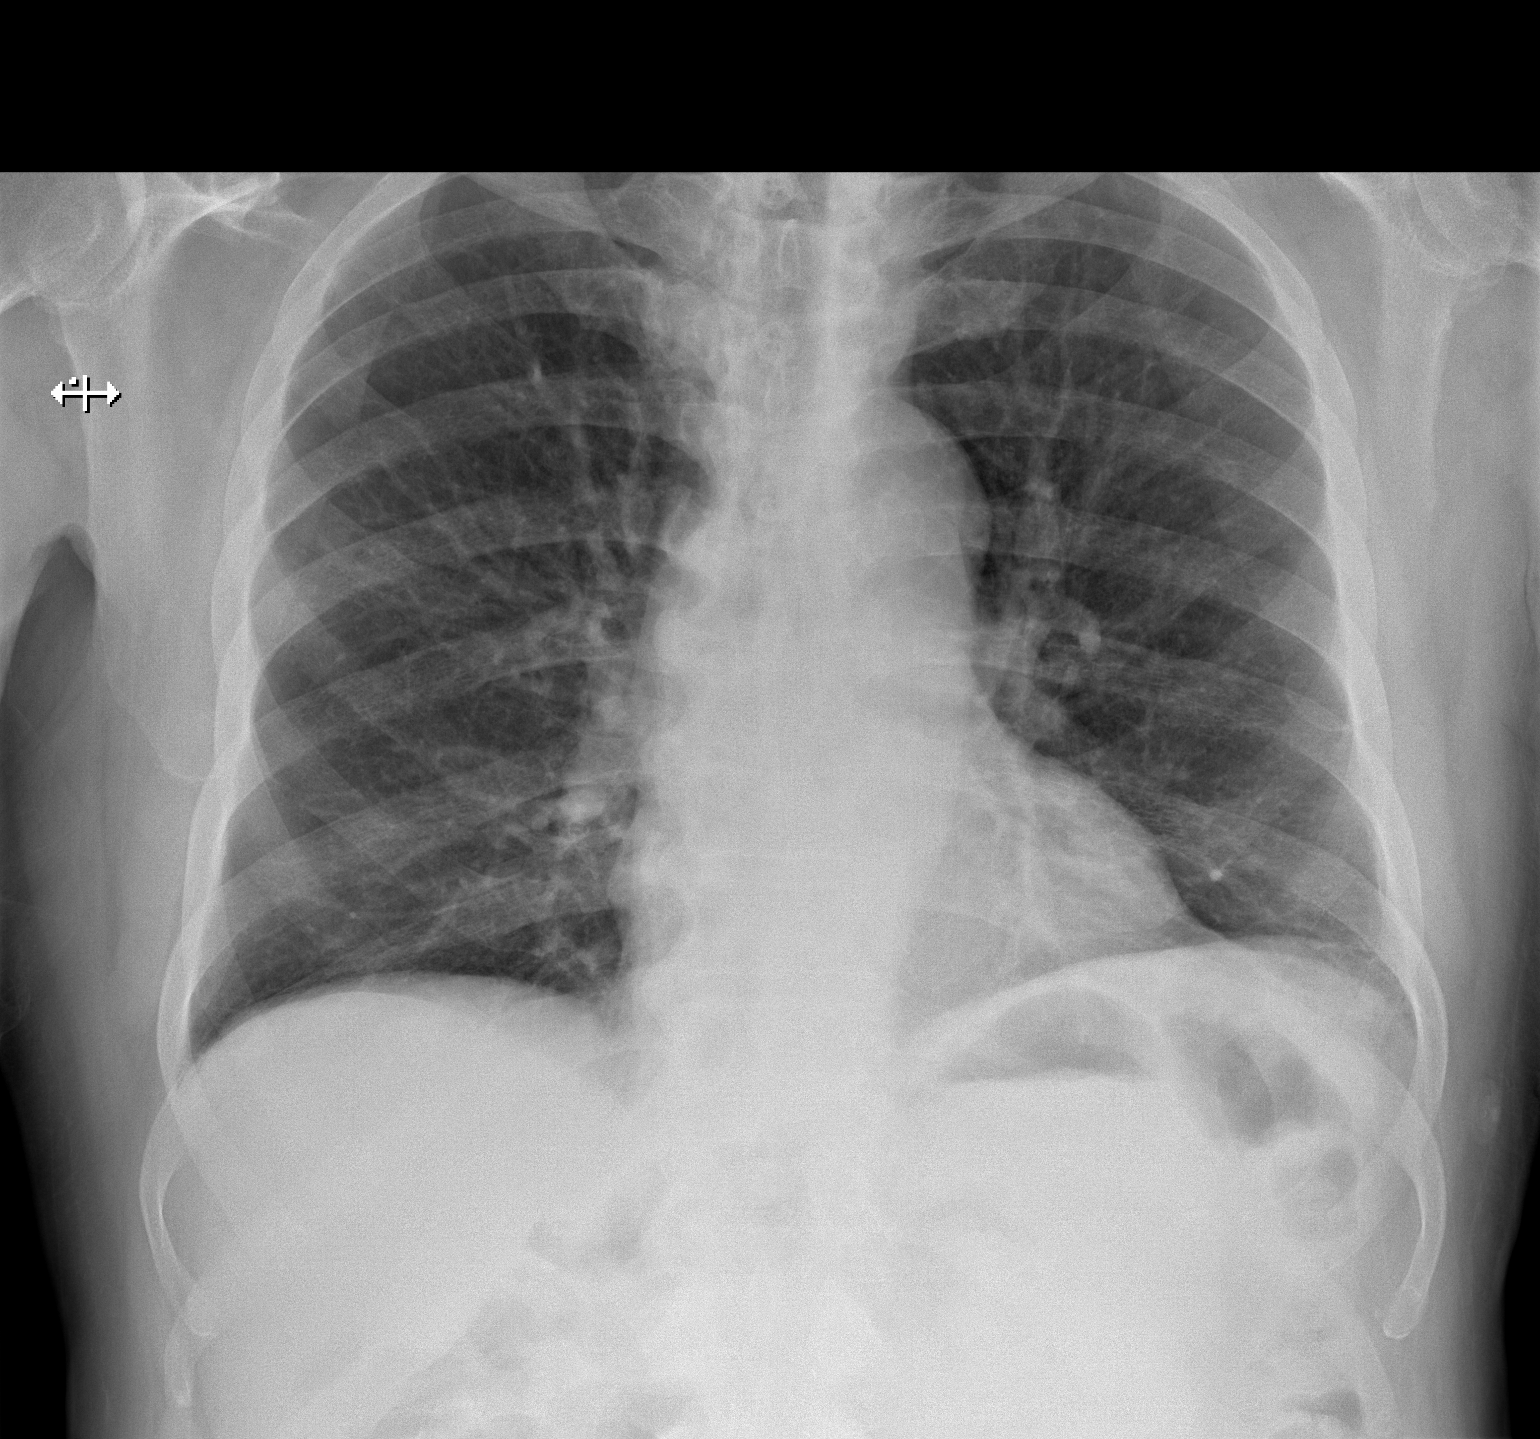

[w chest lat]
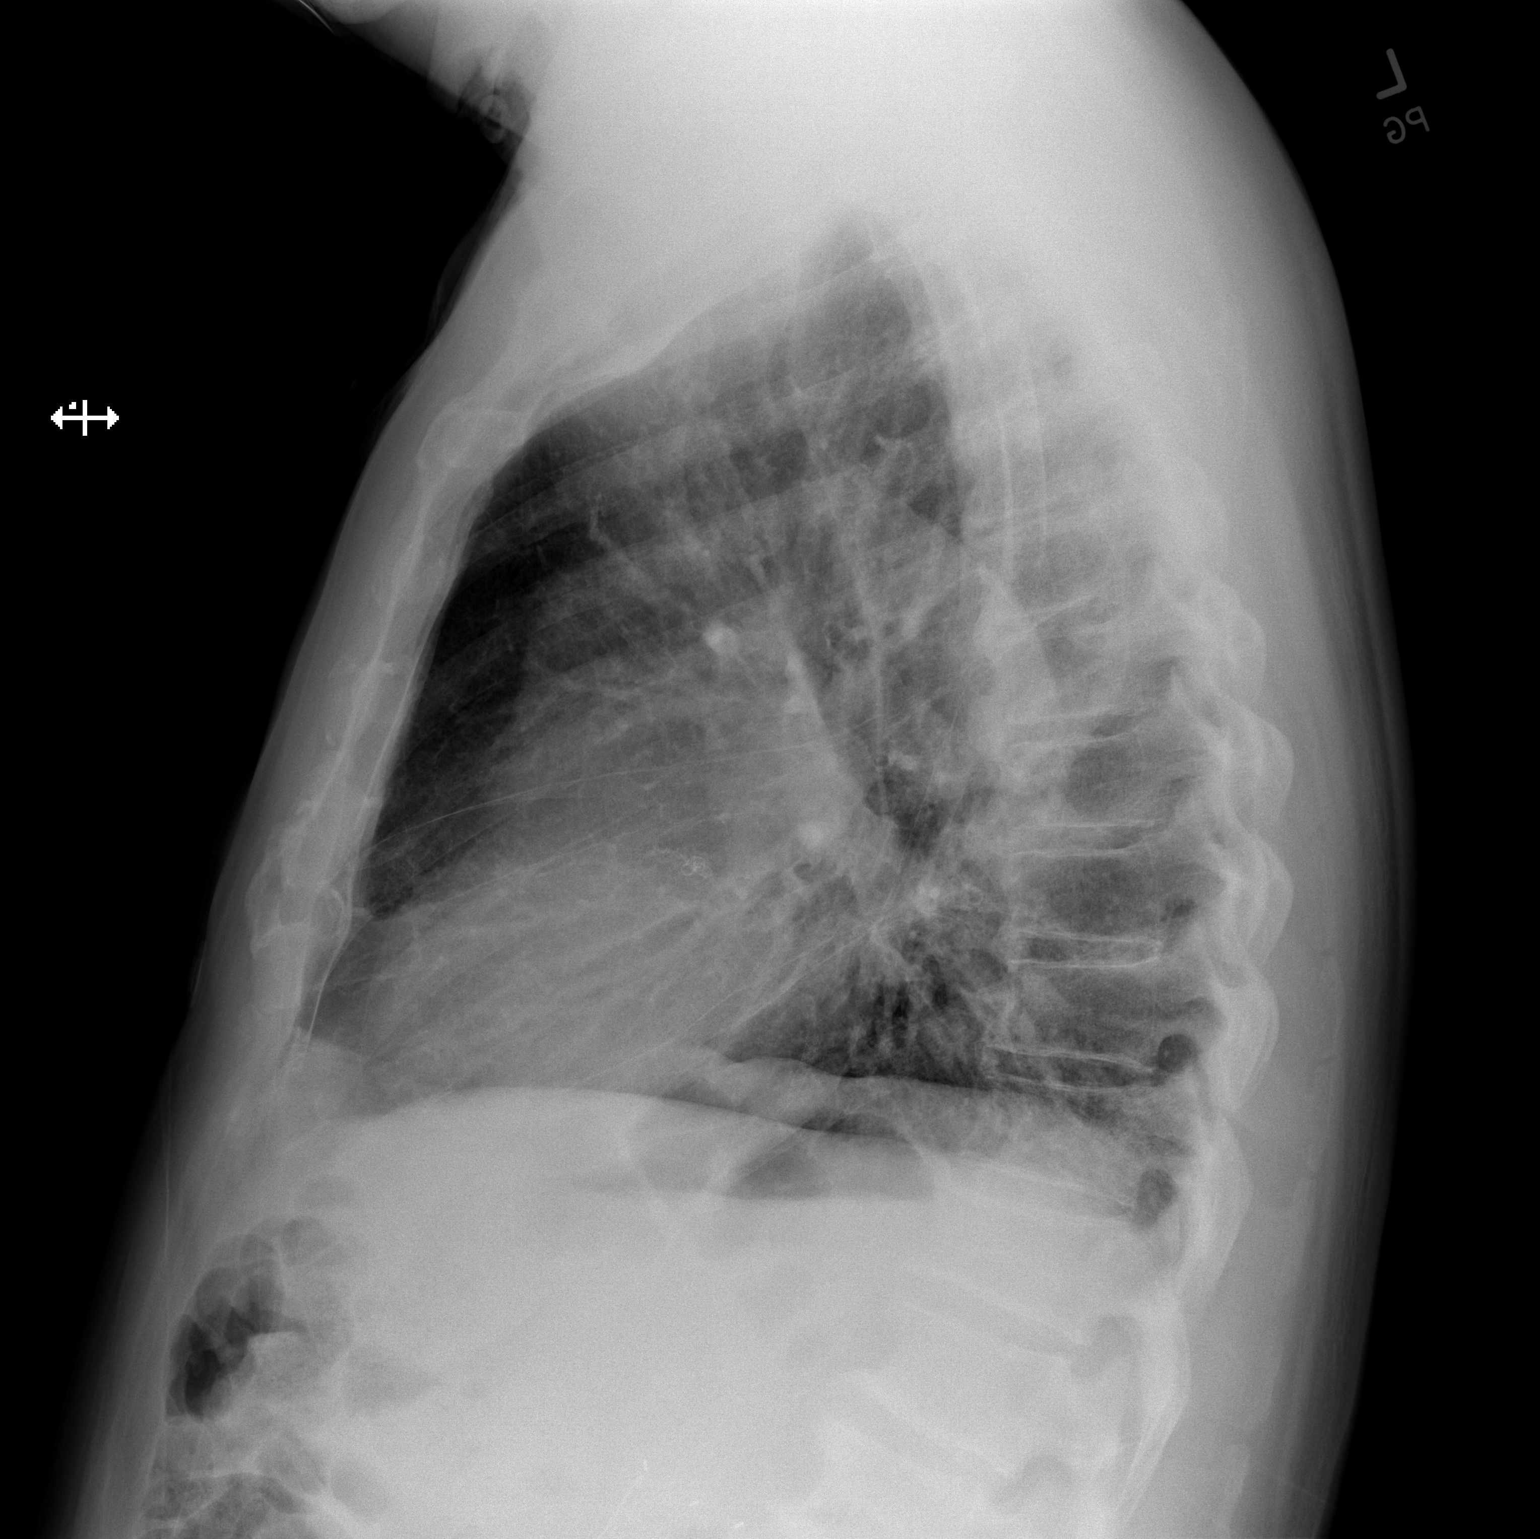

[2 of 2 positions shown; findings below may reference images not displayed]

FINDINGS: Heart size is normal. Tortuous aorta. The lungs are clear. The
vascularity is normal. No effusions. No significant bone finding.
IMPRESSION: No active cardiopulmonary disease.

## 2024-01-24 ENCOUNTER — Encounter: Payer: Self-pay | Admitting: Radiation Oncology

## 2024-01-24 ENCOUNTER — Encounter: Payer: Self-pay | Admitting: Hematology and Oncology

## 2024-03-29 NOTE — Progress Notes (Signed)
 Mr. Beza presents today in the clinic for a 6 month follow up. He was treated for Malignant Neoplasm of the glottis on 12/19/2022.  Patient says he still has fatigue  Pain issues, if any: Patient denies any throat pain, but does experience some muscle cramps in throat  Using a feeding tube?: Patient denies Weight changes, if any: Patient has picked up from 175 to 213 lbs Patient eats three meals a day: Breakfast, eggs, plant based sausage,potatoes, lunch; grapes supper: chicken or fish with vegetables Swallowing issues, if any: Patient denies Smoking or chewing tobacco? Patient denies Using fluoride toothpaste daily? Yes Last ENT visit was on: Unknown Other notable issues, if any:  None    BP 122/75 (BP Location: Right Arm, Patient Position: Sitting, Cuff Size: Large)   Pulse 63   Temp 97.9 F (36.6 C) (Oral)   Resp 17   Ht 6\' 2"  (1.88 m)   Wt 213 lb (96.6 kg)   SpO2 100%   BMI 27.35 kg/m   Wt Readings from Last 3 Encounters:  04/05/24 213 lb (96.6 kg)  10/18/23 211 lb (95.7 kg)  09/29/23 206 lb 3.2 oz (93.5 kg)

## 2024-04-05 ENCOUNTER — Other Ambulatory Visit: Payer: Self-pay

## 2024-04-05 ENCOUNTER — Encounter: Payer: Self-pay | Admitting: Radiation Oncology

## 2024-04-05 ENCOUNTER — Ambulatory Visit
Admission: RE | Admit: 2024-04-05 | Discharge: 2024-04-05 | Disposition: A | Discharge status: Home / Self Care | Payer: Self-pay | Source: Ambulatory Visit | Attending: Radiation Oncology | Admitting: Radiation Oncology

## 2024-04-05 ENCOUNTER — Ambulatory Visit
Admission: RE | Admit: 2024-04-05 | Discharge: 2024-04-05 | Disposition: A | Discharge status: Home / Self Care | Source: Ambulatory Visit | Attending: Radiation Oncology | Admitting: Radiation Oncology

## 2024-04-05 VITALS — BP 122/75 | HR 63 | Temp 97.9°F | Resp 17 | Ht 74.0 in | Wt 213.0 lb

## 2024-04-05 DIAGNOSIS — C320 Malignant neoplasm of glottis: Secondary | ICD-10-CM

## 2024-04-05 DIAGNOSIS — Z8521 Personal history of malignant neoplasm of larynx: Secondary | ICD-10-CM | POA: Insufficient documentation

## 2024-04-05 DIAGNOSIS — Z923 Personal history of irradiation: Secondary | ICD-10-CM | POA: Insufficient documentation

## 2024-04-05 DIAGNOSIS — Z08 Encounter for follow-up examination after completed treatment for malignant neoplasm: Secondary | ICD-10-CM | POA: Insufficient documentation

## 2024-04-05 LAB — TSH: TSH: 1.12 u[IU]/mL (ref 0.350–4.500)

## 2024-04-05 NOTE — Progress Notes (Addendum)
 Radiation Oncology         [phone] ________________________________  Name: CASHTON HOSLEY MRN: [id number]  Date: 04/05/2024  DOB: 1941-06-06  Follow-Up Visit Note  CC: Joanette Moynahan, MD  Janita Mellow, MD  Diagnosis and Prior Radiotherapy:       ICD-10-CM   1. 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 He completed treatment on 12/19/22.  First Treatment Date: 2022-10-26 - Last Treatment Date: 2022-12-19   Plan Name: HN_Larynx Site: Larynx Technique: IMRT Mode: Photon Dose Per Fraction: 2 Gy Prescribed Dose (Delivered / Prescribed): 70 Gy / 70 Gy Prescribed Fxs (Delivered / Prescribed): 35 / 35  Narrative:  Mr. Stief presents today in the clinic for a 6 month follow up. He was treated for Malignant Neoplasm of the glottis on 12/19/2022.  Patient says he still has fatigue  Pain issues, if any: Patient denies any throat pain, but does experience some muscle cramps in throat  Using a feeding tube?: Patient denies Weight changes, if any: Patient has picked up from 175 to 213 lbs Patient eats three meals a day: Breakfast, eggs, plant based sausage,potatoes, lunch; grapes supper: chicken or fish with vegetables Swallowing issues, if any: Patient denies Smoking or chewing tobacco? Patient denies Using fluoride toothpaste daily? Yes Last ENT visit was on: Donalee Fruits, Nov 2024 "Indirect exam of the larynx and hypopharynx looks normal. The cords move well. There is mild to moderate supraglottic edema but no mucosal lesions identified. No pooling of secretions. No palpable adenopathy."  Other notable issues, if any:   None    BP 122/75 (BP Location: Right Arm, Patient Position: Sitting, Cuff Size: Large)   Pulse 63   Temp 97.9 F (36.6 C) (Oral)   Resp 17   Ht 6\' 2"  (1.88 m)   Wt 213 lb (96.6 kg)   SpO2 100%   BMI 27.35 kg/m   Wt Readings from Last 3 Encounters:  04/05/24 213 lb (96.6 kg)  10/18/23 211 lb (95.7 kg)  09/29/23 206 lb 3.2 oz (93.5 kg)       ALLERGIES:  is allergic to lisinopril  and iohexol .  Meds: Current Outpatient Medications  Medication Sig Dispense Refill   amLODipine  (NORVASC ) 5 MG tablet Take 1 tablet (5 mg total) daily by mouth. 90 tablet 3   apixaban  (ELIQUIS ) 5 MG TABS tablet Take 1 tablet (5 mg total) by mouth 2 (two) times daily. 180 tablet 3   aspirin  EC 81 MG tablet Take 81 mg by mouth daily.     atorvastatin  (LIPITOR ) 80 MG tablet Take 1 tablet (80 mg total) by mouth daily. (Patient taking differently: Take 80 mg by mouth every evening.) 90 tablet 3   carvedilol  (COREG ) 6.25 MG tablet Take 1 tablet (6.25 mg total) by mouth 2 (two) times daily. 180 tablet 3   diltiazem  (CARDIZEM ) 30 MG tablet TAKE 1 TABLET BY MOUTH FOUR TIMES DAILY AS NEEDED FOR IRREGULAR HEART RATE GREATER THAN 100 AND BLOOD PRESSURE GREATER THAN 100 45 tablet 6   famotidine  (PEPCID ) 40 MG tablet Take 1 tablet (40 mg total) by mouth 2 (two) times daily.  180 tablet 2   fish oil-omega-3 fatty acids  1000 MG capsule Take 1,000 mg by mouth in the morning.     furosemide  (LASIX ) 20 MG tablet Take 20 mg by mouth in the morning.     irbesartan  (AVAPRO ) 300 MG tablet TAKE 1/2 TABLET(150 MG) BY MOUTH DAILY 90 tablet 3   isosorbide  mononitrate (IMDUR ) 60 MG 24 hr tablet TAKE 1 TABLET BY MOUTH EVERY DAY ( CAN NOT BE FILL 06.04.2018) 90 tablet 2   KLOR-CON  M10 10 MEQ tablet TAKE 1 TABLET BY MOUTH TWICE A DAY 180 tablet 3   loratadine  (CLARITIN ) 10 MG tablet Take 10 mg by mouth in the morning.     Multiple Vitamin (MULTIVITAMIN WITH MINERALS) TABS tablet Take 1 tablet by mouth daily.     nitroGLYCERIN   (NITROSTAT ) 0.4 MG SL tablet DISSOLVE 1 TABLET UNDER THE TONGUE EVERY 5 MINUTES AS NEEDED FOR CHEST PAIN( SHORTNESS OF BREATH) 25 tablet 3   omeprazole (PRILOSEC) 20 MG capsule Take 20 mg by mouth daily before breakfast.     diphenhydrAMINE  (BENADRYL  ALLERGY) 25 MG tablet Please take two(2) tablets 50 mg by mouth one(1) hour before scan. (Patient not taking: Reported on 04/05/2024) 30 tablet 0   No current facility-administered medications for this encounter.    Physical Findings: The patient is in no acute distress. Patient is alert and oriented. Wt Readings from Last 3 Encounters:  04/05/24 213 lb (96.6 kg)  10/18/23 211 lb (95.7 kg)  09/29/23 206 lb 3.2 oz (93.5 kg)    height is 6\' 2"  (1.88 m) and weight is 213 lb (96.6 kg). His oral temperature is 97.9 F (36.6 C). His blood pressure is 122/75 and his pulse is 63. His respiration is 17 and oxygen saturation is 100%. .  General: Alert and oriented, in no acute distress HEENT: Head is normocephalic. Extraocular movements are intact. Oropharynx is notable for no notable lesions or masses.  Neck: Neck is without masses Skin: Skin in treatment fields shows satisfactory healing around the neck.  Heart: Regular in rate and rhythm with no murmurs, rubs, or gallops. Chest: Clear to auscultation bilaterally, with no rhonchi, wheezes, or rales. Abdomen: Soft, nontender, nondistended, with no rigidity or guarding. Extremities: No cyanosis or edema. Lymphatics: see Neck Exam Psychiatric: Judgment and insight are intact. Affect is appropriate.  PROCEDURE NOTE: After obtaining consent and spraying nasal cavity with topical oxymetazoline , the flexible endoscope was coating in lidocaine  gel and introduced and passed through the nasal cavity.   The nasopharynx, oropharynx, hypopharynx, and larynx  were then examined. No lesions appreciated in the mucosal axis. The true cords were symmetrically mobile.    Lab Findings: Lab Results  Component Value  Date   WBC 2.6 (L) 06/14/2023   HGB 14.4 06/14/2023   HCT 40.6 06/14/2023   MCV 94.2 06/14/2023   PLT 138 (L) 06/14/2023    Lab Results  Component Value Date   TSH 1.273 09/29/2023    Radiographic Findings: No results found.  Impression/Plan:    1) Head and Neck Cancer Status: No evidence of disease  2) Nutritional Status: Weight has been stable to increased, no nutritional complaints.  Wt Readings from Last 3 Encounters:  04/05/24 213 lb (96.6 kg)  10/18/23 211 lb (95.7 kg)  09/29/23 206 lb 3.2 oz (93.5 kg)    PEG tube: N/A  3) Risk Factors: The patient has been educated about risk factors including alcohol and tobacco abuse; they understand that avoidance of alcohol and tobacco  is important to prevent recurrences as well as other cancers. Patient is not smoking.   4) Swallowing: Patient is eating and drinking a wide variety of foods.   5) Dental: Encouraged to continue regular followup with dentistry, and dental hygiene including fluoride rinses.  6) Thyroid  function: TSH is pending today - ordered for fatigue  Lab Results  Component Value Date   TSH 1.273 09/29/2023    7) Other:   Follow-up with me again in 6 months with TSH and neck/chest CT w contrast.  Continue to follow with ENT - Lynetta Saran will call Dr Regenia Cape office.  On date of service, in total, I spent 30 minutes on this encounter. Patient was seen in person.   -------------------    Colie Dawes, MD

## 2024-04-05 NOTE — Progress Notes (Signed)
 Oncology Nurse Navigator Documentation   I met with Rodney [name] today during his follow up appointment with Dr. Lurena Sally. He is doing well, recovering from his treatment for head and neck cancer. He will see Dr. Lurena Sally again in 6 months for results of a CT neck/chest done beforehand.   I also sent a fax to [facility] with request he be contacted and scheduled for routine post-RT follow-up with Dr. Donalee Fruits in 3 months.  Notification of successful fax transmission received.   Rodney [name] knows to call me directly if he has any questions or concerns in the future.   Lynetta Saran RN, BSN, OCN Head & Neck Oncology Nurse Navigator [facility] Phone # [phone]  Fax # [phone]

## 2024-04-07 ENCOUNTER — Other Ambulatory Visit: Payer: Self-pay

## 2024-05-22 ENCOUNTER — Other Ambulatory Visit: Payer: Self-pay

## 2024-07-24 ENCOUNTER — Other Ambulatory Visit: Payer: Self-pay | Admitting: Surgery

## 2024-07-24 DIAGNOSIS — I7121 Aneurysm of the ascending aorta, without rupture: Secondary | ICD-10-CM

## 2024-07-30 ENCOUNTER — Telehealth: Payer: Self-pay

## 2024-07-30 ENCOUNTER — Other Ambulatory Visit: Payer: Self-pay

## 2024-07-30 MED ORDER — PREDNISONE 50 MG PO TABS: ORAL_TABLET | ORAL | 0 refills | Status: AC

## 2024-07-30 MED ORDER — DIPHENHYDRAMINE HCL 50 MG PO TABS: 50.0000 mg | ORAL_TABLET | Freq: Once | ORAL | 0 refills | Status: AC

## 2024-07-30 NOTE — Telephone Encounter (Signed)
 Patient contacted and made aware of medication being sent into his preferred pharmacy due to a contrast allergy. Patient is scheduled to have a CT chest done before his appointment here in the office. Patient instructed how to take medication and he acknowledged receipt.

## 2024-08-28 ENCOUNTER — Ambulatory Visit (HOSPITAL_COMMUNITY)
Admission: RE | Admit: 2024-08-28 | Discharge: 2024-08-28 | Disposition: A | Discharge status: Home / Self Care | Source: Ambulatory Visit | Attending: Surgery | Admitting: Surgery

## 2024-08-28 DIAGNOSIS — I700 Atherosclerosis of aorta: Secondary | ICD-10-CM | POA: Insufficient documentation

## 2024-08-28 DIAGNOSIS — I2510 Atherosclerotic heart disease of native coronary artery without angina pectoris: Secondary | ICD-10-CM | POA: Insufficient documentation

## 2024-08-28 DIAGNOSIS — I7121 Aneurysm of the ascending aorta, without rupture: Secondary | ICD-10-CM | POA: Insufficient documentation

## 2024-08-28 MED ORDER — IOHEXOL 350 MG/ML SOLN
100.0000 mL | Freq: Once | INTRAVENOUS | Status: AC | PRN
  Administered 2024-08-28: 100 mL via INTRAVENOUS

## 2024-08-28 MED ORDER — DIPHENHYDRAMINE HCL 50 MG/ML IJ SOLN
50.0000 mg | Freq: Once | INTRAMUSCULAR | Status: AC
  Administered 2024-08-28: 50 mg via INTRAVENOUS

## 2024-09-03 NOTE — Progress Notes (Unsigned)
 [address] 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 Aneurysm measured 4.1 cm on CTA of chest.      Current Outpatient Medications on File Prior to Visit  Medication Sig Dispense Refill   amLODipine  (NORVASC ) 5 MG tablet Take 1 tablet (5 mg total) daily by mouth. 90 tablet 3   apixaban  (ELIQUIS ) 5 MG TABS tablet Take 1 tablet (5 mg total) by mouth 2 (two) times daily. 180 tablet 3   aspirin  EC 81 MG tablet Take 81 mg by mouth daily.     atorvastatin  (LIPITOR ) 80 MG tablet Take 1 tablet (80 mg total) by mouth daily. (Patient taking differently: Take 80 mg by mouth every evening.) 90 tablet 3   carvedilol  (COREG ) 6.25 MG tablet Take 1 tablet (6.25 mg total) by mouth 2 (two) times daily. 180 tablet 3   diltiazem  (CARDIZEM ) 30 MG tablet TAKE 1 TABLET BY MOUTH FOUR TIMES DAILY AS NEEDED FOR IRREGULAR HEART RATE GREATER THAN 100 AND BLOOD PRESSURE GREATER THAN 100 45 tablet 6   diphenhydrAMINE  (BENADRYL ) 50 MG tablet Take 1 tablet (50 mg total) by mouth once for 1 dose. Please take one tablet (50 mg) by mouth one hr before scan. 1 tablet 0   famotidine  (PEPCID ) 40 MG tablet Take 1 tablet (40 mg total) by mouth 2 (two) times daily. 180 tablet 2   fish oil-omega-3 fatty acids  1000 MG capsule Take 1,000 mg by mouth in the morning.     furosemide  (LASIX ) 20 MG tablet Take 20 mg by mouth in the morning.     irbesartan  (AVAPRO ) 300 MG tablet TAKE 1/2 TABLET(150 MG) BY MOUTH DAILY 90 tablet 3   isosorbide  mononitrate (IMDUR ) 60 MG 24 hr tablet TAKE 1 TABLET BY MOUTH EVERY DAY ( CAN NOT BE FILL 06.04.2018) 90 tablet 2    KLOR-CON  M10 10 MEQ tablet TAKE 1 TABLET BY MOUTH TWICE A DAY 180 tablet 3   loratadine  (CLARITIN ) 10 MG tablet Take 10 mg by mouth in the morning.     Multiple Vitamin (MULTIVITAMIN WITH MINERALS) TABS tablet Take 1 tablet by mouth daily.     nitroGLYCERIN  (NITROSTAT ) 0.4 MG SL tablet DISSOLVE 1 TABLET UNDER THE TONGUE EVERY 5 MINUTES AS NEEDED FOR CHEST PAIN( SHORTNESS OF BREATH) 25 tablet 3   omeprazole (PRILOSEC) 20 MG capsule Take 20 mg by mouth daily before breakfast.     predniSONE  (DELTASONE ) 50 MG tablet Please take one tablet (50 mg) by mouth 13 hrs, 7 hrs, and 1 hr before scan. 3 tablet 0   No current facility-administered medications on file prior to visit.     ROS: ROS   There were no vitals taken for this visit.  Physical Exam   Imaging: CLINICAL DATA:  Thoracic aortic aneurysm.   EXAM: CT ANGIOGRAPHY CHEST WITH CONTRAST   TECHNIQUE: Multidetector CT imaging of the chest was performed using the standard protocol during bolus administration of intravenous contrast. Multiplanar CT image reconstructions and MIPs were obtained to evaluate the vascular  anatomy.   RADIATION DOSE REDUCTION: This exam was performed according to the departmental dose-optimization program which includes automated exposure control, adjustment of the mA and/or kV according to patient size and/or use of iterative reconstruction technique.   CONTRAST:  OMNIPAQUE  IOHEXOL  350 MG/ML SOLN   COMPARISON:  September 11, 2023.   FINDINGS: Cardiovascular: Grossly stable 4.1 cm ascending thoracic aortic aneurysm is noted. No dissection is noted. Great vessels are widely patent without significant stenosis. Atherosclerosis of thoracic aorta is noted. Normal cardiac size. No pericardial effusion. Extensive coronary artery calcifications are noted suggesting coronary artery disease.   Mediastinum/Nodes: No enlarged mediastinal, hilar, or axillary lymph nodes. Thyroid  gland, trachea, and  esophagus demonstrate no significant findings.   Lungs/Pleura: No pneumothorax or pleural effusion is noted. Emphysematous disease is noted. No acute pulmonary disease is noted.   Upper Abdomen: No acute abnormality.   Musculoskeletal: No chest wall abnormality. No acute or significant osseous findings.   Review of the MIP images confirms the above findings.   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 Recommend annual imaging followup by CTA or MRA. This recommendation follows 2010 ACCF/AHA/AATS/ACR/ASA/SCA/SCAI/SIR/STS/SVM Guidelines for the Diagnosis and Management of Patients with Thoracic Aortic Disease. Circulation. 2010; 121: Z733-z630. Aortic aneurysm NOS (ICD10-I71.9).   Extensive coronary artery calcifications are noted suggesting coronary artery disease.   Aortic Atherosclerosis (ICD10-I70.0) and Emphysema (ICD10-J43.9).     Electronically Signed   By: Lynwood Landy Raddle M.D.   On: 08/28/2024 10:31     A/P: Aneurysm of ascending aorta without rupture -4.1 cm ascending thoracic aortic aneurysm on CTA of chest. We discussed the natural history and and risk factors for growth of ascending aortic aneurysms. Discussed recommendations to minimize the risk of further expansion or dissection including careful blood pressure control, avoidance of contact sports and heavy lifting, attention to lipid management.  We covered the importance of smoking ***cessation/staying never user.  The patient does not yet meet surgical criteria of >5.5cm. The patient is aware of signs and symptoms of aortic dissection and when to present to the emergency department     -Follow up in one year with CTA of chest    Risk Modification:  Statin:  atorvastatin   Smoking cessation instruction/counseling given:  {CHL AMB [facility] SMOKING CESSATION COUNSELING:20758}  Patient was counseled on importance of Blood Pressure Control  They are instructed to contact their Primary  Care Physician if they start to have blood pressure readings over 130s/90s. Do not ever stop blood pressure medications on your own, unless instructed by healthcare professional.  Please avoid use of Fluoroquinolones as this can potentially increase your risk of Aortic Rupture and/or Dissection  Patient educated on signs and symptoms of Aortic Dissection, handout also provided in AVS  Manuelita CHRISTELLA Rough, PA-C 09/03/24

## 2024-09-03 NOTE — Patient Instructions (Signed)

## 2024-09-04 ENCOUNTER — Ambulatory Visit

## 2024-09-04 VITALS — BP 149/78 | HR 60 | Resp 18 | Ht 74.0 in | Wt 217.0 lb

## 2024-09-04 DIAGNOSIS — I7121 Aneurysm of the ascending aorta, without rupture: Secondary | ICD-10-CM

## 2024-09-27 ENCOUNTER — Telehealth: Payer: Self-pay | Admitting: *Deleted

## 2024-09-27 NOTE — Telephone Encounter (Signed)
 Called patient to inform of Ct for 10-02-24- arrival time- 5:15 pm @ [facility], no restrictions to scan, patient to have lab and fu appt. with Dr. Izell on 10-11-24, spoke with patient and he is aware of these appts. and the instructions

## 2024-09-30 ENCOUNTER — Telehealth: Payer: Self-pay | Admitting: *Deleted

## 2024-09-30 NOTE — Telephone Encounter (Signed)
 Called patient to inform of Ct for 10-02-24- arrival time- 5:15 pm @ [facility], no restrictions to scan, patient to come for lab and fu for results on 10-11-24- 1:30 pm for labs and 2 pm for fu appt. with Dr. Izell for results, spoke with patient and he is aware of these appts. and the instructions

## 2024-10-02 ENCOUNTER — Ambulatory Visit (HOSPITAL_COMMUNITY)

## 2024-10-02 ENCOUNTER — Ambulatory Visit (HOSPITAL_COMMUNITY)
Admission: RE | Admit: 2024-10-02 | Discharge: 2024-10-02 | Disposition: A | Discharge status: Home / Self Care | Source: Ambulatory Visit | Attending: Radiation Oncology | Admitting: Radiation Oncology

## 2024-10-02 DIAGNOSIS — C320 Malignant neoplasm of glottis: Secondary | ICD-10-CM

## 2024-10-09 NOTE — Progress Notes (Incomplete)
 Pain issues, if any: *** Using a feeding tube?: *** Weight changes, if any: *** Swallowing issues, if any: *** Smoking or chewing tobacco? *** Using fluoride toothpaste daily? *** Last ENT visit was on: *** Other notable issues, if any: ***

## 2024-10-11 ENCOUNTER — Ambulatory Visit
Admission: RE | Admit: 2024-10-11 | Discharge: 2024-10-11 | Disposition: A | Discharge status: Home / Self Care | Payer: Self-pay | Source: Ambulatory Visit | Attending: Radiation Oncology | Admitting: Radiation Oncology

## 2024-10-11 ENCOUNTER — Ambulatory Visit
Admission: RE | Admit: 2024-10-11 | Discharge: 2024-10-11 | Disposition: A | Discharge status: Home / Self Care | Source: Ambulatory Visit | Attending: Radiation Oncology | Admitting: Radiation Oncology

## 2024-10-11 DIAGNOSIS — Z923 Personal history of irradiation: Secondary | ICD-10-CM | POA: Diagnosis not present

## 2024-10-11 DIAGNOSIS — Z7982 Long term (current) use of aspirin: Secondary | ICD-10-CM | POA: Insufficient documentation

## 2024-10-11 DIAGNOSIS — Z79899 Other long term (current) drug therapy: Secondary | ICD-10-CM | POA: Diagnosis not present

## 2024-10-11 DIAGNOSIS — Z7952 Long term (current) use of systemic steroids: Secondary | ICD-10-CM | POA: Insufficient documentation

## 2024-10-11 DIAGNOSIS — Z7901 Long term (current) use of anticoagulants: Secondary | ICD-10-CM | POA: Diagnosis not present

## 2024-10-11 DIAGNOSIS — C320 Malignant neoplasm of glottis: Secondary | ICD-10-CM | POA: Insufficient documentation

## 2024-10-11 LAB — TSH: TSH: 1.91 u[IU]/mL (ref 0.350–4.500)

## 2024-10-11 NOTE — Progress Notes (Signed)
 Radiation Oncology         [phone] ________________________________  Name: Rodney [name] MRN: [id number]  Date: 10/11/2024  DOB: Apr 22, 1941  Follow-Up Visit Note  CC: Rodney [name]  Rodney Monetta RAMAN*  Diagnosis and Prior Radiotherapy:       ICD-10-CM   1. 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 He completed treatment on 12/19/22.  First Treatment Date: 2022-10-26 - Last Treatment Date: 2022-12-19   Plan Name: HN_Larynx Site: Larynx Technique: IMRT Mode: Photon Dose Per Fraction: 2 Gy Prescribed Dose (Delivered / Prescribed): 70 Gy / 70 Gy Prescribed Fxs (Delivered / Prescribed): 35 / 35  Narrative:  [name] presents today in the clinic for a 6 month follow up. He was treated for Malignant Neoplasm of the glottis on 12/19/2022.   Pain issues, if any: Denies any throat pain or pain while swallowing. Has muscle cramps and pain that has been occurring for 6 months.  Neck tightness at times. Using a feeding tube?: Denies Weight changes, if any: Gained a few pounds since last year's follow up with Dr. Izell Swallowing issues, if any: Denies any issues swallowing food or drinking. Appetite is good, eats two meals a day  Smoking or chewing tobacco? Denies Using fluoride toothpaste daily? Yes Last ENT visit was on: 06/17/2024 with Dr. [name] Other notable issues, if any: doing well    BP (!) (P) 107/7 (BP Location: Left Arm, Patient Position: Sitting)   Pulse (!) (P) 56   Temp (!) (P) 96 F (35.6 C) (Temporal)   Resp (P) 18   Ht (P) 6' 2 (1.88 m)   Wt (P) 215 lb 4 oz (97.6 kg)   BMI (P) 27.64 kg/m    Wt Readings from Last 3 Encounters:  10/11/24 (P) 215 lb 4 oz (97.6 kg)  09/04/24 217 lb (98.4 kg)  04/05/24 213 lb (96.6 kg)    ALLERGIES:  is allergic to lisinopril , quinolones, and iohexol .  Meds: Current Outpatient Medications  Medication Sig Dispense Refill   amLODipine  (NORVASC ) 5 MG tablet Take 1 tablet (5 mg total) daily by mouth. 90 tablet 3   apixaban  (ELIQUIS ) 5 MG TABS tablet Take 1 tablet (5 mg total) by mouth 2 (two) times daily. 180 tablet 3   aspirin  EC 81 MG tablet Take 81 mg by mouth daily.     atorvastatin  (LIPITOR ) 80 MG tablet Take 1 tablet (80 mg total) by mouth daily. (Patient taking differently: Take 80 mg by mouth every evening.) 90 tablet 3   carvedilol  (COREG ) 6.25 MG tablet Take 1 tablet (6.25 mg total) by mouth 2 (two) times daily. 180 tablet 3   diltiazem  (CARDIZEM ) 30 MG tablet TAKE 1 TABLET BY MOUTH FOUR TIMES DAILY AS NEEDED FOR IRREGULAR HEART RATE GREATER THAN 100 AND BLOOD PRESSURE GREATER THAN 100 45 tablet 6   diphenhydrAMINE  (BENADRYL ) 50 MG tablet Take 1 tablet (50 mg total) by mouth once for 1 dose. Please take one tablet (50 mg) by mouth one hr before scan. 1 tablet 0   famotidine  (PEPCID ) 40 MG tablet Take 1 tablet (40 mg total) by  mouth 2 (two) times daily. 180 tablet 2   fish oil-omega-3 fatty acids  1000 MG capsule Take 1,000 mg by mouth in the morning.     furosemide  (LASIX ) 20 MG tablet Take 20 mg by mouth in the morning.     irbesartan  (AVAPRO ) 300 MG tablet TAKE 1/2 TABLET(150 MG) BY MOUTH DAILY 90 tablet 3   isosorbide  mononitrate (IMDUR ) 60 MG 24 hr tablet TAKE 1 TABLET BY MOUTH EVERY DAY ( CAN NOT BE FILL 06.04.2018) 90 tablet 2   KLOR-CON  M10 10 MEQ tablet TAKE 1 TABLET BY MOUTH TWICE A DAY 180 tablet 3   loratadine  (CLARITIN ) 10 MG tablet Take 10 mg by mouth in the morning.     Multiple Vitamin (MULTIVITAMIN WITH MINERALS) TABS tablet Take 1 tablet by mouth daily.     nitroGLYCERIN  (NITROSTAT ) 0.4 MG SL tablet DISSOLVE 1 TABLET UNDER  THE TONGUE EVERY 5 MINUTES AS NEEDED FOR CHEST PAIN( SHORTNESS OF BREATH) 25 tablet 3   omeprazole (PRILOSEC) 20 MG capsule Take 20 mg by mouth daily before breakfast.     predniSONE  (DELTASONE ) 50 MG tablet Please take one tablet (50 mg) by mouth 13 hrs, 7 hrs, and 1 hr before scan. 3 tablet 0   No current facility-administered medications for this encounter.    Physical Findings: The patient is in no acute distress. Patient is alert and oriented. Wt Readings from Last 3 Encounters:  10/11/24 (P) 215 lb 4 oz (97.6 kg)  09/04/24 217 lb (98.4 kg)  04/05/24 213 lb (96.6 kg)    height is 6' 2 (1.88 m) (pended) and weight is 215 lb 4 oz (97.6 kg) (pended). His temporal temperature is 96 F (35.6 C) (abnormal, pended). His blood pressure is 107/7 (abnormal, pended) and his pulse is 56 (abnormal, pended). His respiration is 18 (pended). .  General: Alert and oriented, in no acute distress HEENT: Head is normocephalic. Extraocular movements are intact. Oropharynx is notable for no notable lesions or masses.  Neck: Neck is without masses Skin: Skin in treatment fields shows satisfactory healing around the neck.  Heart: Regular in rate and rhythm with no murmurs, rubs, or gallops. Chest: Clear to auscultation bilaterally, with no rhonchi, wheezes, or rales. Abdomen: Soft, nontender, nondistended, with no rigidity or guarding. Extremities: No cyanosis or edema. Lymphatics: see Neck Exam Psychiatric: Judgment and insight are intact. Affect is appropriate.    LARYNGOSCOPY PROCEDURE NOTE: After obtaining consent and spraying nasal cavity with topical lidocaine  and oxymetazoline , the flexible endoscope was coated with lidocaine  gel and introduced and passed through the nasal cavity.  The nasopharynx, oropharynx, hypopharynx, and larynx were then examined. No lesions appreciated in the mucosal axis.  The true cords were symmetrically mobile.  The patient tolerated the procedure well.   Lab  Findings: Lab Results  Component Value Date   WBC 2.6 (L) 06/14/2023   HGB 14.4 06/14/2023   HCT 40.6 06/14/2023   MCV 94.2 06/14/2023   PLT 138 (L) 06/14/2023    Lab Results  Component Value Date   TSH 1.120 04/05/2024    Radiographic Findings: CT Soft Tissue Neck Wo Contrast Result Date: 10/03/2024 CLINICAL DATA:  Malignant glottic neoplasm EXAM: CT NECK WITH CONTRAST TECHNIQUE: Multidetector CT imaging of the neck was performed using the standard protocol following the bolus administration of intravenous contrast. RADIATION DOSE REDUCTION: This exam was performed according to the departmental dose-optimization program which includes automated exposure control, adjustment of the mA and/or kV according to patient size and/or  use of iterative reconstruction technique. CONTRAST:  None COMPARISON:  August 18, 2022, March 16, 2023 CT PET FINDINGS: Pharynx: The nasopharynx, oropharynx and hypopharynx are normal Oral cavity/floor of mouth: Normal Larynx: Normal Salivary glands: Treatment related atrophy of the submandibular glands. The parotid glands are normal Thyroid : Normal Lymph nodes: No adenopathy Vascular: No significant abnormality Limited intracranial: No significant abnormality Visualized orbits: No significant abnormality Mastoids and visualized paranasal sinuses: No significant abnormality Skeleton: Cervical spondylosis Upper chest: No significant abnormality Other: None IMPRESSION: No abnormal mass or adenopathy Electronically Signed   By: Nancyann Burns M.D.   On: 10/03/2024 11:37    Impression/Plan:    1) Head and Neck Cancer Status: No evidence of disease on exam or imaging - SEE LARYNGOSCOPY PROCEDURE NOTE  2) Nutritional Status: Weight has been stable  Wt Readings from Last 3 Encounters:  10/11/24 (P) 215 lb 4 oz (97.6 kg)  09/04/24 217 lb (98.4 kg)  04/05/24 213 lb (96.6 kg)    PEG tube: N/A  3) Risk Factors: The patient has been educated about risk factors including  alcohol and tobacco abuse; they understand that avoidance of alcohol and tobacco is important to prevent recurrences as well as other cancers. Patient is not smoking.   4) Swallowing: Patient is eating and drinking a wide variety of foods.   5) Dental: Encouraged to continue regular followup with dentistry, and dental hygiene including fluoride rinses.  6) Thyroid  function: TSH is pending today   Lab Results  Component Value Date   TSH 1.120 04/05/2024    7) Other:   Follow-up with rad onc again in 6 months for physical exam/scope.  Would be reasonable to obtain another set of CT imaging of neck/chest with contrast 6 mo thereafter.   On date of service, in total, I spent 40 minutes on this encounter. Patient was seen in person.   -------------------    Lauraine Golden, [name]

## 2024-10-12 ENCOUNTER — Other Ambulatory Visit: Payer: Self-pay

## 2024-10-14 NOTE — Progress Notes (Signed)
 Oncology Nurse Navigator Documentation   Per patient's 10/11/24 post-treatment follow-up with Dr. Izell, sent fax to [facility] Scheduling with request Mr. Kossman be contacted and scheduled for routine post-RT follow-up with Dr. [name] in 3 months.  Notification of successful fax transmission received.   Delon Jefferson RN, BSN, OCN Head & Neck Oncology Nurse Navigator [facility] Phone # [phone]  Fax # [phone]

## 2024-10-15 ENCOUNTER — Encounter: Payer: Self-pay | Admitting: Hematology and Oncology

## 2024-10-15 ENCOUNTER — Encounter: Payer: Self-pay | Admitting: Radiation Oncology

## 2024-11-25 NOTE — Progress Notes (Unsigned)
°  Electrophysiology Office Follow up Visit Note:    Date:  11/28/2024   ID:  Rodney [name], DOB [birth year], MRN [id number]  PCP:  [name], MD  [facility] Cardiologist:  None  [facility] Electrophysiologist:  Will Gladis Norton, MD    Interval History:     Rodney [name] is a 83 y.o. male who presents for a follow up visit.   The patient last saw [facility] 18, 2023.  He has a history of coronary artery disease, hypertension, hyperlipidemia, AAA, atrial fibrillation, sleep apnea and chronic systolic heart failure.  He is on Eliquis  for stroke prophylaxis.  He also takes Coreg  and diltiazem .  He is doing well today.  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  He receives most of his cancer care at the [facility].      Past medical, surgical, social and family history were reviewed.  ROS:   Please see the history of present illness.    All other systems reviewed and are negative.  EKGs/Labs/Other Studies Reviewed:    The following studies were reviewed today:          Physical Exam:    VS:  BP 130/70 (BP Location: Left Arm, Patient Position: Sitting, Cuff Size: Large)   Pulse 62   Ht 6' 2 (1.88 m)   Wt 218 lb (98.9 kg)   SpO2 97%   BMI 27.99 kg/m     Wt Readings from Last 3 Encounters:  11/28/24 218 lb (98.9 kg)  10/11/24 (P) 215 lb 4 oz (97.6 kg)  09/04/24 217 lb (98.4 kg)     GEN: no distress CARD: RRR, No MRG RESP: No IWOB. CTAB.      ASSESSMENT:    No diagnosis found. PLAN:    In order of problems listed above:  #Paroxysmal atrial fibrillation Low burden of A-fib Continue Eliquis  Continue Coreg  and diltiazem   #Hypertension At goal today.  Recommend checking blood pressures 1-2 times per week at home and recording the values.  Recommend bringing these recordings to the primary care physician.  I discussed my upcoming departure from [facility] during today's clinic appointment.  The patient will continue to  follow-up with one of my EP partners moving forward.  Follow-up with EP APP in 1 year   Signed, Ole Holts, MD, [facility], [facility] 11/28/2024 9:21 AM    Electrophysiology  [facility]

## 2024-11-28 ENCOUNTER — Encounter: Payer: Self-pay | Admitting: Cardiology

## 2024-11-28 ENCOUNTER — Ambulatory Visit: Attending: Cardiovascular Disease | Admitting: Cardiology

## 2024-11-28 VITALS — BP 130/70 | HR 62 | Ht 74.0 in | Wt 218.0 lb

## 2024-11-28 DIAGNOSIS — I480 Paroxysmal atrial fibrillation: Secondary | ICD-10-CM

## 2024-11-28 DIAGNOSIS — I10 Essential (primary) hypertension: Secondary | ICD-10-CM | POA: Diagnosis not present

## 2024-11-28 NOTE — Patient Instructions (Signed)

## 2025-04-01 ENCOUNTER — Ambulatory Visit: Admitting: Radiology
